# Patient Record
Sex: Male | Born: 1940 | Race: White | Hispanic: No | Marital: Single | Smoking: Former smoker
Health system: Southern US, Community
[De-identification: ages and names within clinical notes are randomized; demographics above are authoritative.]

## PROBLEM LIST (undated history)

## (undated) DIAGNOSIS — R0689 Other abnormalities of breathing: Secondary | ICD-10-CM

## (undated) DIAGNOSIS — I5032 Chronic diastolic (congestive) heart failure: Secondary | ICD-10-CM

## (undated) DIAGNOSIS — I482 Chronic atrial fibrillation, unspecified: Secondary | ICD-10-CM

## (undated) DIAGNOSIS — K219 Gastro-esophageal reflux disease without esophagitis: Secondary | ICD-10-CM

## (undated) DIAGNOSIS — E119 Type 2 diabetes mellitus without complications: Secondary | ICD-10-CM

## (undated) DIAGNOSIS — N183 Chronic kidney disease, stage 3 unspecified: Secondary | ICD-10-CM

## (undated) DIAGNOSIS — I2510 Atherosclerotic heart disease of native coronary artery without angina pectoris: Secondary | ICD-10-CM

## (undated) DIAGNOSIS — I119 Hypertensive heart disease without heart failure: Secondary | ICD-10-CM

## (undated) DIAGNOSIS — N200 Calculus of kidney: Secondary | ICD-10-CM

## (undated) DIAGNOSIS — M1990 Unspecified osteoarthritis, unspecified site: Secondary | ICD-10-CM

## (undated) DIAGNOSIS — K7682 Hepatic encephalopathy: Secondary | ICD-10-CM

## (undated) DIAGNOSIS — E785 Hyperlipidemia, unspecified: Secondary | ICD-10-CM

## (undated) DIAGNOSIS — E8729 Other acidosis: Secondary | ICD-10-CM

## (undated) DIAGNOSIS — K7290 Hepatic failure, unspecified without coma: Secondary | ICD-10-CM

## (undated) DIAGNOSIS — E872 Acidosis: Secondary | ICD-10-CM

## (undated) DIAGNOSIS — J9611 Chronic respiratory failure with hypoxia: Secondary | ICD-10-CM

## (undated) HISTORY — DX: Other abnormalities of breathing: R06.89

## (undated) HISTORY — DX: Chronic diastolic (congestive) heart failure: I50.32

## (undated) HISTORY — DX: Hepatic encephalopathy: K76.82

## (undated) HISTORY — DX: Gastro-esophageal reflux disease without esophagitis: K21.9

## (undated) HISTORY — DX: Hepatic failure, unspecified without coma: K72.90

## (undated) HISTORY — DX: Hyperlipidemia, unspecified: E78.5

## (undated) HISTORY — DX: Acidosis: E87.2

## (undated) HISTORY — DX: Atherosclerotic heart disease of native coronary artery without angina pectoris: I25.10

## (undated) HISTORY — DX: Unspecified osteoarthritis, unspecified site: M19.90

## (undated) HISTORY — PX: OTHER SURGICAL HISTORY: SHX169

## (undated) HISTORY — DX: Other acidosis: E87.29

---

## 2007-02-26 ENCOUNTER — Emergency Department (HOSPITAL_COMMUNITY): Admission: EM | Admit: 2007-02-26 | Discharge: 2007-02-26 | Payer: Self-pay | Admitting: Emergency Medicine

## 2007-07-12 HISTORY — PX: CORONARY ARTERY BYPASS GRAFT: SHX141

## 2007-07-12 HISTORY — PX: CARDIAC CATHETERIZATION: SHX172

## 2007-09-07 ENCOUNTER — Encounter: Payer: Self-pay | Admitting: Cardiovascular Disease

## 2007-09-11 ENCOUNTER — Encounter: Payer: Self-pay | Admitting: Cardiovascular Disease

## 2007-09-19 ENCOUNTER — Ambulatory Visit: Payer: Self-pay | Admitting: Cardiovascular Disease

## 2007-09-19 ENCOUNTER — Encounter: Payer: Self-pay | Admitting: Cardiovascular Disease

## 2009-02-04 ENCOUNTER — Encounter: Payer: Self-pay | Admitting: Cardiovascular Disease

## 2009-02-19 ENCOUNTER — Ambulatory Visit: Payer: Self-pay | Admitting: Urology

## 2009-07-11 HISTORY — PX: COLONOSCOPY: SHX174

## 2009-12-31 ENCOUNTER — Ambulatory Visit: Payer: Self-pay | Admitting: Gastroenterology

## 2010-02-03 ENCOUNTER — Ambulatory Visit: Payer: Self-pay | Admitting: Gastroenterology

## 2010-03-03 ENCOUNTER — Ambulatory Visit: Payer: Self-pay | Admitting: Cardiovascular Disease

## 2010-03-03 DIAGNOSIS — I10 Essential (primary) hypertension: Secondary | ICD-10-CM

## 2010-03-03 DIAGNOSIS — I4891 Unspecified atrial fibrillation: Secondary | ICD-10-CM | POA: Insufficient documentation

## 2010-03-03 DIAGNOSIS — I25810 Atherosclerosis of coronary artery bypass graft(s) without angina pectoris: Secondary | ICD-10-CM

## 2010-03-03 DIAGNOSIS — E785 Hyperlipidemia, unspecified: Secondary | ICD-10-CM

## 2010-03-09 ENCOUNTER — Telehealth: Payer: Self-pay | Admitting: Cardiovascular Disease

## 2010-03-10 ENCOUNTER — Encounter: Payer: Self-pay | Admitting: Cardiovascular Disease

## 2010-03-10 ENCOUNTER — Ambulatory Visit: Payer: Self-pay | Admitting: Cardiology

## 2010-03-10 LAB — CONVERTED CEMR LAB: POC INR: 3.2

## 2010-03-18 ENCOUNTER — Ambulatory Visit: Payer: Self-pay | Admitting: Cardiovascular Disease

## 2010-03-18 DIAGNOSIS — R0602 Shortness of breath: Secondary | ICD-10-CM

## 2010-03-18 LAB — CONVERTED CEMR LAB: POC INR: 6.6

## 2010-03-23 ENCOUNTER — Telehealth: Payer: Self-pay | Admitting: Cardiovascular Disease

## 2010-03-29 ENCOUNTER — Ambulatory Visit: Payer: Self-pay | Admitting: Cardiovascular Disease

## 2010-03-30 ENCOUNTER — Ambulatory Visit: Payer: Self-pay

## 2010-03-30 ENCOUNTER — Encounter: Payer: Self-pay | Admitting: Cardiovascular Disease

## 2010-04-07 ENCOUNTER — Ambulatory Visit: Payer: Self-pay | Admitting: Cardiology

## 2010-04-12 ENCOUNTER — Ambulatory Visit: Payer: Self-pay | Admitting: Cardiovascular Disease

## 2010-04-12 DIAGNOSIS — R609 Edema, unspecified: Secondary | ICD-10-CM | POA: Insufficient documentation

## 2010-04-12 LAB — CONVERTED CEMR LAB: POC INR: 3.8

## 2010-04-21 ENCOUNTER — Ambulatory Visit: Payer: Self-pay | Admitting: Cardiovascular Disease

## 2010-04-23 LAB — CONVERTED CEMR LAB
BUN: 12 mg/dL (ref 6–23)
CO2: 28 meq/L (ref 19–32)
Chloride: 100 meq/L (ref 96–112)
Creatinine, Ser: 1.24 mg/dL (ref 0.40–1.50)
Glucose, Bld: 102 mg/dL — ABNORMAL HIGH (ref 70–99)

## 2010-04-27 ENCOUNTER — Telehealth: Payer: Self-pay | Admitting: Cardiovascular Disease

## 2010-04-28 ENCOUNTER — Ambulatory Visit: Payer: Self-pay | Admitting: Internal Medicine

## 2010-04-30 ENCOUNTER — Encounter: Payer: Self-pay | Admitting: Cardiovascular Disease

## 2010-05-05 ENCOUNTER — Ambulatory Visit: Payer: Self-pay | Admitting: Cardiovascular Disease

## 2010-05-06 ENCOUNTER — Telehealth: Payer: Self-pay | Admitting: Cardiovascular Disease

## 2010-05-07 ENCOUNTER — Ambulatory Visit: Payer: Self-pay | Admitting: Cardiovascular Disease

## 2010-05-07 ENCOUNTER — Encounter: Payer: Self-pay | Admitting: Cardiovascular Disease

## 2010-05-07 ENCOUNTER — Telehealth: Payer: Self-pay | Admitting: Cardiovascular Disease

## 2010-05-11 ENCOUNTER — Encounter: Payer: Self-pay | Admitting: Cardiovascular Disease

## 2010-05-12 ENCOUNTER — Ambulatory Visit: Payer: Self-pay | Admitting: Cardiovascular Disease

## 2010-05-13 ENCOUNTER — Encounter: Payer: Self-pay | Admitting: Cardiovascular Disease

## 2010-05-19 ENCOUNTER — Ambulatory Visit: Payer: Self-pay | Admitting: Cardiology

## 2010-05-19 DIAGNOSIS — I501 Left ventricular failure: Secondary | ICD-10-CM

## 2010-06-01 ENCOUNTER — Encounter: Payer: Self-pay | Admitting: Cardiovascular Disease

## 2010-06-01 ENCOUNTER — Ambulatory Visit: Payer: Self-pay | Admitting: Cardiology

## 2010-06-02 ENCOUNTER — Ambulatory Visit: Payer: Self-pay | Admitting: Cardiovascular Disease

## 2010-06-02 LAB — CONVERTED CEMR LAB
BUN: 20 mg/dL (ref 6–23)
CO2: 35 meq/L — ABNORMAL HIGH (ref 19–32)
Glucose, Bld: 125 mg/dL — ABNORMAL HIGH (ref 70–99)
Potassium: 4.6 meq/L (ref 3.5–5.3)
Pro B Natriuretic peptide (BNP): 83.7 pg/mL (ref 0.0–100.0)
Sodium: 140 meq/L (ref 135–145)

## 2010-06-08 ENCOUNTER — Encounter: Payer: Self-pay | Admitting: Cardiology

## 2010-06-08 ENCOUNTER — Ambulatory Visit: Payer: Self-pay

## 2010-06-16 ENCOUNTER — Ambulatory Visit: Payer: Self-pay | Admitting: Cardiovascular Disease

## 2010-06-29 ENCOUNTER — Encounter: Payer: Self-pay | Admitting: Cardiovascular Disease

## 2010-06-30 ENCOUNTER — Ambulatory Visit: Payer: Self-pay | Admitting: Cardiovascular Disease

## 2010-06-30 LAB — CONVERTED CEMR LAB: POC INR: 1.7

## 2010-07-14 ENCOUNTER — Ambulatory Visit: Admission: RE | Admit: 2010-07-14 | Discharge: 2010-07-14 | Payer: Self-pay | Source: Home / Self Care

## 2010-07-14 ENCOUNTER — Ambulatory Visit
Admission: RE | Admit: 2010-07-14 | Discharge: 2010-07-14 | Payer: Self-pay | Source: Home / Self Care | Attending: Cardiovascular Disease | Admitting: Cardiovascular Disease

## 2010-07-14 LAB — CONVERTED CEMR LAB: POC INR: 1.3

## 2010-07-21 ENCOUNTER — Ambulatory Visit: Admission: RE | Admit: 2010-07-21 | Discharge: 2010-07-21 | Payer: Self-pay | Source: Home / Self Care

## 2010-07-30 ENCOUNTER — Telehealth (INDEPENDENT_AMBULATORY_CARE_PROVIDER_SITE_OTHER): Payer: Self-pay | Admitting: *Deleted

## 2010-08-04 ENCOUNTER — Telehealth: Payer: Self-pay | Admitting: Cardiovascular Disease

## 2010-08-04 ENCOUNTER — Encounter: Payer: Self-pay | Admitting: Cardiovascular Disease

## 2010-08-04 ENCOUNTER — Ambulatory Visit: Admission: RE | Admit: 2010-08-04 | Discharge: 2010-08-04 | Payer: Self-pay | Source: Home / Self Care

## 2010-08-04 LAB — CONVERTED CEMR LAB: POC INR: 3.4

## 2010-08-08 LAB — CONVERTED CEMR LAB
BUN: 17 mg/dL (ref 6–23)
Basophils Relative: 0 % (ref 0–1)
CO2: 32 meq/L (ref 19–32)
Chloride: 99 meq/L (ref 96–112)
Creatinine, Ser: 1.22 mg/dL (ref 0.40–1.50)
Eosinophils Absolute: 0.2 10*3/uL (ref 0.0–0.7)
HCT: 39.3 % (ref 39.0–52.0)
Hemoglobin: 11.5 g/dL — ABNORMAL LOW (ref 13.0–17.0)
Lymphs Abs: 0.9 10*3/uL (ref 0.7–4.0)
MCHC: 29.3 g/dL — ABNORMAL LOW (ref 30.0–36.0)
MCV: 81.5 fL (ref 78.0–100.0)
Monocytes Absolute: 0.8 10*3/uL (ref 0.1–1.0)
Monocytes Relative: 10 % (ref 3–12)
Potassium: 4.5 meq/L (ref 3.5–5.3)
RBC: 4.82 M/uL (ref 4.22–5.81)
WBC: 8 10*3/uL (ref 4.0–10.5)
aPTT: 42 s — ABNORMAL HIGH (ref 24–37)

## 2010-08-12 NOTE — Medication Information (Signed)
Summary: rov/ewj  Anticoagulant Therapy  Managed by: Cloyde Reams, RN, BSN Referring MD: Dr Mariah Milling PCP: Dr. Tommie Sams MD: Shirlee Latch MD, Freida Busman Indication 1: atrial Fibrillation 427.31 Lab Used: [facility] [place] Site: [place] INR POC 6.6 INR RANGE 2.0-3.0  Dietary changes: no     Bleeding/hemorrhagic complications: no     Any changes in medication regimen? no     Any missed doses?: no       Is patient compliant with meds? yes       Allergies: No Known Drug Allergies  Anticoagulation Management History:      The patient is taking warfarin and comes in today for a routine follow up visit.  Positive risk factors for bleeding include an age of 69 years or older.  The bleeding index is 'intermediate risk'.  Positive CHADS2 values include History of HTN.  Negative CHADS2 values include Age > 38 years old.  Anticoagulation responsible provider: Shirlee Latch MD, [place].  INR POC: 6.6.  Exp: 04/2011.    Anticoagulation Management Assessment/Plan:      The patient's current anticoagulation dose is Warfarin sodium 5 mg tabs: Use as directed by Anticoagulation Clinic.  The target INR is 2.0-3.0.  The next INR is due 03/29/2010.  Results were reviewed/authorized by Cloyde Reams, RN, BSN.  He was notified by Benedict Needy, RN.         Prior Anticoagulation Instructions: INR 3.2  Skip tomorrow's dosage of Coumadin, then start taking 1 tablet daily except 1/2 tablet on Mondays and Fridays.  Recheck in 1 week.    Current Anticoagulation Instructions: INR 6.6  DO NOT TAKE COUMADIN UNTIL MONDAY. Start taking 1/2 tab daily except for 1 tab on Tuesday and Saturday. Recheck in 10 days.

## 2010-08-12 NOTE — Medication Information (Signed)
Summary: ccr/ekg/alt  Anticoagulant Therapy  Managed by: Cloyde Reams, RN, BSN Referring MD: Dr Mariah Milling PCP: Dr. Tommie Sams MD: Shirlee Latch MD, Freida Busman Indication 1: atrial Fibrillation 427.31 Lab Used: [facility] [place] Site: [place] INR POC 3.2 INR RANGE 2.0-3.0          Comments: Started on Coumadin 5mg  daily on 03/03/10.    Allergies: No Known Drug Allergies  Anticoagulation Management History:      The patient is taking warfarin and comes in today for a routine follow up visit.  Positive risk factors for bleeding include an age of 76 years or older.  The bleeding index is 'intermediate risk'.  Positive CHADS2 values include History of HTN.  Negative CHADS2 values include Age > 72 years old.  Anticoagulation responsible provider: Shirlee Latch MD, [place].  INR POC: 3.2.  Cuvette Lot#: [id number].  Exp: 04/2011.    Anticoagulation Management Assessment/Plan:      The patient's current anticoagulation dose is Warfarin sodium 5 mg tabs: Use as directed by Anticoagulation Clinic.  The target INR is 2.0-3.0.  The next INR is due 03/18/2010.  Results were reviewed/authorized by Cloyde Reams, RN, BSN.  He was notified by Cloyde Reams RN.         Current Anticoagulation Instructions: INR 3.2  Skip tomorrow's dosage of Coumadin, then start taking 1 tablet daily except 1/2 tablet on Mondays and Fridays.  Recheck in 1 week.

## 2010-08-12 NOTE — Medication Information (Signed)
Summary: rov/ewj  Anticoagulant Therapy  Managed by: Inactive Referring MD: Dr Mariah Milling PCP: Dr. Glenis Smoker Supervising MD: Gala Romney MD, Reuel Boom Indication 1: atrial Fibrillation 427.31 Lab Used: [facility] [place] Site: [place] INR POC 3.4 INR RANGE 2.0-3.0  Dietary changes: no    Health status changes: no    Bleeding/hemorrhagic complications: no    Recent/future hospitalizations: no    Any changes in medication regimen? no    Recent/future dental: no  Any missed doses?: no       Is patient compliant with meds? yes      Comments: Pt had EKG done in office today, pt is in NSR.  OK per Dr Mariah Milling to discontinue Coumadin if pt is in NSR.  Pt advised to stop Coumadin.   Allergies: No Known Drug Allergies  Anticoagulation Management History:      The patient is taking warfarin and comes in today for a routine follow up visit.  Positive risk factors for bleeding include an age of 45 years or older.  The bleeding index is 'intermediate risk'.  Positive CHADS2 values include History of CHF and History of HTN.  Negative CHADS2 values include Age > 34 years old.  His last INR was 2.46.  Anticoagulation responsible provider: Libby Goehring MD, Reuel Boom.  INR POC: 3.4.  Cuvette Lot#: [id number].  Exp: 08/2011.    Anticoagulation Management Assessment/Plan:      The patient's current anticoagulation dose is Warfarin sodium 5 mg tabs: Use as directed by Anticoagulation Clinic, Warfarin sodium 2.5 mg tabs: Use as directed by Anticoagualtion Clinic.  The target INR is 2.0-3.0.  The next INR is due 08/04/2010.  Anticoagulation instructions were given to patient.  Results were reviewed/authorized by Inactive.  He was notified by Cloyde Reams RN.         Prior Anticoagulation Instructions: INR 1.8  Take 5mg  today, then start taking 2.5mg  daily except 5mg  on Wednesdays.  Resume eating your green leafy vegetables. Recheck in 2 weeks.    Current Anticoagulation Instructions: INR  3.4  Pt advised to discontinue Coumadin per Dr Mariah Milling.  EKG today in office, pt is in NSR. Cloyde Reams RN  August 04, 2010 3:55 PM

## 2010-08-12 NOTE — Medication Information (Signed)
Summary: rov/ewj  Anticoagulant Therapy  Managed by: Cloyde Reams, RN, BSN Referring MD: Dr Mariah Milling PCP: Dr. Tommie Sams MD: Gala Romney MD, Reuel Boom Indication 1: atrial Fibrillation 427.31 Lab Used: [facility] [place] Site: [place] INR POC 3.2 INR RANGE 2.0-3.0  Dietary changes: no    Health status changes: no    Bleeding/hemorrhagic complications: no    Recent/future hospitalizations: no    Any changes in medication regimen? no    Recent/future dental: no  Any missed doses?: no       Is patient compliant with meds? yes       Allergies: No Known Drug Allergies  Anticoagulation Management History:      The patient is taking warfarin and comes in today for a routine follow up visit.  Positive risk factors for bleeding include an age of 69 years or older.  The bleeding index is 'intermediate risk'.  Positive CHADS2 values include History of HTN.  Negative CHADS2 values include Age > 23 years old.  Anticoagulation responsible Orli Degrave: Bensimhon MD, Reuel Boom.  INR POC: 3.2.  Cuvette Lot#: [id number].  Exp: 05/2011.    Anticoagulation Management Assessment/Plan:      The patient's current anticoagulation dose is Warfarin sodium 5 mg tabs: Use as directed by Anticoagulation Clinic, Warfarin sodium 2.5 mg tabs: Use as directed by Anticoagualtion Clinic.  The target INR is 2.0-3.0.  The next INR is due 05/05/2010.  Results were reviewed/authorized by Cloyde Reams, RN, BSN.  He was notified by Cloyde Reams RN.         Prior Anticoagulation Instructions: INR 3.6  Skip today's dosage of Coumadin then start taking 1 tablet daily except 1/2 tablet on Wednesdays and Saturdays.  Recheck in 2 weeks.    Current Anticoagulation Instructions: INR 3.2  Start taking 1 tablet daily except 1/2 tablet on Mondays, Wednesdays, and Saturdays.  Recheck in 1 week.

## 2010-08-12 NOTE — Medication Information (Signed)
Summary: CCR/AMD  Anticoagulant Therapy  Managed by: Cloyde Reams, RN, BSN Referring MD: Dr Mariah Milling PCP: Dr. Tommie Sams MD: Shirlee Latch MD, Freida Busman Indication 1: atrial Fibrillation 427.31 Lab Used: [facility] [place] Site: [place] INR POC 4.6 INR RANGE 2.0-3.0  Dietary changes: no     Bleeding/hemorrhagic complications: no     Any changes in medication regimen? yes       Details: Started Simvastatin    Any missed doses?: no       Is patient compliant with meds? yes       Allergies: No Known Drug Allergies  Anticoagulation Management History:      The patient is taking warfarin and comes in today for a routine follow up visit.  Positive risk factors for bleeding include an age of 69 years or older.  The bleeding index is 'intermediate risk'.  Positive CHADS2 values include History of HTN.  Negative CHADS2 values include Age > 70 years old.  Anticoagulation responsible provider: Shirlee Latch MD, [place].  INR POC: 4.6.  Cuvette Lot#: [id number].  Exp: 04/2011.    Anticoagulation Management Assessment/Plan:      The patient's current anticoagulation dose is Warfarin sodium 5 mg tabs: Use as directed by Anticoagulation Clinic.  The target INR is 2.0-3.0.  The next INR is due 04/07/2010.  Results were reviewed/authorized by Cloyde Reams, RN, BSN.  He was notified by Benedict Needy, RN.         Prior Anticoagulation Instructions: INR 6.6  DO NOT TAKE COUMADIN UNTIL MONDAY. Start taking 1/2 tab daily except for 1 tab on Tuesday and Saturday. Recheck in 10 days.   Current Anticoagulation Instructions: INR 4.6   DO NOT TAKE COUMADIN MONDAY OR TUESDAY.  Start taking 1/2 tab daily. Recheck in 10 days.

## 2010-08-12 NOTE — Letter (Signed)
Summary: [facility] Office Note   [facility] Office Note   Imported By: Roderic Ovens 06/17/2010 16:13:05  _____________________________________________________________________  External Attachment:    Type:   Image     Comment:   External Document

## 2010-08-12 NOTE — Letter (Signed)
Summary: Cardioversion/[employer] Catering manager at [address]   [place], [place] [address]   Phone: [phone]  Fax: [phone]    Cardioversion 04/30/2010 MRN: [id number]  [facility] [address] [place], [place]  [address]  Dear Mr. Deis, You are scheduled for a Cardioversion on October 28th  with Dr. Mariah Milling.   Please arrive at the [facility] at 6:30 a.m. on the day of your procedure.    1)   DIET:  Nothing to eat or drink after midnight except your medications with a sip of        water  2)   Come to the [facility] office on October 26th for lab work and Coumadin check.  3)   MAKE SURE YOU TAKE YOUR COUMADIN.  4)   A)   DO NOT TAKE these medications before your procedure:    Metformin  B)   YOU MAY TAKE ALL of your remaining medications with a small amount of water.   5)  Must have a responsible person to drive you home.  6)   Bring a current list of your medications and current insurance cards.   * Special Note:  Every effort is made to have your procedure done on time. Occasionally there are emergencies that present themselves at the hospital that may cause delays. Please be patient if a delay does occur.  * If you have any questions after you get home, please call the office at [phone].

## 2010-08-12 NOTE — Assessment & Plan Note (Signed)
Summary: F3M/AMD   Visit Type:  Follow-up Primary Provider:  Dr. Glenis Smoker  CC:  "doing well" denies chest pain, SOB, and and palpitations..  History of Present Illness: [name] is a pleasant 70 year old gentleman with a history of coronary artery disease, CABG x3 at [facility] in 2009, diabetes, catheterization in March 2009 showing patent grafts with elevated pulmonary pressures who developed atrial fibrillation at the beginning of the summer, cardioversion 04/20/2010 who has maintained NSR, seen later in the year for worsening edema, improved on diuresis, presents for routine follow up. H/o medication noncompliance in the past.  he reports that overall he is doing well. His weight continues to trend downward. He used to weigh greater than 230 pounds. Current he weighs 222. He denies any significant shortness of breath. He does have significant left shoulder and hip pain. He does report a recent episode of dizziness lasting for a second or 2 at a time, typically these come on 2-3 times per week. He does not track his blood pressure or heart rate at home. He does continue to have bilateral lower extremity edema which is stable. No chest pain  Stress test in February 2009 showed ischemia in the apical and inferolateral walls with ejection fraction 66%.  Echocardiogram March 2009 showed ejection fraction 50-55%, mild aortic stenosis, mild pulmonary hypertension with right ventricular systolic pressure estimated at 32 mm of mercury, aortic valve area of 1.7 cm, mildly dilated left atrium  Cardiac catheterization March 2009 showed 60% left main disease, 25% LAD disease, 50% circumflex disease, 40% OM1 disease, 30% OM 2 disease, 100% RCA disease, patent bypass grafts with a saphenous vein graft to the OM 2, LIMA to the LAD, graft to the PDA  Old EKG : normal sinus rhythm with rate of 80 beats per minute, no significant ST or T wave changes  Current Medications (verified): 1)  Metformin Hcl 500 Mg  Tabs (Metformin Hcl) .... One Tablet Two Times A Day 2)  Aspir-Low 81 Mg Tbec (Aspirin) .... Take 2 Tablet By Mouth Once A Day 3)  Omeprazole 20 Mg Cpdr (Omeprazole) .... One Tablet Once Daily 4)  Furosemide 40 Mg Tabs (Furosemide) .... One Tablet Once Daily 5)  Klor-Con 20 Meq Pack (Potassium Chloride) .... Mix With Water 6)  Warfarin Sodium 5 Mg Tabs (Warfarin Sodium) .... Use As Directed By Anticoagulation Clinic 7)  Crestor 10 Mg Tabs (Rosuvastatin Calcium) .... 1/2 Tablet At Bedtime 8)  Digoxin 0.125 Mg Tabs (Digoxin) .... Take One Tablet Once Daily 9)  Warfarin Sodium 2.5 Mg Tabs (Warfarin Sodium) .... Use As Directed By Anticoagualtion Clinic 10)  Sotalol Hcl 120 Mg Tabs (Sotalol Hcl) .... One Tablet Two Times A Day 11)  Lisinopril 10 Mg Tabs (Lisinopril) .... Take One Tablet Once Daily  Allergies (verified): No Known Drug Allergies  Review of Systems       The patient complains of peripheral edema.  The patient denies fever, weight loss, weight gain, vision loss, decreased hearing, hoarseness, chest pain, syncope, dyspnea on exertion, prolonged cough, abdominal pain, incontinence, muscle weakness, depression, and enlarged lymph nodes.         Dizzy episodes  Vital Signs:  Patient profile:   70 year old male Height:      69 inches Weight:      222.50 pounds BMI:     32.98 Pulse rate:   64 / minute BP sitting:   138 / 70  (left arm) Cuff size:   regular  Vitals Entered By:  Lysbeth Galas CMA (July 14, 2010 10:05 AM)  Physical Exam  General:  Well developed, well nourished, in no acute distress. Mildly obese Head:  normocephalic and atraumatic Neck:  Neck supple, JVP 8-9 cm. No masses, thyromegaly or abnormal cervical nodes. Lungs:  Clear bilaterally to auscultation and percussion. Heart:  Non-displaced PMI, chest non-tender; regular rate and rhythm, S1, S2 with II/VI SEM RSB, no rubs or gallops. Carotid upstroke normal, no bruit.  Pedals normal pulses. 1+ pitting  edema  b/l LE, no varicosities. Abdomen:  Bowel sounds positive; abdomen soft and non-tender without masses Msk:  Back normal, normal gait. Muscle strength and tone normal. Pulses:  pulses normal in all 4 extremities Extremities:  No clubbing or cyanosis. Neurologic:  Alert and oriented x 3. Skin:  Intact without lesions or rashes. Psych:  depressed affect.     Impression & Recommendations:  Problem # 1:  EDEMA (ICD-782.3) chronic edema is likely secondary to venous insufficiency. BNP has been normal. Echocardiogram has showed normal right ventricular systolic pressures. We have suggested that he stay on his current dose of Lasix. He does miss a day here and there  Problem # 2:  SHORTNESS OF BREATH (ICD-786.05) shortness of breath has improved with weight loss and diuresis. If encouraged him to continue to exercise as he has had mild weight loss.  His updated medication list for this problem includes:    Aspir-low 81 Mg Tbec (Aspirin) .Marland Kitchen... Take 2 tablet by mouth once a day    Furosemide 40 Mg Tabs (Furosemide) ..... One tablet once daily    Digoxin 0.125 Mg Tabs (Digoxin) .Marland Kitchen... Take one tablet once daily    Sotalol Hcl 120 Mg Tabs (Sotalol hcl) ..... One tablet two times a day    Lisinopril 10 Mg Tabs (Lisinopril) .Marland Kitchen... Take one tablet once daily  Problem # 3:  HYPERTENSION, BENIGN (ICD-401.1) Blood pressure is well to be well controlled on his current medication regimen. He does report very short episodes of dizziness. I asked him to monitor his heart rate and blood pressure at home and provide [place] with these numbers.  His updated medication list for this problem includes:    Aspir-low 81 Mg Tbec (Aspirin) .Marland Kitchen... Take 2 tablet by mouth once a day    Furosemide 40 Mg Tabs (Furosemide) ..... One tablet once daily    Sotalol Hcl 120 Mg Tabs (Sotalol hcl) ..... One tablet two times a day    Lisinopril 10 Mg Tabs (Lisinopril) .Marland Kitchen... Take one tablet once daily  Problem # 4:  HYPERLIPIDEMIA-MIXED  (ICD-272.4) He is taking Crestor 10 mg daily. We have asked him to have his cholesterol checked in 2 months time with LFTs.  His updated medication list for this problem includes:    Crestor 10 Mg Tabs (Rosuvastatin calcium) .Marland Kitchen... 1 tablet at bedtime  Problem # 5:  CAD (ICD-414.00) No symptoms of chest discomfort or angina at this time. Continue aggressive medical management.  His updated medication list for this problem includes:    Aspir-low 81 Mg Tbec (Aspirin) .Marland Kitchen... Take 2 tablet by mouth once a day    Warfarin Sodium 5 Mg Tabs (Warfarin sodium) ..... Use as directed by anticoagulation clinic    Warfarin Sodium 2.5 Mg Tabs (Warfarin sodium) ..... Use as directed by anticoagualtion clinic    Sotalol Hcl 120 Mg Tabs (Sotalol hcl) ..... One tablet two times a day    Lisinopril 10 Mg Tabs (Lisinopril) .Marland Kitchen... Take one tablet once daily  Problem #  6:  ATRIAL FIBRILLATION (ICD-427.31) Maintaining sinus rhythm on sotalol by clinical exam. He does have rare episodes of dizziness. Last him to monitor his heart rate and blood pressure. If he is having bradycardia or hypotension, we can adjust his medications accordingly.  INR was low today and has been adjusted accordingly.  His updated medication list for this problem includes:    Aspir-low 81 Mg Tbec (Aspirin) .Marland Kitchen... Take 2 tablet by mouth once a day    Warfarin Sodium 5 Mg Tabs (Warfarin sodium) ..... Use as directed by anticoagulation clinic    Digoxin 0.125 Mg Tabs (Digoxin) .Marland Kitchen... Take one tablet once daily    Warfarin Sodium 2.5 Mg Tabs (Warfarin sodium) ..... Use as directed by anticoagualtion clinic    Sotalol Hcl 120 Mg Tabs (Sotalol hcl) ..... One tablet two times a day  Patient Instructions: 1)  Your physician recommends that you schedule a follow-up appointment in: 6 months. 2)  Your physician recommends that you return for lab work in: 2 months for :Liv/Lip

## 2010-08-12 NOTE — Progress Notes (Signed)
Summary: Question about greens  Phone Note Call from Patient Call back at [phone]   Caller: Daughter [name]) Call For: Christian [name] Summary of Call: Concerned about what the pt can and cannot eat as far as green vegetables because of Coumadin. Initial call taken by: Harlon Flor,  July 30, 2010 1:32 PM  Follow-up for Phone Call        Attempted TCB.  LMOM TCB. Cloyde Reams RN  August 02, 2010 10:54 AM   Spoke with pt's daughter [name] advised there are no green leafy vegetables that are high in vitamin K that are "off limits" to eat.  All foods high in vitamin K can be eaten they just need to be eaten with consistancy in the diet.  Pt is coming into the office on 08/04/10 and I will give him a list of foods that are high in vitamin K that he should keep amounts of consistant in his diet.  Follow-up by: Cloyde Reams RN,  August 03, 2010 8:07 AM

## 2010-08-12 NOTE — Medication Information (Signed)
Summary: rov/tm  Anticoagulant Therapy  Managed by: Cloyde Reams, RN, BSN Referring MD: Dr Mariah Milling PCP: Dr. Tommie Sams MD: Mariah Milling Indication 1: atrial Fibrillation 427.31 Lab Used: [facility] [place] Site: [place] INR POC 1.7 INR RANGE 2.0-3.0  Dietary changes: no    Health status changes: no    Bleeding/hemorrhagic complications: no    Recent/future hospitalizations: no    Any changes in medication regimen? no    Recent/future dental: no  Any missed doses?: no       Is patient compliant with meds? yes       Allergies: No Known Drug Allergies  Anticoagulation Management History:      The patient is taking warfarin and comes in today for a routine follow up visit.  Positive risk factors for bleeding include an age of 69 years or older.  The bleeding index is 'intermediate risk'.  Positive CHADS2 values include History of CHF and History of HTN.  Negative CHADS2 values include Age > 69 years old.  His last INR was 2.46.  Anticoagulation responsible provider: [place].  INR POC: 1.7.  Exp: 12/20212.    Anticoagulation Management Assessment/Plan:      The patient's current anticoagulation dose is Warfarin sodium 5 mg tabs: Use as directed by Anticoagulation Clinic, Warfarin sodium 2.5 mg tabs: Use as directed by Anticoagualtion Clinic.  The target INR is 2.0-3.0.  The next INR is due 07/14/2010.  Anticoagulation instructions were given to patient.  Results were reviewed/authorized by Cloyde Reams, RN, BSN.  He was notified by Cloyde Reams RN.         Prior Anticoagulation Instructions: INR 1.4 Today take 1.5 pills then change dose to 1 pill everyday except 1/2 pill only on Mondays and Wednesdays. Recheck in 2 weeks.   Current Anticoagulation Instructions: INR 1.7  Start taking 1 tablet daily except 1/2 tablet on Mondays.  Recheck in 2 weeks.

## 2010-08-12 NOTE — Progress Notes (Signed)
Summary: Discontinue Coumadin  ---- Converted from flag ---- ---- 07/28/2010 2:36 PM, Cloyde Reams RN wrote:   ---- 07/28/2010 10:06 AM, Dossie Arbour MD wrote: Can we have him do an EKG on next coumadin check. If NSR, would be ok to stop coumadin. Thx  ---- 07/21/2010 3:05 PM, Cloyde Reams RN wrote: Pt was successfully DCCV on 05/07/10, maintaining NSR.  Pt last seen 07/14/10, next f/u in 6 months.  Pt inquiring how long he will need to stay on Coumadin.  Please advise. Thanks. ------------------------------  Phone Note Other Incoming   Summary of Call: Pt came into office today for Coumadin check, EKG done at [facility], pt in NSR advised pt OK to d/c Coumadin per Dr Mariah Milling.  Initial call taken by: Cloyde Reams RN,  August 04, 2010 3:53 PM

## 2010-08-12 NOTE — Medication Information (Signed)
Summary: rov/ewj  Anticoagulant Therapy  Managed by: Cloyde Reams, RN, BSN Referring MD: Dr Mariah Milling PCP: Dr. Tommie Sams MD: Mariah Milling Indication 1: atrial Fibrillation 427.31 Lab Used: [facility] [place] Site: [place] INR POC 2.8 INR RANGE 2.0-3.0  Dietary changes: no    Health status changes: no    Bleeding/hemorrhagic complications: no    Recent/future hospitalizations: no    Any changes in medication regimen? no    Recent/future dental: no  Any missed doses?: no       Is patient compliant with meds? yes      Comments: Pending TEE DCCV on 05/07/10  Allergies: No Known Drug Allergies  Anticoagulation Management History:      The patient is taking warfarin and comes in today for a routine follow up visit.  Positive risk factors for bleeding include an age of 69 years or older.  The bleeding index is 'intermediate risk'.  Positive CHADS2 values include History of HTN.  Negative CHADS2 values include Age > 75 years old.  Anticoagulation responsible Daoud Lobue: Goll[facility].  INR POC: 2.8.  Cuvette Lot#: [id number].  Exp: 05/2011.    Anticoagulation Management Assessment/Plan:      The patient's current anticoagulation dose is Warfarin sodium 5 mg tabs: Use as directed by Anticoagulation Clinic, Warfarin sodium 2.5 mg tabs: Use as directed by Anticoagualtion Clinic.  The target INR is 2.0-3.0.  The next INR is due 05/19/2010.  Results were reviewed/authorized by Cloyde Reams, RN, BSN.  He was notified by Cloyde Reams RN.         Prior Anticoagulation Instructions: INR 3.2  Start taking 1 tablet daily except 1/2 tablet on Mondays, Wednesdays, and Saturdays.  Recheck in 1 week.    Current Anticoagulation Instructions: INR 2.8  Continue on same dosage 1 tablet daily except 1/2 tablet on Mondays, Wednesdays, and Saturdays.  Recheck in 2 weeks.    Appended Document: Orders Update    Clinical Lists Changes  Problems: Added new problem of PREOPERATIVE  EXAMINATION (ICD-V72.84) Orders: Added new Test order of T-2 View CXR ([id number]) - Signed Added new Test order of T-Basic Metabolic Panel [id number]) - Signed Added new Test order of T-CBC w/Diff [id number]) - Signed Added new Test order of T-PTT ([id number]) - Signed Added new Test order of T-Protime, Auto ([id number]) - Signed

## 2010-08-12 NOTE — Progress Notes (Signed)
Summary: MEDICATION CHANGES  Phone Note Call from Patient Call back at [phone]   Caller: DAUGHTER [facility]) Call For: [facility] Summary of Call: WAS TOLD TO HOLD OFF ON COREG-IS THERE ANOTHER NAME FOR THIS? DAUGTHER CANNOT FIND THIS MEDICATION IN HER DAD'S MEDICATIONS. Initial call taken by: Harlon Flor,  May 07, 2010 3:10 PM  Follow-up for Phone Call        Called spoke with pt's daughter [name] advised Carvedilol is the generic name for Coreg. Pt mistakenly bought COQ10 300mg  tablets is this OK to take instead of 100mg  tablets as on pt's med list. Please advise. Thanks Follow-up by: Cloyde Reams RN,  May 07, 2010 4:12 PM  Additional Follow-up for Phone Call Additional follow up Details #1::        Would go back to lower doe of coenz Q 10.  It is ok.     Appended Document: MEDICATION CHANGES LMOM TCB   Appended Document: MEDICATION CHANGES Pt's daughter aware

## 2010-08-12 NOTE — Progress Notes (Signed)
Summary: cholesterol change  Phone Note Call from Patient   Caller: Patient Details for Reason: Can't tolerate crestor Summary of Call: Patient can't tolerate the crestor.  He would like to know if should go back on simvastatin? Initial call taken by: Bishop Dublin, CMA,  March 23, 2010 4:36 PM  Follow-up for Phone Call        would try a lower dose crestor? 5 mg daily.  Or start simvastatin 20 mg to start. Would need to titrate upwards later.     Appended Document: cholesterol change Called pt back, to advise of Dr Windell Hummingbird recommendations about trying to lower the dose of Crestor, d/t intolerance, and pt states he did not call about his Crestor.  Verified pt's name and DOB, but he states he did not call. EWJ

## 2010-08-12 NOTE — Progress Notes (Signed)
Summary: Gym membership  Phone Note Call from Patient   Caller: Patient Call For: [facility] Summary of Call: Pt would like to know if you would write a letter to stop pt's gym membership at the [employer]. Please advise.  Initial call taken by: Benedict Needy, RN,  May 06, 2010 2:41 PM  Follow-up for Phone Call        talked to him. he will try gym again

## 2010-08-12 NOTE — Medication Information (Signed)
Summary: rov/ewj  Anticoagulant Therapy  Managed by: Bethena Midget, RN, BSN Referring MD: Dr Mariah Milling PCP: Dr. Tommie Sams MD: Mariah Milling Indication 1: atrial Fibrillation 427.31 Lab Used: [facility] [place] Site: [place] INR POC 1.4 INR RANGE 2.0-3.0  Dietary changes: no    Health status changes: no    Bleeding/hemorrhagic complications: no    Recent/future hospitalizations: no    Any changes in medication regimen? no    Recent/future dental: no  Any missed doses?: no       Is patient compliant with meds? yes       Allergies: No Known Drug Allergies  Anticoagulation Management History:      The patient is taking warfarin and comes in today for a routine follow up visit.  Positive risk factors for bleeding include an age of 69 years or older.  The bleeding index is 'intermediate risk'.  Positive CHADS2 values include History of CHF and History of HTN.  Negative CHADS2 values include Age > 69 years old.  His last INR was 2.46.  Anticoagulation responsible provider: Gollan.  INR POC: 1.4.  Cuvette Lot#: [id number].  Exp: 12/20212.    Anticoagulation Management Assessment/Plan:      The patient's current anticoagulation dose is Warfarin sodium 5 mg tabs: Use as directed by Anticoagulation Clinic, Warfarin sodium 2.5 mg tabs: Use as directed by Anticoagualtion Clinic.  The target INR is 2.0-3.0.  The next INR is due 06/30/2010.  Anticoagulation instructions were given to patient.  Results were reviewed/authorized by Bethena Midget, RN, BSN.  He was notified by Bethena Midget, RN, BSN.         Prior Anticoagulation Instructions: INR 1.4  Today take 1 1/2  pills then resume 1 pill everyday except 1/2 pill on Mondays, Wednesdays and Fridays. Recheck in 2 weeks.   Current Anticoagulation Instructions: INR 1.4 Today take 1.5 pills then change dose to 1 pill everyday except 1/2 pill only on Mondays and Wednesdays. Recheck in 2 weeks.

## 2010-08-12 NOTE — Medication Information (Signed)
Summary: CCR/AMD  Anticoagulant Therapy  Managed by: Cloyde Reams, RN, BSN Referring MD: Dr Mariah Milling PCP: Dr. Tommie Sams MD: Mariah Milling Indication 1: atrial Fibrillation 427.31 Lab Used: [facility] [place] Site: [place] INR POC 3.6 INR RANGE 2.0-3.0  Dietary changes: no    Health status changes: no    Bleeding/hemorrhagic complications: no    Recent/future hospitalizations: no    Any changes in medication regimen? no    Recent/future dental: no  Any missed doses?: no       Is patient compliant with meds? yes       Allergies: No Known Drug Allergies  Anticoagulation Management History:      The patient is taking warfarin and comes in today for a routine follow up visit.  Positive risk factors for bleeding include an age of 35 years or older.  The bleeding index is 'intermediate risk'.  Positive CHADS2 values include History of HTN.  Negative CHADS2 values include Age > 76 years old.  Anticoagulation responsible provider: Gollan.  INR POC: 3.6.  Cuvette Lot#: [id number].  Exp: 05/2011.    Anticoagulation Management Assessment/Plan:      The patient's current anticoagulation dose is Warfarin sodium 5 mg tabs: Use as directed by Anticoagulation Clinic, Warfarin sodium 2.5 mg tabs: Use as directed by Anticoagualtion Clinic.  The target INR is 2.0-3.0.  The next INR is due 05/05/2010.  Results were reviewed/authorized by Cloyde Reams, RN, BSN.  He was notified by Cloyde Reams RN.         Prior Anticoagulation Instructions: INR 3.8  Skip todays dose of coumadin, then Start taking 2.5mg  1 tabs daily except for 1/2 tab on Wedensday. Recheck in 1 week.   Current Anticoagulation Instructions: INR 3.6  Skip today's dosage of Coumadin then start taking 1 tablet daily except 1/2 tablet on Wednesdays and Saturdays.  Recheck in 2 weeks.

## 2010-08-12 NOTE — Cardiovascular Report (Signed)
[facility]   Imported By: Harlon Flor 03/12/2010 11:45:08  _____________________________________________________________________  External Attachment:    Type:   Image     Comment:   External Document

## 2010-08-12 NOTE — Letter (Signed)
Summary: PHI  PHI   Imported By: Harlon Flor 03/12/2010 11:49:13  _____________________________________________________________________  External Attachment:    Type:   Image     Comment:   External Document

## 2010-08-12 NOTE — Assessment & Plan Note (Signed)
Summary: rov/alt   Visit Type:  Follow-up Primary Provider:  Dr. Glenis Smoker  CC:  Still having shortness of breath..  History of Present Illness: Christian [name] is a pleasant 70 year old gentleman with a history of coronary artery disease, CABG x3 at [facility] in 2009, diabetes, catheterization in March 2009 showing patent grafts with elevated pulmonary pressures who Developed atrial fibrillation at the beginning of the summer who presented only recently several weeks ago for evaluation.  On his last visit, we doubled his Coreg to 6.25 mg b.i.d., started him on diltiazem 120 mg daily for rate control. We also started him on warfarin. We gave him a prescription for Lasix 20 mg daily. He reports that he has been taking his Lasix every other day. He has been noncompliant with his Crestor and has not taken this for at least 6 months. He is also not taking Plavix for uncertain reasons.  Stress test in February 2009 showed ischemia in the apical and inferolateral walls with ejection fraction 66%.  Echocardiogram March 2009 showed ejection fraction 50-55%, mild aortic stenosis, mild pulmonary hypertension with right ventricular systolic pressure estimated at 32 mm of mercury, aortic valve area of 1.7 cm, mildly dilated left atrium  Cardiac catheterization March 2009 showed 60% left main disease, 25% LAD disease, 50% circumflex disease, 40% OM1 disease, 30% OM 2 disease, 100% RCA disease, patent bypass grafts with a saphenous vein graft to the OM 2, LIMA to the LAD, graft to the PDA  EKG today shows atrial fibrillation with ventricular rate 112 beats per minute  Current Medications (verified): 1)  Metformin Hcl 500 Mg Tabs (Metformin Hcl) .... One Tablet Two Times A Day 2)  Carvedilol 6.25 Mg Tabs (Carvedilol) .... Take 1 Tablet By Mouth Two Times A Day 3)  Aspir-Low 81 Mg Tbec (Aspirin) .... Take 2 Tablet By Mouth Once A Day 4)  Omeprazole 20 Mg Cpdr (Omeprazole) .... One Tablet Once Daily 5)  Coq10 100 Mg  Caps (Coenzyme Q10) .... One Tablet Once Daily 6)  Furosemide 20 Mg Tabs (Furosemide) .... Take One Tablet By Mouth Daily. 7)  Potassium Chloride Crys Cr 20 Meq Cr-Tabs (Potassium Chloride Crys Cr) .... Take One Tablet By Mouth Daily 8)  Warfarin Sodium 5 Mg Tabs (Warfarin Sodium) .... Use As Directed By Anticoagulation Clinic 9)  Diltiazem Hcl Er Beads 120 Mg Xr24h-Cap (Diltiazem Hcl Er Beads) .... Take One Capsule By Mouth Daily 10)  Crestor 40 Mg Tabs (Rosuvastatin Calcium) .... Take1/2-1 Tablet By Mouth Daily.  Allergies (verified): No Known Drug Allergies  Past History:  Past Medical History: Last updated: 03/03/2010 CAD diabetes Hyperlipidemia Hypertension GERD arthritis  Past Surgical History: Last updated: 03/03/2010 CAD; s/p CABG x 3 @ [facility] in 2009.  Family History: Last updated: 03/03/2010 Family History of Coronary Artery Disease: age 20 (Brother) living.  Social History: Last updated: 03/03/2010 Retired  Single  Tobacco Use - Former.  Alcohol Use - no  Risk Factors: Smoking Status: quit (03/03/2010)  Review of Systems       The patient complains of dyspnea on exertion and peripheral edema.  The patient denies fever, weight loss, weight gain, vision loss, decreased hearing, hoarseness, chest pain, syncope, prolonged cough, abdominal pain, incontinence, muscle weakness, depression, and enlarged lymph nodes.    Vital Signs:  Patient profile:   70 year old male Height:      69 inches Weight:      236 pounds BMI:     34.98 Pulse rate:   60 /  minute BP sitting:   128 / 72  (left arm) Cuff size:   regular  Vitals Entered By: Christian [name], CMA (March 18, 2010 2:26 PM)  Physical Exam  General:  Well developed, well nourished, in no acute distress. Head:  normocephalic and atraumatic Neck:  Neck supple, no JVD. No masses, thyromegaly or abnormal cervical nodes. Lungs:  Crackles at the left base, scant crackles at the right base otherwise  clear Heart:  Non-displaced PMI, chest non-tender; irregular rate and rhythm, S1, S2 without murmurs, rubs or gallops. Carotid upstroke normal, no bruit. Pedals normal pulses. trace to 1+ LE  edema to below the knees, no varicosities. Abdomen:   abdomen soft and non-tender without masses, obese Msk:  Back normal, normal gait. Muscle strength and tone normal. Pulses:  pulses normal in all 4 extremities Extremities:  No clubbing or cyanosis. Neurologic:  Alert and oriented x 3. Skin:  Intact without lesions or rashes. Psych:  Normal affect.   Impression & Recommendations:  Problem # 1:  ATRIAL FIBRILLATION (ICD-427.31) his rate continues to be elevated on diltiazem and Coreg 6.25 mg b.i.d. He continues to be in atrial fibrillation. He is on warfarin with INR that has increased from 3 to 6 today. We will hold his warfarin for 3 days and then change his dosing regimen. He will take 2.5 mg 5 days a week, 5 mg 2 days per week.  For his rate, we'll add digoxin 0.25 mg daily. He continues to have edema and we have suggested he continue Lasix daily. He reports that he is afraid that he does not drink too much and he will get dehydrated. Again there some degree of medication noncompliance as he seems to stop some of his medications without telling us. He is off his statins. He is off Plavix.  His updated medication list for this problem includes:    Carvedilol 6.25 Mg Tabs (Carvedilol) .Marland Kitchen... Take 1 tablet by mouth two times a day    Aspir-low 81 Mg Tbec (Aspirin) .Marland Kitchen... Take 2 tablet by mouth once a day    Warfarin Sodium 5 Mg Tabs (Warfarin sodium) ..... Use as directed by anticoagulation clinic    Digoxin 0.25 Mg Tabs (Digoxin) .Marland Kitchen... Take one tablet by mouth daily  Orders: Echocardiogram (Echo)  Problem # 2:  HYPERTENSION, BENIGN (ICD-401.1) blood pressure is reasonable on his current medications we made no changes.  His updated medication list for this problem includes:    Carvedilol  6.25 Mg Tabs (Carvedilol) .Marland Kitchen... Take 1 tablet by mouth two times a day    Aspir-low 81 Mg Tbec (Aspirin) .Marland Kitchen... Take 2 tablet by mouth once a day    Furosemide 20 Mg Tabs (Furosemide) .Marland Kitchen... Take one tablet by mouth daily.    Diltiazem Hcl Er Beads 120 Mg Xr24h-cap (Diltiazem hcl er beads) .Marland Kitchen... Take one capsule by mouth daily  Problem # 3:  CAD (ICD-414.00) He has a history of severe coronary artery disease. It is uncertain why he is not taking a statin. He did mention something about muscle ache though the muscle ache that he has had recently is not due to the statin as he has not had Crestor in 6 months time. We will try to obtain his most recent lipid panel.  His updated medication list for this problem includes:    Carvedilol 6.25 Mg Tabs (Carvedilol) .Marland Kitchen... Take 1 tablet by mouth two times a day    Aspir-low 81 Mg Tbec (Aspirin) .Marland Kitchen... Take 2 tablet  by mouth once a day    Warfarin Sodium 5 Mg Tabs (Warfarin sodium) ..... Use as directed by anticoagulation clinic    Diltiazem Hcl Er Beads 120 Mg Xr24h-cap (Diltiazem hcl er beads) .Marland Kitchen... Take one capsule by mouth daily  Problem # 4:  SHORTNESS OF BREATH (ICD-786.05) His shortness of breath is likely due to his atrial fibrillation, mild fluid overload from diastolic dysfunction. We have stressed to him the importance of weight control, taking his Lasix on a consistent basis.  His updated medication list for this problem includes:    Carvedilol 6.25 Mg Tabs (Carvedilol) .Marland Kitchen... Take 1 tablet by mouth two times a day    Aspir-low 81 Mg Tbec (Aspirin) .Marland Kitchen... Take 2 tablet by mouth once a day    Furosemide 20 Mg Tabs (Furosemide) .Marland Kitchen... Take one tablet by mouth daily.    Diltiazem Hcl Er Beads 120 Mg Xr24h-cap (Diltiazem hcl er beads) .Marland Kitchen... Take one capsule by mouth daily    Digoxin 0.25 Mg Tabs (Digoxin) .Marland Kitchen... Take one tablet by mouth daily  Orders: Echocardiogram (Echo)  Patient Instructions: 1)  Your physician recommends that you schedule a  follow-up appointment in: 3 weeks.  2)  Your physician has recommended you make the following change in your medication: START digoxin daily  3)  Your physician has requested that you have an echocardiogram.  Echocardiography is a painless test that uses sound waves to create images of your heart. It provides your doctor with information about the size and shape of your heart and how well your heart's chambers and valves are working.  This procedure takes approximately one hour. There are no restrictions for this procedure. Prescriptions: DIGOXIN 0.25 MG TABS (DIGOXIN) Take one tablet by mouth daily  #30 x 4   Entered by:   Benedict Needy, RN   Authorized by:   Dossie Arbour MD   Signed by:   Benedict Needy, RN on 03/18/2010   Method used:   Electronically to        [facility]* (retail)       [address]       [place], [place]  [address]       Ph: [phone]       Fax: [phone]   RxID:   [id number]   Appended Document: rov/alt we have ordered an echocardiogram to determine his left atrial size, rule out worsening aortic valve disease. Echocardiogram over 2 years ago showed mild aortic valve stenosis, borderline elevated right ventricular systolic pressures

## 2010-08-12 NOTE — Assessment & Plan Note (Signed)
Summary: NP6/AMD   Visit Type:  Initial Consult Primary Provider:  Dr. Glenis Smoker  CC:  c/o shortness of breath..  History of Present Illness: [name] is a pleasant 70 year old gentleman with a history of coronary artery disease, CABG x3 at [facility] in 2009, diabetes, catheterization in March 2009 showing patent grafts with elevated pulmonary pressures who presents to reestablish care. He was last seen by myself at [facility] in July 2010.  He reports that over the past 3 months, he has had worsening shortness of breath, abdominal fullness. He has been less active. He believes he might have slightly worse lower extremity edema. He has not been taking his Crestor for past several weeks for uncertain reasons. He does not take furosemide. Is not taking Plavix.  Stress test in February 2009 showed ischemia in the apical and inferolateral walls with ejection fraction 66%.  Echocardiogram March 2009 showed ejection fraction 50-55%, mild aortic stenosis, mild pulmonary hypertension with right ventricular systolic pressure estimated at 32 mm of mercury, aortic valve area of 1.7 cm, mildly dilated left atrium  Cardiac catheterization March 2009 showed 60% left main disease, 25% LAD disease, 50% circumflex disease, 40% OM1 disease, 30% OM 2 disease, 100% RCA disease, patent bypass grafts with a saphenous vein graft to the OM 2, LIMA to the LAD, graft to the PDA  EKG today shows atrial fibrillation with rate of 134 beats per minute, no significant ST or T wave changes. Previous EKG showing normal sinus rhythm with PVCs.  Preventive Screening-Counseling & Management  Alcohol-Tobacco     Smoking Status: quit  Current Medications (verified): 1)  Effient 10 Mg Tabs (Prasugrel Hcl) .... One Tablet Once Daily 2)  Micardis Hct 80-12.5 Mg Tabs (Telmisartan-Hctz) .... One Tablet Once Daily 3)  Metformin Hcl 500 Mg Tabs (Metformin Hcl) .... One Tablet Two Times A Day 4)  Coreg  3.125 Mg Tabs (Carvedilol) .... One Tablet Once Daily 5)  Aspir-Low 81 Mg Tbec (Aspirin) .... One Tablet Once Daily 6)  Omeprazole 20 Mg Cpdr (Omeprazole) .... One Tablet Once Daily 7)  Coq10 100 Mg Caps (Coenzyme Q10) .... One Tablet Once Daily  Allergies (verified): No Known Drug Allergies  Past History:  Family History: Last updated: 03/03/2010 Family History of Coronary Artery Disease: age 23 (Brother) living.  Social History: Last updated: 03/03/2010 Retired  Single  Tobacco Use - Former.  Alcohol Use - no  Risk Factors: Smoking Status: quit (03/03/2010)  Past Medical History: CAD diabetes Hyperlipidemia Hypertension GERD arthritis  Past Surgical History: CAD; s/p CABG x 3 @ [facility] in 2009.  Family History: Family History of Coronary Artery Disease: age 69 (Brother) living.  Social History: Retired  Single  Tobacco Use - Former.  Alcohol Use - no Smoking Status:  quit  Review of Systems       The patient complains of dyspnea on exertion and peripheral edema.  The patient denies fever, weight loss, weight gain, vision loss, decreased hearing, hoarseness, chest pain, syncope, prolonged cough, abdominal pain, incontinence, muscle weakness, depression, and enlarged lymph nodes.         Stomach fullness  Vital Signs:  Patient profile:   70 year old male Height:      69 inches Weight:      236 pounds BMI:     34.98 Pulse rate:   134 / minute BP sitting:   153 / 94  (right arm) Cuff size:   regular  Vitals Entered By: Bishop Dublin,  CMA (March 03, 2010 3:55 PM)  Physical Exam  General:  Well developed, well nourished, in no acute distress. Head:  normocephalic and atraumatic Neck:  Neck supple, no JVD. No masses, thyromegaly or abnormal cervical nodes. Lungs:  Clear bilaterally to auscultation and percussion. Heart:  Non-displaced PMI, chest non-tender; irregular rate and rhythm, S1, S2 without murmurs, rubs or gallops. Carotid upstroke normal, no  bruit. Pedals normal pulses. 1+ LE  edema to below the knees, no varicosities. Abdomen:   abdomen soft and non-tender without masses, obese Msk:  Back normal, normal gait. Muscle strength and tone normal. Pulses:  pulses normal in all 4 extremities Extremities:  No clubbing or cyanosis. Neurologic:  Alert and oriented x 3.   Impression & Recommendations:  Problem # 1:  ATRIAL FIBRILLATION (ICD-427.31) he has new onset atrial with fibrillation, likely presenting approximately 3 months ago when he developed abdominal fullness. He does have some shortness of breath with exertion. He is uncertain if his lower extremity edema is worse.  we will start him on diltiazem CD 120 mg daily, increase his Coreg to 6.25 mg b.i.d. for rate control. We will start him on warfarin 5 mg daily with an INR check in one week with EKG at that time. We will also start him on Lasix 20 mg daily with potassium 20 mEq daily, hold his effient and as he has had no recent PCI, and change him to aspirin 81 mg x2. He is at risk of bleeding as he has severe hemorrhoids.  His updated medication list for this problem includes:    Carvedilol 6.25 Mg Tabs (Carvedilol) .Marland Kitchen... Take 1 tablet by mouth two times a day    Aspir-low 81 Mg Tbec (Aspirin) .Marland Kitchen... Take 2 tablet by mouth once a day    Warfarin Sodium 5 Mg Tabs (Warfarin sodium) ..... Use as directed by anticoagulation clinic  Problem # 2:  HYPERLIPIDEMIA-MIXED (ICD-272.4) He has been off his Crestor and we will restart this at 20 mg daily.  His updated medication list for this problem includes:    Crestor 40 Mg Tabs (Rosuvastatin calcium) .Marland Kitchen... Take1/2-1 tablet by mouth daily.  Problem # 3:  CAD (ICD-414.00) He does have a history of underlying coronary artery disease with cardiac catheterization 2 and half years ago showing patent grafts. He will likely need a repeat echocardiogram once his heart rate slows. If he continues to have problems of shortness of breath,  further workup may be needed such as a stress test.  His updated medication list for this problem includes:    Carvedilol 6.25 Mg Tabs (Carvedilol) .Marland Kitchen... Take 1 tablet by mouth two times a day    Aspir-low 81 Mg Tbec (Aspirin) .Marland Kitchen... Take 2 tablet by mouth once a day    Warfarin Sodium 5 Mg Tabs (Warfarin sodium) ..... Use as directed by anticoagulation clinic    Diltiazem Hcl Er Beads 120 Mg Xr24h-cap (Diltiazem hcl er beads) .Marland Kitchen... Take one capsule by mouth daily  Orders: EKG w/ Interpretation (93000)  Problem # 4:  HYPERTENSION, BENIGN (ICD-401.1) As we are starting diltiazem and increasing his Coreg for his rate control, we will hold his micardis/hct  His updated medication list for this problem includes:    Carvedilol 6.25 Mg Tabs (Carvedilol) .Marland Kitchen... Take 1 tablet by mouth two times a day    Aspir-low 81 Mg Tbec (Aspirin) .Marland Kitchen... Take 2 tablet by mouth once a day    Furosemide 20 Mg Tabs (Furosemide) .Marland Kitchen... Take one tablet by  mouth daily.    Diltiazem Hcl Er Beads 120 Mg Xr24h-cap (Diltiazem hcl er beads) .Marland Kitchen... Take one capsule by mouth daily  Patient Instructions: 1)  Your physician recommends that you schedule a follow-up appointment in: 1 week for coumadin check and EKG and appointment with Dr. Mariah Milling 2 weeks.  2)  Your physician has recommended you make the following change in your medication: STOP effient, micardis. START lasix, potassium, warfarin, diltiazem, crestor Prescriptions: CRESTOR 40 MG TABS (ROSUVASTATIN CALCIUM) Take1/2-1 tablet by mouth daily.  #30 x 4   Entered by:   Benedict Needy, RN   Authorized by:   Dossie Arbour MD   Signed by:   Benedict Needy, RN on 03/03/2010   Method used:   Electronically to        [facility]* (retail)       [address]       [place], [place]  [address]       Ph: [phone]       Fax: [phone]   RxID:   [id number] DILTIAZEM HCL ER BEADS 120 MG XR24H-CAP (DILTIAZEM HCL ER BEADS) Take one capsule by mouth daily  #30 x 4    Entered by:   Benedict Needy, RN   Authorized by:   Dossie Arbour MD   Signed by:   Benedict Needy, RN on 03/03/2010   Method used:   Electronically to        [facility]* (retail)       [address]       [place], [place]  [address]       Ph: [phone]       Fax: [phone]   RxID:   [id number] WARFARIN SODIUM 5 MG TABS (WARFARIN SODIUM) Use as directed by Anticoagulation Clinic  #30 x 1   Entered by:   Benedict Needy, RN   Authorized by:   Dossie Arbour MD   Signed by:   Benedict Needy, RN on 03/03/2010   Method used:   Electronically to        [facility]* (retail)       [address]       [place], [place]  [address]       Ph: [phone]       Fax: [phone]   RxID:   [id number] POTASSIUM CHLORIDE CRYS CR 20 MEQ CR-TABS (POTASSIUM CHLORIDE CRYS CR) Take one tablet by mouth daily  #30 x 4   Entered by:   Benedict Needy, RN   Authorized by:   Dossie Arbour MD   Signed by:   Benedict Needy, RN on 03/03/2010   Method used:   Electronically to        [facility]* (retail)       [address]       [place], [place]  [address]       Ph: [phone]       Fax: [phone]   RxID:   [id number] FUROSEMIDE 20 MG TABS (FUROSEMIDE) Take one tablet by mouth daily.  #30 x 4   Entered by:   Benedict Needy, RN   Authorized by:   Dossie Arbour MD   Signed by:   Benedict Needy, RN on 03/03/2010   Method used:   Electronically to        [facility]* (retail)       [address]       [place], [place]  [address]       Ph: [phone]       Fax:  [phone]   RxID:   [id number] CARVEDILOL 6.25 MG TABS (CARVEDILOL) Take 1 tablet by mouth two times a day  #60 x 4   Entered by:   Benedict Needy, RN   Authorized by:   Dossie Arbour MD   Signed by:   Benedict Needy, RN on 03/03/2010   Method used:   Electronically to        [facility]* (retail)       [address]       [place], [place]  [address]       Ph: [phone]       Fax:  [phone]   RxID:   [id number]

## 2010-08-12 NOTE — Progress Notes (Signed)
Summary: Medications  Phone Note Outgoing Call   Call placed by: Benedict Needy, RN,  March 09, 2010 2:59 PM Call placed to: Patient Summary of Call: Pt seen in office 8/24. Was found to be in a-fib started on coumadin and several other medications. Checking in with pt. LMOM TCB Initial call taken by: Benedict Needy, RN,  March 09, 2010 3:00 PM  Follow-up for Phone Call        [facility] TCB Benedict Needy, RN  March 10, 2010 9:50 AM   Pt called back does not have BP cuff but is not experiencing any dizziness will come into office today for coumadin check will take BP then Benedict Needy, RN  March 10, 2010 10:43 AM

## 2010-08-12 NOTE — Progress Notes (Signed)
Summary: Cardioversion  Phone Note Outgoing Call   Call placed by: Benedict Needy, RN,  April 27, 2010 9:06 AM Call placed to: Patient Summary of Call: Called to schedule cardioversion. LMOM TCB  Initial call taken by: Benedict Needy, RN,  April 27, 2010 9:07 AM  Follow-up for Phone Call        DCCV scheduled for 10/28 pt to arrive 6:30am. Labs and couamdin 10/26 as previosly scheduled. LMOM TCB Benedict Needy, RN  April 30, 2010 9:36 AM  Follow-up by: Bishop Dublin, CMA,  April 30, 2010 9:52 AM  Additional Follow-up for Phone Call Additional follow up Details #1::        Phone call completed:  Patient aware of appointments . Additional Follow-up by: Bishop Dublin, CMA,  April 30, 2010 9:53 AM

## 2010-08-12 NOTE — Assessment & Plan Note (Signed)
Summary: EKG/AMD  Nurse Visit   Vital Signs:  Patient profile:   71 year old male Height:      69 inches Weight:      233 pounds BMI:     34.53 Pulse rate:   74 / minute BP sitting:   150 / 90  (left arm) Cuff size:   large  Vitals Entered By: Bishop Dublin, CMA (May 12, 2010 8:35 AM)  Visit Type:  nurse visit  CC:  Still has a little shortness of breath..   Past History:  Past Medical History: Last updated: 03/03/2010 CAD diabetes Hyperlipidemia Hypertension GERD arthritis  Past Surgical History: Last updated: 03/03/2010 CAD; s/p CABG x 3 @ [facility] in 2009.  Family History: Last updated: 03/03/2010 Family History of Coronary Artery Disease: age 47 (Brother) living.  Social History: Last updated: 03/03/2010 Retired  Single  Tobacco Use - Former.  Alcohol Use - no  Risk Factors: Smoking Status: quit (03/03/2010)   Current Medications (verified): 1)  Metformin Hcl 500 Mg Tabs (Metformin Hcl) .... One Tablet Two Times A Day 2)  Aspir-Low 81 Mg Tbec (Aspirin) .... Take 2 Tablet By Mouth Once A Day 3)  Omeprazole 20 Mg Cpdr (Omeprazole) .... One Tablet Once Daily 4)  Coq10 100 Mg Caps (Coenzyme Q10) .... One Tablet Once Daily 5)  Furosemide 20 Mg Tabs (Furosemide) .... Take One Tablet By Mouth Daily. 6)  Potassium Chloride Crys Cr 20 Meq Cr-Tabs (Potassium Chloride Crys Cr) .... Take One Tablet By Mouth Daily 7)  Warfarin Sodium 5 Mg Tabs (Warfarin Sodium) .... Use As Directed By Anticoagulation Clinic 8)  Crestor 40 Mg Tabs (Rosuvastatin Calcium) .... Take1/2-1 Tablet By Mouth Daily. 9)  Digoxin 0.25 Mg Tabs (Digoxin) .... Take One Tablet By Mouth Daily in Pm 10)  Warfarin Sodium 2.5 Mg Tabs (Warfarin Sodium) .... Use As Directed By Anticoagualtion Clinic 11)  Sotalol Hcl 120 Mg Tabs (Sotalol Hcl) .... One Tablet Two Times A Day  Allergies (verified): No Known Drug Allergies

## 2010-08-12 NOTE — Medication Information (Signed)
Summary: rov/ewj  Anticoagulant Therapy  Managed by: Cloyde Reams, RN, BSN Referring MD: Dr Mariah Milling PCP: Dr. Tommie Sams MD: Shirlee Latch MD, Freida Busman Indication 1: atrial Fibrillation 427.31 Lab Used: [facility] [place] Site: [place] INR POC 1.3 INR RANGE 2.0-3.0  Dietary changes: yes       Details: Incr intake of Turnip greens.  Health status changes: no    Bleeding/hemorrhagic complications: no    Recent/future hospitalizations: no    Any changes in medication regimen? no    Recent/future dental: no  Any missed doses?: no       Is patient compliant with meds? yes       Allergies: No Known Drug Allergies  Anticoagulation Management History:      The patient is taking warfarin and comes in today for a routine follow up visit.  Positive risk factors for bleeding include an age of 69 years or older.  The bleeding index is 'intermediate risk'.  Positive CHADS2 values include History of CHF and History of HTN.  Negative CHADS2 values include Age > 74 years old.  His last INR was 2.46.  Anticoagulation responsible provider: Shirlee Latch MD, [place].  INR POC: 1.3.  Cuvette Lot#: [id number].  Exp: 08/2011.    Anticoagulation Management Assessment/Plan:      The patient's current anticoagulation dose is Warfarin sodium 5 mg tabs: Use as directed by Anticoagulation Clinic, Warfarin sodium 2.5 mg tabs: Use as directed by Anticoagualtion Clinic.  The target INR is 2.0-3.0.  The next INR is due 07/21/2010.  Anticoagulation instructions were given to patient.  Results were reviewed/authorized by Cloyde Reams, RN, BSN.  He was notified by Cloyde Reams RN.         Prior Anticoagulation Instructions: INR 1.7  Start taking 1 tablet daily except 1/2 tablet on Mondays.  Recheck in 2 weeks.   Current Anticoagulation Instructions: INR 1.3  Take 5mg  today, then start taking 2.5mg  daily.  Recheck in 1 week.

## 2010-08-12 NOTE — Medication Information (Signed)
Summary: rov/ewj  Anticoagulant Therapy  Managed by: Bethena Midget, RN, BSN Referring MD: Dr Mariah Milling PCP: Dr. Tommie Sams MD: Mariah Milling Indication 1: atrial Fibrillation 427.31 Lab Used: [facility] [place] Site: [place] INR POC 1.4 INR RANGE 2.0-3.0  Dietary changes: no    Health status changes: no    Bleeding/hemorrhagic complications: no    Recent/future hospitalizations: no    Any changes in medication regimen? no    Recent/future dental: no  Any missed doses?: no       Is patient compliant with meds? yes      Comments: Saw Dr Mariah Milling today  Allergies: No Known Drug Allergies  Anticoagulation Management History:      The patient is taking warfarin and comes in today for a routine follow up visit.  Positive risk factors for bleeding include an age of 69 years or older.  The bleeding index is 'intermediate risk'.  Positive CHADS2 values include History of CHF and History of HTN.  Negative CHADS2 values include Age > 46 years old.  His last INR was 2.46.  Anticoagulation responsible provider: [place].  INR POC: 1.4.  Cuvette Lot#: [id number].  Exp: 12/20212.    Anticoagulation Management Assessment/Plan:      The patient's current anticoagulation dose is Warfarin sodium 5 mg tabs: Use as directed by Anticoagulation Clinic, Warfarin sodium 2.5 mg tabs: Use as directed by Anticoagualtion Clinic.  The target INR is 2.0-3.0.  The next INR is due 06/16/2010.  Anticoagulation instructions were given to patient.  Results were reviewed/authorized by Bethena Midget, RN, BSN.  He was notified by Bethena Midget, RN, BSN.         Prior Anticoagulation Instructions: INR 2.3  Continue on same dosage 1 tablet daily except 1/2 tablet on Mondays, Wednesdays, and Saturdays.  Recheck in 3 weeks.    Current Anticoagulation Instructions: INR 1.4  Today take 1 1/2  pills then resume 1 pill everyday except 1/2 pill on Mondays, Wednesdays and Fridays. Recheck in 2 weeks.

## 2010-08-12 NOTE — Medication Information (Signed)
Summary: rov/ewj  Anticoagulant Therapy  Managed by: Cloyde Reams, RN, BSN Referring MD: Dr Mariah Milling PCP: Dr. Tommie Sams MD: Shirlee Latch MD, Freida Busman Indication 1: atrial Fibrillation 427.31 Lab Used: [facility] [place] Site:  INR POC 1.8 INR RANGE 2.0-3.0  Dietary changes: yes       Details: Quit eating greens.    Health status changes: no    Bleeding/hemorrhagic complications: no    Recent/future hospitalizations: no    Any changes in medication regimen? no    Recent/future dental: no  Any missed doses?: no       Is patient compliant with meds? yes       Allergies: No Known Drug Allergies  Anticoagulation Management History:      The patient is taking warfarin and comes in today for a routine follow up visit.  Positive risk factors for bleeding include an age of 69 years or older.  The bleeding index is 'intermediate risk'.  Positive CHADS2 values include History of CHF and History of HTN.  Negative CHADS2 values include Age > 55 years old.  His last INR was 2.46.  Anticoagulation responsible provider: Shirlee Latch MD, [place].  INR POC: 1.8.  Cuvette Lot#: [id number].  Exp: 08/2011.    Anticoagulation Management Assessment/Plan:      The patient's current anticoagulation dose is Warfarin sodium 5 mg tabs: Use as directed by Anticoagulation Clinic, Warfarin sodium 2.5 mg tabs: Use as directed by Anticoagualtion Clinic.  The target INR is 2.0-3.0.  The next INR is due 08/04/2010.  Anticoagulation instructions were given to patient.  Results were reviewed/authorized by Cloyde Reams, RN, BSN.  He was notified by Cloyde Reams RN.         Prior Anticoagulation Instructions: INR 1.3  Take 5mg  today, then start taking 2.5mg  daily.  Recheck in 1 week.   Current Anticoagulation Instructions: INR 1.8  Take 5mg  today, then start taking 2.5mg  daily except 5mg  on Wednesdays.  Resume eating your green leafy vegetables. Recheck in 2 weeks.

## 2010-08-12 NOTE — Medication Information (Signed)
Summary: rov/ewj  Anticoagulant Therapy  Managed by: Cloyde Reams, RN, BSN Referring MD: Dr Mariah Milling PCP: Dr. Tommie Sams MD: Mariah Milling Indication 1: atrial Fibrillation 427.31 Lab Used: [facility] [place] Site: [place] INR POC 2.3 INR RANGE 2.0-3.0  Dietary changes: no    Health status changes: no    Bleeding/hemorrhagic complications: no    Recent/future hospitalizations: no    Any changes in medication regimen? yes       Details: hold ditiazem and Coreg, started on Sotolol 120mg  bid  Recent/future dental: no  Any missed doses?: no       Is patient compliant with meds? yes      Comments: s/p DCCV last week.   Allergies: No Known Drug Allergies  Anticoagulation Management History:      The patient is taking warfarin and comes in today for a routine follow up visit.  Positive risk factors for bleeding include an age of 69 years or older.  The bleeding index is 'intermediate risk'.  Positive CHADS2 values include History of HTN.  Negative CHADS2 values include Age > 71 years old.  His last INR was 2.46.  Anticoagulation responsible Ariana Cavenaugh: [facility].  INR POC: 2.3.  Cuvette Lot#: [id number].  Exp: 05/2011.    Anticoagulation Management Assessment/Plan:      The patient's current anticoagulation dose is Warfarin sodium 5 mg tabs: Use as directed by Anticoagulation Clinic, Warfarin sodium 2.5 mg tabs: Use as directed by Anticoagualtion Clinic.  The target INR is 2.0-3.0.  The next INR is due 06/02/2010.  Results were reviewed/authorized by Cloyde Reams, RN, BSN.  He was notified by Cloyde Reams RN.         Prior Anticoagulation Instructions: INR 2.8  Continue on same dosage 1 tablet daily except 1/2 tablet on Mondays, Wednesdays, and Saturdays.  Recheck in 2 weeks.    Current Anticoagulation Instructions: INR 2.3  Continue on same dosage 1 tablet daily except 1/2 tablet on Mondays, Wednesdays, and Saturdays.  Recheck in 3 weeks.

## 2010-08-12 NOTE — Medication Information (Signed)
Summary: rov/ewj  Anticoagulant Therapy  Managed by: Cloyde Reams, RN, BSN Referring MD: Dr Mariah Milling PCP: Dr. Tommie Sams MD: Shirlee Latch MD, Freida Busman Indication 1: atrial Fibrillation 427.31 Lab Used: [facility] [place] Site: [place] INR POC 3.8 INR RANGE 2.0-3.0           Allergies: No Known Drug Allergies   Anticoagulation Management History:      The patient is taking warfarin and comes in today for a routine follow up visit.  Positive risk factors for bleeding include an age of 66 years or older.  The bleeding index is 'intermediate risk'.  Positive CHADS2 values include History of HTN.  Negative CHADS2 values include Age > 1 years old.  Anticoagulation responsible provider: Shirlee Latch MD, [place].  INR POC: 3.8.  Exp: 05/2011.    Anticoagulation Management Assessment/Plan:      The patient's current anticoagulation dose is Warfarin sodium 5 mg tabs: Use as directed by Anticoagulation Clinic, Warfarin sodium 2.5 mg tabs: Use as directed by Anticoagualtion Clinic.  The target INR is 2.0-3.0.  The next INR is due 04/21/2010.  Results were reviewed/authorized by Cloyde Reams, RN, BSN.  He was notified by Benedict Needy, RN.         Prior Anticoagulation Instructions: INR 3.2  Skip today's dosage of Coumadin, then resume 1/2 tablet daily. Recheck in 1 week.    Current Anticoagulation Instructions: INR 3.8  Skip todays dose of coumadin, then Start taking 2.5mg  1 tabs daily except for 1/2 tab on Wedensday. Recheck in 1 week.  Prescriptions: WARFARIN SODIUM 2.5 MG TABS (WARFARIN SODIUM) Use as directed by Anticoagualtion Clinic  #10 x 0   Entered by:   Benedict Needy, RN   Authorized by:   Dossie Arbour MD   Signed by:   Benedict Needy, RN on 04/12/2010   Method used:   Samples Given   RxID:   [id number]

## 2010-08-12 NOTE — Assessment & Plan Note (Signed)
Summary: F2W/AMD   Visit Type:  Follow-up Primary Provider:  Dr. Glenis Smoker  CC:  "Doing well". He has some sinus drainage.Marland Kitchen  History of Present Illness: [name] is a pleasant 70 year old gentleman with a history of coronary artery disease, CABG x3 at [facility] in 2009, diabetes, catheterization in March 2009 showing patent grafts with elevated pulmonary pressures who developed atrial fibrillation at the beginning of the summer, cardioversion 04/20/2010 who has maintained NSR, seen 2 weeks ago for worsening edema, presents for routine follow up. H/o medication noncompliance in the past.  He was not taking his lasix on a regular basis and has been taking lasix 20 mg daily x 2 weeks. He has had a 10 lb weight loss.  BNP and BMP yesterday shows normal renal function and normal BNP. He feels well.  Stress test in February 2009 showed ischemia in the apical and inferolateral walls with ejection fraction 66%.  Echocardiogram March 2009 showed ejection fraction 50-55%, mild aortic stenosis, mild pulmonary hypertension with right ventricular systolic pressure estimated at 32 mm of mercury, aortic valve area of 1.7 cm, mildly dilated left atrium  Cardiac catheterization March 2009 showed 60% left main disease, 25% LAD disease, 50% circumflex disease, 40% OM1 disease, 30% OM 2 disease, 100% RCA disease, patent bypass grafts with a saphenous vein graft to the OM 2, LIMA to the LAD, graft to the PDA  EKG : normal sinus rhythm with rate of 80 beats per minute, no significant ST or T wave changes  Current Medications (verified): 1)  Metformin Hcl 500 Mg Tabs (Metformin Hcl) .... One Tablet Two Times A Day 2)  Aspir-Low 81 Mg Tbec (Aspirin) .... Take 2 Tablet By Mouth Once A Day 3)  Omeprazole 20 Mg Cpdr (Omeprazole) .... One Tablet Once Daily 4)  Furosemide 40 Mg Tabs (Furosemide) .... One Tablet Once Daily 5)  Klor-Con 20 Meq Pack (Potassium Chloride) .... Mix With Water 6)  Warfarin Sodium 5 Mg Tabs  (Warfarin Sodium) .... Use As Directed By Anticoagulation Clinic 7)  Crestor 10 Mg Tabs (Rosuvastatin Calcium) .... 1/2 Tablet At Bedtime 8)  Digoxin 0.125 Mg Tabs (Digoxin) .... Take One Tablet Once Daily 9)  Warfarin Sodium 2.5 Mg Tabs (Warfarin Sodium) .... Use As Directed By Anticoagualtion Clinic 10)  Sotalol Hcl 120 Mg Tabs (Sotalol Hcl) .... One Tablet Two Times A Day 11)  Lisinopril 10 Mg Tabs (Lisinopril) .... Take One Tablet Once Daily  Allergies (verified): No Known Drug Allergies  Past History:  Past Medical History: Last updated: 05/19/2010 1. CAD: CABG x3 at [facility] in 2009. Repeat catheterization in March 2009 showing patent grafts with elevated pulmonary pressures  2. diabetes 3. Hyperlipidemia 4. Hypertension 5. GERD 6. arthritis 7. Atrial fibrillation: s/p DCCV 10/11, sotalol begun.  Patient is on warfarin.  8. Diastolic CHF  Past Surgical History: Last updated: 03/03/2010 CAD; s/p CABG x 3 @ [facility] in 2009.  Family History: Last updated: 03/03/2010 Family History of Coronary Artery Disease: age 1 (Brother) living.  Social History: Last updated: 03/03/2010 Retired  Single  Tobacco Use - Former.  Alcohol Use - no  Risk Factors: Smoking Status: quit (03/03/2010)  Review of Systems       The patient complains of peripheral edema.  The patient denies fever, weight loss, weight gain, vision loss, decreased hearing, hoarseness, chest pain, syncope, dyspnea on exertion, prolonged cough, abdominal pain, incontinence, muscle weakness, depression, and enlarged lymph nodes.    Vital Signs:  Patient profile:   69  year old male Height:      69 inches Weight:      225 pounds BMI:     33.35 Pulse rate:   80 / minute BP sitting:   124 / 68  (left arm) Cuff size:   regular  Vitals Entered By: Bishop Dublin, CMA (June 02, 2010 3:15 PM)  Physical Exam  General:  Well developed, well nourished, in no acute distress. Head:  normocephalic and atraumatic Neck:   Neck supple, JVP 8-9 cm. No masses, thyromegaly or abnormal cervical nodes. Lungs:  Clear bilaterally to auscultation and percussion. Heart:  Non-displaced PMI, chest non-tender; regular rate and rhythm, S1, S2 with II/VI SEM RSB, no rubs or gallops. Carotid upstroke normal, no bruit.  Pedals normal pulses. 1+ pitting  edema, no varicosities. Abdomen:  Bowel sounds positive; abdomen soft and non-tender without masses Msk:  Back normal, normal gait. Muscle strength and tone normal. Pulses:  pulses normal in all 4 extremities Extremities:  No clubbing or cyanosis. Neurologic:  Alert and oriented x 3.   Impression & Recommendations:  Problem # 1:  ATRIAL FIBRILLATION (ICD-427.31) he is holding sinus rhythm on his current medication regimen. Continue warfarin.  His updated medication list for this problem includes:    Aspir-low 81 Mg Tbec (Aspirin) .Marland Kitchen... Take 2 tablet by mouth once a day    Warfarin Sodium 5 Mg Tabs (Warfarin sodium) ..... Use as directed by anticoagulation clinic    Digoxin 0.125 Mg Tabs (Digoxin) .Marland Kitchen... Take one tablet once daily    Warfarin Sodium 2.5 Mg Tabs (Warfarin sodium) ..... Use as directed by anticoagualtion clinic    Sotalol Hcl 120 Mg Tabs (Sotalol hcl) ..... One tablet two times a day  Problem # 2:  EDEMA (ICD-782.3) Edema are improved though still pitting. Given his normal BNP, and concern his edema could be secondary to venous insufficiency. We will continue him on Lasix 20 mg daily. Have asked him to contact me if he drops anymore significant weight as he has dropped 10 pounds in the past 2 weeks. He feels well currently and does not feel dehydrated.  Problem # 3:  HYPERTENSION, BENIGN (ICD-401.1) blood pressure is well controlled on his current medication regimen. He has responded well to lisinopril.  His updated medication list for this problem includes:    Aspir-low 81 Mg Tbec (Aspirin) .Marland Kitchen... Take 2 tablet by mouth once a day    Furosemide 40 Mg Tabs  (Furosemide) ..... One tablet once daily    Sotalol Hcl 120 Mg Tabs (Sotalol hcl) ..... One tablet two times a day    Lisinopril 10 Mg Tabs (Lisinopril) .Marland Kitchen... Take one tablet once daily  Problem # 4:  CAD (ICD-414.00) Continue aggressive lipid management. No symptoms of angina at this time.  His updated medication list for this problem includes:    Aspir-low 81 Mg Tbec (Aspirin) .Marland Kitchen... Take 2 tablet by mouth once a day    Warfarin Sodium 5 Mg Tabs (Warfarin sodium) ..... Use as directed by anticoagulation clinic    Warfarin Sodium 2.5 Mg Tabs (Warfarin sodium) ..... Use as directed by anticoagualtion clinic    Sotalol Hcl 120 Mg Tabs (Sotalol hcl) ..... One tablet two times a day    Lisinopril 10 Mg Tabs (Lisinopril) .Marland Kitchen... Take one tablet once daily

## 2010-08-12 NOTE — Medication Information (Signed)
Summary: CCR/AMD  Anticoagulant Therapy  Managed by: Cloyde Reams, RN, BSN Referring MD: Dr Mariah Milling PCP: Dr. Tommie Sams MD: Shirlee Latch MD, Freida Busman Indication 1: atrial Fibrillation 427.31 Lab Used: [facility] [place] Site: [place] INR POC 3.2 INR RANGE 2.0-3.0  Dietary changes: no    Health status changes: no    Bleeding/hemorrhagic complications: no    Recent/future hospitalizations: no    Any changes in medication regimen? no    Recent/future dental: no  Any missed doses?: no       Is patient compliant with meds? yes       Allergies: No Known Drug Allergies  Anticoagulation Management History:      The patient is taking warfarin and comes in today for a routine follow up visit.  Positive risk factors for bleeding include an age of 69 years or older.  The bleeding index is 'intermediate risk'.  Positive CHADS2 values include History of HTN.  Negative CHADS2 values include Age > 69 years old.  Anticoagulation responsible provider: Shirlee Latch MD, [place].  INR POC: 3.2.  Cuvette Lot#: [id number].  Exp: 05/2011.    Anticoagulation Management Assessment/Plan:      The patient's current anticoagulation dose is Warfarin sodium 5 mg tabs: Use as directed by Anticoagulation Clinic.  The target INR is 2.0-3.0.  The next INR is due 04/12/2010.  Results were reviewed/authorized by Cloyde Reams, RN, BSN.  He was notified by Cloyde Reams RN.         Prior Anticoagulation Instructions: INR 4.6   DO NOT TAKE COUMADIN MONDAY OR TUESDAY.  Start taking 1/2 tab daily. Recheck in 10 days.  Current Anticoagulation Instructions: INR 3.2  Skip today's dosage of Coumadin, then resume 1/2 tablet daily. Recheck in 1 week.

## 2010-08-12 NOTE — Assessment & Plan Note (Signed)
Summary: F3W/AMD   Visit Type:  Follow-up Primary Provider:  Dr. Glenis Smoker  CC:  c/o shortness of breath with little chest pain. Does have some swelling in legs and feet and ankles with left leg pain.Marland Kitchen  History of Present Illness: Christian [name] is a pleasant 70 year old gentleman with a history of coronary artery disease, CABG x3 at [facility] in 2009, diabetes, catheterization in March 2009 showing patent grafts with elevated pulmonary pressures who developed atrial fibrillation at the beginning of the summer who presents for routine followup. he has been noncompliant with some of his medications including his Plavix, Lasix, Crestor.  we started him on digoxin on his last visit as his heart rate was elevated. He reports that he feels unbalanced and has malaise after he takes his morning medications. He does not like to take Lasix as he believes he does not drink enough as it is. previous EKG showed ventricular rate of 112 beats per minute.  Stress test in February 2009 showed ischemia in the apical and inferolateral walls with ejection fraction 66%.  Echocardiogram March 2009 showed ejection fraction 50-55%, mild aortic stenosis, mild pulmonary hypertension with right ventricular systolic pressure estimated at 32 mm of mercury, aortic valve area of 1.7 cm, mildly dilated left atrium  Cardiac catheterization March 2009 showed 60% left main disease, 25% LAD disease, 50% circumflex disease, 40% OM1 disease, 30% OM 2 disease, 100% RCA disease, patent bypass grafts with a saphenous vein graft to the OM 2, LIMA to the LAD, graft to the PDA  EKG today shows atrial fibrillation with ventricular rate of 76 beats per minute.  Current Medications (verified): 1)  Metformin Hcl 500 Mg Tabs (Metformin Hcl) .... One Tablet Two Times A Day 2)  Carvedilol 6.25 Mg Tabs (Carvedilol) .... Take 1 Tablet By Mouth Two Times A Day 3)  Aspir-Low 81 Mg Tbec (Aspirin) .... Take 2 Tablet By Mouth Once A Day 4)  Omeprazole 20  Mg Cpdr (Omeprazole) .... One Tablet Once Daily 5)  Coq10 100 Mg Caps (Coenzyme Q10) .... One Tablet Once Daily 6)  Furosemide 20 Mg Tabs (Furosemide) .... Take One Tablet By Mouth Daily. 7)  Potassium Chloride Crys Cr 20 Meq Cr-Tabs (Potassium Chloride Crys Cr) .... Take One Tablet By Mouth Daily 8)  Warfarin Sodium 5 Mg Tabs (Warfarin Sodium) .... Use As Directed By Anticoagulation Clinic 9)  Diltiazem Hcl Er Beads 120 Mg Xr24h-Cap (Diltiazem Hcl Er Beads) .... Take One Capsule By Mouth Daily 10)  Crestor 40 Mg Tabs (Rosuvastatin Calcium) .... Take1/2-1 Tablet By Mouth Daily. 11)  Digoxin 0.25 Mg Tabs (Digoxin) .... Take One Tablet By Mouth Daily  Allergies (verified): No Known Drug Allergies  Past History:  Past Medical History: Last updated: 03/03/2010 CAD diabetes Hyperlipidemia Hypertension GERD arthritis  Past Surgical History: Last updated: 03/03/2010 CAD; s/p CABG x 3 @ [facility] in 2009.  Family History: Last updated: 03/03/2010 Family History of Coronary Artery Disease: age 44 (Brother) living.  Social History: Last updated: 03/03/2010 Retired  Single  Tobacco Use - Former.  Alcohol Use - no  Risk Factors: Smoking Status: quit (03/03/2010)  Review of Systems       The patient complains of peripheral edema.  The patient denies fever, weight loss, weight gain, vision loss, decreased hearing, hoarseness, chest pain, syncope, dyspnea on exertion, prolonged cough, abdominal pain, incontinence, muscle weakness, depression, and enlarged lymph nodes.         malise and instability after AM meds  Vital Signs:  Patient profile:   70 year old male Height:      69 inches Weight:      232.50 pounds BMI:     34.46 Pulse rate:   76 / minute BP sitting:   140 / 78  (left arm) Cuff size:   large  Vitals Entered By: Christian [name], CMA (April 12, 2010 11:10 AM)  Physical Exam  General:  Well developed, well nourished, in no acute distress. Head:  normocephalic and  atraumatic Neck:  Neck supple, no JVD. No masses, thyromegaly or abnormal cervical nodes. Lungs:  Clear bilaterally to auscultation and percussion. Heart:  Non-displaced PMI, chest non-tender; regular rate and rhythm, S1, S2 without murmurs, rubs or gallops. Carotid upstroke normal, no bruit.  Pedals normal pulses. 1+ edema of b/l LE, no varicosities. Abdomen:  Bowel sounds positive; abdomen soft and non-tender without masses Msk:  Back normal, normal gait. Muscle strength and tone normal. Pulses:  pulses normal in all 4 extremities Extremities:  No clubbing or cyanosis. Neurologic:  Alert and oriented x 3. Skin:  Intact without lesions or rashes. Psych:  Normal affect.   Impression & Recommendations:  Problem # 1:  ATRIAL FIBRILLATION (ICD-427.31) rate is improved on his current medication regimen. We will move the digoxin and diltiazem to the p.m. to see if this helps his morning malaise and gait instability. We did talk to him about cardioversion and he would like to think about this  His updated medication list for this problem includes:    Carvedilol 6.25 Mg Tabs (Carvedilol) .Marland Kitchen... Take 1 tablet by mouth two times a day    Aspir-low 81 Mg Tbec (Aspirin) .Marland Kitchen... Take 2 tablet by mouth once a day    Warfarin Sodium 5 Mg Tabs (Warfarin sodium) ..... Use as directed by anticoagulation clinic    Digoxin 0.25 Mg Tabs (Digoxin) .Marland Kitchen... Take one tablet by mouth daily in pm    Warfarin Sodium 2.5 Mg Tabs (Warfarin sodium) ..... Use as directed by anticoagualtion clinic  Problem # 2:  HYPERTENSION, BENIGN (ICD-401.1) Blood pressure is well controlled on his current medication regimen.  His updated medication list for this problem includes:    Carvedilol 6.25 Mg Tabs (Carvedilol) .Marland Kitchen... Take 1 tablet by mouth two times a day    Aspir-low 81 Mg Tbec (Aspirin) .Marland Kitchen... Take 2 tablet by mouth once a day    Furosemide 20 Mg Tabs (Furosemide) .Marland Kitchen... Take one tablet by mouth daily.    Diltiazem Hcl Er  Beads 120 Mg Xr24h-cap (Diltiazem hcl er beads) .Marland Kitchen... Take one capsule by mouth daily in pm  Problem # 3:  CAD (ICD-414.00) Stable CAD, history of CABG. No symptoms of ischemia at this time No testing at this time  His updated medication list for this problem includes:    Carvedilol 6.25 Mg Tabs (Carvedilol) .Marland Kitchen... Take 1 tablet by mouth two times a day    Aspir-low 81 Mg Tbec (Aspirin) .Marland Kitchen... Take 2 tablet by mouth once a day    Warfarin Sodium 5 Mg Tabs (Warfarin sodium) ..... Use as directed by anticoagulation clinic    Diltiazem Hcl Er Beads 120 Mg Xr24h-cap (Diltiazem hcl er beads) .Marland Kitchen... Take one capsule by mouth daily in pm    Warfarin Sodium 2.5 Mg Tabs (Warfarin sodium) ..... Use as directed by anticoagualtion clinic  Problem # 4:  EDEMA (ICD-782.3) Bilateral edema. He is taking his Lasix on a p.r.n. basis only. I've encouraged him to take his Lasix more frequently though  he does not want to do this as he feels he does not drink enough.  Patient Instructions: 1)  Your physician recommends that you return for lab work in: BNP/BMP/DIG same day as coumadin appointment  2)  Your physician wants you to follow-up in:   3 months You will receive a reminder letter in the mail two months in advance. If you don't receive a letter, please call our office to schedule the follow-up appointment.

## 2010-08-12 NOTE — Assessment & Plan Note (Signed)
Summary: ROV/AMD   Visit Type:  Follow-up Primary Christian [name]:  Dr. Glenis Smoker  CC:  c/o shortness of breath and can't sleep at night and just getting worse.Christian [name]  History of Present Illness: 70 yo with history of atrial fibrillation s/p recent cardioversion, diastolic CHF, and CAD s/p CABG presents for evaluation of increasing dyspnea.  Patient was successfully cardioverted to NSR and started on sotalol in 10/11.  He remains in NSR today.  He feels like he is retaining fluid.  Weight is up 3 lbs since last appointment.  He is short of breath after walking about 200 yards or going up a flight of steps.  He is orthopneic.  No chest pain.  Lower extremity edema is worsening.  He is only taking lasix every 2-3 days because it gets his mouth dry and he is afraid that he is getting dehydrated.    ECG: NSR, diffuse nonspecific T wave inversions, QTc 451 msec  Labs (10/11): K 4.1, creatinine 1.2, digoxin 0.8, BNP 169  Current Medications (verified): 1)  Metformin Hcl 500 Mg Tabs (Metformin Hcl) .... One Tablet Two Times A Day 2)  Aspir-Low 81 Mg Tbec (Aspirin) .... Take 2 Tablet By Mouth Once A Day 3)  Omeprazole 20 Mg Cpdr (Omeprazole) .... One Tablet Once Daily 4)  Furosemide 20 Mg Tabs (Furosemide) .... Take One Tablet By Mouth Daily. 5)  Potassium Chloride Crys Cr 20 Meq Cr-Tabs (Potassium Chloride Crys Cr) .... Take One Tablet By Mouth Daily 6)  Warfarin Sodium 5 Mg Tabs (Warfarin Sodium) .... Use As Directed By Anticoagulation Clinic 7)  Crestor 40 Mg Tabs (Rosuvastatin Calcium) .... Take1/2-1 Tablet By Mouth Daily. 8)  Digoxin 0.25 Mg Tabs (Digoxin) .... Take One Tablet By Mouth Daily in Pm 9)  Warfarin Sodium 2.5 Mg Tabs (Warfarin Sodium) .... Use As Directed By Anticoagualtion Clinic 10)  Sotalol Hcl 120 Mg Tabs (Sotalol Hcl) .... One Tablet Two Times A Day  Allergies (verified): No Known Drug Allergies  Past History:  Past Surgical History: Last updated: 03/03/2010 CAD; s/p CABG x 3 @  [facility] in 2009.  Family History: Last updated: 03/03/2010 Family History of Coronary Artery Disease: age 57 (Brother) living.  Social History: Last updated: 03/03/2010 Retired  Single  Tobacco Use - Former.  Alcohol Use - no  Risk Factors: Smoking Status: quit (03/03/2010)  Past Medical History: 1. CAD: CABG x3 at [facility] in 2009. Repeat catheterization in March 2009 showing patent grafts with elevated pulmonary pressures  2. diabetes 3. Hyperlipidemia 4. Hypertension 5. GERD 6. arthritis 7. Atrial fibrillation: s/p DCCV 10/11, sotalol begun.  Patient is on warfarin.  8. Diastolic CHF  Family History: Reviewed history from 03/03/2010 and no changes required. Family History of Coronary Artery Disease: age 26 (Brother) living.  Social History: Reviewed history from 03/03/2010 and no changes required. Retired  Single  Tobacco Use - Former.  Alcohol Use - no  Review of Systems       All systems reviewed and negative except as per HPI.   Vital Signs:  Patient profile:   70 year old male Height:      69 inches Weight:      235 pounds Pulse rate:   53 / minute BP sitting:   189 / 105  (left arm) Cuff size:   large  Vitals Entered By: Bishop Dublin, CMA (May 19, 2010 1:58 PM)  Physical Exam  General:  Well developed, well nourished, in no acute distress. Neck:  Neck supple, JVP  8-9 cm. No masses, thyromegaly or abnormal cervical nodes. Lungs:  Crackles at bases bilaterally.  Heart:  Non-displaced PMI, chest non-tender; regular rate and rhythm, S1, S2 without murmurs, rubs or gallops. Carotid upstroke normal, no bruit.  Pedals normal pulses. 2+ edema to knees bilaterally. Abdomen:  Bowel sounds positive; abdomen soft and non-tender without masses, organomegaly, or hernias noted. No hepatosplenomegaly. Extremities:  No clubbing or cyanosis. Neurologic:  Alert and oriented x 3. Psych:  depressed affect.     Impression & Recommendations:  Problem # 1:  ATRIAL  FIBRILLATION (ICD-427.31) Patient is maintaining NSR post-DCCV.  He is on coumadin.  He is on sotalol and QTc is not significantly prolonged on ECG.  Can decrease digoxin to 0.125 mg daily as he is staying in NSR.   Problem # 2:  SHORTNESS OF BREATH (ICD-786.05) Suspect diastolic CHF worsened by poor BP control.  He is not taking Lasix as prescribed (only every 2-3 days).  NYHA class III symptoms.  He appears volume overloaded on exam - Increase Lasix to 40 mg daily and take every day.  Continue KCl 20 mEq can use.   - Echocardiogram to assess LV systolic and diastolic fxn and valves.  - BMET/BNP and followup in 2 wks.   Problem # 3:  HYPERTENSION, BENIGN (ICD-401.1) BP very high.  Patient is not on CCBs due to concern for fluid retention.  Start lisinopril 10 mg daily.  BMET in 2 wks, BP check at 2 week office visit.   Other Orders: Echocardiogram (Echo)  Patient Instructions: 1)  Your physician recommends that you schedule a follow-up appointment in: 2 weeks with Dr. Shirlee Latch or Dr. Mariah Milling 2)  Your physician recommends that you return for lab work in: BNP and BMET day before appointment with MD. 3)  Your physician has requested that you have an echocardiogram.  Echocardiography is a painless test that uses sound waves to create images of your heart. It provides your doctor with information about the size and shape of your heart and how well your heart's chambers and valves are working.  This procedure takes approximately one hour. There are no restrictions for this procedure. 4)  Your physician has recommended you make the following change in your medication: Start lisinopril 10mg  one tablet a day, Start lasix 40mg  one tablet a day, take digoxin .125mg  one tablet a day, and Potassium Powder 20 meq. 5)  Please wear compression stockings a directed by MD. Prescriptions: KLOR-CON 20 MEQ PACK (POTASSIUM CHLORIDE) Mix with water  #25 x 3   Entered by:   Bishop Dublin, CMA   Authorized by:   Marca Ancona, MD   Signed by:   Marca Ancona, MD on 05/19/2010   Method used:   Historical   RxID:   [id number] DIGOXIN 0.125 MG TABS (DIGOXIN) Take one tablet once daily  #30 x 3   Entered by:   West Carbo   Authorized by:   Marca Ancona, MD   Signed by:   West Carbo on 05/19/2010   Method used:   Electronically to        [facility]* (retail)       [address]       [place], [place]  [address]       Ph: [phone]       Fax: [phone]   RxID:   [id number] FUROSEMIDE 40 MG TABS (FUROSEMIDE) One Tablet once daily  #30 x 3   Entered by:   West Carbo  Authorized by:   Marca Ancona, MD   Signed by:   West Carbo on 05/19/2010   Method used:   Electronically to        [facility]* (retail)       [address]       [place], [place]  [address]       Ph: [phone]       Fax: [phone]   RxID:   [id number] LISINOPRIL 10 MG TABS (LISINOPRIL) take one tablet once daily  #30 x 3   Entered by:   West Carbo   Authorized by:   Marca Ancona, MD   Signed by:   West Carbo on 05/19/2010   Method used:   Electronically to        [facility]* (retail)       [address]       [place], [place]  [address]       Ph: [phone]       Fax: [phone]   RxID:   [id number]

## 2010-08-12 NOTE — Letter (Signed)
Summary: Medical Record Release  Medical Record Release   Imported By: Harlon Flor 03/03/2010 16:40:38  _____________________________________________________________________  External Attachment:    Type:   Image     Comment:   External Document

## 2010-09-13 ENCOUNTER — Other Ambulatory Visit (INDEPENDENT_AMBULATORY_CARE_PROVIDER_SITE_OTHER): Payer: PRIVATE HEALTH INSURANCE

## 2010-09-13 ENCOUNTER — Encounter: Payer: Self-pay | Admitting: Cardiovascular Disease

## 2010-09-13 DIAGNOSIS — E785 Hyperlipidemia, unspecified: Secondary | ICD-10-CM

## 2010-09-18 ENCOUNTER — Encounter: Payer: Self-pay | Admitting: Cardiovascular Disease

## 2010-09-23 LAB — CONVERTED CEMR LAB
AST: 14 units/L (ref 0–37)
Albumin: 4 g/dL (ref 3.5–5.2)
HDL: 34 mg/dL — ABNORMAL LOW (ref >39)
LDL Cholesterol: 97 mg/dL (ref 0–99)
Total Bilirubin: 0.5 mg/dL (ref 0.3–1.2)
Total CHOL/HDL Ratio: 4.6
VLDL: 25 mg/dL (ref 0–40)

## 2010-10-06 ENCOUNTER — Encounter: Payer: Self-pay | Admitting: Cardiovascular Disease

## 2010-10-06 DIAGNOSIS — I4891 Unspecified atrial fibrillation: Secondary | ICD-10-CM

## 2010-11-22 ENCOUNTER — Other Ambulatory Visit: Payer: Self-pay | Admitting: Emergency Medicine

## 2010-11-22 MED ORDER — SOTALOL HCL 120 MG PO TABS: 120.0000 mg | ORAL_TABLET | Freq: Two times a day (BID) | ORAL | Status: DC

## 2010-12-01 ENCOUNTER — Ambulatory Visit: Payer: Self-pay | Admitting: Cardiovascular Disease

## 2010-12-07 ENCOUNTER — Encounter: Payer: Self-pay | Admitting: Family Medicine

## 2010-12-07 ENCOUNTER — Ambulatory Visit (INDEPENDENT_AMBULATORY_CARE_PROVIDER_SITE_OTHER): Payer: PRIVATE HEALTH INSURANCE | Admitting: Family Medicine

## 2010-12-07 DIAGNOSIS — I4891 Unspecified atrial fibrillation: Secondary | ICD-10-CM

## 2010-12-07 DIAGNOSIS — E119 Type 2 diabetes mellitus without complications: Secondary | ICD-10-CM

## 2010-12-07 DIAGNOSIS — E785 Hyperlipidemia, unspecified: Secondary | ICD-10-CM

## 2010-12-07 DIAGNOSIS — I10 Essential (primary) hypertension: Secondary | ICD-10-CM

## 2010-12-07 DIAGNOSIS — M549 Dorsalgia, unspecified: Secondary | ICD-10-CM

## 2010-12-07 DIAGNOSIS — R739 Hyperglycemia, unspecified: Secondary | ICD-10-CM | POA: Insufficient documentation

## 2010-12-07 MED ORDER — GLUCOSE BLOOD VI STRP: ORAL_STRIP | Status: DC

## 2010-12-07 MED ORDER — CELECOXIB 100 MG PO CAPS: 100.0000 mg | ORAL_CAPSULE | Freq: Every day | ORAL | Status: DC
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
## 2010-12-07 NOTE — Patient Instructions (Signed)
Check your sugar in the morning 1-2 times a week.  Let me know if the numbers go progressively higher. Plan on coming back in 6 months with labs ahead of time.  OV. Let me know if you need help getting the celebrex or if it doesn't help with the pain.  Stay away from sweets.   You can get your results through our phone system.  Follow the instructions on the blue card.

## 2010-12-08 ENCOUNTER — Telehealth: Payer: Self-pay | Admitting: *Deleted

## 2010-12-08 NOTE — Telephone Encounter (Signed)
Prior auth is needed for celebrex, form is on your desk. 

## 2010-12-08 NOTE — Telephone Encounter (Signed)
Will address the hard copy.  

## 2010-12-09 ENCOUNTER — Encounter: Payer: Self-pay | Admitting: Family Medicine

## 2010-12-09 NOTE — Assessment & Plan Note (Signed)
Regular today on exam, no change in meds.

## 2010-12-09 NOTE — Assessment & Plan Note (Signed)
Controlled , no change in meds 

## 2010-12-09 NOTE — Assessment & Plan Note (Signed)
Per cards  

## 2010-12-09 NOTE — Assessment & Plan Note (Signed)
D/w pt about this.  A trial of low dose celebrex is reasonable given the pain he currently has, the prev relief with celebrex, and the lack of effect with other meds. He is aware of potential interaction with other meds and GI/bleed risk.

## 2010-12-09 NOTE — Assessment & Plan Note (Signed)
Check A1c today, d/w pt about diet and sugar.  See notes on labs.  Requesting records.

## 2010-12-15 ENCOUNTER — Telehealth: Payer: Self-pay | Admitting: *Deleted

## 2010-12-15 NOTE — Telephone Encounter (Signed)
Prior auth given for celebrex, advised pharmacy.  Approval letter placed on doctor's desk for signature and scanning.

## 2010-12-21 ENCOUNTER — Encounter: Payer: Self-pay | Admitting: Family Medicine

## 2010-12-24 ENCOUNTER — Other Ambulatory Visit (INDEPENDENT_AMBULATORY_CARE_PROVIDER_SITE_OTHER): Payer: PRIVATE HEALTH INSURANCE | Admitting: *Deleted

## 2010-12-24 ENCOUNTER — Other Ambulatory Visit: Payer: Self-pay | Admitting: Family Medicine

## 2010-12-24 DIAGNOSIS — E119 Type 2 diabetes mellitus without complications: Secondary | ICD-10-CM

## 2010-12-24 DIAGNOSIS — E785 Hyperlipidemia, unspecified: Secondary | ICD-10-CM

## 2010-12-24 LAB — LIPID PANEL
Cholesterol: 251 mg/dL — ABNORMAL HIGH (ref 0–200)
Total CHOL/HDL Ratio: 7.2 Ratio
VLDL: 30 mg/dL (ref 0–40)

## 2010-12-24 LAB — HEPATIC FUNCTION PANEL
ALT: 10 U/L (ref 0–53)
AST: 17 U/L (ref 0–37)
Alkaline Phosphatase: 98 U/L (ref 39–117)
Bilirubin, Direct: 0.1 mg/dL (ref 0.0–0.3)
Total Bilirubin: 0.4 mg/dL (ref 0.3–1.2)

## 2010-12-25 LAB — HEMOGLOBIN A1C
Hgb A1c MFr Bld: 6.1 % — ABNORMAL HIGH (ref <5.7)
Mean Plasma Glucose: 128 mg/dL — ABNORMAL HIGH (ref <117)

## 2010-12-27 ENCOUNTER — Encounter: Payer: Self-pay | Admitting: Cardiovascular Disease

## 2011-01-17 ENCOUNTER — Ambulatory Visit (INDEPENDENT_AMBULATORY_CARE_PROVIDER_SITE_OTHER): Payer: PRIVATE HEALTH INSURANCE | Admitting: Cardiovascular Disease

## 2011-01-17 ENCOUNTER — Encounter: Payer: Self-pay | Admitting: Cardiovascular Disease

## 2011-01-17 DIAGNOSIS — I4891 Unspecified atrial fibrillation: Secondary | ICD-10-CM

## 2011-01-17 DIAGNOSIS — R609 Edema, unspecified: Secondary | ICD-10-CM

## 2011-01-17 DIAGNOSIS — I2510 Atherosclerotic heart disease of native coronary artery without angina pectoris: Secondary | ICD-10-CM

## 2011-01-17 DIAGNOSIS — I501 Left ventricular failure: Secondary | ICD-10-CM

## 2011-01-17 DIAGNOSIS — I10 Essential (primary) hypertension: Secondary | ICD-10-CM

## 2011-01-17 DIAGNOSIS — E119 Type 2 diabetes mellitus without complications: Secondary | ICD-10-CM

## 2011-01-17 DIAGNOSIS — E785 Hyperlipidemia, unspecified: Secondary | ICD-10-CM

## 2011-01-17 MED ORDER — ROSUVASTATIN CALCIUM 20 MG PO TABS: 20.0000 mg | ORAL_TABLET | Freq: Every day | ORAL | Status: DC

## 2011-01-17 MED ORDER — SILDENAFIL CITRATE 100 MG PO TABS: 100.0000 mg | ORAL_TABLET | Freq: Every day | ORAL | Status: AC | PRN

## 2011-01-17 NOTE — Assessment & Plan Note (Signed)
Diabetes is well controlled on his current medication regimen. Have congratulated him on his recent weight loss.

## 2011-01-17 NOTE — Assessment & Plan Note (Signed)
Blood pressure is well controlled on today's visit. No changes made to the medications. 

## 2011-01-17 NOTE — Assessment & Plan Note (Signed)
Bilateral edema of the legs is likely from venous insufficiency. History of DVT of the left, vein harvesting on the right. Recommend leg elevation and TED hose, Lasix p.r.n. For worsening edema.

## 2011-01-17 NOTE — Assessment & Plan Note (Signed)
Cholesterol is well above goal. We will increase his Crestor to 20 mg daily and encourage him to take this. There's been a big change from his labs in March to June.

## 2011-01-17 NOTE — Assessment & Plan Note (Signed)
He is maintaining normal sinus rhythm on his current medication regimen.

## 2011-01-17 NOTE — Progress Notes (Signed)
Patient ID: Christian [name], male    DOB: [birth year], 70 y.o.   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 History of some medication noncompliance.  Since his cardioversion, he has maintained normal sinus rhythm. Warfarin has been discontinued. He has had active weight loss, with 27 pound decrease in weight over the past 7 months. He is closely watching what he eats. Hemoglobin A1c 6.1. He continues to take metformin b.i.d..   His cholesterol has significantly climbed since March. He reports that he has been eating poorly, more hamburgers and hopped arch. He has been cutting his Crestor in half. Uncertain if he has been compliant on Crestor 5 mg. Previous dose of cholesterol in March was 10 mg.   He continues to take Lasix to times a week, has chronic lower extremity edema. History of DVT on the left.   Stress test in February 2009 showed ischemia in the apical and inferolateral walls with ejection fraction 66%.   Echocardiogram March 2009 showed ejection fraction 50-55%, mild aortic stenosis, mild pulmonary hypertension with right ventricular systolic pressure estimated at 32 mm of mercury, aortic valve area of 1.7 cm, mildly dilated left atrium   Cardiac catheterization March 2009 showed 60% left main disease, 25% LAD disease, 50% circumflex disease, 40% OM1 disease, 30% OM 2 disease, 100% RCA disease, patent bypass grafts with a saphenous vein graft to the OM 2, LIMA to the LAD, graft to the PDA   EKG : normal sinus rhythm with rate of 61 beats per minute, Nonspecific ST and T-wave changes in  the anterior and lateral precordial leads    Outpatient Encounter Prescriptions as of 01/17/2011  Medication Sig Dispense Refill  . aspirin 81 MG tablet Take 162 mg by mouth daily.       . celecoxib (CELEBREX) 100 MG capsule Take 1 capsule (100 mg total) by mouth daily.  30 capsule  2  . digoxin (LANOXIN) 0.125 MG tablet Take 125 mcg by mouth daily.        . furosemide (LASIX) 40 MG tablet Take 40 mg by mouth daily.        Marland Kitchen glucose blood (ONE TOUCH ULTRA TEST) test strip Use as instructed  50 each  5  . lisinopril (PRINIVIL,ZESTRIL) 10 MG tablet Take 10 mg by mouth daily.        . metFORMIN (GLUCOPHAGE) 500 MG tablet Take 500 mg by mouth 2 (two) times daily.       Marland Kitchen omeprazole (PRILOSEC) 20 MG capsule Take 20 mg by mouth daily.       . potassium chloride (KLOR-CON) 20 MEQ packet Take 20 mEq by mouth daily.        . rosuvastatin (CRESTOR) 5 MG tablet Take 1 tablet (20 mg total) by mouth daily.  90 tablet  4  . sotalol (BETAPACE) 120 MG tablet Take 120 mg by mouth 2 (two) times daily.        . sildenafil (VIAGRA) 100 MG tablet Take 1 tablet (100 mg total) by mouth daily as needed for erectile dysfunction.  90 tablet  3     Review of Systems  Constitutional:  Negative.   HENT: Negative.   Eyes: Negative.   Respiratory: Negative.   Cardiovascular: Negative.   Gastrointestinal: Negative.   Musculoskeletal: Positive for back pain.  Skin: Negative.   Neurological: Negative.   Hematological: Negative.   Psychiatric/Behavioral: Negative.   All other systems reviewed and are negative.    BP 149/82  Pulse 61  Ht 5\' 9"  (1.753 m)  Wt 198 lb (89.812 kg)  BMI 29.24 kg/m2  Physical Exam  Nursing note and vitals reviewed. Constitutional: He is oriented to person, place, and time. He appears well-developed and well-nourished.  HENT:  Head: Normocephalic.  Nose: Nose normal.  Mouth/Throat: Oropharynx is clear and moist.  Eyes: Conjunctivae are normal. Pupils are equal, round, and  reactive to light.  Neck: Normal range of motion. Neck supple. No JVD present.  Cardiovascular: Normal rate, regular rhythm, S1 normal, S2 normal, normal heart sounds and intact distal pulses.  Exam reveals no gallop and no friction rub.   No murmur heard.      1+ edema bilaterally below the knees  Pulmonary/Chest: Effort normal and breath sounds normal. No respiratory distress. He has no wheezes. He has no rales. He exhibits no tenderness.  Abdominal: Soft. Bowel sounds are normal. He exhibits no distension. There is no tenderness.  Musculoskeletal: Normal range of motion. He exhibits no edema and no tenderness.  Lymphadenopathy:    He has no cervical adenopathy.  Neurological: He is alert and oriented to person, place, and time. Coordination normal.  Skin: Skin is warm and dry. No rash noted. No erythema.  Psychiatric: He has a normal mood and affect. His behavior is normal. Judgment and thought content normal.           Assessment and Plan

## 2011-01-17 NOTE — Patient Instructions (Signed)
You are doing well. Please increase the crestor to 20 mg daily Please call us if you have new issues that need to be addressed before your next appt.  We will call you for a follow up Appt. In 6 months

## 2011-01-17 NOTE — Assessment & Plan Note (Signed)
Currently with no symptoms of angina. No further workup at this time. Continue current medication regimen. 

## 2011-02-02 ENCOUNTER — Telehealth: Payer: Self-pay

## 2011-02-02 MED ORDER — LISINOPRIL 10 MG PO TABS: 10.0000 mg | ORAL_TABLET | Freq: Every day | ORAL | Status: DC

## 2011-02-02 MED ORDER — DIGOXIN 125 MCG PO TABS: 125.0000 ug | ORAL_TABLET | Freq: Every day | ORAL | Status: DC

## 2011-02-02 NOTE — Telephone Encounter (Signed)
Needs a refill on lisinopril 10 mg take one tablet daily.

## 2011-02-02 NOTE — Telephone Encounter (Signed)
Needs to have digoxin refilled.

## 2011-04-18 ENCOUNTER — Encounter: Payer: Self-pay | Admitting: Family Medicine

## 2011-04-18 ENCOUNTER — Ambulatory Visit (INDEPENDENT_AMBULATORY_CARE_PROVIDER_SITE_OTHER): Payer: PRIVATE HEALTH INSURANCE | Admitting: Family Medicine

## 2011-04-18 VITALS — BP 160/80 | HR 62 | Temp 97.3°F | Wt 192.1 lb

## 2011-04-18 DIAGNOSIS — R609 Edema, unspecified: Secondary | ICD-10-CM

## 2011-04-18 DIAGNOSIS — Z23 Encounter for immunization: Secondary | ICD-10-CM

## 2011-04-18 DIAGNOSIS — G4730 Sleep apnea, unspecified: Secondary | ICD-10-CM

## 2011-04-18 DIAGNOSIS — M549 Dorsalgia, unspecified: Secondary | ICD-10-CM

## 2011-04-18 MED ORDER — FUROSEMIDE 40 MG PO TABS: 20.0000 mg | ORAL_TABLET | Freq: Every day | ORAL | Status: DC

## 2011-04-18 MED ORDER — TRAMADOL HCL 50 MG PO TABS: 50.0000 mg | ORAL_TABLET | Freq: Four times a day (QID) | ORAL | Status: AC | PRN
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
## 2011-04-18 NOTE — Patient Instructions (Addendum)
Please talk to the DME company and tell them you are having trouble with the humidifier.  See if they can work on that and then let me know if your throat and breathing (especially in the morning) doesn't get better.  I would take the tramadol instead of the celebrex.  Take it up to 4 times a day for pain.  It can make you drowsy.  Take 1000mg  of tylenol 3 times a day for pain. Keep using the stocking and your can break the lasix pills in half for the swelling.   Take care.

## 2011-04-19 ENCOUNTER — Encounter: Payer: Self-pay | Admitting: Family Medicine

## 2011-04-19 DIAGNOSIS — R0683 Snoring: Secondary | ICD-10-CM | POA: Insufficient documentation

## 2011-04-19 NOTE — Assessment & Plan Note (Signed)
Continue lasix and stocking.  He hadn't been taking lasix often.  He can dec dose and use more often to see if that helps.

## 2011-04-19 NOTE — Assessment & Plan Note (Signed)
D/w pt about getting humidifier fixed as this is likely affecting sleep, oral sensation and mouth desication.  D/w pt about OSA and CV disease as this could affect BLE edema, he understood.  Call back as needed.

## 2011-04-19 NOTE — Assessment & Plan Note (Signed)
Less of an issue today.  I think this is likely contributing with the relative weakness of hip abductors, due to deconditioning.  Will try tramadol and see if pain is improved to allow for inc in mobility.

## 2011-06-01 ENCOUNTER — Other Ambulatory Visit: Payer: Self-pay | Admitting: Family Medicine

## 2011-06-01 DIAGNOSIS — E119 Type 2 diabetes mellitus without complications: Secondary | ICD-10-CM

## 2011-06-01 DIAGNOSIS — Z79899 Other long term (current) drug therapy: Secondary | ICD-10-CM

## 2011-06-06 ENCOUNTER — Other Ambulatory Visit (INDEPENDENT_AMBULATORY_CARE_PROVIDER_SITE_OTHER): Payer: PRIVATE HEALTH INSURANCE

## 2011-06-06 DIAGNOSIS — Z79899 Other long term (current) drug therapy: Secondary | ICD-10-CM

## 2011-06-06 DIAGNOSIS — E119 Type 2 diabetes mellitus without complications: Secondary | ICD-10-CM

## 2011-06-06 LAB — LIPID PANEL
HDL: 49.6 mg/dL (ref >39.00)
Total CHOL/HDL Ratio: 6
Triglycerides: 115 mg/dL (ref 0.0–149.0)
VLDL: 23 mg/dL (ref 0.0–40.0)

## 2011-06-06 LAB — COMPREHENSIVE METABOLIC PANEL
AST: 20 U/L (ref 0–37)
Albumin: 3.6 g/dL (ref 3.5–5.2)
Alkaline Phosphatase: 74 U/L (ref 39–117)
Potassium: 4.6 mEq/L (ref 3.5–5.1)
Sodium: 144 mEq/L (ref 135–145)
Total Protein: 6.8 g/dL (ref 6.0–8.3)

## 2011-06-09 ENCOUNTER — Ambulatory Visit (INDEPENDENT_AMBULATORY_CARE_PROVIDER_SITE_OTHER): Payer: PRIVATE HEALTH INSURANCE | Admitting: Family Medicine

## 2011-06-09 ENCOUNTER — Encounter: Payer: Self-pay | Admitting: Family Medicine

## 2011-06-09 DIAGNOSIS — R079 Chest pain, unspecified: Secondary | ICD-10-CM

## 2011-06-09 DIAGNOSIS — E785 Hyperlipidemia, unspecified: Secondary | ICD-10-CM

## 2011-06-09 DIAGNOSIS — E119 Type 2 diabetes mellitus without complications: Secondary | ICD-10-CM

## 2011-06-09 DIAGNOSIS — I10 Essential (primary) hypertension: Secondary | ICD-10-CM

## 2011-06-09 DIAGNOSIS — I4891 Unspecified atrial fibrillation: Secondary | ICD-10-CM

## 2011-06-09 NOTE — Patient Instructions (Signed)
See Shirlee Limerick about your referral before you leave today. I want you to take your meds as you are (and don't take the crestor).  If you get chest pain, more short of breath, or feel your heart racing, then go to the ER.  Take care.

## 2011-06-09 NOTE — Progress Notes (Signed)
Rhinorrhea is better on zyrtec.    Diabetes:  Using medications without difficulties:yes but intolerant of >500mg  of metformin Hypoglycemic episodes:no Hyperglycemic episodes:no Feet problems: no Blood Sugars averaging: ~100  osa- has f/u with sleep clinic re: osa soon.  Still with daytime sleepiness.    AF.  History of.  No chest pain.  Prev cardioversion.  He's been more SOB with exertion the last few days.  Asking about handicap sticker.  This is reasonable.  He was not known to be back in AF before today.   Hypertension:    Using medication without problems or lightheadedness: yes Chest pain with exertion:no Edema: some Short of breath: yes, with exertion  Elevated Cholesterol: Using medications without problems:yes Muscle aches: prev yes, Leg pain is better off statin (he didn't tolerate tramadol for pain) Diet compliance:yes Exercise:minimal, limited by sx above  PMH and SH reviewed.   Vital signs, Meds and allergies reviewed.    ROS: See HPI.  Otherwise nontributory.   Recheck pulse ox  94-96% sitting.  GEN: nad, alert and oriented HEENT: mucous membranes moist NECK: supple w/o LA CV: IRR, rate 90-low 100s PULM: ctab, no inc wob ABD: soft, +bs EXT: L leg with edema > R leg, pitting below the knee, similar to prev SKIN: no acute rash  Labs d/w pt.

## 2011-06-10 ENCOUNTER — Ambulatory Visit (INDEPENDENT_AMBULATORY_CARE_PROVIDER_SITE_OTHER): Payer: PRIVATE HEALTH INSURANCE | Admitting: Cardiovascular Disease

## 2011-06-10 ENCOUNTER — Encounter: Payer: Self-pay | Admitting: Cardiovascular Disease

## 2011-06-10 ENCOUNTER — Encounter: Payer: Self-pay | Admitting: Family Medicine

## 2011-06-10 DIAGNOSIS — I2510 Atherosclerotic heart disease of native coronary artery without angina pectoris: Secondary | ICD-10-CM

## 2011-06-10 DIAGNOSIS — E785 Hyperlipidemia, unspecified: Secondary | ICD-10-CM

## 2011-06-10 DIAGNOSIS — R0602 Shortness of breath: Secondary | ICD-10-CM

## 2011-06-10 DIAGNOSIS — R609 Edema, unspecified: Secondary | ICD-10-CM

## 2011-06-10 DIAGNOSIS — E119 Type 2 diabetes mellitus without complications: Secondary | ICD-10-CM

## 2011-06-10 DIAGNOSIS — I4891 Unspecified atrial fibrillation: Secondary | ICD-10-CM

## 2011-06-10 NOTE — Assessment & Plan Note (Addendum)
He appeared depressed today with poor concentration and inability to provide an accurate history. His girlfriend provided most details. Continued weight loss is concerning for depression.  We have asked him to take his sotalol and digoxin as prescribed for rate control. We will start pradaxa 150 mg b.i.d.. He has been noncompliant with Lasix and we have suggested he take this on a regular basis. We did recommend he have a visiting nurse help [place] with medication titration. He is reluctant to have a set this up. I suspect there is significant medication noncompliance issues. I would be hesitant to cardiovert him if noncompliance is an issue with anticoagulation or sotalol.

## 2011-06-10 NOTE — Assessment & Plan Note (Signed)
Now back in AF.  EKG reviewed.  D/w pt.  He is to see Dr. Mariah Milling in near future.  I will defer anticoag to cards.  I appreciate cards input.  Continue with current meds, to ER if CP, more SOB, sig inc in pulse.  Pt agrees.  Appears okay for outpatient f/u.  He should be able to tolerate the current rate.

## 2011-06-10 NOTE — Progress Notes (Signed)
Patient ID: TRASEAN DELIMA, male    DOB: 1941-01-23, 70 y.o.   MRN: [id number]  HPI Comments: Mr. 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 History of some medication noncompliance in the past.  Since his cardioversion, he has maintained normal sinus rhythm. Warfarin has been discontinued. He has had active weight loss. Since his last clinic visit, his weight is down an additional 10 pounds. He had a 27 pound weight loss prior to this. He reports that he is now eating more.  Today he is unable to provide much history and does not seem to report new onset shortness of breath. Attempts to try to determine what symptoms he has unsuccessful. His girlfriend seems to think he has several days of shortness of breath. He comments that he just does not feel well though does not know how long this has been going on. He complains about Lasix that it makes him urinate frequently and runs his day. He continues to have significant lower extremity edema. He denies any tachycardia or palpitations.   Stress test in February 2009 showed ischemia in the apical and inferolateral walls with ejection fraction 66%.   Echocardiogram March 2009 showed ejection fraction 50-55%, mild aortic stenosis, mild pulmonary hypertension with right ventricular systolic pressure estimated at 32 mm of mercury, aortic valve area of 1.7 cm, mildly dilated left atrium   Cardiac catheterization March 2009 showed 60% left main disease, 25% LAD disease, 50% circumflex disease, 40% OM1 disease, 30% OM 2 disease, 100% RCA disease, patent bypass grafts with a saphenous vein  graft to the OM 2, LIMA to the LAD, graft to the PDA   EKG : Atrial fibrillation with ventricular rate 98 beats per minute, nonspecific ST abnormality in V4 through V6, one and aVL   Outpatient Encounter Prescriptions as of 06/10/2011  Medication Sig Dispense Refill  . aspirin 81 MG tablet Take 162 mg by mouth daily.       . cetirizine (ZYRTEC) 10 MG tablet Take 10 mg by mouth daily.        . digoxin (LANOXIN) 0.125 MG tablet Take 1 tablet (125 mcg total) by mouth daily.  30 tablet  6  . furosemide (LASIX) 40 MG tablet Take 0.5-1 tablets (20-40 mg total) by mouth daily. For swelling      . glucose blood (ONE TOUCH ULTRA TEST) test strip Use as instructed  50 each  5  . lisinopril (PRINIVIL,ZESTRIL) 10 MG tablet Take 1 tablet (10 mg total) by mouth daily.  30 tablet  6  . metFORMIN (GLUCOPHAGE) 500 MG tablet Take 500 mg by mouth daily. Intolerant of >500mg  per day      . omeprazole (PRILOSEC) 20 MG capsule Take 20 mg by mouth daily.       . potassium chloride (KLOR-CON) 20 MEQ packet Take 20 mEq by mouth daily.        . sotalol (BETAPACE) 120 MG tablet Take 120 mg by mouth 2 (two) times daily.        . dabigatran (PRADAXA) 150 MG CAPS Take 1 capsule (150 mg total) by mouth every 12 (twelve) hours.  60 capsule    ,ed  Review of Systems  Constitutional: Positive for fatigue.  HENT: Negative.   Eyes: Negative.   Respiratory: Negative.   Cardiovascular: Positive for leg swelling.  Gastrointestinal: Negative.   Musculoskeletal: Positive for back pain.  Skin: Negative.   Neurological: Negative.   Hematological: Negative.   Psychiatric/Behavioral: Positive for dysphoric mood and decreased concentration.  All other systems reviewed and are negative.    BP 130/86  Pulse 98  Ht 5\' 10"  (1.778 m)  Wt 189 lb (85.73 kg)  BMI 27.12 kg/m2  Physical Exam  Nursing note and vitals reviewed. Constitutional: He is oriented to person, place, and time. He appears well-developed and  well-nourished.  HENT:  Head: Normocephalic.  Nose: Nose normal.  Mouth/Throat: Oropharynx is clear and moist.  Eyes: Conjunctivae are normal. Pupils are equal, round, and reactive to light.  Neck: Normal range of motion. Neck supple. No JVD present.  Cardiovascular: Normal rate, regular rhythm, S1 normal, S2 normal, normal heart sounds and intact distal pulses.  Exam reveals no gallop and no friction rub.   No murmur heard.      1+ edema bilaterally below the knees  Pulmonary/Chest: Effort normal and breath sounds normal. No respiratory distress. He has no wheezes. He has no rales. He exhibits no tenderness.  Abdominal: Soft. Bowel sounds are normal. He exhibits no distension. There is no tenderness.  Musculoskeletal: Normal range of motion. He exhibits no edema and no tenderness.  Lymphadenopathy:    He has no cervical adenopathy.  Neurological: He is alert and oriented to person, place, and time. Coordination normal.  Skin: Skin is warm and dry. No rash noted. No erythema.  Psychiatric: He has a normal mood and affect. His behavior is normal. Judgment and thought content normal.           Assessment and Plan

## 2011-06-10 NOTE — Assessment & Plan Note (Signed)
No change in meds today

## 2011-06-10 NOTE — Patient Instructions (Addendum)
Start pradaxa 150 mg twice a day for blood thinner Start lasix 40 mg one a day   Continue your other medications  Weight everyday Call the office for a weight drop of more than 5 pounds  Please call us if you have new issues that need to be addressed before your next appt.  The office will contact you for a follow up Appt. In 2 weeks

## 2011-06-10 NOTE — Assessment & Plan Note (Signed)
I suspect there is some medication noncompliance issues. Cholesterol continues to be poorly controlled. He should be on Crestor daily.

## 2011-06-10 NOTE — Assessment & Plan Note (Signed)
Intolerant of crestor, this was tabled in light of AF today.  Consider alt statin in future.

## 2011-06-10 NOTE — Assessment & Plan Note (Signed)
Shortness of breath likely secondary to atrial fibrillation with poor rate control, fluid overload and deconditioning.

## 2011-06-10 NOTE — Assessment & Plan Note (Signed)
A1c okay, no change in meds.

## 2011-06-10 NOTE — Assessment & Plan Note (Signed)
We have suggested he take his Lasix on a more regular basis. He is bothered polyuria and has not been taking the diuretic.

## 2011-06-10 NOTE — Assessment & Plan Note (Signed)
Hemoglobin A1c is well controlled. He has had significant weight loss. If weight continues to drop,  medications will probably need to be discontinued.

## 2011-06-10 NOTE — Assessment & Plan Note (Signed)
Currently with no symptoms of angina. No further workup at this time. Continue current medication regimen. 

## 2011-06-16 ENCOUNTER — Encounter: Payer: Self-pay | Admitting: Cardiovascular Disease

## 2011-06-23 ENCOUNTER — Telehealth: Payer: Self-pay

## 2011-06-23 MED ORDER — SOTALOL HCL 120 MG PO TABS: 120.0000 mg | ORAL_TABLET | Freq: Two times a day (BID) | ORAL | Status: DC

## 2011-06-23 NOTE — Telephone Encounter (Signed)
Refill sent for sotalol hcl 120 mg take one tablet bid.

## 2011-06-28 ENCOUNTER — Ambulatory Visit (INDEPENDENT_AMBULATORY_CARE_PROVIDER_SITE_OTHER): Payer: PRIVATE HEALTH INSURANCE | Admitting: Cardiovascular Disease

## 2011-06-28 ENCOUNTER — Encounter: Payer: Self-pay | Admitting: Cardiovascular Disease

## 2011-06-28 ENCOUNTER — Telehealth: Payer: Self-pay | Admitting: Cardiovascular Disease

## 2011-06-28 DIAGNOSIS — I4891 Unspecified atrial fibrillation: Secondary | ICD-10-CM

## 2011-06-28 DIAGNOSIS — E785 Hyperlipidemia, unspecified: Secondary | ICD-10-CM

## 2011-06-28 DIAGNOSIS — R410 Disorientation, unspecified: Secondary | ICD-10-CM | POA: Insufficient documentation

## 2011-06-28 DIAGNOSIS — I509 Heart failure, unspecified: Secondary | ICD-10-CM

## 2011-06-28 DIAGNOSIS — I10 Essential (primary) hypertension: Secondary | ICD-10-CM

## 2011-06-28 DIAGNOSIS — R0602 Shortness of breath: Secondary | ICD-10-CM

## 2011-06-28 DIAGNOSIS — R634 Abnormal weight loss: Secondary | ICD-10-CM

## 2011-06-28 DIAGNOSIS — I2510 Atherosclerotic heart disease of native coronary artery without angina pectoris: Secondary | ICD-10-CM

## 2011-06-28 DIAGNOSIS — I5031 Acute diastolic (congestive) heart failure: Secondary | ICD-10-CM | POA: Insufficient documentation

## 2011-06-28 DIAGNOSIS — R609 Edema, unspecified: Secondary | ICD-10-CM

## 2011-06-28 MED ORDER — TORSEMIDE 20 MG PO TABS: 20.0000 mg | ORAL_TABLET | Freq: Every day | ORAL | Status: DC

## 2011-06-28 MED ORDER — DABIGATRAN ETEXILATE MESYLATE 150 MG PO CAPS: 150.0000 mg | ORAL_CAPSULE | Freq: Two times a day (BID) | ORAL | Status: DC

## 2011-06-28 NOTE — Assessment & Plan Note (Signed)
Some of his edema is secondary to previous DVT, venous insufficiency. Some is secondary to mild volume overload.

## 2011-06-28 NOTE — Progress Notes (Signed)
Patient ID: Christian [name], male    DOB: 25-Sep-1940, 70 y.o.   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  Review of the notes suggest 50 pound weight loss over the past year. His girlfriend reports he does not eat. Christian [name] reports anorexia, lack of interest, food getting stuck in his throat, difficulty swallowing dry mouth, periods of hallucinations and memory problems. He has been noncompliant with his Lasix as he does not like to Urinate.   He reports taking Lasix one half dose to 2-3 times per week. He has significant shortness of breath with walking such as we're going to get the mail.  He does not like taking Lasix as he reports having uncontrollable urination the next day.   He has not been taking his cholesterol medication. Recent cholesterol is 285, LDL 222   Stress test in February 2009 showed ischemia in the apical and inferolateral walls with ejection fraction 66%.   Echocardiogram March 2009 showed ejection fraction 50-55%, mild aortic stenosis, mild pulmonary hypertension with right ventricular systolic pressure estimated at 32 mm of mercury, aortic valve area of 1.7 cm, mildly dilated left atrium   Cardiac catheterization March 2009 showed 60% left main disease, 25% LAD disease, 50% circumflex disease, 40% OM1 disease, 30% OM 2 disease, 100% RCA disease, patent bypass grafts with a saphenous vein graft to the OM 2, LIMA to the LAD, graft to the PDA   EKG : Atrial fibrillation with ventricular rate 87 beats per minute, nonspecific ST abnormality in V4 through V6, one and  aVL   Outpatient Encounter Prescriptions as of 06/28/2011  Medication Sig Dispense Refill  . aspirin 81 MG tablet Take 162 mg by mouth daily.       . cetirizine (ZYRTEC) 10 MG tablet Take 10 mg by mouth daily.        . dabigatran (PRADAXA) 150 MG CAPS Take 1 capsule (150 mg total) by mouth every 12 (twelve) hours.  60 capsule  6  . digoxin (LANOXIN) 0.125 MG tablet Take 1 tablet (125 mcg total) by mouth daily.  30 tablet  6  . furosemide (LASIX) 40 MG tablet Take 0.5-1 tablets (20-40 mg total) by mouth daily. For swelling      . glucose blood (ONE TOUCH ULTRA TEST) test strip Use as instructed  50 each  5  . lisinopril (PRINIVIL,ZESTRIL) 10 MG tablet Take 1 tablet (10 mg total) by mouth daily.  30 tablet  6  . metFORMIN (GLUCOPHAGE) 500 MG tablet Take 500 mg by mouth daily. Intolerant of >500mg  per day      . omeprazole (PRILOSEC) 20 MG capsule Take 20 mg by mouth daily.       . potassium chloride (KLOR-CON) 20 MEQ packet Take 20 mEq by mouth daily.        . sotalol (BETAPACE) 120 MG tablet Take 1 tablet (120 mg total) by mouth 2 (two) times daily.  60 tablet  6  . torsemide (DEMADEX) 20 MG tablet Take 1 tablet (20 mg total) by mouth daily.  90 tablet  3     Review of Systems  Constitutional: Positive  for fatigue.  HENT: Negative.   Eyes: Negative.   Respiratory: Positive for shortness of breath.   Cardiovascular: Positive for leg swelling.  Gastrointestinal: Negative.   Musculoskeletal: Positive for back pain.  Skin: Negative.   Neurological: Negative.   Hematological: Negative.   Psychiatric/Behavioral: Positive for hallucinations, confusion, sleep disturbance, dysphoric mood and decreased concentration.  All other systems reviewed and are negative.    BP 124/68  Pulse 87  Ht 5\' 9"  (1.753 m)  Wt 186 lb 12 oz (84.709 kg)  BMI 27.58 kg/m2  Physical Exam  Nursing note and vitals reviewed. Constitutional: He is oriented to person, place, and time. He appears well-developed  and well-nourished.  HENT:  Head: Normocephalic.  Nose: Nose normal.  Mouth/Throat: Oropharynx is clear and moist.  Eyes: Conjunctivae are normal. Pupils are equal, round, and reactive to light.  Neck: Normal range of motion. Neck supple. No JVD present.  Cardiovascular: Normal rate, S1 normal, S2 normal and intact distal pulses.  An irregularly irregular rhythm present. Exam reveals no gallop and no friction rub.   Murmur heard.  Crescendo systolic murmur is present with a grade of 2/6       2+ edema bilaterally below the knees, worse on the left  Pulmonary/Chest: Effort normal and breath sounds normal. No respiratory distress. He has no wheezes. He has no rales. He exhibits no tenderness.  Abdominal: Soft. Bowel sounds are normal. He exhibits no distension. There is no tenderness.  Musculoskeletal: Normal range of motion. He exhibits edema. He exhibits no tenderness.  Lymphadenopathy:    He has no cervical adenopathy.  Neurological: He is alert and oriented to person, place, and time. Coordination normal.  Skin: Skin is warm and dry. No rash noted. No erythema.  Psychiatric: He has a normal mood and affect. His behavior is normal. Judgment and thought content normal.           Assessment and Plan

## 2011-06-28 NOTE — Telephone Encounter (Signed)
Pt was seen this am and has some medication questions.

## 2011-06-28 NOTE — Telephone Encounter (Signed)
Pt had question regarding sotalol, notified per med list he is to take 1 tab BID. We have also ordered home health for pt due to polypharmacy and med non-compliance.

## 2011-06-28 NOTE — Assessment & Plan Note (Signed)
Blood pressure is well controlled on today's visit. No changes made to the medications. 

## 2011-06-28 NOTE — Patient Instructions (Addendum)
You are doing well. Please take torsemide daily instead of lasix   Please call us if you have new issues that need to be addressed before your next appt.  The office will contact you for a follow up Appt. In 3 months

## 2011-06-28 NOTE — Assessment & Plan Note (Signed)
There is some medication compliance issues. We have suggested he try torsemide. I suspect his polyuria is secondary to underlying prostate issues. Shortness of breath should improve with diuresis. We will be unable to cardiovert him successfully without better diuresis as atrial fibrillation would recur.

## 2011-06-28 NOTE — Assessment & Plan Note (Signed)
The past several visits to the office, he has appeared withdrawn, confused, needing higher level of help. In the setting of anorexia, weight loss, inability to communicate well and provide a history, this is concerning. Uncertain if he needs additional workup. We'll probably need to watch him closely.

## 2011-06-28 NOTE — Assessment & Plan Note (Signed)
He is not taking a statin at this time. Given his presentation, including memory problems, weight loss, wide range of complaints, I'm hesitant to restart the medication. He ideally needs strict cholesterol control given underlying coronary artery disease.

## 2011-06-28 NOTE — Assessment & Plan Note (Signed)
Currently with no symptoms of angina. No further workup at this time.   

## 2011-06-28 NOTE — Assessment & Plan Note (Signed)
His 50 pound weight loss in the past year is very concerning. He has anorexia, confusion, difficulty swallowing, memory problems, hallucinations. Uncertain if there is underlying neuro cognitive disorder. Do not think this is major depressive disorder though certainly should be considered.

## 2011-06-28 NOTE — Assessment & Plan Note (Signed)
Continued atrial fibrillation. Rate control is adequate.  Shortness of breath is likely from atrial fibrillation and fluid overload. He does not like taking Lasix. We will try torsemide and have suggested he take this more frequently then twice a week as he was doing with Lasix.

## 2011-06-28 NOTE — Telephone Encounter (Signed)
Attempted to contact pt, LMOM TCB.  

## 2011-06-29 ENCOUNTER — Telehealth: Payer: Self-pay | Admitting: *Deleted

## 2011-06-29 ENCOUNTER — Other Ambulatory Visit: Payer: Self-pay | Admitting: Cardiovascular Disease

## 2011-06-29 DIAGNOSIS — I2510 Atherosclerotic heart disease of native coronary artery without angina pectoris: Secondary | ICD-10-CM

## 2011-06-29 DIAGNOSIS — R0602 Shortness of breath: Secondary | ICD-10-CM

## 2011-06-29 NOTE — Telephone Encounter (Signed)
Pt seen yesterday in office, h/o CAD, CABG x3 at [facility] in 2009, catheterization in March 2009 showing patent grafts. He reported having "chest discomfort yesterday evening, felt like something was sitting on my chest." Lasted about 2 hrs, and did have SOB as well. Pt says he has not had CP prior to this, he did take 1 NTG when this occurred and CP resolved. Told pt to call if recurrent symptoms. If take 3 NTG and still have CP, call 911. Will forward to Dr. Mariah Milling to make aware and for further rec's.

## 2011-06-29 NOTE — Telephone Encounter (Signed)
He could have cardiac enz if he would like to see if heart was involved. Could be done in the hospital  (CK, CKMB and TNT)

## 2011-06-29 NOTE — Telephone Encounter (Signed)
Spoke to pt, he will either go today or tomorrow to [facility] for labs. Order faxed.

## 2011-06-30 ENCOUNTER — Telehealth: Payer: Self-pay | Admitting: *Deleted

## 2011-06-30 NOTE — Telephone Encounter (Signed)
Notified pt and left msg with daughter that his cardiac enzymes drawn at [facility] 06/29/11 were normal. Results not scanned at this time but will obtain and scan. CK 49, CK-MB 3.2, TNT 0.03. Per Dr. Mariah Milling, notified pt to call office if Chest pain returns, we can do stress test if needed. Pt states he will call if recurrent.

## 2011-07-03 ENCOUNTER — Telehealth: Payer: Self-pay | Admitting: Family Medicine

## 2011-07-03 NOTE — Telephone Encounter (Signed)
Call pt to set up OV in 1/13.  I want to recheck his weight and talk to him about memory, mood, etc per Dr. Windell Hummingbird note at cards.  Thanks.

## 2011-07-07 NOTE — Telephone Encounter (Signed)
Left message for pt to call.

## 2011-07-07 NOTE — Telephone Encounter (Signed)
Appt scheduled

## 2011-07-20 ENCOUNTER — Ambulatory Visit (INDEPENDENT_AMBULATORY_CARE_PROVIDER_SITE_OTHER): Payer: PRIVATE HEALTH INSURANCE | Admitting: Family Medicine

## 2011-07-20 ENCOUNTER — Encounter: Payer: Self-pay | Admitting: Family Medicine

## 2011-07-20 ENCOUNTER — Ambulatory Visit (INDEPENDENT_AMBULATORY_CARE_PROVIDER_SITE_OTHER)
Admission: RE | Admit: 2011-07-20 | Discharge: 2011-07-20 | Disposition: A | Discharge status: Home / Self Care | Payer: PRIVATE HEALTH INSURANCE | Source: Ambulatory Visit | Attending: Family Medicine | Admitting: Family Medicine

## 2011-07-20 DIAGNOSIS — R634 Abnormal weight loss: Secondary | ICD-10-CM

## 2011-07-20 DIAGNOSIS — R410 Disorientation, unspecified: Secondary | ICD-10-CM

## 2011-07-20 DIAGNOSIS — I4891 Unspecified atrial fibrillation: Secondary | ICD-10-CM

## 2011-07-20 DIAGNOSIS — R1310 Dysphagia, unspecified: Secondary | ICD-10-CM

## 2011-07-20 DIAGNOSIS — R0609 Other forms of dyspnea: Secondary | ICD-10-CM

## 2011-07-20 DIAGNOSIS — R0989 Other specified symptoms and signs involving the circulatory and respiratory systems: Secondary | ICD-10-CM

## 2011-07-20 DIAGNOSIS — F29 Unspecified psychosis not due to a substance or known physiological condition: Secondary | ICD-10-CM

## 2011-07-20 MED ORDER — GLUCOSE BLOOD VI STRP: ORAL_STRIP | Status: DC

## 2011-07-20 NOTE — Progress Notes (Signed)
He has mult concerns, symptoms.    40-50 lbs weight loss in last year.    difficulty swallowing- "for awhile", likely 2-3 months, with dysphagia to pills and liquids.    memory problems.  Unclear if this is statin related or due to betablocker, or other source.   Hallucinations- on prev meds, resolved after stopping offending agents, ie tramadol   Anorexia- this may be related to dysphagia as above.    Muscle aches are better of the cholesterol medicine.    Hearing difficulty has made interaction harder and that may have led to some of the confusion and withdrawal in social situations.    Still in AF.  Still with some DOE.  Taking torsemide.    Constipation.  Going on for months.  May not have a BM for several days.    PMH and SH reviewed  ROS: See HPI, otherwise noncontributory.  Meds, vitals, and allergies reviewed.   nad ncat Mmm IRR, not tachy ctab No inc in wob Abd soft Ext with 1-2+ BLE edema Gross motor wnl, A&O with 2/3 on recall

## 2011-07-20 NOTE — Patient Instructions (Signed)
Let me get your labs back along with the xray and we'll notify you.  Don't change your meds for now.

## 2011-07-21 ENCOUNTER — Encounter: Payer: Self-pay | Admitting: Family Medicine

## 2011-07-21 ENCOUNTER — Other Ambulatory Visit: Payer: Self-pay | Admitting: *Deleted

## 2011-07-21 DIAGNOSIS — R1310 Dysphagia, unspecified: Secondary | ICD-10-CM | POA: Insufficient documentation

## 2011-07-21 LAB — COMPREHENSIVE METABOLIC PANEL
ALT: 13 U/L (ref 0–53)
BUN: 21 mg/dL (ref 6–23)
CO2: 42 mEq/L — ABNORMAL HIGH (ref 19–32)
Creatinine, Ser: 1.3 mg/dL (ref 0.4–1.5)
GFR: 60.14 mL/min (ref >60.00)
Glucose, Bld: 81 mg/dL (ref 70–99)
Total Bilirubin: 0.4 mg/dL (ref 0.3–1.2)

## 2011-07-21 LAB — CBC WITH DIFFERENTIAL/PLATELET
Basophils Absolute: 0 10*3/uL (ref 0.0–0.1)
Basophils Relative: 0.2 % (ref 0.0–3.0)
HCT: 41.8 % (ref 39.0–52.0)
Hemoglobin: 13.1 g/dL (ref 13.0–17.0)
Lymphs Abs: 1.2 10*3/uL (ref 0.7–4.0)
Monocytes Relative: 6.9 % (ref 3.0–12.0)
Neutro Abs: 5.5 10*3/uL (ref 1.4–7.7)
RDW: 15.8 % — ABNORMAL HIGH (ref 11.5–14.6)

## 2011-07-21 LAB — VITAMIN B12: Vitamin B-12: 291 pg/mL (ref 211–911)

## 2011-07-21 LAB — FOLATE: Folate: 11.9 ng/mL (ref >5.9)

## 2011-07-21 MED ORDER — METFORMIN HCL 500 MG PO TABS: 500.0000 mg | ORAL_TABLET | Freq: Two times a day (BID) | ORAL | Status: DC

## 2011-07-21 NOTE — Assessment & Plan Note (Signed)
Check initial labs, but if those are unrevealing, then it would be reasonable to have pt see GI about this.  See below.

## 2011-07-21 NOTE — Assessment & Plan Note (Signed)
Some of this may be exacerbated by poor hearing. Some of the offending meds have been removed, ie tramadol.  Will check labs.  If unremarkable, we'll address this along with the dysphagia.  >25 min spent with face to face with patient, >50% counseling and/or coordinating care.

## 2011-07-21 NOTE — Assessment & Plan Note (Signed)
Per cards  

## 2011-07-22 LAB — BRAIN NATRIURETIC PEPTIDE: Pro B Natriuretic peptide (BNP): 237 pg/mL — ABNORMAL HIGH (ref 0.0–100.0)

## 2011-07-24 ENCOUNTER — Telehealth: Payer: Self-pay | Admitting: Family Medicine

## 2011-07-24 DIAGNOSIS — R410 Disorientation, unspecified: Secondary | ICD-10-CM

## 2011-07-24 NOTE — Telephone Encounter (Signed)
Please call pt. His labs are okay.  I want him to see GI about the weight loss and the dysphagia, as these are likely related.  With the prev confusion, I would check a head CT.  I put in the orders for the CT and GI. If he doesn't need the referral per EMR for GI (and he can go without the referral going to [place]), then let me know and I'll cancel the referral.  Thanks.

## 2011-07-25 ENCOUNTER — Encounter: Payer: Self-pay | Admitting: Cardiovascular Disease

## 2011-07-25 NOTE — Telephone Encounter (Signed)
Patient advised.  I asked if I needed to talk with a family member to help him remember but he said he would wait to hear from [facility] and ask her to send paperwork to help him.

## 2011-08-05 ENCOUNTER — Encounter: Payer: Self-pay | Admitting: Internal Medicine

## 2011-08-09 ENCOUNTER — Ambulatory Visit (INDEPENDENT_AMBULATORY_CARE_PROVIDER_SITE_OTHER): Payer: PRIVATE HEALTH INSURANCE | Admitting: Internal Medicine

## 2011-08-09 ENCOUNTER — Encounter: Payer: Self-pay | Admitting: Internal Medicine

## 2011-08-09 ENCOUNTER — Ambulatory Visit (INDEPENDENT_AMBULATORY_CARE_PROVIDER_SITE_OTHER)
Admission: RE | Admit: 2011-08-09 | Discharge: 2011-08-09 | Disposition: A | Discharge status: Home / Self Care | Payer: PRIVATE HEALTH INSURANCE | Source: Ambulatory Visit | Attending: Family Medicine | Admitting: Family Medicine

## 2011-08-09 ENCOUNTER — Telehealth: Payer: Self-pay | Admitting: Gastroenterology

## 2011-08-09 DIAGNOSIS — F29 Unspecified psychosis not due to a substance or known physiological condition: Secondary | ICD-10-CM

## 2011-08-09 DIAGNOSIS — R634 Abnormal weight loss: Secondary | ICD-10-CM

## 2011-08-09 DIAGNOSIS — K5900 Constipation, unspecified: Secondary | ICD-10-CM

## 2011-08-09 DIAGNOSIS — R1310 Dysphagia, unspecified: Secondary | ICD-10-CM

## 2011-08-09 DIAGNOSIS — R410 Disorientation, unspecified: Secondary | ICD-10-CM

## 2011-08-09 MED ORDER — POLYETHYLENE GLYCOL 3350 17 G PO PACK: 17.0000 g | PACK | Freq: Every day | ORAL | Status: AC

## 2011-08-09 NOTE — Patient Instructions (Signed)
You have been scheduled for an endoscopy. Please follow written instructions given to you at your visit today.  We have sent the following medications to your pharmacy for you to pick up at your convenience: Miralax, you can get this over the counter at your pharmacy.  Take 17g daily.

## 2011-08-09 NOTE — Progress Notes (Signed)
Subjective:    Patient ID: Christian [name], male    DOB: Mar 11, 1941, 71 y.o.   MRN: [id number]  HPI Christian [name] is a 71 yo male with multiple medical problems including CAD, atrial fibrillation, CHF, hypertension, hyperlipidemia, sleep apnea, chronic back pain, diabetes mellitus who seen in consultation at the request of Dr. Para March for evaluation of dysphagia.  The patient is accompanied today to clinic by his daughter.  The patient reports ongoing issues with swallowing trouble. He reports this is been present for many months and maybe up to a year. This does seem to be somewhat intermittent. He reports it can occur with both solids or liquids. And he feels this is worse with cold liquids. He does report frequently feeling the sensation of food getting stuck and the need to regurgitate this back up. He denies odynophagia outside of discomfort when food is sticking. He reports a decrease in his appetite, though at times he feels his appetite is normal. He does report some nausea without vomiting. He does note an approximate 60 pound weight loss in the past year. He feels that some of this is related to diuresis given some volume overload, but doesn't feel it's strictly related to this. He reports ongoing trouble with constipation. He reports moving his bowels approximately once per week. He is having a lot of flatulence. He is using fiber supplementation and MiraLAX rarely.  He reports some lower abdominal cramping which seems to improve with bowel movement. He reports having a colonoscopy done in [place] in 2011. Best he can remember this was normal.  He denies fevers or chills. Not a lot of trouble with heartburn at present.  Review of Systems Constitutional: Negative for fever, chills, night sweats, activity change, appetite change and unexpected weight change HEENT: Negative for sore throat, mouth sores and trouble swallowing. Eyes: Negative for visual disturbance Respiratory: Negative for cough,  chest tightness and positive for shortness of breath Cardiovascular: Negative for chest pain, palpitations and positive for lower extremity swelling Gastrointestinal: See history of present illness Genitourinary: Negative for dysuria and hematuria. Musculoskeletal: Positive for back pain, arthralgias and negative for myalgias Skin: Negative for rash or color change Neurological: Negative for headaches, weakness, numbness Hematological: Negative for adenopathy, negative for easy bruising/bleeding Psychiatric/behavioral: Negative for depressed mood, negative for anxiety   Patient Active Problem List  Diagnoses  . HYPERLIPIDEMIA-MIXED  . HYPERTENSION, BENIGN  . CAD  . ATRIAL FIBRILLATION  . LEFT VENTRICULAR FAILURE  . EDEMA  . SHORTNESS OF BREATH  . Back pain  . Type II or unspecified type diabetes mellitus without mention of complication, not stated as uncontrolled  . Sleep apnea  . Diastolic CHF, acute  . Weight loss, non-intentional  . Mental confusion  . Dysphagia   Past Surgical History  Procedure Date  . Coronary artery bypass graft 2009    x 3 @ [facility]  . Cardiac catheterization 2009    showing patent grafts with elevated pulmonary pressures  . Colonoscopy 2011    negative per report   Current Outpatient Prescriptions  Medication Sig Dispense Refill  . aspirin 81 MG tablet Take 162 mg by mouth daily.       . cetirizine (ZYRTEC) 10 MG tablet Take 10 mg by mouth daily.        . dabigatran (PRADAXA) 150 MG CAPS Take 1 capsule (150 mg total) by mouth every 12 (twelve) hours.  60 capsule  6  . digoxin (LANOXIN) 0.125 MG tablet Take 1 tablet (  125 mcg total) by mouth daily.  30 tablet  6  . glucose blood (ONE TOUCH ULTRA TEST) test strip Use as instructed to check sugar daily  50 each  5  . lisinopril (PRINIVIL,ZESTRIL) 10 MG tablet Take 1 tablet (10 mg total) by mouth daily.  30 tablet  6  . metFORMIN (GLUCOPHAGE) 500 MG tablet Take 1 tablet (500 mg total) by mouth 2 (two)  times daily with a meal. Intolerant of >500mg  per day  180 tablet  3  . nitroGLYCERIN (NITROSTAT) 0.4 MG SL tablet Place 0.4 mg under the tongue every 5 (five) minutes as needed.        Marland Kitchen omeprazole (PRILOSEC) 20 MG capsule Take 20 mg by mouth daily.       . potassium chloride (KLOR-CON) 20 MEQ packet Take 20 mEq by mouth daily.        . sotalol (BETAPACE) 120 MG tablet Take 1 tablet (120 mg total) by mouth 2 (two) times daily.  60 tablet  6  . torsemide (DEMADEX) 20 MG tablet Take 1 tablet (20 mg total) by mouth daily.  90 tablet  3  . polyethylene glycol (MIRALAX / GLYCOLAX) packet Take 17 g by mouth daily.  14 each  0   Allergies  Allergen Reactions  . Crestor (Rosuvastatin Calcium)     myalgias  . Tramadol     Intolerant but not an allergy; sedation   Family History  Problem Relation Age of Onset  . Heart disease Brother   . Arthritis Mother   . Hypertension Mother   . Heart disease Father   . Hyperlipidemia Father   . Stroke Father   . Diabetes Father     Social History  . Marital Status: Divorced    Number of Children: 2   Occupational History  . Retired    Social History Main Topics  . Smoking status: Former Games developer  . Smokeless tobacco: Never Used   Comment: started age 54 quit age 38  . Alcohol Use: No  . Drug Use: No   Social History Narrative   Divorced 20001 son, 1 daughter, both localRetired from [employer]      Objective:   Physical Exam BP 138/70  Pulse 100  Ht 5\' 10"  (1.778 m)  Wt 181 lb (82.101 kg)  BMI 25.97 kg/m2 Constitutional: Well-developed and somewhat thin male in no acute distress. HEENT: Normocephalic and atraumatic. Oropharynx is clear and moist. No oropharyngeal exudate. Conjunctivae are normal. Pupils are equal round and reactive to light. No scleral icterus. Neck: Neck supple. Trachea midline. Cardiovascular: Irregularly irregular with 3/6 systolic ejection murmur  Pulmonary/chest: Effort normal and breath sounds normal. No  wheezing, rales or rhonchi. Abdominal: Soft, nontender, nondistended. Bowel sounds active throughout. There are no masses palpable. No hepatosplenomegaly. Extremities: no clubbing, cyanosis, 2+ LE edema Lymphadenopathy: No cervical adenopathy noted. Neurological: Alert and oriented to person place and time. Skin: Skin is warm and dry. No rashes noted. Psychiatric: Somewhat depressed mood with flat affect. Behavior is normal.  CBC    Component Value Date/Time   WBC 7.4 07/20/2011 1658   RBC 4.96 07/20/2011 1658   HGB 13.1 07/20/2011 1658   HCT 41.8 07/20/2011 1658   PLT 262.0 07/20/2011 1658   MCV 84.2 07/20/2011 1658   MCHC 31.3 07/20/2011 1658   RDW 15.8* 07/20/2011 1658   LYMPHSABS 1.2 07/20/2011 1658   MONOABS 0.5 07/20/2011 1658   EOSABS 0.1 07/20/2011 1658   BASOSABS 0.0 07/20/2011 1658  CMP     Component Value Date/Time   NA 138 07/20/2011 1658   K 4.7 07/20/2011 1658   CL 89* 07/20/2011 1658   CO2 42* 07/20/2011 1658   GLUCOSE 81 07/20/2011 1658   BUN 21 07/20/2011 1658   CREATININE 1.3 07/20/2011 1658   CALCIUM 9.2 07/20/2011 1658   PROT 6.6 07/20/2011 1658   ALBUMIN 3.5 07/20/2011 1658   AST 20 07/20/2011 1658   ALT 13 07/20/2011 1658   ALKPHOS 71 07/20/2011 1658   BILITOT 0.4 07/20/2011 1658      Assessment & Plan:  71 yo male with multiple medical problems including CAD, atrial fibrillation, CHF, hypertension, hyperlipidemia, sleep apnea, chronic back pain, diabetes mellitus who seen in consultation at the request of Dr. Para March for evaluation of dysphagia, also with chronic constipation..  1. Dysphagia -- the patient's dysphagia symptoms combined with weight loss is concerning and warrants further investigation.  The differential includes stricture, mass lesion, infection such as candidiasis, or motility disorder such as presbyesophagus.  We have discussed proceeding with upper endoscopy, including the risks and benefits, and he is willing to proceed.  He will need to have his Pradaxa held for this  procedure, and potentially a few days after should intervention be required.  We will contact his cardiologist  for approval, and he can help with the decision about the necessity of a Lovenox bridge.  For now he will continue on omeprazole 20 mg daily.  2. Constipation -- the patient is rarely using MiraLAX, and I have recommended that he start this medication daily. I have recommended MiraLAX 17 g daily. He can use this up to twice a day and titrate the dose for results. If his stools are too loose, he can take this every other day. Further medical therapy is an option if MiraLAX is not beneficial for him.  I would like to obtain the report of his colonoscopy, reportedly done in 2011 to ensure that his screening is up-to-date (especially in light of his recent weight loss).    Further recommendations after procedure and prior procedures have been reviewed.

## 2011-08-10 ENCOUNTER — Encounter: Payer: Self-pay | Admitting: Family Medicine

## 2011-08-12 ENCOUNTER — Ambulatory Visit: Payer: Self-pay | Admitting: Internal Medicine

## 2011-08-22 ENCOUNTER — Other Ambulatory Visit: Payer: Self-pay | Admitting: *Deleted

## 2011-08-22 MED ORDER — POTASSIUM CHLORIDE 20 MEQ PO PACK: 20.0000 meq | PACK | Freq: Every day | ORAL | Status: DC

## 2011-08-22 MED ORDER — LISINOPRIL 10 MG PO TABS: 10.0000 mg | ORAL_TABLET | Freq: Every day | ORAL | Status: DC

## 2011-08-22 MED ORDER — DIGOXIN 125 MCG PO TABS: 125.0000 ug | ORAL_TABLET | Freq: Every day | ORAL | Status: DC

## 2011-08-29 ENCOUNTER — Telehealth: Payer: Self-pay | Admitting: Internal Medicine

## 2011-08-29 NOTE — Telephone Encounter (Signed)
lmom for daughter to call back

## 2011-08-30 ENCOUNTER — Telehealth: Payer: Self-pay

## 2011-08-30 NOTE — Telephone Encounter (Signed)
Pt's daughter, Jani Gravel called to report pt has a procedure on 09/01/11 and is supposed to hold Pradaxa, but they don't know for how long. Dr Mariah Milling never responded to our letter. Called his ofc and Jasmine December his CMA will ask him about the anticoagulation and call us back. French Ana [phone] or work [phone]. Dr Mariah Milling [phone].

## 2011-08-30 NOTE — Telephone Encounter (Signed)
Informed pt's daughter, Hipolito Bayley that per Dr Mariah Milling, pt may stop the Pradaxa tonight and resume after the procedure; [name] stated understanding.

## 2011-08-30 NOTE — Telephone Encounter (Signed)
Per Dr. Mariah Milling the patient needs to stop pradaxa starting tonight, Feb. 19, 2013 and restart pradaxa post op procedure.

## 2011-08-30 NOTE — Telephone Encounter (Signed)
See next encounter.

## 2011-09-01 ENCOUNTER — Ambulatory Visit (AMBULATORY_SURGERY_CENTER): Payer: PRIVATE HEALTH INSURANCE | Admitting: Internal Medicine

## 2011-09-01 ENCOUNTER — Encounter: Payer: Self-pay | Admitting: Internal Medicine

## 2011-09-01 DIAGNOSIS — R634 Abnormal weight loss: Secondary | ICD-10-CM

## 2011-09-01 DIAGNOSIS — K2970 Gastritis, unspecified, without bleeding: Secondary | ICD-10-CM

## 2011-09-01 DIAGNOSIS — K2990 Gastroduodenitis, unspecified, without bleeding: Secondary | ICD-10-CM

## 2011-09-01 DIAGNOSIS — D130 Benign neoplasm of esophagus: Secondary | ICD-10-CM

## 2011-09-01 DIAGNOSIS — R1310 Dysphagia, unspecified: Secondary | ICD-10-CM

## 2011-09-01 DIAGNOSIS — K5900 Constipation, unspecified: Secondary | ICD-10-CM

## 2011-09-01 MED ORDER — SODIUM CHLORIDE 0.9 % IV SOLN: 500.0000 mL | INTRAVENOUS | Status: DC

## 2011-09-01 MED ORDER — OMEPRAZOLE 20 MG PO CPDR: 40.0000 mg | DELAYED_RELEASE_CAPSULE | Freq: Two times a day (BID) | ORAL | Status: DC

## 2011-09-01 MED ORDER — DEXTROSE 50 % IV SOLN: 25.0000 mL | Freq: Once | INTRAVENOUS | Status: DC

## 2011-09-01 MED ORDER — DEXTROSE 5 % IV SOLN: INTRAVENOUS | Status: DC

## 2011-09-01 NOTE — Patient Instructions (Signed)
YOU HAD AN ENDOSCOPIC PROCEDURE TODAY AT THE [facility]: Refer to the procedure report that was given to you for any specific questions about what was found during the examination.  If the procedure report does not answer your questions, please call your gastroenterologist to clarify.  If you requested that your care partner not be given the details of your procedure findings, then the procedure report has been included in a sealed envelope for you to review at your convenience later.  YOU SHOULD EXPECT: Some feelings of bloating in the abdomen. Passage of more gas than usual.  Walking can help get rid of the air that was put into your GI tract during the procedure and reduce the bloating. If you had a lower endoscopy (such as a colonoscopy or flexible sigmoidoscopy) you may notice spotting of blood in your stool or on the toilet paper. If you underwent a bowel prep for your procedure, then you may not have a normal bowel movement for a few days.  DIET: Your first meal following the procedure should be a light meal and then it is ok to progress to your normal diet.  A half-sandwich or bowl of soup is an example of a good first meal.  Heavy or fried foods are harder to digest and may make you feel nauseous or bloated.  Likewise meals heavy in dairy and vegetables can cause extra gas to form and this can also increase the bloating.  Drink plenty of fluids but you should avoid alcoholic beverages for 24 hours.  ACTIVITY: Your care partner should take you home directly after the procedure.  You should plan to take it easy, moving slowly for the rest of the day.  You can resume normal activity the day after the procedure however you should NOT DRIVE or use heavy machinery for 24 hours (because of the sedation medicines used during the test).    SYMPTOMS TO REPORT IMMEDIATELY: A gastroenterologist can be reached at any hour.  During normal business hours, 8:30 AM to 5:00 PM Monday through Friday,  call [phone].  After hours and on weekends, please call the GI answering service at [phone] who will take a message and have the physician on call contact you.   Following lower endoscopy (colonoscopy or flexible sigmoidoscopy):  Excessive amounts of blood in the stool  Significant tenderness or worsening of abdominal pains  Swelling of the abdomen that is new, acute  Fever of 100F or higher  Following upper endoscopy (EGD)  Vomiting of blood or coffee ground material  New chest pain or pain under the shoulder blades  Painful or persistently difficult swallowing  New shortness of breath  Fever of 100F or higher  Black, tarry-looking stools  FOLLOW UP: If any biopsies were taken you will be contacted by phone or by letter within the next 1-3 weeks.  Call your gastroenterologist if you have not heard about the biopsies in 3 weeks.  Our staff will call the home number listed on your records the next business day following your procedure to check on you and address any questions or concerns that you may have at that time regarding the information given to you following your procedure. This is a courtesy call and so if there is no answer at the home number and we have not heard from you through the emergency physician on call, we will assume that you have returned to your regular daily activities without incident.  SIGNATURES/CONFIDENTIALITY: You and/or your care   partner have signed paperwork which will be entered into your electronic medical record.  These signatures attest to the fact that that the information above on your After Visit Summary has been reviewed and is understood.  Full responsibility of the confidentiality of this discharge information lies with you and/or your care-partner. Gastritis Gastritis is an inflammation (the body's way of reacting to injury and/or infection) of the stomach. It is often caused by viral or bacterial (germ) infections. It can also be caused  by chemicals (including alcohol) and medications. This illness may be associated with generalized malaise (feeling tired, not well), cramps, and fever. The illness may last 2 to 7 days. If symptoms of gastritis continue, gastroscopy (looking into the stomach with a telescope-like instrument), biopsy (taking tissue samples), and/or blood tests may be necessary to determine the cause. Antibiotics will not affect the illness unless there is a bacterial infection present. One common bacterial cause of gastritis is an organism known as H. Pylori. This can be treated with antibiotics. Other forms of gastritis are caused by too much acid in the stomach. They can be treated with medications such as H2 blockers and antacids. Home treatment is usually all that is needed. Young children will quickly become dehydrated (loss of body fluids) if vomiting and diarrhea are both present. Medications may be given to control nausea. Medications are usually not given for diarrhea unless especially bothersome. Some medications slow the removal of the virus from the gastrointestinal tract. This slows down the healing process. HOME CARE INSTRUCTIONS Home care instructions for nausea and vomiting:  For adults: drink small amounts of fluids often. Drink at least 2 quarts a day. Take sips frequently. Do not drink large amounts of fluid at one time. This may worsen the nausea.   Only take over-the-counter or prescription medicines for pain, discomfort, or fever as directed by your caregiver.   Drink clear liquids only. Those are anything you can see through such as water, broth, or soft drinks.   Once you are keeping clear liquids down, you may start full liquids, soups, juices, and ice cream or sherbet. Slowly add bland (plain, not spicy) foods to your diet.  Home care instructions for diarrhea:  Diarrhea can be caused by bacterial infections or a virus. Your condition should improve with time, rest, fluids, and/or anti-diarrheal  medication.   Until your diarrhea is under control, you should drink clear liquids often in small amounts. Clear liquids include: water, broth, jell-o water and weak tea.  Avoid:  Milk.   Fruits.   Tobacco.   Alcohol.   Extremely hot or cold fluids.   Too much intake of anything at one time.  When your diarrhea stops you may add the following foods, which help the stool to become more formed:  Rice.   Bananas.   Apples without skin.   Dry toast.  Once these foods are tolerated you may add low-fat yogurt and low-fat cottage cheese. They will help to restore the normal bacterial balance in your bowel. Wash your hands well to avoid spreading bacteria (germ) or virus. SEEK IMMEDIATE MEDICAL CARE IF:   You are unable to keep fluids down.   Vomiting or diarrhea become persistent (constant).   Abdominal pain develops, increases, or localizes. (Right sided pain can be appendicitis. Left sided pain in adults can be diverticulitis.)   You develop a fever (an oral temperature above 102 F (38.9 C)).   Diarrhea becomes excessive or contains blood or mucus.   You  have excessive weakness, dizziness, fainting or extreme thirst.   You are not improving or you are getting worse.   You have any other questions or concerns.  Document Released: 06/21/2001 Document Revised: 03/09/2011 Document Reviewed: 06/27/2005 [facility] Patient Information 2012 [place], [place].Hiatal Hernia A hiatal hernia occurs when a part of the stomach slides above the diaphragm. The diaphragm is the thin muscle separating the belly (abdomen) from the chest. A hiatal hernia can be something you are born with or develop over time. Hiatal hernias may allow stomach acid to flow back into your esophagus, the tube which carries food from your mouth to your stomach. If this acid causes problems it is called GERD (gastro-esophageal reflux disease).  SYMPTOMS  Common symptoms of GERD are heartburn (burning in your chest).  This is worse when lying down or bending over. It may also cause belching and indigestion. Some of the things which make GERD worse are:  Increased weight pushes on stomach making acid rise more easily.   Smoking markedly increases acid production.   Alcohol decreases lower esophageal sphincter pressure (valve between stomach and esophagus), allowing acid from stomach into esophagus.   Late evening meals and going to bed with a full stomach increases pressure.   Anything that causes an increase in acid production.   Lower esophageal sphincter incompetence.  DIAGNOSIS  Hiatal hernia is often diagnosed with x-rays of your stomach and small bowel. This is called an UGI (upper gastrointestinal x-ray). Sometimes a gastroscopic procedure is done. This is a procedure where your caregiver uses a flexible instrument to look into the stomach and small bowel. HOME CARE INSTRUCTIONS   Try to achieve and maintain an ideal body weight.   Avoid drinking alcoholic beverages.   Stop smoking.   Put the head of your bed on 4 to 6 inch blocks. This will keep your head and esophagus higher than your stomach. If you cannot use blocks, sleep with several pillows under your head and shoulders.   Over-the-counter medications will decrease acid production. Your caregiver can also prescribe medications for this. Take as directed.   1/2 to 1 teaspoon of an antacid taken every hour while awake, with meals and at bedtime, will neutralize acid.   Do not take aspirin, ibuprofen (Advil or Motrin), or other nonsteroidal anti-inflammatory drugs.   Do not wear tight clothing around your chest or stomach.   Eat smaller meals and eat more frequently. This keeps your stomach from getting too full. Eat slowly.   Do not lie down for 2 or 3 hours after eating. Do not eat or drink anything 1 to 2 hours before going to bed.   Avoid caffeine beverages (colas, coffee, cocoa, tea), fatty foods, citrus fruits and all other  foods and drinks that contain acid and that seem to increase the problems.   Avoid bending over, especially after eating. Also avoid straining during bowel movements or when urinating or lifting things. Anything that increases the pressure in your belly increases the amount of acid that may be pushed up into your esophagus.  SEEK IMMEDIATE MEDICAL CARE IF:  There is change in location (pain in arms, neck, jaw, teeth or back) of your pain, or the pain is getting worse.   You also experience nausea, vomiting, sweating (diaphoresis), or shortness of breath.   You develop continual vomiting, vomit blood or coffee ground material, have bright red blood in your stools, or have black tarry stools.  Some of these symptoms could signal other problems  such as heart disease. MAKE SURE YOU:   Understand these instructions.   Monitor your condition.   Contact your caregiver if you are not doing well or are getting worse.  Document Released: 09/17/2003 Document Revised: 03/09/2011 Document Reviewed: 06/27/2005 [facility] Patient Information 2012 [place], [place].Esophagitis Esophagitis (heartburn) is a painful, burning sensation in the chest. It may feel worse in certain positions, such as lying down or bending over. It is caused by stomach acid backing up into the tube that carries food from the mouth down to the stomach (lower esophagus). TREATMENT  There are a number of non-prescription medicines used to treat heartburn, including:  Antacids.   Acid reducers (also called H-2 blockers).   Proton-pump inhibitors.  HOME CARE INSTRUCTIONS   Raise the head of your bead by putting blocks under the legs.   Eat 2-3 hours before going to bed.   Stop smoking.   Try to reach and maintain a healthy weight.   Do not eat just a few very large meals. Instead, eat many smaller meals throughout the day.   Try to identify foods and beverages that make your symptoms worse, and avoid these.   Avoid tight  clothing.   Do not exercise right after eating.  SEEK IMMEDIATE MEDICAL CARE IF:  You have severe chest pain that goes down your arm, or into your jaw or neck.   You feel sweaty, dizzy, or lightheaded.   You are short of breath.   You throw up (vomit) blood.   You have difficulty or pain with swallowing.   You have bloody or black, tarry stools.   You have bouts of heartburn more than three times a week for more than two weeks.  Document Released: 08/04/2004 Document Revised: 01/10/2011 Document Reviewed: 11/05/2008 [facility] Patient Information 2012 [place], [place].

## 2011-09-01 NOTE — Progress Notes (Signed)
Patient did not experience any of the following events: a burn prior to discharge; a fall within the facility; wrong site/side/patient/procedure/implant event; or a hospital transfer or hospital admission upon discharge from the facility. (G8907) Patient did not have preoperative order for IV antibiotic SSI prophylaxis. (G8918)  

## 2011-09-01 NOTE — Op Note (Signed)
[facility] [address] [place], [place]  [address]  ENDOSCOPY PROCEDURE REPORT  PATIENT:  [name], Christian  MR#:  [id number] BIRTHDATE:  1941/02/18, 71 yrs. old  GENDER:  male ENDOSCOPIST:  Carie Caddy. Pyrtle, MD Referred by:  Crawford Givens, M.D. PROCEDURE DATE:  09/01/2011 PROCEDURE:  EGD with biopsy, 43239 ASA CLASS:  Class III INDICATIONS:  dysphagia, weight loss MEDICATIONS:    MAC sedation, administered by CRNA, propofol (Diprivan) 50 mg IV TOPICAL ANESTHETIC:  Cetacaine Spray  DESCRIPTION OF PROCEDURE:   After the risks benefits and alternatives of the procedure were thoroughly explained, informed consent was obtained.  The  endoscope was introduced through the mouth and advanced to the second portion of the duodenum, without limitations.  The instrument was slowly withdrawn as the mucosa was fully examined. <<PROCEDUREIMAGES>>  Moderate to severe, LA Grade D esophagitis was found in the mid and distal esophagus. Multiple biopsies were obtained and sent to pathology.  A small hiatal hernia was found.  Moderate gastritis characterized by erythema and areas of atrophy was found antrum. Multiple biopsies were obtained and sent to pathology.  The duodenal bulb was normal in appearance, as was the postbulbar duodenum.    Retroflexed views revealed findings as previously described.    The scope was then withdrawn from the patient and the procedure completed.  COMPLICATIONS:  None  ENDOSCOPIC IMPRESSION: 1) Severe esophagitis, which is felt to explain trouble swallowing.  Biopsies obtained. 2) Small hiatal hernia 3) Moderate gastritis in the antrum.  Biopsies obtained. 4) Normal duodenum  RECOMMENDATIONS: 1) Increase omeprazole to 40 mg twice daily for at least 12 weeks to promote healing of esophagitis. 2) Repeat EGD in 12 weeks to document healing.  Carie Caddy. Rhea Belton, MD  CC:  Crawford Givens, MD The Patient  n. eSIGNED:   Carie Caddy. Pyrtle at 09/01/2011 09:44  AM  [place], [place], [phone]

## 2011-09-02 ENCOUNTER — Telehealth: Payer: Self-pay | Admitting: *Deleted

## 2011-09-02 ENCOUNTER — Inpatient Hospital Stay: Payer: Self-pay | Admitting: Internal Medicine

## 2011-09-02 ENCOUNTER — Telehealth: Payer: Self-pay | Admitting: Internal Medicine

## 2011-09-02 ENCOUNTER — Telehealth: Payer: Self-pay | Admitting: Family Medicine

## 2011-09-02 LAB — CBC
HCT: 42.2 % (ref 40.0–52.0)
HGB: 12.8 g/dL — ABNORMAL LOW (ref 13.0–18.0)
MCH: 26.1 pg (ref 26.0–34.0)
MCHC: 30.3 g/dL — ABNORMAL LOW (ref 32.0–36.0)
MCV: 86 fL (ref 80–100)
RBC: 4.89 10*6/uL (ref 4.40–5.90)
RDW: 15.5 % — ABNORMAL HIGH (ref 11.5–14.5)

## 2011-09-02 LAB — URINALYSIS, COMPLETE
Bacteria: NONE SEEN
Blood: NEGATIVE
Glucose,UR: NEGATIVE mg/dL (ref 0–75)
Ketone: NEGATIVE
Protein: 30
RBC,UR: 3 /HPF (ref 0–5)
Specific Gravity: 1.014 (ref 1.003–1.030)
WBC UR: 1 /HPF (ref 0–5)

## 2011-09-02 LAB — DIGOXIN LEVEL: Digoxin: 1.44 ng/mL

## 2011-09-02 LAB — CK TOTAL AND CKMB (NOT AT ARMC)
CK, Total: 43 U/L (ref 35–232)
CK, Total: 41 U/L (ref 35–232)
CK-MB: 3.5 ng/mL (ref 0.5–3.6)

## 2011-09-02 LAB — PROTIME-INR
INR: 1
Prothrombin Time: 13.9 secs (ref 11.5–14.7)

## 2011-09-02 LAB — COMPREHENSIVE METABOLIC PANEL
Albumin: 3 g/dL — ABNORMAL LOW (ref 3.4–5.0)
BUN: 13 mg/dL (ref 7–18)
Bilirubin,Total: 0.8 mg/dL (ref 0.2–1.0)
Calcium, Total: 8.9 mg/dL (ref 8.5–10.1)
Chloride: 91 mmol/L — ABNORMAL LOW (ref 98–107)
EGFR (African American): >60
Glucose: 122 mg/dL — ABNORMAL HIGH (ref 65–99)
SGOT(AST): 22 U/L (ref 15–37)
SGPT (ALT): 18 U/L
Total Protein: 6.1 g/dL — ABNORMAL LOW (ref 6.4–8.2)

## 2011-09-02 LAB — TROPONIN I: Troponin-I: 0.08 ng/mL — ABNORMAL HIGH

## 2011-09-02 LAB — LIPASE, BLOOD: Lipase: 151 U/L (ref 73–393)

## 2011-09-02 NOTE — Telephone Encounter (Signed)
Agree with ER/911 eval.

## 2011-09-02 NOTE — Telephone Encounter (Signed)
Hard time getting pt up this am. Very shaky and he cant hold any thing in his hands. He is diabetic but daughter states she hasn't checked his blood sugar. Having trouble remembering. He is light headed. He did eat yesterday after the procedure with no problems. Ate crackers last night, cant hold food to eat this am per daughter.and is equally the same, not to just one side affected. He did take his diabetic medicines last night per daughter.  Accu check was 137 this am while I was on the phone with duaghter. Pt has no bleeding, no vomiting, no diaphoresis. Daughter states this is abnormal for her dad in the mornings. Daughter instructed to call pts PCP and get am appointment and if they are unable to see pt go to the ED.

## 2011-09-02 NOTE — Telephone Encounter (Signed)
Triage Record Num: [id number] Operator: Peri Jefferson Patient Name: Christian [name] Call Date & Time: 09/02/2011 9:33:17AM Patient Phone: [phone] PCP: Crawford Givens Patient Gender: Male PCP Fax : Patient DOB: May 13, 1941 Practice Name: [facility] Reason for Call: Caller: Jennifer/Daughter; PCP: Crawford Givens Clelia Croft); CB#: [phone]; Call regarding [name] states that [name] had an Gastroscopy on 09/01/11. States that this morning, [name] seemed weak. Would raise his arm and just drop it. C/o dizziness. Unable to walk. Sleeping at this time. Utilized Dizziness Guideline. Advised 911. Protocol(s) Used: Dizziness or Vertigo Recommended Outcome per Protocol: Activate EMS 911 Reason for Outcome: New or worsening signs and symptoms that may indicate shock Care Advice: ~ Do not give the patient anything to eat or drink. Lay the person down and elevate legs at least 12 inches (30 cm) above level of heart. Cover to help maintain body temperature. ~ 02/

## 2011-09-02 NOTE — Telephone Encounter (Signed)
Spoke with daughter [name] who stated EMS is at the house now and being assessed. [name] will call back if we can be of help.

## 2011-09-02 NOTE — Telephone Encounter (Signed)
  Follow up Call-  Call back number 09/01/2011  Post procedure Call Back phone  # [phone]  Permission to leave phone message Yes     Patient questions:  Do you have a fever, pain , or abdominal swelling? no Pain Score  0 *  Have you tolerated food without any problems? yes  Have you been able to return to your normal activities? yes  Do you have any questions about your discharge instructions: Diet   no Medications  no Follow up visit  no  Do you have questions or concerns about your Care? no  Actions: * If pain score is 4 or above: No action needed, pain <4.

## 2011-09-02 NOTE — Telephone Encounter (Signed)
Agree with evaluation ASAP by PCP, urgent care of ED

## 2011-09-03 DIAGNOSIS — I059 Rheumatic mitral valve disease, unspecified: Secondary | ICD-10-CM

## 2011-09-03 DIAGNOSIS — R748 Abnormal levels of other serum enzymes: Secondary | ICD-10-CM

## 2011-09-03 DIAGNOSIS — I4891 Unspecified atrial fibrillation: Secondary | ICD-10-CM

## 2011-09-03 LAB — BASIC METABOLIC PANEL
Anion Gap: 5 — ABNORMAL LOW (ref 7–16)
BUN: 11 mg/dL (ref 7–18)
Calcium, Total: 8.5 mg/dL (ref 8.5–10.1)
Chloride: 93 mmol/L — ABNORMAL LOW (ref 98–107)
Co2: 42 mmol/L (ref 21–32)
Creatinine: 1 mg/dL (ref 0.60–1.30)
EGFR (African American): >60
EGFR (Non-African Amer.): >60
Glucose: 84 mg/dL (ref 65–99)
Osmolality: 278 (ref 275–301)

## 2011-09-03 LAB — CBC WITH DIFFERENTIAL/PLATELET
Basophil #: 0 10*3/uL (ref 0.0–0.1)
Eosinophil #: 0.1 10*3/uL (ref 0.0–0.7)
HGB: 11.7 g/dL — ABNORMAL LOW (ref 13.0–18.0)
Lymphocyte #: 0.9 10*3/uL — ABNORMAL LOW (ref 1.0–3.6)
MCH: 26.1 pg (ref 26.0–34.0)
MCHC: 30.4 g/dL — ABNORMAL LOW (ref 32.0–36.0)
Monocyte #: 0.6 10*3/uL (ref 0.0–0.7)
Monocyte %: 11.1 %
Neutrophil #: 3.8 10*3/uL (ref 1.4–6.5)
RBC: 4.46 10*6/uL (ref 4.40–5.90)
RDW: 15.4 % — ABNORMAL HIGH (ref 11.5–14.5)
WBC: 5.5 10*3/uL (ref 3.8–10.6)

## 2011-09-03 LAB — HEMOGLOBIN A1C: Hemoglobin A1C: 6.1 % (ref 4.2–6.3)

## 2011-09-03 LAB — TROPONIN I
Troponin-I: 0.09 ng/mL — ABNORMAL HIGH
Troponin-I: 0.08 ng/mL — ABNORMAL HIGH

## 2011-09-03 LAB — CK TOTAL AND CKMB (NOT AT ARMC)
CK, Total: 28 U/L — ABNORMAL LOW (ref 35–232)
CK-MB: 2.8 ng/mL (ref 0.5–3.6)

## 2011-09-04 LAB — BASIC METABOLIC PANEL
Chloride: 89 mmol/L — ABNORMAL LOW (ref 98–107)
Co2: 42 mmol/L (ref 21–32)
Creatinine: 1.15 mg/dL (ref 0.60–1.30)
EGFR (African American): >60
EGFR (Non-African Amer.): >60
Glucose: 123 mg/dL — ABNORMAL HIGH (ref 65–99)
Potassium: 4.1 mmol/L (ref 3.5–5.1)
Sodium: 139 mmol/L (ref 136–145)

## 2011-09-05 ENCOUNTER — Telehealth: Payer: Self-pay | Admitting: *Deleted

## 2011-09-05 LAB — URINALYSIS, COMPLETE
Nitrite: NEGATIVE
Ph: 8 (ref 4.5–8.0)
Protein: NEGATIVE
Specific Gravity: 1.008 (ref 1.003–1.030)

## 2011-09-05 NOTE — Telephone Encounter (Signed)
lmom for daughter Christian [name] for an update on his condition.

## 2011-09-06 LAB — COMPREHENSIVE METABOLIC PANEL
Albumin: 2.6 g/dL — ABNORMAL LOW (ref 3.4–5.0)
Alkaline Phosphatase: 56 U/L (ref 50–136)
Bilirubin,Total: 0.6 mg/dL (ref 0.2–1.0)
Chloride: 86 mmol/L — ABNORMAL LOW (ref 98–107)
Creatinine: 0.87 mg/dL (ref 0.60–1.30)
Glucose: 88 mg/dL (ref 65–99)
Osmolality: 277 (ref 275–301)
Sodium: 138 mmol/L (ref 136–145)

## 2011-09-06 LAB — CBC WITH DIFFERENTIAL/PLATELET
Basophil #: 0 10*3/uL (ref 0.0–0.1)
Basophil %: 0.3 %
Eosinophil #: 0.1 10*3/uL (ref 0.0–0.7)
Eosinophil %: 2 %
HGB: 11.3 g/dL — ABNORMAL LOW (ref 13.0–18.0)
Lymphocyte #: 0.8 10*3/uL — ABNORMAL LOW (ref 1.0–3.6)
Lymphocyte %: 13.8 %
MCH: 26.2 pg (ref 26.0–34.0)
MCHC: 30.5 g/dL — ABNORMAL LOW (ref 32.0–36.0)
MCV: 86 fL (ref 80–100)
Monocyte #: 0.6 10*3/uL (ref 0.0–0.7)
Neutrophil %: 73.9 %
RDW: 15.3 % — ABNORMAL HIGH (ref 11.5–14.5)

## 2011-09-06 LAB — MAGNESIUM: Magnesium: 1.5 mg/dL — ABNORMAL LOW

## 2011-09-06 LAB — TSH: Thyroid Stimulating Horm: 0.77 u[IU]/mL

## 2011-09-07 DIAGNOSIS — I4891 Unspecified atrial fibrillation: Secondary | ICD-10-CM

## 2011-09-07 NOTE — Telephone Encounter (Signed)
Spoke with pt's daughter, [name], who was with pt at [facility]. Pt was admitted there 09/02/11 after speaking with [place]. She thinks the problem was pt's "PO2" was too high; pt uses a CPAP at night; he hasn't smoked in over 30 years. Dr Mariah Milling , cardiologist thinks pt wasn't able to process the Propofol. Pt will move to rehab tomorrow to fully recover. Reminded [name] pt will need a repeat EGD in at least 12 weeks to ensure healing of his esophagus. I will inform her when we need to schedule. Informed her I didn't know which sedation Dr Rhea Belton will choose then or if he will do it in the hospital; she stated understanding.

## 2011-09-08 ENCOUNTER — Encounter: Payer: Self-pay | Admitting: Internal Medicine

## 2011-09-08 NOTE — Telephone Encounter (Signed)
Reminder in to schedule an appt post rehab. Our schedule does not go beyond March.

## 2011-09-08 NOTE — Telephone Encounter (Signed)
We'll need to see him in clinic after rehab before any repeat procedure is performed.

## 2011-09-09 ENCOUNTER — Encounter: Payer: Self-pay | Admitting: Internal Medicine

## 2011-09-09 ENCOUNTER — Ambulatory Visit: Payer: Self-pay | Admitting: Internal Medicine

## 2011-09-11 ENCOUNTER — Encounter: Payer: Self-pay | Admitting: Family Medicine

## 2011-09-11 DIAGNOSIS — K209 Esophagitis, unspecified without bleeding: Secondary | ICD-10-CM | POA: Insufficient documentation

## 2011-09-15 LAB — DIGOXIN LEVEL: Digoxin: 1.12 ng/mL

## 2011-09-20 LAB — PROTIME-INR: Prothrombin Time: 17.8 secs — ABNORMAL HIGH (ref 11.5–14.7)

## 2011-09-26 ENCOUNTER — Encounter: Payer: Self-pay | Admitting: *Deleted

## 2011-09-27 ENCOUNTER — Encounter: Payer: Self-pay | Admitting: Cardiovascular Disease

## 2011-09-27 ENCOUNTER — Ambulatory Visit (INDEPENDENT_AMBULATORY_CARE_PROVIDER_SITE_OTHER): Payer: PRIVATE HEALTH INSURANCE | Admitting: Cardiovascular Disease

## 2011-09-27 VITALS — BP 104/67 | HR 76 | Ht 70.0 in | Wt 173.0 lb

## 2011-09-27 DIAGNOSIS — R609 Edema, unspecified: Secondary | ICD-10-CM

## 2011-09-27 DIAGNOSIS — I4891 Unspecified atrial fibrillation: Secondary | ICD-10-CM

## 2011-09-27 DIAGNOSIS — E785 Hyperlipidemia, unspecified: Secondary | ICD-10-CM

## 2011-09-27 DIAGNOSIS — I501 Left ventricular failure: Secondary | ICD-10-CM

## 2011-09-27 DIAGNOSIS — F29 Unspecified psychosis not due to a substance or known physiological condition: Secondary | ICD-10-CM

## 2011-09-27 DIAGNOSIS — I509 Heart failure, unspecified: Secondary | ICD-10-CM

## 2011-09-27 DIAGNOSIS — I2510 Atherosclerotic heart disease of native coronary artery without angina pectoris: Secondary | ICD-10-CM

## 2011-09-27 DIAGNOSIS — I5031 Acute diastolic (congestive) heart failure: Secondary | ICD-10-CM

## 2011-09-27 DIAGNOSIS — R410 Disorientation, unspecified: Secondary | ICD-10-CM

## 2011-09-27 DIAGNOSIS — I10 Essential (primary) hypertension: Secondary | ICD-10-CM

## 2011-09-27 NOTE — Progress Notes (Signed)
Patient ID: Christian [name], male    DOB: 18-May-1941, 71 y.o.   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 He missed wearing his CPAP and had significant  fluid retention.  He has had a difficult year with profound esophagitis, weight loss of more than 50 pounds, anorexia, lack of interest, food getting stuck in his throat, difficulty swallowing dry mouth, periods of hallucinations and memory problems. He was noncompliant with his Lasix as he did not like to Urinate.   He presented to the hospital at [facility] September 02 2011 following an EGD that showed severe esophagitis, moderate gastritis,. He had mental status changes, shortness of breath, elevated cardiac enzymes. Blood gases showed hypercapnia and respiratory depression and he required BiPAP. It was initially felt his respiratory issues were secondary to sedation at the time of his EGD though he required respiratory support for several days.   CT and MRI of the brain showed no acute abnormalities, no PE on CT of the chest EEG showed mild to moderate swelling/diffuse nonspecific encephalopathy. Palpation neurological recommendation was suggested as well as treatment for depression.  Echocardiogram showed normal LV systolic function greater than 55%, borderline elevated right ventricular systolic pressures pulmonic aortic valve stenosis.   Stress test in February 2009 showed ischemia in the apical and inferolateral walls with ejection fraction 66%.   Echocardiogram March 2009 showed ejection fraction 50-55%, mild aortic stenosis, mild pulmonary hypertension with right ventricular systolic  pressure estimated at 32 mm of mercury, aortic valve area of 1.7 cm, mildly dilated left atrium   Cardiac catheterization March 2009 showed 60% left main disease, 25% LAD disease, 50% circumflex disease, 40% OM1 disease, 30% OM 2 disease, 100% RCA disease, patent bypass grafts with a saphenous vein graft to the OM 2, LIMA to the LAD, graft to the PDA   EKG : Atrial fibrillation with ventricular rate 92 beats per minute, nonspecific ST abnormality in V4 through V6, one and aVL   Outpatient Encounter Prescriptions as of 09/27/2011  Medication Sig Dispense Refill  . amiodarone (PACERONE) 200 MG tablet Take 200 mg by mouth 2 (two) times daily.      Marland Kitchen aspirin 81 MG tablet Take 162 mg by mouth daily.       . cetirizine (ZYRTEC) 10 MG tablet Take 10 mg by mouth daily.        . digoxin (LANOXIN) 0.125 MG tablet Take 1 tablet (125 mcg total) by mouth daily.  30 tablet  3  . Fluticasone-Salmeterol (ADVAIR) 250-50 MCG/DOSE AEPB Inhale 1 puff into the lungs every 12 (twelve) hours.      Marland Kitchen glucose blood (ONE TOUCH ULTRA TEST) test strip Use as instructed to check sugar daily  50 each  5  . metoprolol tartrate (LOPRESSOR) 25 MG tablet Take 12.5 mg by mouth 2 (two) times daily.      . nitroGLYCERIN (NITROSTAT) 0.4 MG SL tablet Place 0.4 mg under the tongue every 5 (five) minutes as needed.        Marland Kitchen omeprazole (PRILOSEC) 20 MG capsule Take 2 capsules (40 mg total) by mouth 2 (two) times daily before a meal.  60 capsule  5  .  potassium chloride (KLOR-CON) 20 MEQ packet Take 20 mEq by mouth daily.  30 packet  3  . Rivaroxaban (XARELTO) 20 MG TABS Take by mouth daily.      Marland Kitchen senna (SENOKOT) 8.6 MG TABS Take 1 tablet by mouth.      . Tamsulosin HCl (FLOMAX) 0.4 MG CAPS Take 0.4 mg by mouth daily after supper.      . tiotropium (SPIRIVA) 18 MCG inhalation capsule Place 18 mcg into inhaler and inhale daily.      Marland Kitchen torsemide (DEMADEX) 20 MG tablet Take 1 tablet (20 mg total) by mouth daily.  90 tablet  3   Review  of Systems  Constitutional: Positive for fatigue.  HENT: Negative.   Eyes: Negative.   Respiratory: Positive for shortness of breath.   Gastrointestinal: Negative.   Musculoskeletal: Positive for back pain.  Skin: Negative.   Neurological: Negative.   Hematological: Negative.   Psychiatric/Behavioral: Positive for dysphoric mood.  All other systems reviewed and are negative.    BP 104/67  Pulse 76  Ht 5\' 10"  (1.778 m)  Wt 173 lb (78.472 kg)  BMI 24.82 kg/m2  Physical Exam  Nursing note and vitals reviewed. Constitutional: He is oriented to person, place, and time. He appears well-developed and well-nourished.  HENT:  Head: Normocephalic.  Nose: Nose normal.  Mouth/Throat: Oropharynx is clear and moist.  Eyes: Conjunctivae are normal. Pupils are equal, round, and reactive to light.  Neck: Normal range of motion. Neck supple. No JVD present.  Cardiovascular: Normal rate, S1 normal, S2 normal and intact distal pulses.  An irregularly irregular rhythm present. Exam reveals no gallop and no friction rub.   Murmur heard.  Crescendo systolic murmur is present with a grade of 2/6  Pulmonary/Chest: Effort normal and breath sounds normal. No respiratory distress. He has no wheezes. He has no rales. He exhibits no tenderness.  Abdominal: Soft. Bowel sounds are normal. He exhibits no distension. There is no tenderness.  Musculoskeletal: Normal range of motion. He exhibits no edema and no tenderness.  Lymphadenopathy:    He has no cervical adenopathy.  Neurological: He is alert and oriented to person, place, and time. Coordination normal.  Skin: Skin is warm and dry. No rash noted. No erythema.  Psychiatric: He has a normal mood and affect. His behavior is normal. Judgment and thought content normal.           Assessment and Plan

## 2011-09-27 NOTE — Assessment & Plan Note (Signed)
He is on high-dose proton pump inhibitors

## 2011-09-27 NOTE — Assessment & Plan Note (Signed)
CT, MRI negative. EEG done in the hospital showing mild to moderate swelling. Uncertain if he has followup with Dr. Chestine Spore. Suspect underlying depression.  He appears better today but does have continued weight loss and muscle atrophy concerning for other neurologic issues.

## 2011-09-27 NOTE — Assessment & Plan Note (Signed)
No clinical signs of diastolic heart failure on today's visit. We have suggested he continue on his current medications including his diuretics.

## 2011-09-27 NOTE — Assessment & Plan Note (Signed)
He is interested in cardioversion given his continued mild fatigue and shortness of breath. At his request, we will set this up. My hope is that he will go back to [facility] after the procedure is complete, possibly on March 22.

## 2011-09-27 NOTE — Assessment & Plan Note (Signed)
Edema has improved with medication compliance at [facility] as well as improved nutrition.

## 2011-09-27 NOTE — Assessment & Plan Note (Signed)
Blood pressure is well controlled on today's visit. No changes made to the medications. 

## 2011-09-27 NOTE — Assessment & Plan Note (Signed)
Currently with no symptoms of angina. No further workup at this time. Continue current medication regimen. 

## 2011-09-27 NOTE — Assessment & Plan Note (Signed)
He has been relatively noncompliant with his cholesterol medications. We'll discuss this with him after his cardioversion.

## 2011-09-27 NOTE — Patient Instructions (Addendum)
You are doing well. We will set up a cardioversion at [facility] Continue your current medications  Your physician has recommended that you have a Cardioversion (DCCV). Electrical Cardioversion uses a jolt of electricity to your heart either through paddles or wired patches attached to your chest. This is a controlled, usually prescheduled, procedure. Defibrillation is done under light anesthesia in the hospital, and you usually go home the day of the procedure. This is done to get your heart back into a normal rhythm. You are not awake for the procedure. Please see the instruction sheet given to you today.  You are to arrive at [facility] medical mall entrance on September 30, 2011 @ 6:30 for a Cardioversion. Do not take the Torsemide the am of procedure.   Make sure you have someone come with you & drive you home the day or procedure.  You may take all other medications with water the am of procedure. Do not eat or drink anything after midnight the night before the procedure.   Please call us if you have new issues that need to be addressed before your next appt.  Your physician wants you to follow-up in: 1 months.

## 2011-09-28 ENCOUNTER — Other Ambulatory Visit: Payer: Self-pay

## 2011-09-28 ENCOUNTER — Telehealth: Payer: Self-pay | Admitting: Family Medicine

## 2011-09-28 DIAGNOSIS — I4891 Unspecified atrial fibrillation: Secondary | ICD-10-CM

## 2011-09-28 NOTE — Telephone Encounter (Signed)
Dr. Windell Hummingbird note mentioned EEG and neuro eval prev done. See if patient has f/u scheduled with Neuro.  Thanks.

## 2011-09-28 NOTE — Telephone Encounter (Signed)
LMOVM to return call.

## 2011-09-30 ENCOUNTER — Ambulatory Visit: Payer: Self-pay | Admitting: Cardiovascular Disease

## 2011-09-30 ENCOUNTER — Encounter: Payer: Self-pay | Admitting: *Deleted

## 2011-09-30 DIAGNOSIS — I4891 Unspecified atrial fibrillation: Secondary | ICD-10-CM

## 2011-09-30 NOTE — Telephone Encounter (Signed)
LMOVM to return call and mailed letter. 

## 2011-10-06 ENCOUNTER — Ambulatory Visit: Payer: Self-pay | Admitting: Neurology

## 2011-10-10 ENCOUNTER — Encounter: Payer: Self-pay | Admitting: Internal Medicine

## 2011-10-12 ENCOUNTER — Telehealth: Payer: Self-pay | Admitting: *Deleted

## 2011-10-12 NOTE — Telephone Encounter (Signed)
Message copied by Florene Glen on Wed Oct 12, 2011  3:12 PM ------      Message from: Florene Glen      Created: Mon Sep 26, 2011 12:48 PM       Look for post rehab appt to repeat EGD

## 2011-10-12 NOTE — Telephone Encounter (Signed)
lmom for pts daughter to call back

## 2011-10-13 ENCOUNTER — Telehealth: Payer: Self-pay | Admitting: *Deleted

## 2011-10-13 ENCOUNTER — Ambulatory Visit (INDEPENDENT_AMBULATORY_CARE_PROVIDER_SITE_OTHER): Payer: PRIVATE HEALTH INSURANCE | Admitting: Family Medicine

## 2011-10-13 ENCOUNTER — Encounter: Payer: Self-pay | Admitting: Family Medicine

## 2011-10-13 VITALS — BP 122/70 | HR 96 | Temp 97.5°F | Wt 191.8 lb

## 2011-10-13 DIAGNOSIS — E872 Acidosis: Secondary | ICD-10-CM

## 2011-10-13 DIAGNOSIS — L600 Ingrowing nail: Secondary | ICD-10-CM

## 2011-10-13 DIAGNOSIS — F29 Unspecified psychosis not due to a substance or known physiological condition: Secondary | ICD-10-CM

## 2011-10-13 DIAGNOSIS — G4730 Sleep apnea, unspecified: Secondary | ICD-10-CM

## 2011-10-13 DIAGNOSIS — R410 Disorientation, unspecified: Secondary | ICD-10-CM

## 2011-10-13 DIAGNOSIS — M25559 Pain in unspecified hip: Secondary | ICD-10-CM

## 2011-10-13 MED ORDER — BUDESONIDE-FORMOTEROL FUMARATE 160-4.5 MCG/ACT IN AERO: 2.0000 | INHALATION_SPRAY | Freq: Two times a day (BID) | RESPIRATORY_TRACT | Status: DC

## 2011-10-13 MED ORDER — FLUTICASONE-SALMETEROL 250-50 MCG/DOSE IN AEPB: 1.0000 | INHALATION_SPRAY | Freq: Two times a day (BID) | RESPIRATORY_TRACT | Status: DC

## 2011-10-13 NOTE — Telephone Encounter (Signed)
Addended by: Lars Mage on: 10/13/2011 05:18 PM   Modules accepted: Orders

## 2011-10-13 NOTE — Patient Instructions (Signed)
I'll check with the sleep clinic.  Call the foot clinic about the toenail.  Take up to 2 tylenol 3 times a day for hip pain.

## 2011-10-13 NOTE — Telephone Encounter (Signed)
Pharmacy called saying that the patient's insurance will not cover Advair but will cover Symbicort.  Pharmacy does not need new order sent.  He was able to take my verbal order but we do need to put it into EPIC meds list and I couldn't decide which one to choose.  I removed the Advair from the list.

## 2011-10-13 NOTE — Telephone Encounter (Signed)
Done

## 2011-10-14 ENCOUNTER — Encounter: Payer: Self-pay | Admitting: Family Medicine

## 2011-10-14 DIAGNOSIS — R0689 Other abnormalities of breathing: Secondary | ICD-10-CM | POA: Insufficient documentation

## 2011-10-14 NOTE — Progress Notes (Signed)
89 h/o male with h/o OSA, AF, CAD and CHF with admission for CO2 retention, now much improved with CO2 in 40s per patient report on last testing.  Now more compliant with CPAP and feeling much better.  Energy level is improved, prev jerking movements are much decreased.  We talked about path/phys of CO2 retention and a sig number of his chronic prev sx (ie fatigue) along with the EEG changes could have been due to this.  CPAP use was stressed at the OV.   L hip pain.  Worse if prolonged sitting, better with walking.  Not taking any meds for this.  No trauma.    L1st nail prev ingrown and partially resected, but at risk for becoming ingrown again.  Asking about options.   PMH and SH reviewed  ROS: See HPI, otherwise noncontributory.  Meds, vitals, and allergies reviewed.   Nad ncat Mmm rrr ctab abd soft, not ttp L1st nail thickened but not yet ingrown.  L hip with pain on internal rotation

## 2011-10-17 ENCOUNTER — Telehealth: Payer: Self-pay

## 2011-10-17 ENCOUNTER — Encounter: Payer: Self-pay | Admitting: Family Medicine

## 2011-10-17 ENCOUNTER — Ambulatory Visit (INDEPENDENT_AMBULATORY_CARE_PROVIDER_SITE_OTHER): Payer: PRIVATE HEALTH INSURANCE

## 2011-10-17 VITALS — BP 122/70 | HR 80 | Ht 70.0 in | Wt 190.8 lb

## 2011-10-17 DIAGNOSIS — M25559 Pain in unspecified hip: Secondary | ICD-10-CM | POA: Insufficient documentation

## 2011-10-17 DIAGNOSIS — I2510 Atherosclerotic heart disease of native coronary artery without angina pectoris: Secondary | ICD-10-CM

## 2011-10-17 DIAGNOSIS — I4891 Unspecified atrial fibrillation: Secondary | ICD-10-CM

## 2011-10-17 DIAGNOSIS — L600 Ingrowing nail: Secondary | ICD-10-CM | POA: Insufficient documentation

## 2011-10-17 NOTE — Assessment & Plan Note (Signed)
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  Would avoid sedation meds that could cause AMS.

## 2011-10-17 NOTE — Assessment & Plan Note (Signed)
See below

## 2011-10-17 NOTE — Telephone Encounter (Signed)
LMOM to have Christian [name] call regarding Mr. Trull's EKG that he had today. Per Dr. Mariah Milling the patient needs to decrease the amiodarone back to 200 mg take one tablet daily. The patient is still in a-fib per Dr. Mariah Milling from Mr. Mungo's EKG today.

## 2011-10-17 NOTE — Telephone Encounter (Signed)
Spoke with Christian [name] (daughter) regarding [name] need to decrease the amiodarone to 200 mg take one tablet daily. Told daughter per Dr. Mariah Milling need to check weight to make sure he is not building up fluid. The daughter will call back with the correct way Christian [name] is taking his torsemide.

## 2011-10-17 NOTE — Assessment & Plan Note (Signed)
rec f/u with foot clinic.

## 2011-10-17 NOTE — Assessment & Plan Note (Signed)
Continue CPAP.  D/w pt.   

## 2011-10-17 NOTE — Assessment & Plan Note (Signed)
We talked about path/phys of CO2 retention and a sig number of his chronic prev sx (ie fatigue) along with the EEG changes could have been due to this.  CPAP use was stressed at the OV. He is improved.

## 2011-10-19 ENCOUNTER — Telehealth: Payer: Self-pay | Admitting: Family Medicine

## 2011-10-19 NOTE — Telephone Encounter (Signed)
Notify pt.  I would not refer to neuro at this point as patient is feeling much better.  We may need to refer in the future but I would hold off for now.  Thanks.

## 2011-10-19 NOTE — Telephone Encounter (Signed)
lmom for Jennifer to call back. 

## 2011-10-19 NOTE — Telephone Encounter (Signed)
Patient notified as instructed by telephone. 

## 2011-10-20 ENCOUNTER — Telehealth: Payer: Self-pay

## 2011-10-20 NOTE — Telephone Encounter (Signed)
pts daughter Chase Caller said pt did not understand phone call on 10/19/11. [name] can be reached at [phone].

## 2011-10-20 NOTE — Telephone Encounter (Signed)
Daughter advised.

## 2011-10-27 ENCOUNTER — Other Ambulatory Visit: Payer: Self-pay | Admitting: *Deleted

## 2011-10-27 NOTE — Telephone Encounter (Signed)
Faxed request from [place] [facility] for refill on torsemide.  Request is for 1/2 tablet daily at 8 am, which is different from instructions in chart.  Also, looks like this medicine was discontinued at his last office visit.

## 2011-10-28 MED ORDER — TORSEMIDE 10 MG PO TABS: 10.0000 mg | ORAL_TABLET | Freq: Every day | ORAL | Status: DC

## 2011-10-28 NOTE — Telephone Encounter (Signed)
He should still be on it.  I would continue.  Have him monitor weight, notify us or cards if weight is increased.

## 2011-10-28 NOTE — Telephone Encounter (Signed)
Daughter advised.

## 2011-10-31 ENCOUNTER — Other Ambulatory Visit: Payer: Self-pay | Admitting: *Deleted

## 2011-10-31 MED ORDER — METOPROLOL TARTRATE 25 MG PO TABS: 12.5000 mg | ORAL_TABLET | Freq: Two times a day (BID) | ORAL | Status: DC

## 2011-10-31 MED ORDER — TAMSULOSIN HCL 0.4 MG PO CAPS: 0.4000 mg | ORAL_CAPSULE | Freq: Every day | ORAL | Status: DC

## 2011-10-31 NOTE — Telephone Encounter (Signed)
Is it okay to refill medications?

## 2011-10-31 NOTE — Telephone Encounter (Signed)
Received faxed refill request from pharmacy.  Directions on the request from the pharmacy shows , take 1 by mouth once daily at 8:AM. Please verify directions.

## 2011-10-31 NOTE — Telephone Encounter (Signed)
2 meds sent, but the xarelto needs to come through cards.  Thanks.

## 2011-11-01 ENCOUNTER — Other Ambulatory Visit: Payer: Self-pay | Admitting: *Deleted

## 2011-11-01 ENCOUNTER — Telehealth: Payer: Self-pay

## 2011-11-01 MED ORDER — POTASSIUM CHLORIDE 20 MEQ PO PACK: 20.0000 meq | PACK | Freq: Every day | ORAL | Status: DC

## 2011-11-01 NOTE — Telephone Encounter (Signed)
pts daughter Christian [name] called back wanting to know status of refills.  Nikki at [facility] received the refills for requested meds except Xarelto. Lowella Bandy will send refill request to Dr Tildon Husky cardiology in [place]. Patient's daughter Christian [name] notified as instructed by telephone.

## 2011-11-01 NOTE — Telephone Encounter (Signed)
Spoke with Christian [name] at [facility] no prior authorization is needed for xarelto 20 mg take one tablet daily.

## 2011-11-03 ENCOUNTER — Telehealth: Payer: Self-pay

## 2011-11-03 NOTE — Telephone Encounter (Signed)
Then I would continue as is.  Please adjust the med list to reflect how he is currently taking the med. He has f/u with Dr. Mariah Milling 11/07/11.  Dr. Mariah Milling may change it then, but I wouldn't change in meantime.  I'l notify Dr. Mariah Milling. Thanks.

## 2011-11-03 NOTE — Telephone Encounter (Signed)
[name] (Daughter) advised.

## 2011-11-03 NOTE — Telephone Encounter (Signed)
pts daughter, [name] said pts meds were changed by Dr Dareen Piano when pt was at [facility] in March 2013. Pt is presently taking Torsemide 10 mg taking 1/2 tab daily and Potassium 10 meq taking 1 tab daily. This does not match pts med list and daughter wants to know how Dr Para March wants pt to take med. Please advise. [name] can be reached at [phone]. Pt uses [facility] [employer].

## 2011-11-07 ENCOUNTER — Encounter: Payer: Self-pay | Admitting: Cardiovascular Disease

## 2011-11-07 ENCOUNTER — Ambulatory Visit (INDEPENDENT_AMBULATORY_CARE_PROVIDER_SITE_OTHER): Payer: PRIVATE HEALTH INSURANCE | Admitting: Cardiovascular Disease

## 2011-11-07 VITALS — BP 138/70 | HR 91 | Ht 70.0 in | Wt 194.0 lb

## 2011-11-07 DIAGNOSIS — I5031 Acute diastolic (congestive) heart failure: Secondary | ICD-10-CM

## 2011-11-07 DIAGNOSIS — I509 Heart failure, unspecified: Secondary | ICD-10-CM

## 2011-11-07 DIAGNOSIS — E785 Hyperlipidemia, unspecified: Secondary | ICD-10-CM

## 2011-11-07 DIAGNOSIS — I4891 Unspecified atrial fibrillation: Secondary | ICD-10-CM

## 2011-11-07 DIAGNOSIS — I501 Left ventricular failure: Secondary | ICD-10-CM

## 2011-11-07 DIAGNOSIS — I10 Essential (primary) hypertension: Secondary | ICD-10-CM

## 2011-11-07 DIAGNOSIS — I2510 Atherosclerotic heart disease of native coronary artery without angina pectoris: Secondary | ICD-10-CM

## 2011-11-07 DIAGNOSIS — R609 Edema, unspecified: Secondary | ICD-10-CM

## 2011-11-07 MED ORDER — POTASSIUM CHLORIDE ER 10 MEQ PO TBCR: 10.0000 meq | EXTENDED_RELEASE_TABLET | Freq: Two times a day (BID) | ORAL | Status: DC

## 2011-11-07 MED ORDER — TORSEMIDE 20 MG PO TABS: 20.0000 mg | ORAL_TABLET | Freq: Two times a day (BID) | ORAL | Status: DC

## 2011-11-07 NOTE — Assessment & Plan Note (Signed)
Relatively controlled heart rate on his current medication regimen. No changes have been made. Underlying arrhythmia is likely contributing to his diastolic CHF.

## 2011-11-07 NOTE — Assessment & Plan Note (Signed)
Currently with no symptoms of angina. No further workup at this time. Continue current medication regimen. 

## 2011-11-07 NOTE — Assessment & Plan Note (Signed)
20 pound weight gain over the past month likely multifactorial including arrhythmia/atrial fibrillation, his discharge from rehabilitation now with increased fluid intake, salt intake. His diuretic was decreased and he is only taking torsemide 5 mg daily. Previously he was taking higher doses at rehabilitation.  We will increase his torsemide to 10 mg twice a day. If he does not have any improvement in his weight, we have suggested he increase the dose to 20 mg twice a day. He'll take potassium 10 milliequivalents twice a day with banana. We will check blood work once his weight has decreased to 185 pounds.

## 2011-11-07 NOTE — Assessment & Plan Note (Signed)
He is currently not on a statin. We will discuss this with him on his next visit. I believe that he stopped this by himself.

## 2011-11-07 NOTE — Assessment & Plan Note (Signed)
Blood pressure is well controlled on today's visit. No changes made to the medications. 

## 2011-11-07 NOTE — Patient Instructions (Signed)
Restrict your fluid and salt intake  Take torsemide 10 mg twice a day Take with potassium 10 meq twice a day  Monitor your weight daily Goal weight less than 190, ideal weight 180 If you get to 185 lbs, call the office for blood work  Please call us if you have new issues that need to be addressed before your next appt.  Your physician wants you to follow-up in: 1 months.

## 2011-11-07 NOTE — Assessment & Plan Note (Signed)
Edema is severe, extending to his scrotum with swelling.  He does have mild edema at baseline though symptoms are much worse on today's visit. Diuretic regimen as above.

## 2011-11-07 NOTE — Progress Notes (Signed)
Patient ID: Christian [name], male    DOB: 04/24/1941, 71 y.o.   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   h/o profound esophagitis, weight loss of more than 50 pounds, anorexia, lack of interest, food getting stuck in his throat, difficulty swallowing dry mouth, periods of hallucinations and memory problems.  He presented to the hospital at [facility] September 02 2011 following an EGD that showed severe esophagitis, moderate gastritis,. He had mental status changes, shortness of breath, elevated cardiac enzymes. Blood gases showed hypercapnia and respiratory depression and he required BiPAP. It was initially felt his respiratory issues were secondary to sedation at the time of his EGD though he required respiratory support for several days.   Echocardiogram showed normal LV systolic function greater than 55%, borderline elevated right ventricular systolic pressures pulmonic aortic valve stenosis.  He had cardioversion last month for atrial fibrillation which was successful though in followup he converted back to atrial fibrillation. He denied any significant symptoms in atrial fibrillation. Since his last clinic visit, he has had 20 pound weight gain with worsening lower extremity edema and scrotal swelling. Mild shortness of breath with exertion. He was discharged from [facility] shortly after the cardioversion one month ago at the end of March. He believes the torsemide dose was decreased at the time of discharge.   EKG : Atrial fibrillation with ventricular rate 91 beats per minute, nonspecific ST abnormality in V4 through V6, one and aVL Heart  rate improved into the 70s with rest.   Outpatient Encounter Prescriptions as of 11/07/2011  Medication Sig Dispense Refill  . amiodarone (PACERONE) 200 MG tablet Take 1 tablet (200 mg total) by mouth daily.  30 tablet  3  . aspirin 81 MG tablet Take 81 mg by mouth daily.       . budesonide-formoterol (SYMBICORT) 160-4.5 MCG/ACT inhaler Inhale 2 puffs into the lungs 2 (two) times daily.      . cetirizine (ZYRTEC) 10 MG tablet Take 10 mg by mouth daily.        . digoxin (LANOXIN) 0.125 MG tablet Take 1 tablet (125 mcg total) by mouth daily.  30 tablet  3  . glucose blood (ONE TOUCH ULTRA TEST) test strip Use as instructed to check sugar daily  50 each  5  . metoprolol tartrate (LOPRESSOR) 25 MG tablet Take 0.5 tablets (12.5 mg total) by mouth 2 (two) times daily.  90 tablet  3  . nitroGLYCERIN (NITROSTAT) 0.4 MG SL tablet Place 0.4 mg under the tongue every 5 (five) minutes as needed.        Marland Kitchen omeprazole (PRILOSEC) 20 MG capsule Take 40 mg by mouth daily.      . potassium chloride (KLOR-CON) 20 MEQ packet Take 10 mEq by mouth daily.      . Rivaroxaban (XARELTO) 20 MG TABS Take by mouth daily.      Marland Kitchen senna (SENOKOT) 8.6 MG TABS Take 1 tablet by mouth 2 (two) times daily.       . Tamsulosin HCl (FLOMAX) 0.4 MG CAPS Take 1 capsule (0.4 mg total) by mouth daily after supper.  90 capsule  3  . tiotropium (SPIRIVA) 18 MCG  inhalation capsule Place 18 mcg into inhaler and inhale daily.      Marland Kitchen torsemide (DEMADEX) 20 MG tablet Take 1 tablet (20 mg total) by mouth 2 (two) times daily.  60 tablet  6  . vitamin B-12 (CYANOCOBALAMIN) 1000 MCG tablet Take 1,000 mcg by mouth daily.      Marland Kitchen DISCONTD: potassium chloride (KLOR-CON) 20 MEQ packet Take 20 mEq by mouth daily.  30 packet  3  . DISCONTD: torsemide (DEMADEX) 10 MG tablet Take 5 mg by mouth daily.       Review of Systems  Constitutional: Positive for fatigue.  HENT: Negative.   Eyes: Negative.   Respiratory: Positive for shortness of breath.     Cardiovascular: Positive for leg swelling.  Gastrointestinal: Negative.   Skin: Negative.   Neurological: Negative.   Hematological: Negative.   All other systems reviewed and are negative.    BP 138/70  Pulse 91  Ht 5\' 10"  (1.778 m)  Wt 194 lb (87.998 kg)  BMI 27.84 kg/m2  Physical Exam  Nursing note and vitals reviewed. Constitutional: He is oriented to person, place, and time. He appears well-developed and well-nourished.  HENT:  Head: Normocephalic.  Nose: Nose normal.  Mouth/Throat: Oropharynx is clear and moist.  Eyes: Conjunctivae are normal. Pupils are equal, round, and reactive to light.  Neck: Normal range of motion. Neck supple. No JVD present.  Cardiovascular: Normal rate, S1 normal, S2 normal and intact distal pulses.  An irregularly irregular rhythm present. Exam reveals no gallop and no friction rub.   Murmur heard.  Crescendo systolic murmur is present with a grade of 2/6  Pulmonary/Chest: Effort normal and breath sounds normal. No respiratory distress. He has no wheezes. He has no rales. He exhibits no tenderness.  Abdominal: Soft. Bowel sounds are normal. He exhibits no distension. There is no tenderness.  Musculoskeletal: Normal range of motion. He exhibits no edema and no tenderness.  Lymphadenopathy:    He has no cervical adenopathy.  Neurological: He is alert and oriented to person, place, and time. Coordination normal.  Skin: Skin is warm and dry. No rash noted. No erythema.  Psychiatric: He has a normal mood and affect. His behavior is normal. Judgment and thought content normal.           Assessment and Plan

## 2011-11-08 ENCOUNTER — Other Ambulatory Visit: Payer: Self-pay | Admitting: *Deleted

## 2011-11-08 MED ORDER — TIOTROPIUM BROMIDE MONOHYDRATE 18 MCG IN CAPS: 18.0000 ug | ORAL_CAPSULE | Freq: Every day | RESPIRATORY_TRACT | Status: DC

## 2011-11-08 NOTE — Telephone Encounter (Signed)
I never heard from the pt or his daughter. Wrote pt a letter asking him to call me. Pt is out of the Rehab facility and is being seen by Cardiology and his PCP for sleep apnea.

## 2011-11-09 ENCOUNTER — Encounter: Payer: Self-pay | Admitting: Internal Medicine

## 2011-11-09 NOTE — Patient Instructions (Signed)
EKG done. Still atrial fibrillation

## 2011-11-15 ENCOUNTER — Other Ambulatory Visit: Payer: Self-pay | Admitting: *Deleted

## 2011-11-15 MED ORDER — AMIODARONE HCL 200 MG PO TABS: 200.0000 mg | ORAL_TABLET | Freq: Every day | ORAL | Status: DC

## 2011-11-22 ENCOUNTER — Other Ambulatory Visit: Payer: Self-pay | Admitting: *Deleted

## 2011-11-22 MED ORDER — OMEPRAZOLE 20 MG PO CPDR: 40.0000 mg | DELAYED_RELEASE_CAPSULE | Freq: Every day | ORAL | Status: DC

## 2011-11-23 ENCOUNTER — Encounter: Payer: Self-pay | Admitting: Cardiovascular Disease

## 2011-12-02 ENCOUNTER — Telehealth: Payer: Self-pay | Admitting: *Deleted

## 2011-12-02 NOTE — Telephone Encounter (Signed)
Message copied by Florene Glen on Fri Dec 02, 2011  3:32 PM ------      Message from: Florene Glen      Created: Tue Nov 08, 2011  4:59 PM       See if he called back after letter on 11/07/11

## 2011-12-02 NOTE — Telephone Encounter (Signed)
Never heard from pt or his daughter. Pt was to have a repeat EGD. Please advise. Thanks.

## 2011-12-06 NOTE — Telephone Encounter (Signed)
Would say, office visit if patient able.  I would like to see him 1st before repeating the EGD (procedure may not be necessary).

## 2011-12-07 NOTE — Telephone Encounter (Signed)
lmom for daughter, [name] to call back.

## 2011-12-12 ENCOUNTER — Ambulatory Visit (INDEPENDENT_AMBULATORY_CARE_PROVIDER_SITE_OTHER): Payer: PRIVATE HEALTH INSURANCE | Admitting: Cardiovascular Disease

## 2011-12-12 ENCOUNTER — Encounter: Payer: Self-pay | Admitting: Cardiovascular Disease

## 2011-12-12 VITALS — BP 96/64 | HR 83 | Ht 70.0 in | Wt 175.0 lb

## 2011-12-12 DIAGNOSIS — R609 Edema, unspecified: Secondary | ICD-10-CM

## 2011-12-12 DIAGNOSIS — I5031 Acute diastolic (congestive) heart failure: Secondary | ICD-10-CM

## 2011-12-12 DIAGNOSIS — R0602 Shortness of breath: Secondary | ICD-10-CM

## 2011-12-12 DIAGNOSIS — I4891 Unspecified atrial fibrillation: Secondary | ICD-10-CM

## 2011-12-12 DIAGNOSIS — E785 Hyperlipidemia, unspecified: Secondary | ICD-10-CM

## 2011-12-12 DIAGNOSIS — I10 Essential (primary) hypertension: Secondary | ICD-10-CM

## 2011-12-12 DIAGNOSIS — I509 Heart failure, unspecified: Secondary | ICD-10-CM

## 2011-12-12 DIAGNOSIS — I2510 Atherosclerotic heart disease of native coronary artery without angina pectoris: Secondary | ICD-10-CM

## 2011-12-12 NOTE — Assessment & Plan Note (Signed)
Rate is relatively well controlled. Continue anticoagulation.

## 2011-12-12 NOTE — Assessment & Plan Note (Signed)
Edema on the left likely secondary to old DVT and venous insufficiency.

## 2011-12-12 NOTE — Assessment & Plan Note (Signed)
Currently with no symptoms of angina. No further workup at this time. Continue current medication regimen. 

## 2011-12-12 NOTE — Progress Notes (Signed)
Patient ID: Christian [name], male    DOB: 24-Jun-1941, 71 y.o.   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   h/o profound esophagitis, weight loss of more than 50 pounds, anorexia, lack of interest, food getting stuck in his throat, difficulty swallowing dry mouth, periods of hallucinations and memory problems.  He presented to the hospital at [facility] September 02 2011 following an EGD that showed severe esophagitis, moderate gastritis,. He had mental status changes, shortness of breath, elevated cardiac enzymes. Blood gases showed hypercapnia and respiratory depression and he required BiPAP. It was initially felt his respiratory issues were secondary to sedation at the time of his EGD though he required respiratory support for several days.   Echocardiogram showed normal LV systolic function greater than 55%, borderline elevated right ventricular systolic pressures pulmonic aortic valve stenosis.  He had cardioversion  for atrial fibrillation which was successful though in followup he converted back to atrial fibrillation. On his last clinic visit, his weight was up 20 pounds on torsemide 5 mg daily. He was increased slowly to 20 mg twice a day and now has had a 20 pound weight loss. He has mild edema on the left which is chronic, no significant shortness of breath. Weight is at his baseline 175 pounds. He was actually supposed to call the clinic at 185 pounds for lab work which was never done. He has no complaints.   EKG : Atrial fibrillation with ventricular rate 83 beats per minute, nonspecific ST abnormality in V4 through V6, one and  aVL   Outpatient Encounter Prescriptions as of 11/07/2011  Medication Sig Dispense Refill  . amiodarone (PACERONE) 200 MG tablet Take 1 tablet (200 mg total) by mouth daily.  30 tablet  3  . aspirin 81 MG tablet Take 81 mg by mouth daily.       . budesonide-formoterol (SYMBICORT) 160-4.5 MCG/ACT inhaler Inhale 2 puffs into the lungs 2 (two) times daily.      . cetirizine (ZYRTEC) 10 MG tablet Take 10 mg by mouth daily.        . digoxin (LANOXIN) 0.125 MG tablet Take 1 tablet (125 mcg total) by mouth daily.  30 tablet  3  . glucose blood (ONE TOUCH ULTRA TEST) test strip Use as instructed to check sugar daily  50 each  5  . metoprolol tartrate (LOPRESSOR) 25 MG tablet Take 0.5 tablets (12.5 mg total) by mouth 2 (two) times daily.  90 tablet  3  . nitroGLYCERIN (NITROSTAT) 0.4 MG SL tablet Place 0.4 mg under the tongue every 5 (five) minutes as needed.        Marland Kitchen omeprazole (PRILOSEC) 20 MG capsule Take 40 mg by mouth daily.      . potassium chloride (KLOR-CON) 20 MEQ packet Take 10 mEq by mouth daily.      . Rivaroxaban (XARELTO) 20 MG TABS Take by mouth daily.      Marland Kitchen senna (SENOKOT) 8.6 MG TABS Take 1 tablet by mouth 2 (two) times daily.       . Tamsulosin HCl (FLOMAX) 0.4 MG CAPS Take 1 capsule (0.4 mg total) by mouth daily after supper.  90 capsule  3  . tiotropium (SPIRIVA) 18 MCG inhalation capsule Place 18 mcg into inhaler and inhale daily.      Marland Kitchen torsemide (DEMADEX) 20 MG tablet Take 1 tablet (20 mg total) by mouth 2 (two) times daily.  60 tablet  6  . vitamin B-12 (CYANOCOBALAMIN) 1000 MCG tablet Take 1,000 mcg by mouth daily.       Review of Systems  HENT: Negative.   Eyes: Negative.   Cardiovascular: Positive for leg swelling.       Leg swelling on left  Gastrointestinal: Negative.   Skin: Negative.   Neurological: Negative.   Hematological: Negative.   All other systems reviewed and are negative.    BP 96/64  Pulse 83  Ht 5\' 10"  (1.778 m)  Wt 175 lb (79.379 kg)  BMI  25.11 kg/m2  Physical Exam  Nursing note and vitals reviewed. Constitutional: He is oriented to person, place, and time. He appears well-developed and well-nourished.  HENT:  Head: Normocephalic.  Nose: Nose normal.  Mouth/Throat: Oropharynx is clear and moist.  Eyes: Conjunctivae are normal. Pupils are equal, round, and reactive to light.  Neck: Normal range of motion. Neck supple. No JVD present.  Cardiovascular: Normal rate, S1 normal, S2 normal and intact distal pulses.  An irregularly irregular rhythm present. Exam reveals no gallop and no friction rub.   Murmur heard.  Crescendo systolic murmur is present with a grade of 2/6  Pulmonary/Chest: Effort normal and breath sounds normal. No respiratory distress. He has no wheezes. He has no rales. He exhibits no tenderness.  Abdominal: Soft. Bowel sounds are normal. He exhibits no distension. There is no tenderness.  Musculoskeletal: Normal range of motion. He exhibits no edema and no tenderness.  Lymphadenopathy:    He has no cervical adenopathy.  Neurological: He is alert and oriented to person, place, and time. Coordination normal.  Skin: Skin is warm and dry. No rash noted. No erythema.  Psychiatric: He has a normal mood and affect. His behavior is normal. Judgment and thought content normal.           Assessment and Plan

## 2011-12-12 NOTE — Assessment & Plan Note (Signed)
Blood pressure is low today. Likely secondary to overdiuresis. We will decrease the dose of his torsemide, check lab work and decreased torsemide more if needed.

## 2011-12-12 NOTE — Assessment & Plan Note (Signed)
Continue aggressive lipid management. 

## 2011-12-12 NOTE — Assessment & Plan Note (Signed)
Significant improvement. Basic metabolic panel today. If this shows signs of volume contraction, we will significantly cut back on his torsemide to once a day at lower dose. We'll maintain weight at 175 pounds depending on basic metabolic panel results.

## 2011-12-12 NOTE — Patient Instructions (Addendum)
You are doing well. Please hold the torsemide in the afternoon every other day (also hold potassium when not taking torsemide) Hold PM torsemide for weight less than 175 We will check kidney function today  Please call us if you have new issues that need to be addressed before your next appt.  Your physician wants you to follow-up in: 6 months.  You will receive a reminder letter in the mail two months in advance. If you don't receive a letter, please call our office to schedule the follow-up appointment.

## 2011-12-13 LAB — BASIC METABOLIC PANEL
BUN/Creatinine Ratio: 13 (ref 10–22)
BUN: 17 mg/dL (ref 8–27)
Chloride: 88 mmol/L — ABNORMAL LOW (ref 97–108)
GFR calc Af Amer: 63 mL/min/{1.73_m2} (ref >59)
GFR calc non Af Amer: 54 mL/min/{1.73_m2} — ABNORMAL LOW (ref >59)

## 2011-12-26 ENCOUNTER — Ambulatory Visit (INDEPENDENT_AMBULATORY_CARE_PROVIDER_SITE_OTHER): Payer: PRIVATE HEALTH INSURANCE | Admitting: Family Medicine

## 2011-12-26 ENCOUNTER — Encounter: Payer: Self-pay | Admitting: Family Medicine

## 2011-12-26 VITALS — BP 108/70 | HR 80 | Temp 97.6°F | Wt 179.8 lb

## 2011-12-26 DIAGNOSIS — E119 Type 2 diabetes mellitus without complications: Secondary | ICD-10-CM

## 2011-12-26 DIAGNOSIS — I509 Heart failure, unspecified: Secondary | ICD-10-CM

## 2011-12-26 DIAGNOSIS — I5031 Acute diastolic (congestive) heart failure: Secondary | ICD-10-CM

## 2011-12-26 MED ORDER — TORSEMIDE 20 MG PO TABS: ORAL_TABLET | ORAL | Status: DC

## 2011-12-26 NOTE — Patient Instructions (Addendum)
Torsemide dose change.  Alternate 10mg  one day then 20mg  the next. If weight>180 lbs, then take 20mg  a day until weight is down and then resume alternate dosing.  Call with an update if you have concerns.  Make sure to eat a snack before exercise.

## 2011-12-26 NOTE — Progress Notes (Signed)
Exercising 3x/week at [facility].  He had some episodes of low sugar after exercise.  Has been using cane at all times due to concern for balance.  BP has been low at mult MD visits.  He can get lightheaded while walking.  Compliant with meds.    Meds, vitals, and allergies reviewed.   ROS: See HPI.  Otherwise, noncontributory.  nad Flat affect but speech at baseline IRR with murmur noted No wheeze, no focal dec in BS abd soft 1+ BLE edema Recheck pulse ox 94%

## 2011-12-27 NOTE — Assessment & Plan Note (Signed)
On no meds, with occ post exercise low sugar.  Advised to take a snack before exercise.  He understood.

## 2011-12-27 NOTE — Assessment & Plan Note (Signed)
With low BP and lightheadedness.  Weight has been ~174 at home on 20mg  torsemide a day.  Will inc target weight to 180.  Alternate 10mg /20mg  per day with torsemide dose.  If weight >180, then inc to 20mg  a day until weight reduced.  He agrees.  Will notify me if sx continue.

## 2011-12-29 ENCOUNTER — Ambulatory Visit: Payer: PRIVATE HEALTH INSURANCE | Admitting: Family Medicine

## 2012-01-04 ENCOUNTER — Encounter: Payer: Self-pay | Admitting: Internal Medicine

## 2012-01-17 ENCOUNTER — Ambulatory Visit (INDEPENDENT_AMBULATORY_CARE_PROVIDER_SITE_OTHER): Payer: PRIVATE HEALTH INSURANCE | Admitting: Pulmonary Disease

## 2012-01-17 ENCOUNTER — Encounter: Payer: Self-pay | Admitting: Pulmonary Disease

## 2012-01-17 VITALS — BP 124/78 | HR 99 | Temp 97.7°F | Ht 71.0 in | Wt 182.1 lb

## 2012-01-17 DIAGNOSIS — R0609 Other forms of dyspnea: Secondary | ICD-10-CM

## 2012-01-17 DIAGNOSIS — G4730 Sleep apnea, unspecified: Secondary | ICD-10-CM

## 2012-01-17 DIAGNOSIS — J9610 Chronic respiratory failure, unspecified whether with hypoxia or hypercapnia: Secondary | ICD-10-CM

## 2012-01-17 DIAGNOSIS — J984 Other disorders of lung: Secondary | ICD-10-CM

## 2012-01-17 DIAGNOSIS — I5031 Acute diastolic (congestive) heart failure: Secondary | ICD-10-CM

## 2012-01-17 DIAGNOSIS — I509 Heart failure, unspecified: Secondary | ICD-10-CM

## 2012-01-17 DIAGNOSIS — R29898 Other symptoms and signs involving the musculoskeletal system: Secondary | ICD-10-CM

## 2012-01-17 NOTE — Progress Notes (Signed)
Subjective:    Patient ID: Christian [name], male    DOB: 1941-03-29, 71 y.o.   MRN: [id number]  HPI  01/17/12 initial office visit--Mr. Dorn is a very pleasant male with atrial fibrillation obstructive sleep apnea and diastolic heart failure who comes to our clinic today for evaluation of chronic respiratory failure. He states that he smokes cigarettes one to 2 packs a day for 24 years and quit at age 76. He also worked in a [employer] they got serious about the lung problems". He says that he has had some shortness of breath since he was in his 80s but this did not significantly limit his ability to get around and take care of himself until 4 years ago. He states that at that point he was climbing 2 flights of stairs to go visit his sister he felt significantly winded to new there is a problem. He states that since then he has shortness of breath which is worse at night he often has to sit up in the middle the night because of shortness of breath dry mouth and neck pain. He is not coughing he does not wheeze. He was hospitalized at [facility] in February of 2013 for hypercapnic respiratory failure which was believed to be due to oversedation from endoscopy and chronic respiratory failure related to possibly COPD and obstructive sleep apnea. Since discharge he has participated in both inpatient and outpatient rehabilitation and has used his CPAP machine religiously. His weight has been up and down but now that he is on a better diuretic regimen his weight has been stable between 175 and 182 pounds.  He states that he has noticed increasing leg weakness over the last several months and has difficulty standing. He has taken Spriva since hospital discharge however he says this does not help.  Past Medical History  Diagnosis Date  . Coronary artery disease   . Diabetes mellitus   . Hyperlipidemia   . Hypertension   . GERD (gastroesophageal reflux disease)   . Arthritis   . Diastolic CHF, chronic     . Kidney stones   . OSA (obstructive sleep apnea)   . Atrial fibrillation     DCCV 04/2010  . CO2 retention     during admission to [facility] 2013     Family History  Problem Relation Age of Onset  . Heart disease Brother   . Arthritis Mother   . Hypertension Mother   . Heart disease Father   . Hyperlipidemia Father   . Stroke Father   . Diabetes Father   . Heart disease Sister   . Aneurysm Mother   . Heart failure Father      History   Social History  . Marital Status: Divorced    Spouse Name: N/A    Number of Children: 2  . Years of Education: N/A   Occupational History  . Retired     [employer]   Social History Main Topics  . Smoking status: Former Games developer  . Smokeless tobacco: Never Used   Comment: started age 27 quit age 45  . Alcohol Use: No  . Drug Use: No  . Sexually Active: Not on file   Other Topics Concern  . Not on file   Social History Narrative   Divorced [address] son, 1 daughter, both localRetired from [employer]     Allergies  Allergen Reactions  . Crestor (Rosuvastatin Calcium)     myalgias  . Pradaxa (Dabigatran Etexilate Mesylate)  Inflammation in throat.  . Tramadol     Intolerant but not an allergy; sedation     Outpatient Prescriptions Prior to Visit  Medication Sig Dispense Refill  . amiodarone (PACERONE) 200 MG tablet Take 1 tablet (200 mg total) by mouth daily.  30 tablet  3  . aspirin 81 MG tablet Take 81 mg by mouth daily.       . cetirizine (ZYRTEC) 10 MG tablet Take 10 mg by mouth daily.        . digoxin (LANOXIN) 0.125 MG tablet Take 1 tablet (125 mcg total) by mouth daily.  30 tablet  3  . glucose blood (ONE TOUCH ULTRA TEST) test strip Use as instructed to check sugar daily  50 each  5  . metoprolol tartrate (LOPRESSOR) 25 MG tablet Take 0.5 tablets (12.5 mg total) by mouth 2 (two) times daily.  90 tablet  3  . nitroGLYCERIN (NITROSTAT) 0.4 MG SL tablet Place 0.4 mg under the tongue every 5 (five) minutes as needed.         Marland Kitchen omeprazole (PRILOSEC) 20 MG capsule Take 20 mg by mouth 2 (two) times daily.      . potassium chloride (K-DUR) 10 MEQ tablet Take 1 tablet (10 mEq total) by mouth 2 (two) times daily.  60 tablet  6  . Rivaroxaban (XARELTO) 20 MG TABS Take by mouth daily.      Marland Kitchen senna (SENOKOT) 8.6 MG TABS Take 1 tablet by mouth 2 (two) times daily.       . Tamsulosin HCl (FLOMAX) 0.4 MG CAPS Take 1 capsule (0.4 mg total) by mouth daily after supper.  90 capsule  3  . tiotropium (SPIRIVA) 18 MCG inhalation capsule Place 1 capsule (18 mcg total) into inhaler and inhale daily.  30 capsule  3  . torsemide (DEMADEX) 20 MG tablet Alternate 10mg  one day then 20mg  the next. If weight>180 lbs, then take 20mg  a day until weight is down and then resume alternate dosing.      . vitamin B-12 (CYANOCOBALAMIN) 1000 MCG tablet Take 1,000 mcg by mouth daily.          Review of Systems  Constitutional: Positive for activity change and appetite change. Negative for fever and chills.  HENT: Positive for ear pain, rhinorrhea, neck pain, neck stiffness and postnasal drip. Negative for hearing loss, congestion, sneezing and sinus pressure.   Eyes: Negative for redness, itching and visual disturbance.  Respiratory: Positive for shortness of breath and wheezing. Negative for cough and chest tightness.   Cardiovascular: Negative for chest pain, palpitations and leg swelling.  Gastrointestinal: Positive for constipation. Negative for nausea, vomiting, abdominal pain, diarrhea, blood in stool and abdominal distention.  Musculoskeletal: Positive for arthralgias. Negative for myalgias, joint swelling and gait problem.  Skin: Negative for rash.  Neurological: Positive for light-headedness and headaches. Negative for dizziness and numbness.  Hematological: Bruises/bleeds easily.  Psychiatric/Behavioral: Positive for dysphoric mood. Negative for confusion.       Objective:   Physical Exam Filed Vitals:   01/17/12 1540  BP: 124/78   Pulse: 99  Temp: 97.7 F (36.5 C)  TempSrc: Oral  Height: 5\' 11"  (1.803 m)  Weight: 182 lb 1.9 oz (82.609 kg)  SpO2: 91%  He walked 300 feet in the office and his O2 saturation dropped to 90% and had to stop due to leg weakness  Gen: chronically ill appearing HEENT: NCAT, PERRL, EOMi, OP clear, neck supple without masses PULM: CTA B, poor air  movement CV: Irreg irreg, systolic murmurs noted, no JVD AB: BS+, soft, nontender, no hsm Ext: warm, significant pitting edema, no clubbing, no cyanosis Derm: no rash or skin breakdown Neuro: A&Ox4, CN II-XII intact, grip strength is 4/5 bilaterally, bicep strength 4+/5, shoulder abduction is 4+/5, hip flexion is 3/5 bilaterally, knee extension 4+/5  01/16/12 Simple spirometry: FEV1 to FVC ratio 93% FVC 1.43 L (31% predicted)     Assessment & Plan:   Chronic respiratory failure Mr. Klinker has chronic respiratory failure I believe mostly due to restrictive process on simple spirometry performed today in our clinic he had absolutely no obstruction. Further on my review of his images from his [facility] hospitalization in February of 2013 there is no evidence of emphysema seen on his CT scan. 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 There is also some volume overload noted at that time. The differential diagnosis of his restrictive lung disease includes kyphosis, pleural disease, neuromuscular weakness versus less likely interstitial lung disease. Chronic congestive heart failure can also lead to restrictive lung disease but I think that this is less likely as these patients are typically quite hypoxemic. I believe that the changes that were seen on the CT scan in February 2013 were due to acute volume overload and not due to interstitial scarring process. Today on exam he has no crackles and his weight is down significantly from what was reported during the hospitalization. He does not have COPD as there is no emphysema on his  CT scan and no obstruction by spirometry. Further he has no history of cough or flares of COPD or wheezing. The Spiriva does not help which is not surprising. Mr. Mishkin is chronic hypercapnia is likely due to this restrictive process I am concerned that he has neuromuscular weakness causing progressive respiratory failure. He is quite weak on exam today. I believe that he needs BiPAP rather than CPAP for his chronic hypercapnic respiratory failure.  Plan: -Refer to neurology for evaluation of neuromuscular weakness -Since you [facility] for full PFTs -Obtain a blood gas at [facility] -[facility] -Refer to pulmonary rehabilitation for chronic respiratory failure -Stop Spiriva  Leg weakness I am concerned about neuromuscular weakness versus spinal stenosis. He has been seen by Dr. Chestine Spore with neurology and I will refer him back to see him again.  Diastolic CHF, acute He has some leg swelling but overall he appears euvolemic on my exam today. Continue diuretics with a goal weight of 175 per Dr. Mariah Milling.  Sleep apnea Continue CPAP use for now. However I think he needs BiPAP based on his baseline chronic hypercapnia. I will [facility] for a [facility] titration study.    Updated Medication List Outpatient Encounter Prescriptions as of 01/17/2012  Medication Sig Dispense Refill  . acetaminophen (TYLENOL) 500 MG tablet Take 500 mg by mouth every 6 (six) hours as needed.      Marland Kitchen amiodarone (PACERONE) 200 MG tablet Take 1 tablet (200 mg total) by mouth daily.  30 tablet  3  . aspirin 81 MG tablet Take 81 mg by mouth daily.       . cetirizine (ZYRTEC) 10 MG tablet Take 10 mg by mouth daily.        . digoxin (LANOXIN) 0.125 MG tablet Take 1 tablet (125 mcg total) by mouth daily.  30 tablet  3  . glucose blood (ONE  TOUCH ULTRA TEST) test strip Use as instructed to check sugar daily  50 each  5  . metoprolol tartrate (LOPRESSOR) 25 MG  tablet Take 0.5 tablets (12.5 mg total) by mouth 2 (two) times daily.  90 tablet  3  . nitroGLYCERIN (NITROSTAT) 0.4 MG SL tablet Place 0.4 mg under the tongue every 5 (five) minutes as needed.        Marland Kitchen omeprazole (PRILOSEC) 20 MG capsule Take 20 mg by mouth 2 (two) times daily.      . potassium chloride (K-DUR) 10 MEQ tablet Take 1 tablet (10 mEq total) by mouth 2 (two) times daily.  60 tablet  6  . Rivaroxaban (XARELTO) 20 MG TABS Take by mouth daily.      Marland Kitchen senna (SENOKOT) 8.6 MG TABS Take 1 tablet by mouth 2 (two) times daily.       . Tamsulosin HCl (FLOMAX) 0.4 MG CAPS Take 1 capsule (0.4 mg total) by mouth daily after supper.  90 capsule  3  . tiotropium (SPIRIVA) 18 MCG inhalation capsule Place 1 capsule (18 mcg total) into inhaler and inhale daily.  30 capsule  3  . torsemide (DEMADEX) 20 MG tablet Alternate 10mg  one day then 20mg  the next. If weight>180 lbs, then take 20mg  a day until weight is down and then resume alternate dosing.      . vitamin B-12 (CYANOCOBALAMIN) 1000 MCG tablet Take 1,000 mcg by mouth daily.

## 2012-01-17 NOTE — Assessment & Plan Note (Signed)
Christian [name] has chronic respiratory failure I believe mostly due to restrictive process on simple spirometry performed today in our clinic he had absolutely no obstruction. Further on my review of his images from his [facility] hospitalization in February of 2013 there is no evidence of emphysema seen on his CT scan. 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 There is also some volume overload noted at that time. The differential diagnosis of his restrictive lung disease includes kyphosis, pleural disease, neuromuscular weakness versus less likely interstitial lung disease. Chronic congestive heart failure can also lead to restrictive lung disease but I think that this is less likely as these patients are typically quite hypoxemic. I believe that the changes that were seen on the CT scan in February 2013 were due to acute volume overload and not due to interstitial scarring process. Today on exam he has no crackles and his weight is down significantly from what was reported during the hospitalization. He does not have COPD as there is no emphysema on his CT scan and no obstruction by spirometry. Further he has no history of cough or flares of COPD or wheezing. The Spiriva does not help which is not surprising. Christian [name] is chronic hypercapnia is likely due to this restrictive process I am concerned that he has neuromuscular weakness causing progressive respiratory failure. He is quite weak on exam today. I believe that he needs BiPAP rather than CPAP for his chronic hypercapnic respiratory failure.  Plan: -Refer to neurology for evaluation of neuromuscular weakness -Since you [facility] for full PFTs -Obtain a blood gas at [facility] -[facility] -Refer to pulmonary rehabilitation for chronic respiratory failure -Stop Spiriva

## 2012-01-17 NOTE — Patient Instructions (Addendum)
Stop Spiriva  We will obtain records from Dr. Clovis Fredrickson office from your hospitalization at [facility]. We will refer you to a neurologist for evaluation of your leg weakness. Keep taking your medications from Dr. Windell Hummingbird office as prescribed  We will send you for full PFT's at [facility]. We will send you for an ABG at [facility]. We will refer you to pulmonary rehab at [facility].  We will see you back in 4-6 weeks, overbook OK

## 2012-01-17 NOTE — Assessment & Plan Note (Signed)
I am concerned about neuromuscular weakness versus spinal stenosis. He has been seen by Dr. Chestine Spore with neurology and I will refer him back to see him again.

## 2012-01-17 NOTE — Assessment & Plan Note (Signed)
Continue CPAP use for now. However I think he needs BiPAP based on his baseline chronic hypercapnia. I will [facility] a [facility] titration study.

## 2012-01-17 NOTE — Assessment & Plan Note (Signed)
He has some leg swelling but overall he appears euvolemic on my exam today. Continue diuretics with a goal weight of 175 per Dr. Mariah Milling.

## 2012-01-24 ENCOUNTER — Other Ambulatory Visit (HOSPITAL_COMMUNITY): Payer: Self-pay | Admitting: Radiology

## 2012-01-24 ENCOUNTER — Ambulatory Visit (HOSPITAL_COMMUNITY)
Admission: RE | Admit: 2012-01-24 | Discharge: 2012-01-24 | Disposition: A | Discharge status: Home / Self Care | Payer: PRIVATE HEALTH INSURANCE | Source: Ambulatory Visit | Attending: Pulmonary Disease | Admitting: Pulmonary Disease

## 2012-01-24 DIAGNOSIS — R0602 Shortness of breath: Secondary | ICD-10-CM | POA: Insufficient documentation

## 2012-01-24 DIAGNOSIS — J9610 Chronic respiratory failure, unspecified whether with hypoxia or hypercapnia: Secondary | ICD-10-CM

## 2012-01-24 DIAGNOSIS — J984 Other disorders of lung: Secondary | ICD-10-CM | POA: Insufficient documentation

## 2012-01-24 LAB — BLOOD GAS, ARTERIAL
Acid-Base Excess: 11.7 mmol/L — ABNORMAL HIGH (ref 0.0–2.0)
Drawn by: 242311
O2 Saturation: 97.1 %
Patient temperature: 98.6
TCO2: 38 mmol/L (ref 0–100)
pCO2 arterial: 53.7 mmHg — ABNORMAL HIGH (ref 35.0–45.0)

## 2012-01-24 LAB — PULMONARY FUNCTION TEST

## 2012-01-24 MED ORDER — ALBUTEROL SULFATE (5 MG/ML) 0.5% IN NEBU
2.5000 mg | INHALATION_SOLUTION | Freq: Once | RESPIRATORY_TRACT | Status: AC
  Administered 2012-01-24: 2.5 mg via RESPIRATORY_TRACT

## 2012-01-24 NOTE — Telephone Encounter (Addendum)
Pt nor daughter ever called back after frequent telephone calls and a letter to pt. Daughter, Jennifer's VM identifies her, so I have the correct number.

## 2012-01-24 NOTE — Telephone Encounter (Signed)
We've attempted to contact pt for follow-up multiple times, will await to hear from patient or daughter.

## 2012-01-27 ENCOUNTER — Encounter: Payer: Self-pay | Admitting: Family Medicine

## 2012-01-27 ENCOUNTER — Telehealth: Payer: Self-pay | Admitting: Cardiovascular Disease

## 2012-01-27 ENCOUNTER — Ambulatory Visit (INDEPENDENT_AMBULATORY_CARE_PROVIDER_SITE_OTHER): Payer: PRIVATE HEALTH INSURANCE | Admitting: Family Medicine

## 2012-01-27 VITALS — BP 118/72 | HR 73 | Temp 97.4°F | Wt 179.0 lb

## 2012-01-27 DIAGNOSIS — M542 Cervicalgia: Secondary | ICD-10-CM

## 2012-01-27 NOTE — Telephone Encounter (Signed)
Pt daughter calling concerning pt medications

## 2012-01-27 NOTE — Telephone Encounter (Signed)
Would call us back with blood pressure numbers If blood pressure is running low, this could explain periodic dizziness He has had extreme weight loss recently which could explain low blood pressure If it runs low, may need to add medication such as midodrine 3 times a day

## 2012-01-27 NOTE — Patient Instructions (Addendum)
Take tylenol for the pain for now and I'll await the MR.  Take care.

## 2012-01-27 NOTE — Progress Notes (Signed)
Neck and shoulder pain.  Going on intermittently for years, worse recently.  Some better with tylenol.  Worse with certain movements.  L sided pain.  Occ radiation, radicular pain per patient.  No focal weakness.  "If I make the wrong move, I know it."  Asking about options.  Has back MRI pending.    Meds, vitals, and allergies reviewed.   ROS: See HPI.  Otherwise, noncontributory.  nad ncat No meningismus but he is careful with ROM at the neck to prevent discomfort.  No midline neck pain.  L>R paraspinal muscles are tender.   Pain is reproduceable with palpation and ROM Normal ROM at the L shoulder w/o weakness distally.

## 2012-01-27 NOTE — Telephone Encounter (Signed)
FYI  Pt's dtr says pt has been c/o dizziness.  She says it is not associated with position change but just randomly occurs.  She wonders if any of his meds could be causing this.    Dr. Para March changed weight goal from 175 pounds to 185 pounds since he felt that was "too dry". Pt is staying in the middle (180 pounds) and feels better at this weight than 175 pounds.  He takes torsemide 10 mg alt with 20 mg QOD.   He does not have a home BP monitor so dtr is unsure what BP has been.  Each time he goes to MD, it runs between 80/40-100/50.  He continues to take metoprolol tartrate 12.5 mg BID with digoxin and amiodarone.  Since we are not sure what BP is running, it may be too low.  Dtr says she will buy a BP cuff today.  He has appt with Dr. Para March this pm as well. I advised dtr to have pt hold metoprolol to see if this helps his symptoms.  It seems the torsemide dose is doing well with regards to his weight and other CHF symptoms.  I advised her to let Dr. Para March know of this change as well.  She and pt will monitor BP and symptoms over w/e and will call us Monday to let us know how he feels off metoprolol.  Understanding verb.

## 2012-01-30 ENCOUNTER — Encounter: Payer: Self-pay | Admitting: Family Medicine

## 2012-01-30 DIAGNOSIS — M542 Cervicalgia: Secondary | ICD-10-CM | POA: Insufficient documentation

## 2012-01-30 NOTE — Assessment & Plan Note (Signed)
Appears radicular but he would like to hold off on treatment (ie neurontin) and continue with tylenol as it is manageable for now.  MRI is pending and he'll let me know about that when done.  Okay for outpatient fu.

## 2012-02-01 ENCOUNTER — Telehealth: Payer: Self-pay | Admitting: Pulmonary Disease

## 2012-02-01 DIAGNOSIS — J9610 Chronic respiratory failure, unspecified whether with hypoxia or hypercapnia: Secondary | ICD-10-CM

## 2012-02-01 NOTE — Telephone Encounter (Signed)
We just need to switch him to BIPAP without dealing with [facility] (I just learned how to do this today after talking to Dr. Delford Field).  Let his DME company know that a February 2013 ABG was 7.47/74/70 at rest during the day.  I would like the company to start him on BIPAP pressures 12/6 with a device that can monitor him at home (compliance, smart card) and generate a report for [place] to see how he is doing.

## 2012-02-01 NOTE — Telephone Encounter (Signed)
Spoke with [employer]. She is asking for results of ABG and also PFT's. I advised that BQ not in the office again until 02-13-12 and can see the ABG in the system, but the PFT will need to be reviewed once back in the office. She is ok with waiting for the PFT results, but would like the ABG results before then. I see where you signed them, but not sure what to advise her. Please advise,thanks!  FYI- the pt is not going back to [facility] until Dec 2013 per [name]

## 2012-02-02 NOTE — Telephone Encounter (Signed)
LMTCBx1 to advise [facility]. Order has been placed for below. Carron Curie, CMA

## 2012-02-02 NOTE — Telephone Encounter (Signed)
Pt's daughter aware of Dr. Ulyses Jarred plan and has already spoke with our [facility], Libby. She will call if she has any further questions or concerns.

## 2012-02-02 NOTE — Telephone Encounter (Signed)
Pt's daughter, Lazarus Salines to speak w/ BQ's nurse & get some guidance on where to go from here.  Stated she is just confused & unsure where everything stands at this time.  Christian [name]

## 2012-02-03 ENCOUNTER — Telehealth: Payer: Self-pay | Admitting: Pulmonary Disease

## 2012-02-03 NOTE — Telephone Encounter (Signed)
Spoke with Steward Drone @ [facility], who says she only got 1 page of 2, no cover sheet. I informed  Her when we have referral order we usually do not send cover sheets.  Willleave msg for Libby to call her Mon am for clarification .Kandice Hams

## 2012-02-03 NOTE — Telephone Encounter (Signed)
Order that was faxed yesterday did not come through clearly and they are asking that order be re-faxed to DME- [facility]. I will forward message to [facility] to re-fax. Thanks.  Carron Curie, CMA

## 2012-02-06 ENCOUNTER — Telehealth: Payer: Self-pay | Admitting: Pulmonary Disease

## 2012-02-06 NOTE — Telephone Encounter (Signed)
lmomtcb  

## 2012-02-06 NOTE — Telephone Encounter (Signed)
Steward Drone called back.  Order was placed on 02/02/12:  Let his DME company know that a February 2013 ABG was 7.47/74/70 at rest during the day. I would like the company to start him on BIPAP pressures 12/6 with a device that can monitor him at home (compliance, smart card) and generate a report for [place] to see how he is doing. [facility]ME[facility]  ---  Steward Drone states that pt is currently on bipap ASV Auto.  Reports he had a sleep study on May 13, 2010 at [facility] in [place] at which time he failed bipap Steward Drone reports unable to fax [place] sleep study results d/t HIPPA even though we sent order on bipap).  States on December 23, 2011, pt was seen by Neuro dr at [facility].  Per Steward Drone, neuro OV notes read "pt is doing well on bipap ASV on Auto with excellent compliance.  Wearing approx 9 hours night."  Steward Drone is concerned about the order she received from our office on July 25.  States it is unlikey to go from ASV to bipap when previous sleep study has shown pt failed bipap.  She is requesting clarification on this.  She is aware Dr. Kendrick Fries if off until Aug 5 and ok with this.  Dr. Kendrick Fries, pls advise.  Thank you.

## 2012-02-13 NOTE — Telephone Encounter (Signed)
Steward Drone returned triage's call.  Advised per BQ ok to keep pt on ASV as written.  Steward Drone verbalized understanding & stated nothing further needed at this time.  Antionette Fairy

## 2012-02-13 NOTE — Telephone Encounter (Signed)
Called, spoke with Steward Drone to verify she received all information needed.  She is aware of below statement per Dr. Kendrick Fries and voiced no further questions/concerns at this time.

## 2012-02-13 NOTE — Telephone Encounter (Signed)
lmomtcb  

## 2012-02-13 NOTE — Telephone Encounter (Signed)
OK to keep him on ASV as written.  I was under the impression he was on CPAP.

## 2012-02-14 ENCOUNTER — Encounter: Payer: Self-pay | Admitting: Pulmonary Disease

## 2012-02-15 ENCOUNTER — Telehealth: Payer: Self-pay | Admitting: *Deleted

## 2012-02-15 ENCOUNTER — Encounter: Payer: Self-pay | Admitting: Family Medicine

## 2012-02-15 NOTE — Telephone Encounter (Signed)
Message copied by Christen Butter on Wed Feb 15, 2012  1:49 PM ------      Message from: Veto Kemps B      Created: Tue Feb 14, 2012  5:33 PM       L,            Please let him know that I saw his PFT's and were consistent with his restrictive lung disease.  I explained to this to him in clinic and did not find the results surprising.            Kipp Brood

## 2012-02-15 NOTE — Telephone Encounter (Signed)
ATC pt with results. LMTCB.

## 2012-02-16 ENCOUNTER — Other Ambulatory Visit: Payer: Self-pay | Admitting: *Deleted

## 2012-02-16 MED ORDER — DIGOXIN 125 MCG PO TABS: 125.0000 ug | ORAL_TABLET | Freq: Every day | ORAL | Status: DC

## 2012-02-16 NOTE — Telephone Encounter (Signed)
Refilled Digoxin. 

## 2012-02-17 NOTE — Telephone Encounter (Signed)
LMTCB

## 2012-02-21 ENCOUNTER — Telehealth: Payer: Self-pay

## 2012-02-21 NOTE — Telephone Encounter (Signed)
Christian [name],pts daughter called; pt wants to start a multivitamin; wants to know if Centrum Silver is OK or what would Dr Para March recommend. Pt also is in constant pain; recently had MRI by Dr Chestine Spore neurologist for shoulder,neck and back pain; has not gotten results yet but [name] is going to request Dr Ophelia Charter notes and MRI report be sent to Dr Para March for his review and input.[place] [facility].

## 2012-02-21 NOTE — Telephone Encounter (Signed)
LMOVM

## 2012-02-21 NOTE — Telephone Encounter (Signed)
Jennifer advised  

## 2012-02-21 NOTE — Telephone Encounter (Signed)
pts daughter [name] called back; when left facility after getting MRI pt was given a CD of scan. Does Dr Para March want CD brought to office for his review.Please advise.

## 2012-02-21 NOTE — Telephone Encounter (Signed)
If they'll drop it off I'll look at it.  Thanks.

## 2012-02-21 NOTE — Telephone Encounter (Signed)
Generic multivitamin would be reasonable, centrum silver or equivalent.  I'll await the MR results.  Thanks.

## 2012-02-28 NOTE — Telephone Encounter (Signed)
LMTCB

## 2012-03-02 ENCOUNTER — Telehealth: Payer: Self-pay | Admitting: *Deleted

## 2012-03-02 MED ORDER — CELECOXIB 200 MG PO CAPS: 200.0000 mg | ORAL_CAPSULE | Freq: Two times a day (BID) | ORAL | Status: AC

## 2012-03-02 NOTE — Telephone Encounter (Signed)
I would try celebrex 200mg  a day.  Rx sent.

## 2012-03-02 NOTE — Telephone Encounter (Signed)
Daughter is asking if the patient can get something for pain other than Tylenol.  Dr. Ronelle Nigh office did the latest MRI.  They didn't find anything critical or any kind of spinal problem.  They only found arthritis.  They were supposed to fax the report.

## 2012-03-02 NOTE — Telephone Encounter (Signed)
Patient's daughter advised.  

## 2012-03-11 ENCOUNTER — Encounter: Payer: Self-pay | Admitting: Family Medicine

## 2012-03-14 ENCOUNTER — Other Ambulatory Visit: Payer: Self-pay | Admitting: *Deleted

## 2012-03-14 ENCOUNTER — Other Ambulatory Visit: Payer: Self-pay

## 2012-03-14 MED ORDER — AMIODARONE HCL 200 MG PO TABS: 200.0000 mg | ORAL_TABLET | Freq: Every day | ORAL | Status: DC

## 2012-03-14 NOTE — Telephone Encounter (Signed)
Refill amiodarone 200 mg take one tablet daily.  

## 2012-03-14 NOTE — Telephone Encounter (Signed)
Refilled Amiodarone. 

## 2012-04-04 ENCOUNTER — Ambulatory Visit (INDEPENDENT_AMBULATORY_CARE_PROVIDER_SITE_OTHER): Payer: PRIVATE HEALTH INSURANCE

## 2012-04-04 DIAGNOSIS — Z23 Encounter for immunization: Secondary | ICD-10-CM

## 2012-04-06 ENCOUNTER — Ambulatory Visit: Payer: Self-pay | Admitting: Neurology

## 2012-04-10 ENCOUNTER — Encounter: Payer: Self-pay | Admitting: Family Medicine

## 2012-04-20 ENCOUNTER — Encounter: Payer: Self-pay | Admitting: Family Medicine

## 2012-04-20 ENCOUNTER — Ambulatory Visit (INDEPENDENT_AMBULATORY_CARE_PROVIDER_SITE_OTHER): Payer: PRIVATE HEALTH INSURANCE | Admitting: Family Medicine

## 2012-04-20 VITALS — BP 122/54 | HR 72 | Temp 97.6°F | Wt 179.0 lb

## 2012-04-20 DIAGNOSIS — R29898 Other symptoms and signs involving the musculoskeletal system: Secondary | ICD-10-CM

## 2012-04-20 DIAGNOSIS — I4891 Unspecified atrial fibrillation: Secondary | ICD-10-CM

## 2012-04-20 DIAGNOSIS — I10 Essential (primary) hypertension: Secondary | ICD-10-CM

## 2012-04-20 LAB — BASIC METABOLIC PANEL
BUN: 20 mg/dL (ref 6–23)
Chloride: 89 mEq/L — ABNORMAL LOW (ref 96–112)
Glucose, Bld: 91 mg/dL (ref 70–99)
Potassium: 4.7 mEq/L (ref 3.5–5.1)

## 2012-04-20 NOTE — Patient Instructions (Addendum)
Don't change your meds for now.  I'll get your labs and neuro report.  I'll talk with cardiology and pulmonary.  We'll be in touch at that point.

## 2012-04-22 NOTE — Progress Notes (Signed)
"  this feels like it did before" before he had the episode of CO2 retention.  He has been using BiPap, sleeping up to 12 hours but has been more short of breath recently.  He'll intermittently get lightheaded and has been persistently fatigued.  Weight has been stable recently, in the upper 170s.  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    He has noted a resting hand tremor in the hands and weakness in his legs.  He has been evaluated by neurology and I am awaiting records.    He had an episode of possible syncope prev in the week. He can't provide many details of the event.  He was in a chair at home and awoke; he didn't know if he has suddenly fallen asleep or if he had true syncope.  He thinks it lasted about 45 minutes.  He doesn't recall having CP before that episode or otherwise.    He continues on torsemide and xarelto, among other meds.  He is compliant with meds.   Meds, vitals, and allergies reviewed.   ROS: See HPI.  Otherwise, noncontributory.  nad but tired appearing and with flat affect Mmm RRR not tachy with murmur noted ctab w/o wheeze He has a B resting hand tremor but strength is wnl in the BUE.  He does have weakness of hip flexors B DTR are symmetric in BUE and BLE BLE edema at baseline 1-2+  EKG w/o sig change from prev.  Old and current reviewed.

## 2012-04-22 NOTE — Assessment & Plan Note (Signed)
Now with tremor, fatigue, flat affect.  I will get neuro notes and notify cards and pulm clinic about this situation.  The cause is unclear to me.  He CO2 is up on BMET, but not drastically elevated.  I would recheck given the change in Cr.  He doesn't appear toxic and he'll have family supervision over the next few days, instead of living at home alone.  I don't have documented h/o recent syncope and I wonder if this was due to  atypical onset of sleep.  >25 min spent with face to face with patient, >50% counseling and/or coordinating care.

## 2012-04-23 ENCOUNTER — Telehealth: Payer: Self-pay | Admitting: Pulmonary Disease

## 2012-04-23 ENCOUNTER — Inpatient Hospital Stay: Payer: Self-pay | Admitting: Internal Medicine

## 2012-04-23 ENCOUNTER — Telehealth: Payer: Self-pay | Admitting: *Deleted

## 2012-04-23 DIAGNOSIS — R0602 Shortness of breath: Secondary | ICD-10-CM

## 2012-04-23 LAB — CBC WITH DIFFERENTIAL/PLATELET
Basophil #: 0.1 10*3/uL (ref 0.0–0.1)
Basophil %: 1.3 %
Eosinophil %: 3.1 %
HGB: 10.2 g/dL — ABNORMAL LOW (ref 13.0–18.0)
Lymphocyte %: 10.6 %
MCH: 24.1 pg — ABNORMAL LOW (ref 26.0–34.0)
MCHC: 31.4 g/dL — ABNORMAL LOW (ref 32.0–36.0)
MCV: 77 fL — ABNORMAL LOW (ref 80–100)
Monocyte %: 10.7 %
Neutrophil %: 74.3 %
Platelet: 238 10*3/uL (ref 150–440)
RBC: 4.21 10*6/uL — ABNORMAL LOW (ref 4.40–5.90)
WBC: 4.7 10*3/uL (ref 3.8–10.6)

## 2012-04-23 LAB — COMPREHENSIVE METABOLIC PANEL
Albumin: 3.4 g/dL (ref 3.4–5.0)
Alkaline Phosphatase: 124 U/L (ref 50–136)
Co2: 40 mmol/L (ref 21–32)
Creatinine: 1.73 mg/dL — ABNORMAL HIGH (ref 0.60–1.30)
EGFR (African American): 45 — ABNORMAL LOW
EGFR (Non-African Amer.): 39 — ABNORMAL LOW
Glucose: 88 mg/dL (ref 65–99)
Osmolality: 277 (ref 275–301)
Potassium: 4.4 mmol/L (ref 3.5–5.1)
SGPT (ALT): 33 U/L (ref 12–78)
Sodium: 137 mmol/L (ref 136–145)

## 2012-04-23 LAB — DIGOXIN LEVEL: Digoxin: 2.06 ng/mL

## 2012-04-23 LAB — PRO B NATRIURETIC PEPTIDE: B-Type Natriuretic Peptide: 1959 pg/mL — ABNORMAL HIGH (ref 0–125)

## 2012-04-23 NOTE — Telephone Encounter (Signed)
lmomtcb x1 for jennifer 

## 2012-04-23 NOTE — Telephone Encounter (Signed)
Spoke with daughter and notified of recs per BQ She verbalized understanding OV with BQ for 10/29  Order was sent to [facility]

## 2012-04-23 NOTE — Telephone Encounter (Signed)
Spoke to Press photographer at [employer].

## 2012-04-23 NOTE — Telephone Encounter (Signed)
They are going to [facility] and are asking if we could call ahead to let them know they are coming?

## 2012-04-23 NOTE — Telephone Encounter (Signed)
Patient's daughter says that Dr. Ulyses Jarred office has already contacted her about scheduling the appt and the tests.  She asked what the tests were for and the pulmonary nurse didn't know.  Also, this could not be scheduled for 2 weeks.  Christian [name] is very concerned that her Dad's condition deteriorated so quickly and they are having to wait so long to be seen by specialists and get these tests.

## 2012-04-23 NOTE — Telephone Encounter (Signed)
Yes, ER now.

## 2012-04-23 NOTE — Telephone Encounter (Signed)
Daughter advised of information from previous note earlier this morning.  She says she wants to update [place] with what has been happening since earlier today.  Patient cannot stand, is very dizzy, he can't open his eyes because it hurts so bad.  He says he is seeing blue splotches.  Daughter says he is with his sister right now and he is becoming progressively worse by the hour.  Should they take him to the ER?

## 2012-04-23 NOTE — Telephone Encounter (Signed)
Please call the charge RN at the ER re: his condition.  Thanks.

## 2012-04-23 NOTE — Telephone Encounter (Signed)
Message copied by Christen Butter on Mon Apr 23, 2012  9:15 AM ------      Message from: Lupita Leash      Created: Mon Apr 23, 2012  8:04 AM       Verlon Au,            Can we see get him in this month?  We also need to schedule a MIP/MEP to be done prior to that visit.            Thanks,            Kipp Brood            ----- Message -----         From: Joaquim Nam, MD         Sent: 04/22/2012   9:00 PM           To: Antonieta Iba, MD, Lupita Leash, MD            Y'all,            His leg weakness is worse and the family/pt describe his activity/condition and "like it was before the CO2 retention."  Still on bipap and compliant with meds.             He has a tremor now.  I doubt syncope is true here- see note.  I'll get neuro notes.              I don't know if this could this be parkinsonism or similar.             I don't know which way to go on this.  I'm rechecking the BMET due to mild inc in Cr.              I am open to any suggestions.             Many thanks.

## 2012-04-23 NOTE — Telephone Encounter (Signed)
Please call her back.  Let me get the recheck of labs done, the neuro notes and I'll await input from Dr. Mariah Milling.  Let me get more information and the we'll go from there.  I understand her concern and this is the best way I know to handle it.  We may need to see about possibly changing the scheduled date, but I need more information first.

## 2012-04-24 ENCOUNTER — Other Ambulatory Visit: Payer: PRIVATE HEALTH INSURANCE

## 2012-04-24 DIAGNOSIS — I5033 Acute on chronic diastolic (congestive) heart failure: Secondary | ICD-10-CM

## 2012-04-24 DIAGNOSIS — I4891 Unspecified atrial fibrillation: Secondary | ICD-10-CM

## 2012-04-24 LAB — CBC WITH DIFFERENTIAL/PLATELET
Basophil #: 0 10*3/uL (ref 0.0–0.1)
Eosinophil #: 0.2 10*3/uL (ref 0.0–0.7)
HCT: 29.6 % — ABNORMAL LOW (ref 40.0–52.0)
HGB: 9.4 g/dL — ABNORMAL LOW (ref 13.0–18.0)
Lymphocyte %: 13.8 %
MCHC: 31.8 g/dL — ABNORMAL LOW (ref 32.0–36.0)
Neutrophil #: 2.8 10*3/uL (ref 1.4–6.5)
Neutrophil %: 67.9 %
Platelet: 213 10*3/uL (ref 150–440)
RDW: 15.9 % — ABNORMAL HIGH (ref 11.5–14.5)
WBC: 4.1 10*3/uL (ref 3.8–10.6)

## 2012-04-24 LAB — BASIC METABOLIC PANEL
Anion Gap: 4 — ABNORMAL LOW (ref 7–16)
Calcium, Total: 8.1 mg/dL — ABNORMAL LOW (ref 8.5–10.1)
Chloride: 94 mmol/L — ABNORMAL LOW (ref 98–107)
Co2: 42 mmol/L (ref 21–32)
EGFR (African American): 54 — ABNORMAL LOW
EGFR (Non-African Amer.): 47 — ABNORMAL LOW
Glucose: 81 mg/dL (ref 65–99)
Osmolality: 281 (ref 275–301)

## 2012-04-24 NOTE — Telephone Encounter (Signed)
lmomtcb x 2  

## 2012-04-25 LAB — BASIC METABOLIC PANEL
Calcium, Total: 8.4 mg/dL — ABNORMAL LOW (ref 8.5–10.1)
Chloride: 92 mmol/L — ABNORMAL LOW (ref 98–107)
Co2: 41 mmol/L (ref 21–32)
Creatinine: 1.2 mg/dL (ref 0.60–1.30)
EGFR (Non-African Amer.): >60
Glucose: 86 mg/dL (ref 65–99)
Osmolality: 275 (ref 275–301)
Potassium: 4.1 mmol/L (ref 3.5–5.1)
Sodium: 138 mmol/L (ref 136–145)

## 2012-04-25 LAB — CBC WITH DIFFERENTIAL/PLATELET
Basophil #: 0 10*3/uL (ref 0.0–0.1)
Eosinophil #: 0.1 10*3/uL (ref 0.0–0.7)
Eosinophil %: 3.1 %
HCT: 29 % — ABNORMAL LOW (ref 40.0–52.0)
Lymphocyte %: 8.7 %
MCHC: 31 g/dL — ABNORMAL LOW (ref 32.0–36.0)
Monocyte #: 0.6 x10 3/mm (ref 0.2–1.0)
Neutrophil %: 75.1 %
Platelet: 195 10*3/uL (ref 150–440)
RDW: 15.7 % — ABNORMAL HIGH (ref 11.5–14.5)

## 2012-04-25 LAB — PRO B NATRIURETIC PEPTIDE: B-Type Natriuretic Peptide: 1688 pg/mL — ABNORMAL HIGH (ref 0–125)

## 2012-04-25 LAB — HEPATIC FUNCTION PANEL A (ARMC)
Alkaline Phosphatase: 121 U/L (ref 50–136)
Bilirubin,Total: 0.8 mg/dL (ref 0.2–1.0)
SGOT(AST): 20 U/L (ref 15–37)
SGPT (ALT): 29 U/L (ref 12–78)
Total Protein: 7.4 g/dL (ref 6.4–8.2)

## 2012-04-25 LAB — TSH: Thyroid Stimulating Horm: 2.92 u[IU]/mL

## 2012-04-25 LAB — MAGNESIUM: Magnesium: 1.8 mg/dL

## 2012-04-25 LAB — IRON AND TIBC
Iron Bind.Cap.(Total): 398 ug/dL (ref 250–450)
Unbound Iron-Bind.Cap.: 363 ug/dL

## 2012-04-25 LAB — FERRITIN: Ferritin (ARMC): 89 ng/mL (ref 8–388)

## 2012-04-25 LAB — AMMONIA: Ammonia, Plasma: 52 mcmol/L — ABNORMAL HIGH (ref 11–32)

## 2012-04-25 LAB — CK: CK, Total: 52 U/L (ref 35–232)

## 2012-04-25 LAB — LACTATE DEHYDROGENASE: LDH: 201 U/L (ref 85–241)

## 2012-04-25 NOTE — Telephone Encounter (Signed)
LMTCBx3. [name], CMA  

## 2012-04-26 ENCOUNTER — Telehealth: Payer: Self-pay | Admitting: Cardiovascular Disease

## 2012-04-26 DIAGNOSIS — I5031 Acute diastolic (congestive) heart failure: Secondary | ICD-10-CM

## 2012-04-26 LAB — CBC WITH DIFFERENTIAL/PLATELET
Basophil %: 0.5 %
Eosinophil #: 0.1 10*3/uL (ref 0.0–0.7)
Eosinophil %: 1.4 %
HCT: 27.2 % — ABNORMAL LOW (ref 40.0–52.0)
HGB: 8.4 g/dL — ABNORMAL LOW (ref 13.0–18.0)
Lymphocyte #: 0.3 10*3/uL — ABNORMAL LOW (ref 1.0–3.6)
Lymphocyte %: 6.2 %
Monocyte #: 0.6 x10 3/mm (ref 0.2–1.0)
Neutrophil #: 3.8 10*3/uL (ref 1.4–6.5)
RBC: 3.57 10*6/uL — ABNORMAL LOW (ref 4.40–5.90)
WBC: 4.8 10*3/uL (ref 3.8–10.6)

## 2012-04-26 LAB — APTT: Activated PTT: 41 secs — ABNORMAL HIGH (ref 23.6–35.9)

## 2012-04-26 LAB — BASIC METABOLIC PANEL
Calcium, Total: 8.9 mg/dL (ref 8.5–10.1)
Chloride: 90 mmol/L — ABNORMAL LOW (ref 98–107)
Co2: 43 mmol/L (ref 21–32)
Glucose: 77 mg/dL (ref 65–99)
Sodium: 137 mmol/L (ref 136–145)

## 2012-04-26 LAB — PROTIME-INR: INR: 1.4

## 2012-04-26 LAB — SEDIMENTATION RATE: Erythrocyte Sed Rate: 20 mm/hr (ref 0–20)

## 2012-04-26 NOTE — Telephone Encounter (Signed)
fyi

## 2012-04-26 NOTE — Telephone Encounter (Signed)
Pt's daughter states the pt is in [facility] for fluid buildup and a lot of other symptoms.  She thought Dr. Mariah Milling would like to stop by and check on her dad.  She is checking to see if this will happen.  Please call back to advise.

## 2012-04-26 NOTE — Telephone Encounter (Signed)
lmtcb advising if anything further was needed to call back. Will sign off message per triage protocol

## 2012-04-27 ENCOUNTER — Encounter: Payer: Self-pay | Admitting: Internal Medicine

## 2012-04-27 LAB — CBC WITH DIFFERENTIAL/PLATELET
Basophil #: 0 10*3/uL (ref 0.0–0.1)
Eosinophil %: 1.8 %
HGB: 8.2 g/dL — ABNORMAL LOW (ref 13.0–18.0)
Lymphocyte %: 9.2 %
MCH: 23 pg — ABNORMAL LOW (ref 26.0–34.0)
MCV: 77 fL — ABNORMAL LOW (ref 80–100)
Monocyte %: 13.5 %
Neutrophil %: 74.9 %
Platelet: 188 10*3/uL (ref 150–440)
RBC: 3.54 10*6/uL — ABNORMAL LOW (ref 4.40–5.90)
RDW: 15.5 % — ABNORMAL HIGH (ref 11.5–14.5)

## 2012-04-27 LAB — COMPREHENSIVE METABOLIC PANEL
Albumin: 2.8 g/dL — ABNORMAL LOW (ref 3.4–5.0)
Alkaline Phosphatase: 93 U/L (ref 50–136)
BUN: 17 mg/dL (ref 7–18)
Bilirubin,Total: 0.5 mg/dL (ref 0.2–1.0)
Chloride: 91 mmol/L — ABNORMAL LOW (ref 98–107)
Co2: >45 mmol/L (ref 21–32)
Creatinine: 1.28 mg/dL (ref 0.60–1.30)
EGFR (African American): >60
EGFR (Non-African Amer.): 56 — ABNORMAL LOW
Glucose: 110 mg/dL — ABNORMAL HIGH (ref 65–99)
Osmolality: 278 (ref 275–301)
SGOT(AST): 20 U/L (ref 15–37)
SGPT (ALT): 21 U/L (ref 12–78)

## 2012-04-27 LAB — AMMONIA: Ammonia, Plasma: 57 mcmol/L — ABNORMAL HIGH (ref 11–32)

## 2012-04-27 LAB — HEMATOCRIT
HCT: 24.7 % — ABNORMAL LOW (ref 40.0–52.0)
HCT: 23.9 % — ABNORMAL LOW (ref 40.0–52.0)

## 2012-04-27 LAB — HEMOGLOBIN: HGB: 7.6 g/dL — ABNORMAL LOW (ref 13.0–18.0)

## 2012-04-27 LAB — URINE IEP, RANDOM

## 2012-04-28 LAB — CBC WITH DIFFERENTIAL/PLATELET
Basophil #: 0 10*3/uL (ref 0.0–0.1)
Eosinophil #: 0.1 10*3/uL (ref 0.0–0.7)
Lymphocyte %: 9.7 %
MCHC: 29.8 g/dL — ABNORMAL LOW (ref 32.0–36.0)
Monocyte #: 0.6 x10 3/mm (ref 0.2–1.0)
Monocyte %: 13.8 %
Neutrophil #: 3 10*3/uL (ref 1.4–6.5)
Neutrophil %: 74 %
Platelet: 169 10*3/uL (ref 150–440)
RDW: 15.9 % — ABNORMAL HIGH (ref 11.5–14.5)
WBC: 4 10*3/uL (ref 3.8–10.6)

## 2012-04-28 LAB — BASIC METABOLIC PANEL
Anion Gap: 3 — ABNORMAL LOW (ref 7–16)
BUN: 14 mg/dL (ref 7–18)
Calcium, Total: 8.7 mg/dL (ref 8.5–10.1)
Calcium, Total: 8.8 mg/dL (ref 8.5–10.1)
Chloride: 86 mmol/L — ABNORMAL LOW (ref 98–107)
Chloride: 87 mmol/L — ABNORMAL LOW (ref 98–107)
Co2: >45 mmol/L (ref 21–32)
Co2: 44 mmol/L (ref 21–32)
Creatinine: 0.89 mg/dL (ref 0.60–1.30)
EGFR (Non-African Amer.): >60
EGFR (Non-African Amer.): >60
Osmolality: 272 (ref 275–301)
Potassium: 4.1 mmol/L (ref 3.5–5.1)
Potassium: 4.1 mmol/L (ref 3.5–5.1)
Sodium: 134 mmol/L — ABNORMAL LOW (ref 136–145)

## 2012-04-28 LAB — AMMONIA: Ammonia, Plasma: 47 mcmol/L — ABNORMAL HIGH (ref 11–32)

## 2012-04-29 LAB — CBC WITH DIFFERENTIAL/PLATELET
Basophil #: 0 10*3/uL (ref 0.0–0.1)
Basophil %: 0.1 %
Eosinophil #: 0 10*3/uL (ref 0.0–0.7)
HCT: 28.2 % — ABNORMAL LOW (ref 40.0–52.0)
Lymphocyte #: 0.2 10*3/uL — ABNORMAL LOW (ref 1.0–3.6)
Lymphocyte %: 6.2 %
MCHC: 31.6 g/dL — ABNORMAL LOW (ref 32.0–36.0)
MCV: 76 fL — ABNORMAL LOW (ref 80–100)
Monocyte %: 0.8 %
Neutrophil #: 2.7 10*3/uL (ref 1.4–6.5)
Neutrophil %: 92.9 %
Platelet: 211 10*3/uL (ref 150–440)
RBC: 3.7 10*6/uL — ABNORMAL LOW (ref 4.40–5.90)
RDW: 15.9 % — ABNORMAL HIGH (ref 11.5–14.5)
WBC: 2.9 10*3/uL — ABNORMAL LOW (ref 3.8–10.6)

## 2012-04-29 LAB — BASIC METABOLIC PANEL
Creatinine: 0.94 mg/dL (ref 0.60–1.30)
EGFR (African American): >60
EGFR (Non-African Amer.): >60
Glucose: 135 mg/dL — ABNORMAL HIGH (ref 65–99)
Potassium: 4 mmol/L (ref 3.5–5.1)
Sodium: 134 mmol/L — ABNORMAL LOW (ref 136–145)

## 2012-04-30 LAB — CBC WITH DIFFERENTIAL/PLATELET
Eosinophil #: 0 10*3/uL (ref 0.0–0.7)
Eosinophil %: 0 %
HCT: 30.3 % — ABNORMAL LOW (ref 40.0–52.0)
Lymphocyte #: 0.1 10*3/uL — ABNORMAL LOW (ref 1.0–3.6)
MCHC: 31.4 g/dL — ABNORMAL LOW (ref 32.0–36.0)
Monocyte #: 0.1 x10 3/mm — ABNORMAL LOW (ref 0.2–1.0)
Monocyte %: 1.4 %
Neutrophil #: 6 10*3/uL (ref 1.4–6.5)
Neutrophil %: 96.1 %
Platelet: 267 10*3/uL (ref 150–440)
RBC: 3.94 10*6/uL — ABNORMAL LOW (ref 4.40–5.90)
RDW: 16 % — ABNORMAL HIGH (ref 11.5–14.5)
WBC: 6.2 10*3/uL (ref 3.8–10.6)

## 2012-04-30 LAB — BASIC METABOLIC PANEL
Anion Gap: 3 — ABNORMAL LOW (ref 7–16)
BUN: 22 mg/dL — ABNORMAL HIGH (ref 7–18)
Chloride: 85 mmol/L — ABNORMAL LOW (ref 98–107)
Creatinine: 1.06 mg/dL (ref 0.60–1.30)
EGFR (Non-African Amer.): >60
Glucose: 143 mg/dL — ABNORMAL HIGH (ref 65–99)
Potassium: 4.4 mmol/L (ref 3.5–5.1)

## 2012-04-30 NOTE — Progress Notes (Signed)
We are having trouble putting our finger on his neuro issue while he has been at [facility] Lots of testing done by neurology Clearly seems to be neurologic, neuromuscular Tremendously weak,  eeg now showing moderate slowing, worse than before Suspect he will need placement in assisted living facility/rehab Will keep you posted thx tim

## 2012-05-01 LAB — BASIC METABOLIC PANEL
BUN: 31 mg/dL — ABNORMAL HIGH (ref 7–18)
Calcium, Total: 9.3 mg/dL (ref 8.5–10.1)
Calcium, Total: 9.4 mg/dL (ref 8.5–10.1)
Chloride: 84 mmol/L — ABNORMAL LOW (ref 98–107)
Chloride: 82 mmol/L — ABNORMAL LOW (ref 98–107)
Co2: 45 mmol/L (ref 21–32)
Creatinine: 1.05 mg/dL (ref 0.60–1.30)
EGFR (African American): >60
EGFR (African American): >60
EGFR (Non-African Amer.): >60
Glucose: 135 mg/dL — ABNORMAL HIGH (ref 65–99)
Glucose: 203 mg/dL — ABNORMAL HIGH (ref 65–99)
Osmolality: 273 (ref 275–301)
Potassium: 5.6 mmol/L — ABNORMAL HIGH (ref 3.5–5.1)
Potassium: 4.9 mmol/L (ref 3.5–5.1)
Sodium: 131 mmol/L — ABNORMAL LOW (ref 136–145)
Sodium: 130 mmol/L — ABNORMAL LOW (ref 136–145)

## 2012-05-02 ENCOUNTER — Encounter: Payer: Self-pay | Admitting: Internal Medicine

## 2012-05-02 ENCOUNTER — Telehealth: Payer: Self-pay

## 2012-05-02 LAB — BASIC METABOLIC PANEL
Anion Gap: 5 — ABNORMAL LOW (ref 7–16)
Chloride: 82 mmol/L — ABNORMAL LOW (ref 98–107)
Co2: 44 mmol/L (ref 21–32)
Creatinine: 0.95 mg/dL (ref 0.60–1.30)
Osmolality: 272 (ref 275–301)
Sodium: 131 mmol/L — ABNORMAL LOW (ref 136–145)

## 2012-05-02 LAB — URINALYSIS, COMPLETE
Bacteria: NONE SEEN
Bilirubin,UR: NEGATIVE
Glucose,UR: NEGATIVE mg/dL (ref 0–75)
Ketone: NEGATIVE
Specific Gravity: 1.01 (ref 1.003–1.030)
WBC UR: 1 /HPF (ref 0–5)

## 2012-05-02 LAB — CBC WITH DIFFERENTIAL/PLATELET
HCT: 29.3 % — ABNORMAL LOW (ref 40.0–52.0)
HGB: 9.3 g/dL — ABNORMAL LOW (ref 13.0–18.0)
MCH: 24.7 pg — ABNORMAL LOW (ref 26.0–34.0)
MCHC: 31.8 g/dL — ABNORMAL LOW (ref 32.0–36.0)
MCV: 78 fL — ABNORMAL LOW (ref 80–100)
Monocytes: 4 %
Platelet: 224 10*3/uL (ref 150–440)
RBC: 3.76 10*6/uL — ABNORMAL LOW (ref 4.40–5.90)
Segmented Neutrophils: 90 %
WBC: 6.2 10*3/uL (ref 3.8–10.6)

## 2012-05-02 NOTE — Telephone Encounter (Signed)
TCM  

## 2012-05-02 NOTE — Telephone Encounter (Signed)
Message copied by Marcelle Overlie on Wed May 02, 2012  2:41 PM ------      Message from: [place] E      Created: Wed May 02, 2012  2:30 PM      Regarding: tcm patient       tcm patient

## 2012-05-03 ENCOUNTER — Telehealth: Payer: Self-pay | Admitting: Pulmonary Disease

## 2012-05-03 ENCOUNTER — Telehealth: Payer: Self-pay | Admitting: Family Medicine

## 2012-05-03 ENCOUNTER — Encounter: Payer: Self-pay | Admitting: *Deleted

## 2012-05-03 NOTE — Telephone Encounter (Signed)
Pt's daughter called about her father's hospitalization.  I called and LMVOM 3 times on the number listed.  I will await her return call.

## 2012-05-03 NOTE — Telephone Encounter (Signed)
Pt daughter left a message on [employer], stating the pt is in the hospital now and wants test and upcoming appts cancelled. I LMTCBx1 to speak with her. Carron Curie, CMA

## 2012-05-03 NOTE — Telephone Encounter (Signed)
Pt d/c today 10/24 Will call for TCM 05/04/12

## 2012-05-04 ENCOUNTER — Encounter (HOSPITAL_COMMUNITY): Payer: PRIVATE HEALTH INSURANCE

## 2012-05-04 ENCOUNTER — Telehealth: Payer: Self-pay | Admitting: Family Medicine

## 2012-05-04 LAB — URINE CULTURE

## 2012-05-04 NOTE — Telephone Encounter (Signed)
LMTCB x2  
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
## 2012-05-07 NOTE — Telephone Encounter (Signed)
Called, spoke with pt's daughter, [name], who states pt was d/c'd from [facility] on Friday, Oct 25.  He is currently at [facility] for Rehab.  She would like to cancel appt tomorrow with Dr. Kendrick Fries and would also like to make sure the MIP/MEP tests are cancelled as well.  I have cancelled OV.  PCCs, will you pls help with cancelling the MIP/MEP at [facility].  Pt's daughter states she will call back to reschedule appts once she find out when he will be getting out of Rehab.  Will also route to Dr. Kendrick Fries as Lorain Childes.

## 2012-05-07 NOTE — Telephone Encounter (Signed)
I spoke with Marcelino Duster at [facility] and cancelled MIP/MEP on 05/03/12. Rhonda J Cobb

## 2012-05-07 NOTE — Telephone Encounter (Signed)
LMTCB x 3 

## 2012-05-07 NOTE — Telephone Encounter (Signed)
Facility confirmed appt

## 2012-05-07 NOTE — Telephone Encounter (Signed)
Returning call can be reached at [phone].Christian [name]

## 2012-05-07 NOTE — Telephone Encounter (Signed)
noted 

## 2012-05-08 ENCOUNTER — Telehealth: Payer: Self-pay

## 2012-05-08 ENCOUNTER — Encounter: Payer: Self-pay | Admitting: Cardiovascular Disease

## 2012-05-08 ENCOUNTER — Telehealth: Payer: Self-pay | Admitting: Family Medicine

## 2012-05-08 ENCOUNTER — Ambulatory Visit (INDEPENDENT_AMBULATORY_CARE_PROVIDER_SITE_OTHER): Payer: PRIVATE HEALTH INSURANCE | Admitting: Cardiovascular Disease

## 2012-05-08 ENCOUNTER — Ambulatory Visit: Payer: PRIVATE HEALTH INSURANCE | Admitting: Pulmonary Disease

## 2012-05-08 VITALS — BP 106/46 | HR 64 | Ht 71.0 in | Wt 178.0 lb

## 2012-05-08 DIAGNOSIS — I4891 Unspecified atrial fibrillation: Secondary | ICD-10-CM

## 2012-05-08 DIAGNOSIS — I2510 Atherosclerotic heart disease of native coronary artery without angina pectoris: Secondary | ICD-10-CM

## 2012-05-08 DIAGNOSIS — I10 Essential (primary) hypertension: Secondary | ICD-10-CM

## 2012-05-08 DIAGNOSIS — I5032 Chronic diastolic (congestive) heart failure: Secondary | ICD-10-CM

## 2012-05-08 NOTE — Telephone Encounter (Signed)
pts daughter left v/m requesting test results that Dr Para March is expecting from [facility].Please advise.

## 2012-05-08 NOTE — Telephone Encounter (Signed)
Christian [name] with [facility] left v/m requesting toxicology results faxed to [phone].Please advise.

## 2012-05-08 NOTE — Telephone Encounter (Signed)
I don't have them yet, will forward when arrived/reviewed.  Please see if they are available from [facility].

## 2012-05-08 NOTE — Telephone Encounter (Signed)
Christian [name] advised.  She asked if she could call back at the end of the week if they have not received anything, agreed.

## 2012-05-08 NOTE — Patient Instructions (Addendum)
Increase Lasix to 40 mg once daily.  I ordered labs (BMP) to be done in 1 week at [facility].  Follow up with Dr. Mariah Milling in 3 weeks.

## 2012-05-08 NOTE — Telephone Encounter (Signed)
Daughter advised.

## 2012-05-08 NOTE — Telephone Encounter (Signed)
I don't have them yet, will forward when arrived/reviewed.

## 2012-05-11 ENCOUNTER — Encounter: Payer: Self-pay | Admitting: Internal Medicine

## 2012-05-11 ENCOUNTER — Telehealth: Payer: Self-pay | Admitting: *Deleted

## 2012-05-11 ENCOUNTER — Encounter: Payer: Self-pay | Admitting: Cardiovascular Disease

## 2012-05-11 DIAGNOSIS — I5032 Chronic diastolic (congestive) heart failure: Secondary | ICD-10-CM | POA: Insufficient documentation

## 2012-05-11 NOTE — Telephone Encounter (Signed)
Arsenic level was not elevated.  That is all of the toxicology that I have.  I see nothing else listed as pending.  If anything else comes through, we'll notify them.  Please fax the arsenic report over.

## 2012-05-11 NOTE — Assessment & Plan Note (Signed)
He appears to be fluid overloaded today with progressive lower extremity edema since hospital discharge. Thus, I will increase the dose of Lasix to 40 mg once daily. I will request basic metabolic profile in one week. I also placed an order to place knee-high support stockings during the day with leg elevation.

## 2012-05-11 NOTE — Telephone Encounter (Signed)
Faxed for report from [facility].

## 2012-05-11 NOTE — Telephone Encounter (Signed)
[name] (daughter) notified and asked to notify the son.  She states she will tell him as they are going to visit their Dad this evening.  Report was faxed to [name] at [facility] and [name] was phoned with the results.

## 2012-05-11 NOTE — Assessment & Plan Note (Signed)
I recommend continuing rate control with metoprolol. He does not seem to be symptomatic when he is in atrial fibrillation. Thus, I will not start an antiarrhythmic medication and will not attempt to convert to sinus rhythm. I recommend resuming Xarelto once his hematuria resolves for at least one week. He might need the 15 mg dose depending on his renal function at that time. I will have him come back for a close followup regarding this.

## 2012-05-11 NOTE — Assessment & Plan Note (Signed)
No chest pain at this time. Continue medical therapy.  

## 2012-05-11 NOTE — Progress Notes (Signed)
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  h/o profound esophagitis, weight loss of more than 50 pounds, anorexia, lack of interest, food getting stuck in his throat, difficulty swallowing dry mouth, periods of hallucinations and memory problems. He presented recently to [facility] on October 14 with generalized weakness, shortness of breath, failure to thrive and mental status changes. He was noted to be fluid overloaded likely due to diastolic heart failure. This improved with diuresis. He had worsening of renal failure subsequently improved. He is known to have CO2 retention. He had a prolonged hospital stay and was discharged on the 23rd. He had severe hematuria due to trauma from the Foley catheter with blood loss requiring blood transfusion. Anticoagulation was held. He was evaluated by neurology. His mental status changes were attributed to hepatic encephalopathy with elevated ammonia level. This improved with the addition of lactulose. Amiodarone was discontinued due to concerns of amiodarone-induced lung toxicity. Digoxin was stopped due to worsening renal function. His heart rate was controlled with a beta blocker. The patient was discharged to rehabilitation. Overall, he has been feeling better. He did notice gradual increase in lower extremity edema since hospital discharge. He is only on a small dose Lasix. He is still having gross hematuria with clots.    Allergies  Allergen Reactions  . Crestor (Rosuvastatin Calcium)     myalgias  . Pradaxa (Dabigatran Etexilate Mesylate)     Inflammation in throat.  . Tramadol     Intolerant but not an allergy;  sedation     Current Outpatient Prescriptions on File Prior to Visit  Medication Sig Dispense Refill  . acetaminophen (TYLENOL) 500 MG tablet Take 500 mg by mouth every 6 (six) hours as needed.      . budesonide (PULMICORT) 0.5 MG/2ML nebulizer solution Take 0.5 mg by nebulization 2 (two) times daily.      . cetirizine (ZYRTEC) 10 MG tablet Take 10 mg by mouth daily.        . ferrous sulfate 325 (65 FE) MG tablet Take 325 mg by mouth daily.      . furosemide (LASIX) 20 MG tablet Take 20 mg by mouth daily.      Marland Kitchen glucose blood (ONE TOUCH ULTRA TEST) test strip Use as instructed to check sugar daily  50 each  5  . ipratropium-albuterol (DUONEB) 0.5-2.5 (3) MG/3ML SOLN Take 3 mLs by nebulization 4 (four) times daily - after meals and at bedtime.      . Multiple Vitamin (MULTIVITAMIN) tablet Take 1 tablet by mouth daily.      . nitroGLYCERIN (NITROSTAT) 0.4 MG SL tablet Place 0.4 mg under the tongue every 5 (five) minutes as needed.        Marland Kitchen omeprazole (PRILOSEC) 20 MG capsule Take 20 mg by mouth daily.       . Tamsulosin HCl (FLOMAX) 0.4 MG CAPS Take 1 capsule (0.4 mg total) by mouth daily after supper.  90 capsule  3  . DISCONTD: dabigatran (PRADAXA) 150 MG CAPS Take 1 capsule (150 mg total) by mouth every 12 (twelve) hours.  60 capsule  6  . DISCONTD: lisinopril (PRINIVIL,ZESTRIL) 10 MG tablet Take 1 tablet (10 mg total)  by mouth daily.  30 tablet  3  . DISCONTD: metFORMIN (GLUCOPHAGE) 500 MG tablet Take 1 tablet (500 mg total) by mouth 2 (two) times daily with a meal. Intolerant of >500mg  per day  180 tablet  3  . DISCONTD: sotalol (BETAPACE) 120 MG tablet Take 1 tablet (120 mg total) by mouth 2 (two) times daily.  60 tablet  6     Past Medical History  Diagnosis Date  . Coronary artery disease   . Diabetes mellitus   . Hyperlipidemia   . Hypertension   . GERD (gastroesophageal reflux disease)   . Arthritis   . Diastolic CHF, chronic   . Kidney stones   . OSA (obstructive sleep  apnea)   . Atrial fibrillation     DCCV 04/2010  . CO2 retention     during admission to [facility] 2013  . Acute renal failure      Past Surgical History  Procedure Date  . Coronary artery bypass graft 2009    x 3 @ [facility]  . Cardiac catheterization 2009    showing patent grafts with elevated pulmonary pressures  . Colonoscopy 2011    negative per report     Family History  Problem Relation Age of Onset  . Heart disease Brother   . Arthritis Mother   . Hypertension Mother   . Heart disease Father   . Hyperlipidemia Father   . Stroke Father   . Diabetes Father   . Heart disease Sister   . Aneurysm Mother   . Heart failure Father      History   Social History  . Marital Status: Divorced    Spouse Name: N/A    Number of Children: 2  . Years of Education: N/A   Occupational History  . Retired     [employer]   Social History Main Topics  . Smoking status: Former Smoker -- 1.5 packs/day for 30 years    Types: Cigarettes    Quit date: 07/11/1981  . Smokeless tobacco: Never Used  . Alcohol Use: No  . Drug Use: No  . Sexually Active: Not on file   Other Topics Concern  . Not on file   Social History Narrative   Divorced [address] son, 1 daughter, both localRetired from [employer]     ROS Constitutional: Negative for fever, chills, diaphoresis, activity change, appetite change and fatigue.  HENT: Negative for hearing loss, nosebleeds, congestion, sore throat, facial swelling, drooling, trouble swallowing, neck pain, voice change, sinus pressure and tinnitus.  Eyes: Negative for photophobia, pain, discharge and visual disturbance.  Respiratory: Negative for apnea, cough, chest tightness and wheezing.  Cardiovascular: Negative for chest pain, palpitations . Gastrointestinal: Negative for nausea, vomiting, abdominal pain, diarrhea, constipation, blood in stool and abdominal distention.  Genitourinary: Positive for hematuria. Musculoskeletal: Negative for myalgias,  back pain, joint swelling, arthralgias and gait problem.  Skin: Negative for color change, pallor, rash and wound.  Neurological: Negative for dizziness, tremors, seizures, syncope, speech difficulty, weakness, light-headedness, numbness and headaches.  Psychiatric/Behavioral: Negative for suicidal ideas, hallucinations, behavioral problems and agitation. The patient is not nervous/anxious.     PHYSICAL EXAM   BP 106/46  Pulse 64  Ht 5\' 11"  (1.803 m)  Wt 178 lb (80.74 kg)  BMI 24.83 kg/m2  Constitutional: He is oriented to person, place, and time. He appears well-developed and well-nourished. No distress.  HENT: No nasal discharge.  Head: Normocephalic and atraumatic.  Eyes: Pupils are equal and round. Right  eye exhibits no discharge. Left eye exhibits no discharge.  Neck: Normal range of motion. Neck supple. No JVD present. No thyromegaly present.  Cardiovascular: Normal rate, regular rhythm, normal heart sounds and. Exam reveals no gallop and no friction rub. No murmur heard.  Pulmonary/Chest: Effort normal and diminished breath sounds at the base. No stridor. No respiratory distress. He has no wheezes. He has no rales. He exhibits no tenderness.  Abdominal: Soft. Bowel sounds are normal. He exhibits no distension. There is no tenderness. There is no rebound and no guarding.  Musculoskeletal: Normal range of motion. He exhibits +2 edema and no tenderness.  Neurological: He is alert and oriented to person, place, and time. Coordination normal.  Skin: Skin is warm and dry. No rash noted. He is not diaphoretic. No erythema. No pallor.  Psychiatric: He has a normal mood and affect. His behavior is normal. Judgment and thought content normal.      EKG: Atrial fibrillation  -Nonspecific ST changes.  ABNORMAL    ASSESSMENT AND PLAN

## 2012-05-11 NOTE — Telephone Encounter (Signed)
I don't have anything yet, please see what you can get from the hospital.

## 2012-05-11 NOTE — Telephone Encounter (Signed)
[name] with [facility] request toxicology results faxed to [phone]. Angela contact # [phone].

## 2012-05-11 NOTE — Telephone Encounter (Signed)
Mr. Skillman daughter left a message late yesterday afternoon and called again this morning before I could put in the message.  She is wanting to know if the toxicology report was sent to you and if not, what can we do to try to find out the status.  She says they told her that it would take about a week to get results.  It was drawn on Tuesday, October 22 so it has already exceeded a week.  Please advise.

## 2012-05-15 LAB — COMPREHENSIVE METABOLIC PANEL
Albumin: 2.6 g/dL — ABNORMAL LOW (ref 3.4–5.0)
Anion Gap: 4 — ABNORMAL LOW (ref 7–16)
BUN: 16 mg/dL (ref 7–18)
Bilirubin,Total: 0.4 mg/dL (ref 0.2–1.0)
Calcium, Total: 8.7 mg/dL (ref 8.5–10.1)
Chloride: 93 mmol/L — ABNORMAL LOW (ref 98–107)
Creatinine: 0.89 mg/dL (ref 0.60–1.30)
EGFR (African American): >60
Glucose: 74 mg/dL (ref 65–99)
Potassium: 4.4 mmol/L (ref 3.5–5.1)
SGOT(AST): 18 U/L (ref 15–37)
SGPT (ALT): 26 U/L (ref 12–78)
Total Protein: 5.8 g/dL — ABNORMAL LOW (ref 6.4–8.2)

## 2012-05-15 LAB — AMMONIA: Ammonia, Plasma: 56 mcmol/L — ABNORMAL HIGH (ref 11–32)

## 2012-05-29 ENCOUNTER — Encounter: Payer: Self-pay | Admitting: Cardiovascular Disease

## 2012-05-29 ENCOUNTER — Ambulatory Visit (INDEPENDENT_AMBULATORY_CARE_PROVIDER_SITE_OTHER): Payer: PRIVATE HEALTH INSURANCE | Admitting: Cardiovascular Disease

## 2012-05-29 VITALS — BP 98/60 | HR 104 | Ht 71.0 in | Wt 191.5 lb

## 2012-05-29 DIAGNOSIS — I5032 Chronic diastolic (congestive) heart failure: Secondary | ICD-10-CM

## 2012-05-29 DIAGNOSIS — R0602 Shortness of breath: Secondary | ICD-10-CM

## 2012-05-29 DIAGNOSIS — J9610 Chronic respiratory failure, unspecified whether with hypoxia or hypercapnia: Secondary | ICD-10-CM

## 2012-05-29 DIAGNOSIS — I2510 Atherosclerotic heart disease of native coronary artery without angina pectoris: Secondary | ICD-10-CM

## 2012-05-29 DIAGNOSIS — R609 Edema, unspecified: Secondary | ICD-10-CM

## 2012-05-29 DIAGNOSIS — I4891 Unspecified atrial fibrillation: Secondary | ICD-10-CM

## 2012-05-29 NOTE — Progress Notes (Signed)
Patient ID: Christian [name], male    DOB: Mar 28, 1941, 71 y.o.   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   h/o profound esophagitis, weight loss of more than 50 pounds, anorexia, lack of interest, food getting stuck in his throat, difficulty swallowing dry mouth, periods of hallucinations and memory problems.  He presented to the hospital at [facility] September 02 2011 following an EGD that showed severe esophagitis, moderate gastritis,. He had mental status changes, shortness of breath, elevated cardiac enzymes. Blood gases showed hypercapnia and respiratory depression and he required BiPAP. It was initially felt his respiratory issues were secondary to sedation at the time of his EGD though he required respiratory support for several days. He had a long hospital course, long period in rehabilitation with good recovery.    cardioversion  for atrial fibrillation which was successful though in followup he converted back to atrial fibrillation. He is asymptomatic in June 2013 when seen in clinic while in atrial fibrillation . Baseline weight seems to fluctuate between 175 and 190 pounds.  Recent hospital admission mid-October for similar symptoms of failure to thrive, leg weakness, mental status changes, hypoxia. Significant neurologic workup was essentially unrevealing. In the end, he was started on lactulose for question of encephalopathy and high ammonia level. He is currently at [facility] in rehabilitation and is slowly regaining his strength. Edema of the lower extremities has been getting worse. He has had previous edema felt to be secondary to  lymphedema or venous insufficiency. He is wearing compression hose. He is currently not on anticoagulation despite recommendation to start xarelto by Dr. Kirke Corin on his last visit. He does have history of DVT. Now in chronic atrial fibrillation. His family is concerned that when he goes back home after rehabilitation, he will again decompensate. Today in clinic, he is not on amiodarone or digoxin as he was taking previously in June 2013 at that office visit.   Echocardiogram showed normal LV systolic function greater than 55%, borderline elevated right ventricular systolic pressures pulmonic aortic valve stenosis.   EKG : Atrial fibrillation with ventricular rate 104 beats per minute, nonspecific ST abnormality    Outpatient Encounter Prescriptions as of 05/29/2012  Medication Sig Dispense Refill  . acetaminophen (TYLENOL) 500 MG tablet Take 500 mg by mouth every 6 (six) hours as needed.      . budesonide (PULMICORT) 0.5 MG/2ML nebulizer solution Take 0.5 mg by nebulization 2 (two) times daily.      . cetirizine (ZYRTEC) 10 MG tablet Take 10 mg by mouth daily.        . diazepam (VALIUM) 2 MG tablet Take 2 mg by mouth every 4 (four) hours as needed.      . furosemide (LASIX) 40 MG tablet Take 40 mg by mouth daily.      Marland Kitchen glucose blood (ONE TOUCH ULTRA TEST) test strip Use as instructed to check sugar daily  50 each  5  . ipratropium-albuterol (DUONEB) 0.5-2.5 (3) MG/3ML SOLN Take 3 mLs by nebulization 4 (four) times daily - after meals and at bedtime.      Marland Kitchen lactulose, encephalopathy, (CHRONULAC) 10 GM/15ML SOLN Take 30 g by mouth 4 (four)  times daily.       . metoprolol tartrate (LOPRESSOR) 25 MG tablet Take 25 mg by mouth 2 (two) times daily.      . Multiple Vitamin (MULTIVITAMIN) tablet Take 1 tablet by mouth daily.      . nitroGLYCERIN (NITROSTAT) 0.4 MG SL tablet Place 0.4 mg under the tongue every 5 (five) minutes as needed.        . NON FORMULARY Oxygen @@ 2 liters      . nystatin cream  (MYCOSTATIN) Apply topically as needed.      Marland Kitchen omeprazole (PRILOSEC) 20 MG capsule Take 20 mg by mouth daily.       . Tamsulosin HCl (FLOMAX) 0.4 MG CAPS Take 1 capsule (0.4 mg total) by mouth daily after supper.  90 capsule  3  . [DISCONTINUED] ferrous sulfate 325 (65 FE) MG tablet Take 325 mg by mouth daily.      . [DISCONTINUED] furosemide (LASIX) 20 MG tablet Take 20 mg by mouth daily.        Review of Systems  HENT: Negative.   Eyes: Negative.   Cardiovascular: Positive for leg swelling.  Gastrointestinal: Negative.   Skin: Negative.   Neurological: Negative.   Hematological: Negative.   Psychiatric/Behavioral: Positive for dysphoric mood and decreased concentration.  All other systems reviewed and are negative.    BP 98/60  Pulse 104  Ht 5\' 11"  (1.803 m)  Wt 191 lb 8 oz (86.864 kg)  BMI 26.71 kg/m2  Physical Exam  Nursing note and vitals reviewed. Constitutional: He is oriented to person, place, and time. He appears well-developed and well-nourished.  HENT:  Head: Normocephalic.  Nose: Nose normal.  Mouth/Throat: Oropharynx is clear and moist.  Eyes: Conjunctivae normal are normal. Pupils are equal, round, and reactive to light.  Neck: Normal range of motion. Neck supple. No JVD present.  Cardiovascular: Normal rate, S1 normal, S2 normal and intact distal pulses.  An irregularly irregular rhythm present. Exam reveals no gallop and no friction rub.   Murmur heard.  Crescendo systolic murmur is present with a grade of 2/6       2+ nonpitting edema bilaterally, compression hose in place  Pulmonary/Chest: Effort normal and breath sounds normal. No respiratory distress. He has no wheezes. He has no rales. He exhibits no tenderness.  Abdominal: Soft. Bowel sounds are normal. He exhibits no distension. There is no tenderness.  Musculoskeletal: Normal range of motion. He exhibits no edema and no tenderness.  Lymphadenopathy:    He has no cervical adenopathy.  Neurological:  He is alert and oriented to person, place, and time. Coordination normal.  Skin: Skin is warm and dry. No rash noted. No erythema.  Psychiatric: He has a normal mood and affect. His behavior is normal. Judgment and thought content normal.           Assessment and Plan

## 2012-05-29 NOTE — Assessment & Plan Note (Signed)
He will continue on BiPAP at night. Currently on oxygen in the day.

## 2012-05-29 NOTE — Assessment & Plan Note (Addendum)
Family reports that his sotalol was discontinued when he arrived at [facility]. Details are unavailable. He is not on digoxin or amiodarone. I'm hesitant to restart this given previous neurologic issues and shortness of breath. He is currently on metoprolol the rate is poorly controlled. We will try multaq 400 mg twice a day with food. I suspect his poorly controlled heart rate may be contributing to shortness of breath. He is currently on oxygen in the daytime.

## 2012-05-29 NOTE — Assessment & Plan Note (Addendum)
I suspect this is multifactorial. No significant improvement in edema with increased Lasix from 20 mg to 40 mg. Family is hesitant to retry torsemide given recent hospital admission and uncertain etiology. We have suggested he watch his fluid intake and monitor his weight. Weight is up from his last clinic visit but again no significant abdominal swelling, worsening shortness of breath. Uncertain if overdiuresis could have contributed to symptoms on previous hospital admission. Creatinine was elevated on initial admission.

## 2012-05-29 NOTE — Assessment & Plan Note (Signed)
Currently with no symptoms of angina. No further workup at this time. Continue current medication regimen. 

## 2012-05-29 NOTE — Assessment & Plan Note (Signed)
Determining his fluid status is very challenging. He has chronic lower extremity edema, worse over the past several weeks while at [facility]. Currently with compression hose in place. No worsening shortness of breath as his leg edema has progressed in the past week or so. His family feel this is venous insufficiency. Unable to exclude lymphedema. If the edema gets worse, could consider extra when necessary Lasix, also should consider compression boots for lymphedema.

## 2012-05-29 NOTE — Patient Instructions (Addendum)
Please start multaq 400 mg twice a day with food Continue other medications  Please call us if you have new issues that need to be addressed before your next appt.  Your physician wants you to follow-up in: 1 months.

## 2012-05-30 ENCOUNTER — Encounter: Payer: Self-pay | Admitting: Cardiovascular Disease

## 2012-06-10 ENCOUNTER — Encounter: Payer: Self-pay | Admitting: Internal Medicine

## 2012-06-12 ENCOUNTER — Encounter: Payer: Self-pay | Admitting: Cardiovascular Disease

## 2012-06-12 ENCOUNTER — Ambulatory Visit (INDEPENDENT_AMBULATORY_CARE_PROVIDER_SITE_OTHER): Payer: PRIVATE HEALTH INSURANCE | Admitting: Cardiovascular Disease

## 2012-06-12 VITALS — BP 110/60 | HR 108 | Ht 71.0 in | Wt 202.8 lb

## 2012-06-12 DIAGNOSIS — R609 Edema, unspecified: Secondary | ICD-10-CM

## 2012-06-12 DIAGNOSIS — R0602 Shortness of breath: Secondary | ICD-10-CM

## 2012-06-12 DIAGNOSIS — I2510 Atherosclerotic heart disease of native coronary artery without angina pectoris: Secondary | ICD-10-CM

## 2012-06-12 DIAGNOSIS — E785 Hyperlipidemia, unspecified: Secondary | ICD-10-CM

## 2012-06-12 DIAGNOSIS — I4891 Unspecified atrial fibrillation: Secondary | ICD-10-CM

## 2012-06-12 DIAGNOSIS — I509 Heart failure, unspecified: Secondary | ICD-10-CM

## 2012-06-12 DIAGNOSIS — I5031 Acute diastolic (congestive) heart failure: Secondary | ICD-10-CM

## 2012-06-12 DIAGNOSIS — I10 Essential (primary) hypertension: Secondary | ICD-10-CM

## 2012-06-12 LAB — BASIC METABOLIC PANEL
Anion Gap: 2 — ABNORMAL LOW (ref 7–16)
BUN: 17 mg/dL (ref 7–18)
Calcium, Total: 8.6 mg/dL (ref 8.5–10.1)
Chloride: 91 mmol/L — ABNORMAL LOW (ref 98–107)
Co2: 39 mmol/L — ABNORMAL HIGH (ref 21–32)
Creatinine: 1.36 mg/dL — ABNORMAL HIGH (ref 0.60–1.30)
EGFR (African American): >60
EGFR (Non-African Amer.): 52 — ABNORMAL LOW
Glucose: 74 mg/dL (ref 65–99)
Osmolality: 265 (ref 275–301)
Potassium: 3.8 mmol/L (ref 3.5–5.1)
Sodium: 132 mmol/L — ABNORMAL LOW (ref 136–145)

## 2012-06-12 MED ORDER — TORSEMIDE 20 MG PO TABS: 40.0000 mg | ORAL_TABLET | Freq: Two times a day (BID) | ORAL | Status: DC

## 2012-06-12 NOTE — Progress Notes (Signed)
Patient ID: Christian [name], male    DOB: 1941/06/12, 71 y.o.   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   h/o profound esophagitis, weight loss of more than 50 pounds, anorexia, lack of interest, food getting stuck in his throat, difficulty swallowing dry mouth, periods of hallucinations and memory problems.  He presented to the hospital at [facility] September 02 2011 following an EGD that showed severe esophagitis, moderate gastritis,. He had mental status changes, shortness of breath, elevated cardiac enzymes. Blood gases showed hypercapnia and respiratory depression and he required BiPAP. It was initially felt his respiratory issues were secondary to sedation at the time of his EGD though he required respiratory support for several days. He had a long hospital course, long period in rehabilitation with good recovery.    cardioversion  for atrial fibrillation which was successful though in followup he converted back to atrial fibrillation. He is asymptomatic in June 2013 when seen in clinic while in atrial fibrillation . Baseline weight seems to fluctuate between 175 and 190 pounds.  Recent hospital admission mid-October for similar symptoms of failure to thrive, leg weakness, mental status changes, hypoxia. Significant neurologic workup was essentially unrevealing. In the end, he was started on lactulose for question of encephalopathy and high ammonia level. He is currently at [facility] in rehabilitation.   Since his last clinic visit, his weight is up significantly, 10 pounds. His Edema of the lower extremities has been getting worse. He is  wearing compression hose. He denies drinking significant fluids at [facility].  His family is disappointed that he has gained so much weight without any call to our office from [place]. We are not receiving any weight checks or vitals. He was started on multaq 400 mg twice a day at his last clinic visit in addition to his other medications for atrial fibrillation rate control. We'll try to avoid amiodarone given problems in the past with possible encephalopathy  Previous  Echocardiogram showed normal LV systolic function greater than 55%, borderline elevated right ventricular systolic pressures pulmonic aortic valve stenosis.   EKG : Atrial fibrillation with ventricular rate 108 beats per minute, nonspecific ST abnormality    Outpatient Encounter Prescriptions as of 06/12/2012  Medication Sig Dispense Refill  . acetaminophen (TYLENOL) 500 MG tablet Take 500 mg by mouth every 6 (six) hours as needed.      . budesonide (PULMICORT) 0.5 MG/2ML nebulizer solution Take 0.5 mg by nebulization 2 (two) times daily.      . cetirizine (ZYRTEC) 10 MG tablet Take 10 mg by mouth daily.        . diazepam (VALIUM) 2 MG tablet Take 2 mg by mouth every 4 (four) hours as needed.      . dronedarone (MULTAQ) 400 MG tablet Take 400 mg by mouth 2 (two) times daily.      Marland Kitchen ipratropium-albuterol (DUONEB) 0.5-2.5 (3) MG/3ML SOLN Take 3 mLs by nebulization 4 (four) times daily - after meals and at bedtime.      Marland Kitchen lactulose, encephalopathy, (CHRONULAC) 10 GM/15ML SOLN Take 30 g by mouth 3 (three) times daily.       . metoprolol tartrate (LOPRESSOR) 25 MG tablet  Take 25 mg by mouth 2 (two) times daily.      . Multiple Vitamin (MULTIVITAMIN) tablet Take 1 tablet by mouth daily.      . nitroGLYCERIN (NITROSTAT) 0.4 MG SL tablet Place 0.4 mg under the tongue every 5 (five) minutes as needed.        . NON FORMULARY Oxygen @@ 2 liters       . omeprazole (PRILOSEC) 20 MG capsule Take 20 mg by mouth daily.       Marland Kitchen spironolactone  (ALDACTONE) 25 MG tablet Take 25 mg by mouth daily.      . Tamsulosin HCl (FLOMAX) 0.4 MG CAPS Take 1 capsule (0.4 mg total) by mouth daily after supper.  90 capsule  3  .  furosemide (LASIX) 40 MG tablet Take 40 mg by mouth daily.        Review of Systems  HENT: Negative.   Eyes: Negative.   Cardiovascular: Positive for leg swelling.  Gastrointestinal: Negative.   Skin: Negative.   Neurological: Negative.   Hematological: Negative.   Psychiatric/Behavioral: Positive for dysphoric mood and decreased concentration.  All other systems reviewed and are negative.    BP 110/60  Pulse 108  Ht 5\' 11"  (1.803 m)  Wt 202 lb 12 oz (91.967 kg)  BMI 28.28 kg/m2  Physical Exam  Nursing note and vitals reviewed. Constitutional: He is oriented to person, place, and time. He appears well-developed and well-nourished.  HENT:  Head: Normocephalic.  Nose: Nose normal.  Mouth/Throat: Oropharynx is clear and moist.  Eyes: Conjunctivae normal are normal. Pupils are equal, round, and reactive to light.  Neck: Normal range of motion. Neck supple. No JVD present.  Cardiovascular: Normal rate, S1 normal, S2 normal and intact distal pulses.  An irregularly irregular rhythm present. Exam reveals no gallop and no friction rub.   Murmur heard.  Crescendo systolic murmur is present with a grade of 2/6       2+ nonpitting edema bilaterally, compression hose in place  Pulmonary/Chest: Effort normal and breath sounds normal. No respiratory distress. He has no wheezes. He has no rales. He exhibits no tenderness.  Abdominal: Soft. Bowel sounds are normal. He exhibits no distension. There is no tenderness.  Musculoskeletal: Normal range of motion. He exhibits no edema and no tenderness.  Lymphadenopathy:    He has no cervical adenopathy.  Neurological: He is alert and oriented to person, place, and time. Coordination normal.  Skin: Skin is warm and dry. No rash noted. No erythema.  Psychiatric: He has a normal  mood and affect. His behavior is normal. Judgment and thought content normal.           Assessment and Plan

## 2012-06-12 NOTE — Assessment & Plan Note (Signed)
Continue aggressive lipid management. 

## 2012-06-12 NOTE — Assessment & Plan Note (Addendum)
Worsening heart failure over the past several weeks. Weight gain, worsening edema. He denies excess fluid intake. We'll change his Lasix to 40 mg daily to torsemide 40 mg twice a day for 3 days then down to 40 mg daily. He has appointment with Dr. Para March in 6 days. If weight has not started to decline, would continue torsemide 40 mg twice a day for several more days. We will see him in 2 weeks' time. If weight continues to increase, we would add metolazone 5 mg , 30 minutes before a.m. torsemide

## 2012-06-12 NOTE — Assessment & Plan Note (Signed)
Edema is worse, weight gain of 10 pounds in 2 weeks. Will increase his diuretic regimen.

## 2012-06-12 NOTE — Assessment & Plan Note (Signed)
Pressure stable. No medication changes made apart from diuretics.

## 2012-06-12 NOTE — Assessment & Plan Note (Signed)
Currently with no symptoms of angina. No further workup at this time. Continue current medication regimen. 

## 2012-06-12 NOTE — Assessment & Plan Note (Signed)
Recheck of his heart rate on exam did show some improvement from initial EKG. Rate likely in the 80s. No medication changes made apart from starting warfarin 5 mg daily with 2.5 mg Monday and Friday. Possible INR check next week with Dr. Para March or in our office. We will arrange home nursing if needed for INR checks or have him come to the office so he can be evaluated with weight check on a weekly basis.

## 2012-06-12 NOTE — Patient Instructions (Addendum)
Please hold the lasix Please start torsemide 40 mg twice a day for three days, then decrease dose to torsemide 40 mg daily Please start kcl 20 meq x 3 days, then decrease dose to 10 meq daily Daily weights. Please fax on Dec 6th  to [phone], Selena Batten For any additional weight gain of more than 2 pounds, call the office Start warfarin 5 mg daily, 2.5 mg on Monday and Friday Dr. York Ram can check INR on monday  APPT with Dr. York Ram at 3:30 on Monday Dec 9TH  We will order a heart failure nurse   Please call us if you have new issues that need to be addressed before your next appt.  Your physician wants you to follow-up in:  2 weeks

## 2012-06-13 ENCOUNTER — Telehealth: Payer: Self-pay

## 2012-06-13 NOTE — Telephone Encounter (Signed)
The patient is still at [facility] and will remain there until Dec. 13, 2013. The daughter states patient is down 5 lbs since yesterday. Please advise if need to do anything different with medications. Contact the daughter to explain.

## 2012-06-13 NOTE — Telephone Encounter (Signed)
I received t/c from [facility] CHF representative He says he does not think he is able to go into [facility], where pt resides, but he is going to confirm and call me back

## 2012-06-13 NOTE — Addendum Note (Signed)
Addended by: Sabino Snipes E on: 06/13/2012 08:52 AM   Modules accepted: Orders

## 2012-06-13 NOTE — Telephone Encounter (Signed)
[place] called back, says they cannot go into SNF at [facility] for CHF nursing care Says we could contact their SW at [facility] to get more info and explain what we expect Her name is Cala Bradford. She can be reached at [phone]

## 2012-06-14 ENCOUNTER — Telehealth: Payer: Self-pay | Admitting: Cardiovascular Disease

## 2012-06-14 LAB — PROTIME-INR
INR: 1.2
Prothrombin Time: 15.1 secs — ABNORMAL HIGH (ref 11.5–14.7)

## 2012-06-14 NOTE — Telephone Encounter (Signed)
dtr called back i explained to her we would be assessing weights that will be faxed tomm and we will be back in touch with her as well as [facility] nurse [id number] loss Understanding verb

## 2012-06-14 NOTE — Telephone Encounter (Signed)
LMTCB

## 2012-06-14 NOTE — Telephone Encounter (Signed)
Patient's daugther called and wanted to speak with Jasmine December, she stated that since the visit patient has lost a total of 7 pounds

## 2012-06-14 NOTE — Telephone Encounter (Signed)
I received t/c from nurse at [facility] re: pt. Says pt was started on warfarin by Dr. Mariah Milling 12/3. Says pt started warfarin and had INR checked.  MD at [facility] wants Dr. Mariah Milling to "sign off" on warfarin usage before continuing to manage at [facility].  Nurse is asking for our fax # to fax this to.  This was given to her.  We will await fax  She also wants to make [place] aware of the fact that pt has lost 7 pounds in 3 days. They will be faxing a complete log of weights tomm. To our fax #. This number was confirmed again with her.   I will also make Dr. Mariah Milling aware of 7 pound weight loss.

## 2012-06-14 NOTE — Telephone Encounter (Signed)
FYI [facility] says CHF nurse cannot go in to SNF They have to wait for them to come home/go into assisted living facility Only way they can go into SNF for CHF is if they are administering IV lasix/protocol

## 2012-06-14 NOTE — Telephone Encounter (Signed)
FYI 7 pound weight loss in 3 days Faxing rest of weights tomm

## 2012-06-14 NOTE — Telephone Encounter (Signed)
Try to contact daughter regarding message. Can you please review and call the daughter regarding the weight loss. The patient was just here to see Dr. Mariah Milling and his fluid medications were changed.

## 2012-06-15 ENCOUNTER — Telehealth: Payer: Self-pay

## 2012-06-15 NOTE — Telephone Encounter (Signed)
I spoke with nurse taking care of pt, Reita Cliche He was informed of plan Understanding verb Says BPs are a little low (96/52) He will continue to monitor these and feels these should improve with new torsemide dose He will call us over w/e if needed

## 2012-06-15 NOTE — Telephone Encounter (Signed)
Weights received from [facility] 12/1=198.9 12/2=199 12/3=196.8 12/4=193.4 12/5=189.8 12/6=185.4 "Have pt go ahead and decrease torsemide to 40 mg daily. Continue to monitor weights daily and call us next week with results" VO Dr. Alvis Lemmings, RN

## 2012-06-15 NOTE — Telephone Encounter (Signed)
Pt's dtr, Mrs. Creta Levin, informed of plan She gave me # to nurse's station at [facility] [phone]

## 2012-06-18 ENCOUNTER — Ambulatory Visit (INDEPENDENT_AMBULATORY_CARE_PROVIDER_SITE_OTHER): Payer: PRIVATE HEALTH INSURANCE | Admitting: Family Medicine

## 2012-06-18 ENCOUNTER — Encounter: Payer: Self-pay | Admitting: Family Medicine

## 2012-06-18 VITALS — BP 108/70 | HR 114 | Temp 97.4°F | Wt 188.0 lb

## 2012-06-18 DIAGNOSIS — K7290 Hepatic failure, unspecified without coma: Secondary | ICD-10-CM

## 2012-06-18 DIAGNOSIS — I4891 Unspecified atrial fibrillation: Secondary | ICD-10-CM

## 2012-06-18 DIAGNOSIS — R0602 Shortness of breath: Secondary | ICD-10-CM

## 2012-06-18 DIAGNOSIS — I509 Heart failure, unspecified: Secondary | ICD-10-CM

## 2012-06-18 NOTE — Patient Instructions (Signed)
I'll be in touch with Dr. Mariah Milling.   Don't change your meds for now.  I'll work on your forms.  Take care.

## 2012-06-19 ENCOUNTER — Encounter: Payer: Self-pay | Admitting: Family Medicine

## 2012-06-19 ENCOUNTER — Telehealth: Payer: Self-pay | Admitting: Internal Medicine

## 2012-06-19 ENCOUNTER — Telehealth: Payer: Self-pay | Admitting: Family Medicine

## 2012-06-19 DIAGNOSIS — D649 Anemia, unspecified: Secondary | ICD-10-CM | POA: Insufficient documentation

## 2012-06-19 LAB — COMPREHENSIVE METABOLIC PANEL
Albumin: 3.5 g/dL (ref 3.5–5.2)
BUN: 24 mg/dL — ABNORMAL HIGH (ref 6–23)
Calcium: 8.8 mg/dL (ref 8.4–10.5)
Chloride: 84 mEq/L — ABNORMAL LOW (ref 96–112)
Glucose, Bld: 93 mg/dL (ref 70–99)
Potassium: 4.5 mEq/L (ref 3.5–5.1)
Total Protein: 6.3 g/dL (ref 6.0–8.3)

## 2012-06-19 LAB — CBC WITH DIFFERENTIAL/PLATELET
Eosinophils Relative: 2.6 % (ref 0.0–5.0)
HCT: 27.7 % — ABNORMAL LOW (ref 39.0–52.0)
Hemoglobin: 8.2 g/dL — ABNORMAL LOW (ref 13.0–17.0)
Lymphs Abs: 0.5 10*3/uL — ABNORMAL LOW (ref 0.7–4.0)
Monocytes Relative: 14.4 % — ABNORMAL HIGH (ref 3.0–12.0)
Platelets: 234 10*3/uL (ref 150.0–400.0)
WBC: 4.3 10*3/uL — ABNORMAL LOW (ref 4.5–10.5)

## 2012-06-19 MED ORDER — TORSEMIDE 20 MG PO TABS: 20.0000 mg | ORAL_TABLET | Freq: Every day | ORAL | Status: DC

## 2012-06-19 MED ORDER — POTASSIUM CHLORIDE CRYS ER 10 MEQ PO TBCR: 10.0000 meq | EXTENDED_RELEASE_TABLET | ORAL | Status: DC

## 2012-06-19 NOTE — Telephone Encounter (Signed)
Amil Amen with [facility];Pt being discharged from [facility] on 06/21/12 and Christian [name] has recommended home health with nursing, PT,OT, social worker and home health aide for bathing and dressing. Need verbal order from Dr Para March OK with services.

## 2012-06-19 NOTE — Telephone Encounter (Signed)
Call pt's family.   LFTs are fine.  Hgb is lower than prev.   Kidney function is okay for now.   I talked with Dr. Mariah Milling- he agrees with the plan.   Cut back on torsemide, take 20mg  once a day.   Will need to take of KDur every other day.  Recheck CBC and iron panel on Thursday, see if it can be drawn at facility and if so give order for CBC and iron panel, dx anemia 285.9.  If not, we need to draw here.  If order needed in epic, notify me.  Stop warfarin for now.  Would not transfuse now, but this may be needed in the future.  Notify us if any bleeding, black or bloody stools.   Would refer to heme due to recurrent anemia.  Referral is in.  Would continue with GI referral, when possible.   Dr. Mariah Milling can recheck labs again at his visit next week.

## 2012-06-19 NOTE — Progress Notes (Signed)
Recent summary per Dr. 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   H/o profound esophagitis, weight loss of more than 50 pounds, anorexia, lack of interest, food getting stuck in his throat, difficulty swallowing dry mouth, periods of hallucinations and memory problems.   He presented to the hospital at [facility] September 02 2011 following an EGD that showed severe esophagitis, moderate gastritis,.   He had mental status changes, shortness of breath, elevated cardiac enzymes.   Blood gases showed hypercapnia and respiratory depression and he required BiPAP. It was initially felt his respiratory issues were secondary to sedation at the time of his EGD though he required respiratory support for several days. He had a long hospital course, long period in rehabilitation with good recovery.  cardioversion for atrial fibrillation which was successful though in followup he converted back to atrial fibrillation. He is asymptomatic in June 2013 when seen in clinic while in atrial fibrillation . Baseline weight seems to fluctuate between 175 and 190 pounds.   Recent hospital admission mid-October for similar symptoms of failure to thrive, leg weakness, mental status changes, hypoxia. Significant neurologic workup was essentially unrevealing. In the end, he was started on lactulose for question of encephalopathy and high ammonia level. He is currently at [facility] in rehabilitation.   Since his last clinic visit, his weight is up significantly, 10 pounds. His Edema of the lower extremities has been getting worse. He is wearing compression hose. He denies drinking significant  fluids at [facility]. His family is disappointed that he has gained so much weight without any call to our office from [place]. We are not receiving any weight checks or vitals. He was started on multaq 400 mg twice a day at his last clinic visit in addition to his other medications for atrial fibrillation rate control. We'll try to avoid amiodarone given problems in the past with possible encephalopathy   Previous Echocardiogram showed normal LV systolic function greater than 55%, borderline elevated right ventricular systolic pressures pulmonic aortic valve stenosis.  In interval since Dr. Windell Hummingbird visit, he was increased on torsemide for 3 days, now back to once daily dosing.  Weight is down 14 lbs.  He is making progress with rehab in terms of muscle strength in BLE, now more able to get out of chair w/o assistance.  Still on O2.  Continue on lactulose for prev encephalopathy symptoms with clear mentation.  We discussed GI eval.  LFTs are pending.  He does have frequent flatus.  He is restarted on coumadin for AF.  No bleeding known.    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  This was discussed today.  His breathing is improved with diuresis.    He still at [facility] with planned discharge to his sister's home in the near future.    PMH and SH reviewed  ROS: See HPI, otherwise noncontributory.  Meds, vitals, and allergies reviewed.   Nad= recheck pulse 96 and pulse ox 96% ncat On O2 Mmm IRR Ctab, no wheeze abd soft Ext in compression stockings.  Faint B hand tremor, improved per family report Mentation at baseline, alert and normal conversation.

## 2012-06-19 NOTE — Telephone Encounter (Signed)
Dr Rhea Belton, the notes are in your in box for review. The note states Dr Dareen Piano wants to consult with you about pt starting Coumadin as prescribed by the Cardiologist, Dr Mariah Milling. Thanks.

## 2012-06-19 NOTE — Telephone Encounter (Signed)
Agreed, please give verbal order and see if they can do the lab draw listed below.

## 2012-06-19 NOTE — Assessment & Plan Note (Signed)
Likely multifactorial.  Much improved with diuresis and will continue inhalers for now.  We can consider tapering these in the future.  I am unclear how much liver disease affects his fluid status and would like GI input.  See notes on labs from today.    I will discuss the case with Dr. Mariah Milling.  In meantime, will continue as is with coumadin with planned recheck at [facility] next week.  He'll see me the week after xmas 2013.  Pt and family agreed with plan.  >45 min spent with face to face with patient and family discussing recent history and plan, >50% counseling and/or coordinating care.

## 2012-06-19 NOTE — Telephone Encounter (Signed)
Patient seen in 2012 and diagnosed with esophagitis, efforts to reach the patient thereafter failed. Please ensure that family is okay with followup with our practice, and if so he can be scheduled

## 2012-06-19 NOTE — Telephone Encounter (Signed)
LMOVM of Christian [name] at [facility].  Not sure if they can do lab draw or not but patient's daughter says that if all of this cannot be set up by Thursday, she will call and bring him here for the labs.  Patient's daughter was advised of the overall plan and expressed understanding.  She understands that Shirlee Limerick will be calling about the Hematology referral and asks if the GI referral is in also?

## 2012-06-20 ENCOUNTER — Ambulatory Visit: Payer: Self-pay | Admitting: Internal Medicine

## 2012-06-20 NOTE — Telephone Encounter (Signed)
Yes, already in. Routed note to LF and MK.

## 2012-06-20 NOTE — Telephone Encounter (Signed)
Message received from [place], she will not be able to draw patient's labs tomorrow.  Called and spoke with daughter, she said she is going to [facility] this afternoon for a meeting and will call back tomorrow morning to schedule lab appt.-  CBC, iron panel- 285.9.

## 2012-06-20 NOTE — Telephone Encounter (Signed)
Spoke with Orvan July, RN at [facility] to inform him Dr Rhea Belton would be glad to see the pt, but he never followed up with [place] and we aren't sure if he wants to continue seeing [place]. Reita Cliche stated he has spoken with the family and the daughter requested Dr Rhea Belton. Reita Cliche states there will be a Discharge Planning meeting today and pt will probably go home tomorrow. I will call him back.  Called Bobby back to inform him Dr Lianne Bushy ofc already called and pt scheduled for 06/27/12 at 3pm.

## 2012-06-21 ENCOUNTER — Encounter: Payer: Self-pay | Admitting: *Deleted

## 2012-06-21 ENCOUNTER — Other Ambulatory Visit (INDEPENDENT_AMBULATORY_CARE_PROVIDER_SITE_OTHER): Payer: PRIVATE HEALTH INSURANCE

## 2012-06-21 ENCOUNTER — Telehealth: Payer: Self-pay

## 2012-06-21 DIAGNOSIS — D649 Anemia, unspecified: Secondary | ICD-10-CM

## 2012-06-21 DIAGNOSIS — I10 Essential (primary) hypertension: Secondary | ICD-10-CM

## 2012-06-21 LAB — IBC PANEL: Iron: 20 ug/dL — ABNORMAL LOW (ref 42–165)

## 2012-06-21 LAB — CBC WITH DIFFERENTIAL/PLATELET
Basophils Relative: 0 % (ref 0.0–3.0)
Eosinophils Relative: 1.9 % (ref 0.0–5.0)
HCT: 27.3 % — ABNORMAL LOW (ref 39.0–52.0)
Hemoglobin: 8.3 g/dL — ABNORMAL LOW (ref 13.0–17.0)
Lymphs Abs: 0.6 10*3/uL — ABNORMAL LOW (ref 0.7–4.0)
MCV: 71.5 fl — ABNORMAL LOW (ref 78.0–100.0)
Monocytes Absolute: 0.6 10*3/uL (ref 0.1–1.0)
Neutro Abs: 2.7 10*3/uL (ref 1.4–7.7)
Platelets: 226 10*3/uL (ref 150.0–400.0)
WBC: 4 10*3/uL — ABNORMAL LOW (ref 4.5–10.5)

## 2012-06-21 NOTE — Addendum Note (Signed)
Addended by: Alvina Chou on: 06/21/2012 11:42 AM   Modules accepted: Orders

## 2012-06-21 NOTE — Telephone Encounter (Signed)
Pharmacy called to say pt is having Multaq refilled and pt is in donut hole therefore med is going to cost pt >$300/month until January He asks if we can switch him back to amiodarone I explained, per Dr. Windell Hummingbird last note, he should remain off amiodarone d/t hx of encephalopathy I told pharmacist we would leave samples of Multaq at [facility] for family to pick up to last pt until January when he comes out of donut hole Understanding verb.

## 2012-06-22 ENCOUNTER — Ambulatory Visit: Payer: Self-pay | Admitting: Internal Medicine

## 2012-06-22 ENCOUNTER — Telehealth: Payer: Self-pay

## 2012-06-22 LAB — CBC CANCER CENTER
Basophil: 2 %
Eosinophil: 3 %
HCT: 29 % — ABNORMAL LOW (ref 40.0–52.0)
HGB: 8.9 g/dL — ABNORMAL LOW (ref 13.0–18.0)
Lymphocytes: 16 %
MCH: 21.7 pg — ABNORMAL LOW (ref 26.0–34.0)
MCHC: 30.5 g/dL — ABNORMAL LOW (ref 32.0–36.0)
MCV: 71 fL — ABNORMAL LOW (ref 80–100)
Monocytes: 12 %
Platelet: 221 x10 3/mm (ref 150–440)
RBC: 4.09 10*6/uL — ABNORMAL LOW (ref 4.40–5.90)
WBC: 4.1 x10 3/mm (ref 3.8–10.6)

## 2012-06-22 LAB — RETICULOCYTES: Absolute Retic Count: 0.0437 10*6/uL (ref 0.031–0.129)

## 2012-06-22 LAB — IRON AND TIBC
Iron Saturation: 5 %
Iron: 23 ug/dL — ABNORMAL LOW (ref 65–175)
Unbound Iron-Bind.Cap.: 445 ug/dL

## 2012-06-22 NOTE — Telephone Encounter (Signed)
Discussed spironolactone with Dr. Mariah Milling. He suggests pt continue taking as previously prescribed, at least until he sees [place] next week.   \Dtr was informed Understanding verb Will pick up samples today

## 2012-06-22 NOTE — Telephone Encounter (Signed)
Located samples Will call dtr to inform

## 2012-06-22 NOTE — Telephone Encounter (Signed)
Pt's dtr called to say she was told by pharmacist to pick up samples of multaq from [place] I explained this is true but we are completely out of multaq samples and have had to order these online yesterday Should be here by next week She explains pt has taken last tablet today and is completely out Does not have the $ to be able to get refill from pharmacy (in donut hole, >$300) I had her hold while I checked with [place] office. They do not have any samples either. I also checked with pt's PCP. They do not have any samples I told dtr I would have to discuss with Dr. Mariah Milling and call her back at [phone] She also questions spironolactone. Says pt was given this daily at rehab but was unsure if he is to continue this at home  I told her I would check with Dr. Mariah Milling and let her know

## 2012-06-22 NOTE — Telephone Encounter (Signed)
Amil Amen with [facility] left v/m that pt discharged from [facility] 06/21/12. [facility] services will begin on 06/24/12. No call back necessary.

## 2012-06-24 NOTE — Telephone Encounter (Signed)
Thanks

## 2012-06-26 ENCOUNTER — Encounter: Payer: Self-pay | Admitting: Internal Medicine

## 2012-06-27 ENCOUNTER — Ambulatory Visit (INDEPENDENT_AMBULATORY_CARE_PROVIDER_SITE_OTHER): Payer: PRIVATE HEALTH INSURANCE | Admitting: Internal Medicine

## 2012-06-27 ENCOUNTER — Encounter: Payer: Self-pay | Admitting: Internal Medicine

## 2012-06-27 VITALS — BP 84/52 | HR 72 | Ht 71.0 in | Wt 182.6 lb

## 2012-06-27 DIAGNOSIS — K769 Liver disease, unspecified: Secondary | ICD-10-CM

## 2012-06-27 DIAGNOSIS — D509 Iron deficiency anemia, unspecified: Secondary | ICD-10-CM

## 2012-06-27 DIAGNOSIS — R143 Flatulence: Secondary | ICD-10-CM

## 2012-06-27 DIAGNOSIS — R140 Abdominal distension (gaseous): Secondary | ICD-10-CM

## 2012-06-27 DIAGNOSIS — K219 Gastro-esophageal reflux disease without esophagitis: Secondary | ICD-10-CM

## 2012-06-27 DIAGNOSIS — J449 Chronic obstructive pulmonary disease, unspecified: Secondary | ICD-10-CM

## 2012-06-27 DIAGNOSIS — R141 Gas pain: Secondary | ICD-10-CM

## 2012-06-27 DIAGNOSIS — K5900 Constipation, unspecified: Secondary | ICD-10-CM

## 2012-06-27 DIAGNOSIS — G9340 Encephalopathy, unspecified: Secondary | ICD-10-CM

## 2012-06-27 MED ORDER — TAMSULOSIN HCL 0.4 MG PO CAPS: 0.4000 mg | ORAL_CAPSULE | Freq: Every day | ORAL | Status: DC

## 2012-06-27 MED ORDER — POLYETHYLENE GLYCOL 3350 17 GM/SCOOP PO POWD: 17.0000 g | Freq: Every day | ORAL | Status: DC

## 2012-06-27 NOTE — Patient Instructions (Addendum)
Start taking Miralax twice a day.  Continue taking Omeprazole 20 mg daily.     You have been scheduled for an abdominal ultrasound at [facility] (1st floor of hospital) on 07/06/2012 at 9:30am. Please arrive 15 minutes prior to your appointment for registration. Make certain not to have anything to eat or drink 6 hours prior to your appointment. Should you need to reschedule your appointment, please contact radiology at [phone]. This test typically takes about 30 minutes to perform.  It is ok to start taking Aspirin     Follow up with Dr. Rhea Belton in office in 6 weeks

## 2012-06-27 NOTE — Progress Notes (Signed)
Patient ID: Christian [name], male   DOB: Jul 07, 1941, 71 y.o.   MRN: [id number]  SUBJECTIVE: HPI Mr. Matranga is a 71 year old male with a complicated past medical history including but not limited to COPD on home oxygen with several admissions over the last year for acute on chronic respiratory failure, CHF, obstructive sleep apnea, atrial fibrillation, GERD with esophagitis, BPH, anemia, and hyperglycemia who is seen in followup. He is accompanied today by his son and daughter. He was last seen by me at the time of his upper endoscopy in February 2013. This study revealed severe reflux esophagitis, small hiatal hernia and moderate gastritis. Approximately 24 hours after this exam he was admitted with volume overload and acute on chronic respiratory failure. This led to a prolonged hospitalization and rehabilitation stay after discharge. He was readmitted to the hospital with acute on chronic respiratory failure with CHF exacerbation in October from 14-23, 2013.  He again recovered in a rehabilitation facility after discharge. This is a complicated hospitalization including hematuria requiring blood transfusion, altered mental status of unclear etiology. During this hospitalization appears an ammonia level was checked and the diagnosis of hepatic encephalopathy was raised. He was having constipation during his hospitalization and eventually was started on lactulose which was titrated. Lactulose helped with his constipation, and over time his mental status improved and thus he was discharged on lactulose. Also he became progressively anemic and the decision was made to stop his anticoagulation over concern of blood loss. He has remained off aspirin and anticoagulation to this point. Other than hematuria secondary to Foley trauma, he does not recall rectal bleeding, melena, hematochezia, hematemesis.   Today he and his family report that he is doing better. His energy levels are improving. He does complain of  significant lower GI gas and bloating. This is been a new symptom with lactulose initiation. He is having more frequent bowel movements as many as 3-5 times daily. They're usually formed but can be loose and watery. He denies rectal bleeding or melena. He also apparently has not had recent altered mental status. He remains on home oxygen.  He denies heartburn, dysphagia or odynophagia today. No nausea or vomiting. Reportedly appetite fluctuates but he is able to 8 without pain or early satiety.    He has been recently seen by hematology with plans to initiate IV iron therapy  Review of Systems  As per history of present illness, otherwise negative   Past Medical History  Diagnosis Date  . Coronary artery disease   . Diabetes mellitus   . Hyperlipidemia   . Hypertension   . GERD (gastroesophageal reflux disease)   . Arthritis   . Diastolic CHF, chronic   . Kidney stones   . OSA (obstructive sleep apnea)   . Atrial fibrillation     DCCV 04/2010  . CO2 retention     during admission to [facility] 2013  . Acute renal failure   . CPAP (continuous positive airway pressure) dependence   . Hepatic encephalopathy     2013    Current Outpatient Prescriptions  Medication Sig Dispense Refill  . acetaminophen (TYLENOL) 500 MG tablet Take 500 mg by mouth every 6 (six) hours as needed.      . budesonide (PULMICORT) 0.5 MG/2ML nebulizer solution Take 0.5 mg by nebulization 2 (two) times daily.      . cetirizine (ZYRTEC) 10 MG tablet Take 10 mg by mouth daily.        Marland Kitchen dronedarone (MULTAQ) 400 MG tablet  Take 400 mg by mouth 2 (two) times daily.      . metoprolol tartrate (LOPRESSOR) 25 MG tablet Take 25 mg by mouth 2 (two) times daily.      . Multiple Vitamin (MULTIVITAMIN) tablet Take 1 tablet by mouth daily.      . nitroGLYCERIN (NITROSTAT) 0.4 MG SL tablet Place 0.4 mg under the tongue every 5 (five) minutes as needed.        . NON FORMULARY Oxygen @@ 2 liters       . Nutritional Supplements  (FEEDING SUPPLEMENT, GLUCERNA 1.2 CAL,) LIQD Place 1,000 mLs into feeding tube continuous.      Marland Kitchen omeprazole (PRILOSEC) 20 MG capsule Take 20 mg by mouth daily.       . potassium chloride (KLOR-CON M20) 10 MEQ tablet Take 1 tablet (10 mEq total) by mouth every other day.      . spironolactone (ALDACTONE) 25 MG tablet Take 25 mg by mouth daily.      . Tamsulosin HCl (FLOMAX) 0.4 MG CAPS Take 1 capsule (0.4 mg total) by mouth daily after supper.  90 capsule  3  . torsemide (DEMADEX) 20 MG tablet Take 1 tablet (20 mg total) by mouth daily.      . polyethylene glycol powder (GLYCOLAX/MIRALAX) powder Take 17 g by mouth daily.  255 g  3  . [DISCONTINUED] dabigatran (PRADAXA) 150 MG CAPS Take 1 capsule (150 mg total) by mouth every 12 (twelve) hours.  60 capsule  6  . [DISCONTINUED] lisinopril (PRINIVIL,ZESTRIL) 10 MG tablet Take 1 tablet (10 mg total) by mouth daily.  30 tablet  3  . [DISCONTINUED] metFORMIN (GLUCOPHAGE) 500 MG tablet Take 1 tablet (500 mg total) by mouth 2 (two) times daily with a meal. Intolerant of >500mg  per day  180 tablet  3  . [DISCONTINUED] sotalol (BETAPACE) 120 MG tablet Take 1 tablet (120 mg total) by mouth 2 (two) times daily.  60 tablet  6    Allergies  Allergen Reactions  . Crestor (Rosuvastatin Calcium)     myalgias  . Pradaxa (Dabigatran Etexilate Mesylate)     Inflammation in throat.  . Tramadol     Intolerant but not an allergy; sedation    Family History  Problem Relation Age of Onset  . Heart disease Brother   . Arthritis Mother   . Hypertension Mother   . Heart disease Father   . Hyperlipidemia Father   . Stroke Father   . Diabetes Father   . Heart disease Sister   . Aneurysm Mother   . Heart failure Father     History  Substance Use Topics  . Smoking status: Former Smoker -- 1.5 packs/day for 30 years    Types: Cigarettes    Quit date: 07/11/1981  . Smokeless tobacco: Never Used  . Alcohol Use: No    OBJECTIVE: BP 84/52  Pulse 72  Ht  5\' 11"  (1.803 m)  Wt 182 lb 9.6 oz (82.827 kg)  BMI 25.47 kg/m2 Constitutional: Debilitated-appearing male in no acute distress HEENT: Normocephalic and atraumatic.  Conjunctivae are pale. No scleral icterus. Cardiovascular: Irregularly irregular, distant Pulmonary/chest: Distant, somewhat coarse breath sounds without wheezing rales or rhonchi . Abdominal: Soft, nontender, nondistended. Bowel sounds active throughout. There are no masses palpable. No hepatosplenomegaly. Extremities: no clubbing, cyanosis, or trace pretibial edema Neurological: Alert and oriented to person place and time. Skin: Skin is warm and dry. No rashes noted. Psychiatric: Normal mood and affect. Behavior is normal.  Labs  and Imaging -- CBC    Component Value Date/Time   WBC 4.0* 06/21/2012 1142   RBC 3.81* 06/21/2012 1142   HGB 8.3 Repeated and verified X2.* 06/21/2012 1142   HCT 27.3* 06/21/2012 1142   PLT 226.0 06/21/2012 1142   MCV 71.5* 06/21/2012 1142   MCHC 30.4 06/21/2012 1142   RDW 19.4* 06/21/2012 1142   LYMPHSABS 0.6* 06/21/2012 1142   MONOABS 0.6 06/21/2012 1142   EOSABS 0.1 06/21/2012 1142   BASOSABS 0.0 06/21/2012 1142    CMP     Component Value Date/Time   NA 131* 06/18/2012 1657   NA 137 12/12/2011 1539   K 4.5 06/18/2012 1657   CL 84* 06/18/2012 1657   CO2 39* 06/18/2012 1657   GLUCOSE 93 06/18/2012 1657   GLUCOSE 104* 12/12/2011 1539   BUN 24* 06/18/2012 1657   BUN 17 12/12/2011 1539   CREATININE 1.6* 06/18/2012 1657   CALCIUM 8.8 06/18/2012 1657   PROT 6.3 06/18/2012 1657   ALBUMIN 3.5 06/18/2012 1657   AST 21 06/18/2012 1657   ALT 14 06/18/2012 1657   ALKPHOS 72 06/18/2012 1657   BILITOT 0.5 06/18/2012 1657   GFRNONAA 54* 12/12/2011 1539   GFRAA 63 12/12/2011 1539    Iron/TIBC/Ferritin    Component Value Date/Time   IRON 20* 06/21/2012 1142    ASSESSMENT AND PLAN: 71 year old male with a complicated past medical history including but not limited to COPD on home oxygen with several  admissions over the last year for acute on chronic respiratory failure, CHF, obstructive sleep apnea, atrial fibrillation, GERD with esophagitis, BPH, anemia, and hyperglycemia who is seen in followup.  Mr. Stickels has had a very difficult year with multiple hospitalizations and today's visit focused on the question of hepatic encephalopathy, his lower abdominal bloating, and anemia.  1.  ? Hepatic encephalopathy -- an ammonia level was apparently checked during hospitalization and combined with altered mental status he was diagnosed with hepatic encephalopathy. At this point I have very low suspicion for hepatic encephalopathy in this patient given that he has no known history of cirrhosis and no evidence of portal hypertensin (no ascites, thrombocytopenia, hyperbilirubinemia, normal albumin).  We have discussed today how venous ammonia is a very unreliable test it correlates poorly in hepatic encephalopathy.  I think more likely, hypercarbia or hypoxemia as it relates to his COPD, better explains his issues with altered mental status during his hospitalization. At this point it seems that lactulose is causing him more trouble, then help.  I have recommended a liver ultrasound with Doppler to better evaluate the hepatic architecture and blood flow.  I have made recommendations to discontinue lactulose, but I do think he will need another laxative given his history of constipation. I have made the family aware of signs and symptoms of hepatic encephalopathy, and should these occur when lactulose is discontinued, I've asked that they contact us immediately. We also have permission to resume lactulose if they notice an acute change in his mental status.  2.  Constipation/abd bloating/gas -- we have discussed that abdominal bloating and gas is a frequent side effect of lactulose therapy. His constipation is well treated with the lactulose, but he is having unpleasant side effects.  As discussed in #1 above I have  discontinued lactulose. I would like him to start MiraLAX 17 g twice a day. Hopefully this will be enough to treat his constipation, but if not he can add Senokot once to twice daily. If his stools are too  loose with twice a day MiraLAX he can decrease to once daily.  3.  Iron deficiency anemia -- there has been no evidence of overt blood loss recently. He has been seen by hematology, and I feel that he will likely respond very favorably to IV iron. Hopefully this will allow his hemoglobin to improve.  Given his poor toleration of sedation and endoscopy in February, I am not comfortable performing any GI procedures at this time. We have discussed this extensively today and both he and his family agree.  I am okay with resuming aspirin, but recommend waiting on warfarin if at all possible until we can prove that his hemoglobin is improving with iron supplementation. He will have this discussion tomorrow with his cardiologist Dr. Mariah Milling.  He reports having a colonoscopy in 2011 by Dr. Mechele Collin in [place], which was reportedly normal. His family also recalls this being the case, which is very reassuring.  4.  Hx of esophagitis -- severe esophagitis at the time of EGD February 2013 which has symptomatically improved with PPI therapy. At that time he was having dysphagia, which he reports has improved. He'll continue with daily PPI therapy for now.  I will see him back in about 6 weeks to see how he's doing.

## 2012-06-28 ENCOUNTER — Ambulatory Visit (HOSPITAL_COMMUNITY): Payer: PRIVATE HEALTH INSURANCE

## 2012-06-28 ENCOUNTER — Encounter: Payer: Self-pay | Admitting: Cardiovascular Disease

## 2012-06-28 ENCOUNTER — Ambulatory Visit (INDEPENDENT_AMBULATORY_CARE_PROVIDER_SITE_OTHER): Payer: PRIVATE HEALTH INSURANCE | Admitting: Cardiovascular Disease

## 2012-06-28 VITALS — BP 106/62 | HR 92 | Ht 71.0 in | Wt 187.2 lb

## 2012-06-28 DIAGNOSIS — I5031 Acute diastolic (congestive) heart failure: Secondary | ICD-10-CM

## 2012-06-28 DIAGNOSIS — I2510 Atherosclerotic heart disease of native coronary artery without angina pectoris: Secondary | ICD-10-CM

## 2012-06-28 DIAGNOSIS — I509 Heart failure, unspecified: Secondary | ICD-10-CM

## 2012-06-28 DIAGNOSIS — I4891 Unspecified atrial fibrillation: Secondary | ICD-10-CM

## 2012-06-28 DIAGNOSIS — D649 Anemia, unspecified: Secondary | ICD-10-CM

## 2012-06-28 DIAGNOSIS — R609 Edema, unspecified: Secondary | ICD-10-CM

## 2012-06-28 DIAGNOSIS — E872 Acidosis: Secondary | ICD-10-CM

## 2012-06-28 NOTE — Progress Notes (Signed)
Patient ID: Christian [name], male    DOB: 1940-11-01, 71 y.o.   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   h/o profound esophagitis, weight loss of more than 50 pounds, anorexia, lack of interest, food getting stuck in his throat, difficulty swallowing dry mouth, periods of hallucinations and memory problems.  He presented to the hospital at [facility] September 02 2011 following an EGD that showed severe esophagitis, moderate gastritis,. He had mental status changes, shortness of breath, elevated cardiac enzymes. Blood gases showed hypercapnia and respiratory depression and he required BiPAP. It was initially felt his respiratory issues were secondary to sedation at the time of his EGD though he required respiratory support for several days. He had a long hospital course, long period in rehabilitation with good recovery.    cardioversion  for atrial fibrillation which was successful though in followup he converted back to atrial fibrillation. He is asymptomatic in June 2013 when seen in clinic while in atrial fibrillation . Baseline weight seems to fluctuate between 175 and 190 pounds.   hospital admission mid-October for similar symptoms of failure to thrive, leg weakness, mental status changes, hypoxia. Significant neurologic workup was essentially unrevealing. In the end, he was started on lactulose for question of encephalopathy and high ammonia level. Completed rehabilitation at [facility]   On his last clinic visit, weight was up 10 pounds. Worsening lower extremity edema. He was started on multaq 400 mg twice a day at his last clinic visit in  addition to his other medications for atrial fibrillation rate control. Try to avoid amiodarone and digoxin as we're uncertain if these medications contributed to previous hospital admissions.   In return today, he has lost 15 pounds with improved edema, overall feels better with less shortness of breath. He was initially started on torsemide 20 mg twice a day. After 7 pound weight loss over several days, he dose was decreased to 20 mg daily . He has received iron infusion x2 recently. He does feel tired. He has seen GI with recommendation to stop lactulose and start MiraLAX. His weight at home is approximately 178-180 pounds. Warfarin was held after the finding of his low blood count with hematocrit 27, uncertain if there was GI bleed. To be on the safe side, anticoagulation was held in discussion with Dr. Para March. He is taking aspirin 81 mg daily.  Previous  Echocardiogram showed normal LV systolic function greater than 55%, borderline elevated right ventricular systolic pressures pulmonic aortic valve stenosis.   EKG : Atrial fibrillation with ventricular rate 92 beats per minute, nonspecific ST abnormality    Outpatient Encounter Prescriptions as of 06/28/2012  Medication Sig Dispense Refill  . acetaminophen (TYLENOL) 500 MG tablet Take 500 mg by mouth every 6 (six) hours as needed.      . budesonide (PULMICORT) 0.5 MG/2ML nebulizer solution Take 0.5 mg by nebulization 2 (two) times daily.      . cetirizine (ZYRTEC) 10 MG tablet Take 10 mg by mouth daily.        Marland Kitchen dronedarone (MULTAQ) 400 MG tablet Take 400 mg by mouth 2 (two) times daily.      . ferrous gluconate (FERGON) 325  MG tablet Take 325 mg by mouth daily with breakfast.      . metoprolol tartrate (LOPRESSOR) 25 MG tablet Take 25 mg by mouth 2 (two) times daily.      . Multiple Vitamin (MULTIVITAMIN) tablet Take 1 tablet by mouth daily.      . nitroGLYCERIN (NITROSTAT) 0.4 MG SL tablet Place 0.4 mg under the tongue every 5 (five) minutes  as needed.        . NON FORMULARY Oxygen @@ 2 liters       . Nutritional Supplements (FEEDING SUPPLEMENT, GLUCERNA 1.2 CAL,) LIQD Place 1,000 mLs into feeding tube continuous.      Marland Kitchen omeprazole (PRILOSEC) 20 MG capsule Take 20 mg by mouth daily.       . polyethylene glycol powder (GLYCOLAX/MIRALAX) powder Take 17 g by mouth daily.  255 g  3  . potassium chloride (KLOR-CON M20) 10 MEQ tablet Take 1 tablet (10 mEq total) by mouth every other day.      . spironolactone (ALDACTONE) 25 MG tablet Take 25 mg by mouth daily.      . Tamsulosin HCl (FLOMAX) 0.4 MG CAPS Take 1 capsule (0.4 mg total) by mouth daily after supper.  90 capsule  3  . torsemide (DEMADEX) 20 MG tablet Take 1 tablet (20 mg total) by mouth daily.        Review of Systems  Constitutional: Negative.   HENT: Negative.   Eyes: Negative.   Respiratory: Negative.   Cardiovascular: Positive for leg swelling.  Gastrointestinal: Negative.   Musculoskeletal: Negative.   Skin: Negative.   Neurological: Negative.   Hematological: Negative.   Psychiatric/Behavioral: Positive for dysphoric mood and decreased concentration.  All other systems reviewed and are negative.    BP 106/62  Pulse 92  Ht 5\' 11"  (1.803 m)  Wt 187 lb 4 oz (84.936 kg)  BMI 26.12 kg/m2  Physical Exam  Nursing note and vitals reviewed. Constitutional: He is oriented to person, place, and time. He appears well-developed and well-nourished.  HENT:  Head: Normocephalic.  Nose: Nose normal.  Mouth/Throat: Oropharynx is clear and moist.  Eyes: Conjunctivae normal are normal. Pupils are equal, round, and reactive to light.  Neck: Normal range of motion. Neck supple. No JVD present.  Cardiovascular: Normal rate, S1 normal, S2 normal and intact distal pulses.  An irregularly irregular rhythm present. Exam reveals no gallop and no friction rub.   Murmur heard.  Crescendo systolic murmur is present with a grade of 2/6       Trace to 1+ nonpitting edema  bilaterally, compression hose in place  Pulmonary/Chest: Effort normal and breath sounds normal. No respiratory distress. He has no wheezes. He has no rales. He exhibits no tenderness.  Abdominal: Soft. Bowel sounds are normal. He exhibits no distension. There is no tenderness.  Musculoskeletal: Normal range of motion. He exhibits no edema and no tenderness.  Lymphadenopathy:    He has no cervical adenopathy.  Neurological: He is alert and oriented to person, place, and time. Coordination normal.  Skin: Skin is warm and dry. No rash noted. No erythema.  Psychiatric: He has a normal mood and affect. His behavior is normal. Judgment and thought content normal.           Assessment and Plan

## 2012-06-28 NOTE — Assessment & Plan Note (Signed)
Edema has significantly improved on torsemide 20 mg daily. Uncertain what his baseline weight should be. We have suggested he hold torsemide for weight less than 175 pounds at home, take torsemide 20 mg twice a day for weight greater than 185 pounds. He will continue to weight himself daily. We will also see if [facility]" will provide a heart failure nurse

## 2012-06-28 NOTE — Assessment & Plan Note (Signed)
Currently with no symptoms of angina. No further workup at this time. Continue current medication regimen. 

## 2012-06-28 NOTE — Assessment & Plan Note (Signed)
I suspect his CO2 retention is likely the cause of poor inspiratory effort. I suggested to the family that we not been a significant rush to wean the oxygen as they do comment on oxygen saturations frequently in the 80s.

## 2012-06-28 NOTE — Assessment & Plan Note (Addendum)
Appears to have iron deficiency anemia, recent iron infusions x2. One option would be to wait for hematocrit to climb back to a reasonable level and retry anticoagulation for atrial fibrillation. If hematocrit hold steady, less likely GI bleed. He does have moderately dilated left atrium on echocardiogram and certainly at risk of stroke in the future, not to mention history of DVT and sedentary.

## 2012-06-28 NOTE — Patient Instructions (Addendum)
  Please hold the torsemide for weight less than 175 Take torsemide twice a ay for weight greater than 186 (home scale)  Ok to continue aspirin 81 mg x 2  Hold the multaq, watch the heart rate for one week  Please call us if you have new issues that need to be addressed before your next appt.  Your physician wants you to follow-up in: Beginning of Feb. 2014

## 2012-06-28 NOTE — Assessment & Plan Note (Addendum)
Rate is improved on multaq and metoprolol. Unable to advance the metoprolol given low blood pressure. Less excited about restarting digoxin and amiodarone given previous admissions to the hospital for undisclosed etiology (likely hypercapnia). We'll try to use other medications but the multaq is very expensive. We will do a trial week or 2 without medication and monitor the heart rate to see if it is making much difference. Heart rate was previously 109 beats per minute in the setting of heart failure. Fluid status has now improved.

## 2012-06-28 NOTE — Assessment & Plan Note (Signed)
Acute diastolic CHF episode resolved, we'll continue to battle with chronic diastolic CHF. We'll need heart failure nurse which we will try to arrange.

## 2012-07-05 ENCOUNTER — Telehealth: Payer: Self-pay | Admitting: Cardiovascular Disease

## 2012-07-05 NOTE — Telephone Encounter (Signed)
Would continue to monitor blood pressure. Would prefer higher dose of metoprolol if possible as multaq is on hold. He could be getting too dehydrated and this could be contributing to low blood pressure. May need to raise his weight by holding diuretics for several days to see if this helps improve blood pressure.

## 2012-07-05 NOTE — Telephone Encounter (Signed)
FYI See below 

## 2012-07-05 NOTE — Telephone Encounter (Signed)
Pt's daughter called to state her father is having difficulties with the Metroprolol.  He is taking two 25mg  pills a day.  She states he is experiencing dizziness and low blood pressure.  She states it was 75something/50something but not sure.  Please call pt to discuss options.

## 2012-07-05 NOTE — Telephone Encounter (Signed)
Christian [name] says pt has had some dizziness over the past few days. Says BP has also been low (70/50). Says she held last nights' dose of metoprolol and BP is "back to normal". HR is 91-111 (unchanged since stopping multaq). Other than dizzy spells, says pt has increased energy and appetite and is "doing much better". I advised ok to try decreasing metoprolol to 12.5 mg BID and to let us know if this helps. I warned dtr that this may cause HR to become elevated. She should call us if this happens. Understanding  Verb.

## 2012-07-05 NOTE — Telephone Encounter (Signed)
Pt's dtr informed Understanding verb She will have pt remain on metoprolol 1 tablet BID and will hold diuretics x 2 days to see how he does

## 2012-07-06 ENCOUNTER — Ambulatory Visit (HOSPITAL_COMMUNITY)
Admission: RE | Admit: 2012-07-06 | Discharge: 2012-07-06 | Disposition: A | Discharge status: Home / Self Care | Payer: PRIVATE HEALTH INSURANCE | Source: Ambulatory Visit | Attending: Internal Medicine | Admitting: Internal Medicine

## 2012-07-06 DIAGNOSIS — I10 Essential (primary) hypertension: Secondary | ICD-10-CM | POA: Insufficient documentation

## 2012-07-06 DIAGNOSIS — E119 Type 2 diabetes mellitus without complications: Secondary | ICD-10-CM | POA: Insufficient documentation

## 2012-07-06 DIAGNOSIS — K769 Liver disease, unspecified: Secondary | ICD-10-CM | POA: Insufficient documentation

## 2012-07-06 DIAGNOSIS — Q619 Cystic kidney disease, unspecified: Secondary | ICD-10-CM | POA: Insufficient documentation

## 2012-07-06 DIAGNOSIS — K8020 Calculus of gallbladder without cholecystitis without obstruction: Secondary | ICD-10-CM | POA: Insufficient documentation

## 2012-07-09 ENCOUNTER — Ambulatory Visit (INDEPENDENT_AMBULATORY_CARE_PROVIDER_SITE_OTHER): Payer: PRIVATE HEALTH INSURANCE | Admitting: Family Medicine

## 2012-07-09 ENCOUNTER — Encounter: Payer: Self-pay | Admitting: Family Medicine

## 2012-07-09 VITALS — BP 96/62 | HR 115 | Temp 98.0°F | Wt 182.0 lb

## 2012-07-09 DIAGNOSIS — I5032 Chronic diastolic (congestive) heart failure: Secondary | ICD-10-CM

## 2012-07-09 DIAGNOSIS — J9610 Chronic respiratory failure, unspecified whether with hypoxia or hypercapnia: Secondary | ICD-10-CM

## 2012-07-09 DIAGNOSIS — R410 Disorientation, unspecified: Secondary | ICD-10-CM

## 2012-07-09 DIAGNOSIS — D649 Anemia, unspecified: Secondary | ICD-10-CM

## 2012-07-09 DIAGNOSIS — F29 Unspecified psychosis not due to a substance or known physiological condition: Secondary | ICD-10-CM

## 2012-07-09 MED ORDER — GLUCERNA 1.2 CAL PO LIQD: 237.0000 mL | Freq: Every day | ORAL | Status: DC | PRN

## 2012-07-09 NOTE — Patient Instructions (Addendum)
I'll notify Dr. Mariah Milling about your heart rate.  Don't change your meds for now.  Recheck in mid February 2014.  If concerns in the meantime, then notify the office.  Take care.

## 2012-07-10 NOTE — Assessment & Plan Note (Signed)
Per heme with f/u in 1/14 pending.  Feeling much better since starting iron.

## 2012-07-10 NOTE — Assessment & Plan Note (Signed)
Now off O2 and pulmicort and feeling well.

## 2012-07-10 NOTE — Progress Notes (Signed)
Since last OV here- GI eval for possible hepatic disease.  U/s liver unremarkable.  Renal cyst noted, f/u is pending for 6 months.  Off lactulose and on miralax; no constipation and no excess gas now.   Heme eval- now on iron, s/p iron infusion.  No known source for blood loss.  Feeling much better since starting iron.  Due for heme f/u 08/03/12.   CHF.  multaq held (HR ~100-120 at home), has been off spironolactone, K, demadex as his weight had been down on home scales.  Breathing improved, off O2 and pulmicort.  Increase in exercise capacity noted.  He feels much better, and it is hard to overstate the improvement in his mood/mentation/overall conditioning as reported by patient and family.    He's living with his sister and doing well there.    Meds, vitals, and allergies reviewed.   ROS: See HPI.  Otherwise, noncontributory.  Nad, smiles and laughs during the exam ncat IRR, mildly tachy No focal dec in BS, no inc in wob abd soft Ext with 1+ edema

## 2012-07-10 NOTE — Assessment & Plan Note (Signed)
Clear mentation today, A&Ox3, 3/3 recall.

## 2012-07-10 NOTE — Assessment & Plan Note (Addendum)
>  25 min spent with face to face with patient, >50% counseling and/or coordinating care.  Will notify cards about the pulse rate but I didn't change his meds at this point.  He appears much improved and I would continue as is.  He agrees.  We'll plan on seeing him back after he sees Dr. Mariah Milling in 2/14, sooner if needed.  They'll continue to adjust his BP/fluid meds as his weight fluctuates.

## 2012-07-11 ENCOUNTER — Ambulatory Visit: Payer: Self-pay | Admitting: Internal Medicine

## 2012-07-26 DIAGNOSIS — J441 Chronic obstructive pulmonary disease with (acute) exacerbation: Secondary | ICD-10-CM

## 2012-07-26 DIAGNOSIS — L89109 Pressure ulcer of unspecified part of back, unspecified stage: Secondary | ICD-10-CM

## 2012-07-26 DIAGNOSIS — J9620 Acute and chronic respiratory failure, unspecified whether with hypoxia or hypercapnia: Secondary | ICD-10-CM

## 2012-07-30 ENCOUNTER — Other Ambulatory Visit: Payer: Self-pay | Admitting: *Deleted

## 2012-07-30 MED ORDER — OMEPRAZOLE 20 MG PO CPDR: 20.0000 mg | DELAYED_RELEASE_CAPSULE | Freq: Every day | ORAL | Status: DC

## 2012-07-30 NOTE — Telephone Encounter (Signed)
Refilled Omeprazole 

## 2012-08-03 LAB — CANCER CENTER HEMOGLOBIN: HGB: 10.8 g/dL — ABNORMAL LOW (ref 13.0–18.0)

## 2012-08-06 ENCOUNTER — Other Ambulatory Visit: Payer: Self-pay | Admitting: *Deleted

## 2012-08-06 ENCOUNTER — Encounter: Payer: Self-pay | Admitting: Internal Medicine

## 2012-08-06 MED ORDER — SPIRONOLACTONE 25 MG PO TABS: 25.0000 mg | ORAL_TABLET | Freq: Every day | ORAL | Status: DC

## 2012-08-06 NOTE — Telephone Encounter (Signed)
Refilled Spironolactone. 

## 2012-08-11 ENCOUNTER — Ambulatory Visit: Payer: Self-pay | Admitting: Internal Medicine

## 2012-08-13 ENCOUNTER — Telehealth: Payer: Self-pay | Admitting: *Deleted

## 2012-08-13 NOTE — Telephone Encounter (Signed)
Pt daughter calling wants to talk to nurse about pt multaq.

## 2012-08-14 ENCOUNTER — Other Ambulatory Visit: Payer: Self-pay

## 2012-08-14 MED ORDER — DRONEDARONE HCL 400 MG PO TABS: 400.0000 mg | ORAL_TABLET | Freq: Two times a day (BID) | ORAL | Status: DC

## 2012-08-14 NOTE — Telephone Encounter (Signed)
Refill sent for multaq 400 mg

## 2012-08-14 NOTE — Telephone Encounter (Signed)
Patient's daughter states that Mr. Heilman did hold the multaq for one week but did go back on the multaq and has been doing well with his blood pressure staying around 110/60, HR range from 100 to 110. I will send in a new refill for multaq.

## 2012-08-15 NOTE — Telephone Encounter (Signed)
That he is doing well Wonder if we can obtain some samples?

## 2012-08-16 ENCOUNTER — Other Ambulatory Visit: Payer: Self-pay | Admitting: *Deleted

## 2012-08-16 MED ORDER — METOPROLOL TARTRATE 25 MG PO TABS: 25.0000 mg | ORAL_TABLET | Freq: Two times a day (BID) | ORAL | Status: DC

## 2012-08-16 NOTE — Telephone Encounter (Signed)
Refilled Metoprolol Tartrate #60 R#3 sent to [facility].

## 2012-08-17 ENCOUNTER — Encounter: Payer: Self-pay | Admitting: Cardiovascular Disease

## 2012-08-17 ENCOUNTER — Ambulatory Visit (INDEPENDENT_AMBULATORY_CARE_PROVIDER_SITE_OTHER): Payer: PRIVATE HEALTH INSURANCE | Admitting: Cardiovascular Disease

## 2012-08-17 VITALS — BP 112/69 | HR 95 | Ht 71.0 in | Wt 188.5 lb

## 2012-08-17 DIAGNOSIS — I2510 Atherosclerotic heart disease of native coronary artery without angina pectoris: Secondary | ICD-10-CM

## 2012-08-17 DIAGNOSIS — R609 Edema, unspecified: Secondary | ICD-10-CM

## 2012-08-17 DIAGNOSIS — R0602 Shortness of breath: Secondary | ICD-10-CM

## 2012-08-17 DIAGNOSIS — J9610 Chronic respiratory failure, unspecified whether with hypoxia or hypercapnia: Secondary | ICD-10-CM

## 2012-08-17 DIAGNOSIS — I5032 Chronic diastolic (congestive) heart failure: Secondary | ICD-10-CM

## 2012-08-17 DIAGNOSIS — I10 Essential (primary) hypertension: Secondary | ICD-10-CM

## 2012-08-17 DIAGNOSIS — I4891 Unspecified atrial fibrillation: Secondary | ICD-10-CM

## 2012-08-17 NOTE — Progress Notes (Signed)
Patient ID: Christian [name], male    DOB: 05-07-1941, 72 y.o.   MRN: [id number]  HPI Comments: And 33 -year-old gentleman with a history of coronary artery disease, CABG x3 at 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   h/o profound esophagitis, weight loss of more than 50 pounds, anorexia, lack of interest, food getting stuck in his throat, difficulty swallowing dry mouth, periods of hallucinations and memory problems.  hospital at [facility] September 02 2011 following an EGD that showed severe esophagitis, moderate gastritis,.  mental status changes, shortness of breath, elevated cardiac enzymes. Blood gases showed hypercapnia and respiratory depression and he required BiPAP. It was initially felt his respiratory issues were secondary to sedation at the time of his EGD though he required respiratory support for several days.  long hospital course, long  Rehabilitation, good recovery.    cardioversion  for atrial fibrillation which was successful though in followup he converted back to atrial fibrillation. asymptomatic in June 2013 when seen in clinic while in atrial fibrillation .    hospital admission mid-October 2013 for similar symptoms of failure to thrive, leg weakness, mental status changes, hypoxia. Significant neurologic workup was essentially unrevealing.started on lactulose for question of encephalopathy and high ammonia level.  rehabilitation   Previously started on multaq 400 mg twice a day  for atrial fibrillation rate control. Trying to avoid amiodarone and digoxin as we're uncertain if these medications contributed to previous hospital admissions. Rate has been well-controlled but medication is very expensive  Weight has been stable, he  continues to have 2+ pitting edema and takes his torsemide when necessary with stable weight 182 pounds . overall feels better with less shortness of breath.  He has received iron infusion and feels this is helping with his energy, less anemic. Warfarin was held after the finding of his low blood count with hematocrit 27, uncertain if there was GI bleed.  He is taking aspirin 81 mg daily.  Previous  Echocardiogram showed normal LV systolic function greater than 55%, borderline elevated right ventricular systolic pressures pulmonic aortic valve stenosis. and   EKG : Atrial fibrillation with ventricular rate 95 beats per minute, nonspecific ST abnormality    Outpatient Encounter Prescriptions as of 08/17/2012  Medication Sig Dispense Refill  . acetaminophen (TYLENOL) 500 MG tablet Take 500 mg by mouth every 6 (six) hours as needed.      . dronedarone (MULTAQ) 400 MG tablet Take 1 tablet (400 mg total) by mouth 2 (two) times daily with a meal.  60 tablet  6  . ferrous gluconate (FERGON) 325 MG tablet Take 325 mg by mouth daily with breakfast.      . metoprolol tartrate (LOPRESSOR) 25 MG tablet Take 1 tablet (25 mg total) by mouth 2 (two) times daily.  60 tablet  3  . nitroGLYCERIN (NITROSTAT) 0.4 MG SL tablet Place 0.4 mg under the tongue every 5 (five) minutes as needed.        . NON FORMULARY Oxygen @ 2 liters at bedtime.      . Nutritional Supplements (FEEDING SUPPLEMENT, GLUCERNA 1.2 CAL,) LIQD Take 237 mLs by mouth daily as needed.      Marland Kitchen omeprazole (PRILOSEC) 20 MG capsule Take 1 capsule (20 mg total) by mouth daily.  30  capsule  3  . polyethylene glycol powder (GLYCOLAX/MIRALAX) powder Take 17 g by mouth daily.  255 g  3  . potassium chloride (K-DUR,KLOR-CON) 10 MEQ tablet Take 10 mEq by mouth daily as needed.      Marland Kitchen spironolactone (ALDACTONE) 25 MG tablet Take 25 mg by mouth daily as needed.      . Tamsulosin HCl (FLOMAX) 0.4 MG CAPS Take 1 capsule (0.4 mg total) by mouth daily after supper.   90 capsule  3  . torsemide (DEMADEX) 20 MG tablet Take 20 mg by mouth daily as needed.      . [DISCONTINUED] potassium chloride (KLOR-CON M20) 10 MEQ tablet Take 1 tablet (10 mEq total) by mouth every other day.      . [DISCONTINUED] spironolactone (ALDACTONE) 25 MG tablet Take 1 tablet (25 mg total) by mouth daily.  30 tablet  3  . [DISCONTINUED] torsemide (DEMADEX) 20 MG tablet Take 1 tablet (20 mg total) by mouth daily.      . [DISCONTINUED] cetirizine (ZYRTEC) 10 MG tablet Take 10 mg by mouth daily.        . [DISCONTINUED] Multiple Vitamin (MULTIVITAMIN) tablet Take 1 tablet by mouth daily.        Review of Systems  Constitutional: Negative.   HENT: Negative.   Eyes: Negative.   Respiratory: Negative.   Cardiovascular: Positive for leg swelling.  Gastrointestinal: Negative.   Musculoskeletal: Negative.   Skin: Negative.   Neurological: Negative.   Hematological: Negative.   All other systems reviewed and are negative.    BP 112/69  Pulse 95  Ht 5\' 11"  (1.803 m)  Wt 188 lb 8 oz (85.503 kg)  BMI 26.29 kg/m2  Physical Exam  Nursing note and vitals reviewed. Constitutional: He is oriented to person, place, and time. He appears well-developed and well-nourished.  HENT:  Head: Normocephalic.  Nose: Nose normal.  Mouth/Throat: Oropharynx is clear and moist.  Eyes: Conjunctivae normal are normal. Pupils are equal, round, and reactive to light.  Neck: Normal range of motion. Neck supple. No JVD present.  Cardiovascular: Normal rate, S1 normal, S2 normal and intact distal pulses.  An irregularly irregular rhythm present. Exam reveals no gallop and no friction rub.   Murmur heard.  Crescendo systolic murmur is present with a grade of 2/6        1+ to 2+ edema bilaterally, compression hose in place  Pulmonary/Chest: Effort normal and breath sounds normal. No respiratory distress. He has no wheezes. He has no rales. He exhibits no tenderness.  Abdominal: Soft. Bowel sounds are  normal. He exhibits no distension. There is no tenderness.  Musculoskeletal: Normal range of motion. He exhibits no edema and no tenderness.  Lymphadenopathy:    He has no cervical adenopathy.  Neurological: He is alert and oriented to person, place, and time. Coordination normal.  Skin: Skin is warm and dry. No rash noted. No erythema.  Psychiatric: He has a normal mood and affect. His behavior is normal. Judgment and thought content normal.           Assessment and Plan

## 2012-08-17 NOTE — Patient Instructions (Addendum)
You are doing well. No medication changes were made.  Please call us if you have new issues that need to be addressed before your next appt.  Your physician wants you to follow-up in: 6 months.  You will receive a reminder letter in the mail two months in advance. If you don't receive a letter, please call our office to schedule the follow-up appointment.   

## 2012-08-17 NOTE — Assessment & Plan Note (Signed)
Good weight appears to be low 180 pounds. Below that, he has malaise, dizziness, climb in his renal function. I suggested he have his renal function checked again when he sees Dr. Para March. If renal function continues to look dry, he may be better running his weight higher than his current home weight of 182 pounds.

## 2012-08-17 NOTE — Assessment & Plan Note (Signed)
Blood pressure borderline low at times, possibly secondary to overdiuresis. We'll need renal function to check.

## 2012-08-17 NOTE — Assessment & Plan Note (Signed)
Continues to use BiPAP with oxygen at nighttime, no oxygen in the day.

## 2012-08-17 NOTE — Assessment & Plan Note (Signed)
Currently with no symptoms of angina. No further workup at this time. Continue current medication regimen. 

## 2012-08-17 NOTE — Assessment & Plan Note (Signed)
We discussed his heart rate with him. He is unable to afford multaq. We will continue him on beta blocker for now and discuss other options with EP. We are trying to avoid digoxin, amiodarone. Flecainide is not an option given underlying CAD.

## 2012-08-17 NOTE — Assessment & Plan Note (Signed)
Given his dry state, stable weight in the low 180 range and significant edema, edema likely secondary to other etiology such as lymphedema or venous insufficiency. He is wearing compression hose and does not seem to be bothered.

## 2012-08-20 ENCOUNTER — Telehealth: Payer: Self-pay

## 2012-08-20 NOTE — Telephone Encounter (Signed)
See below

## 2012-08-20 NOTE — Telephone Encounter (Signed)
Message copied by Marcelle Overlie on Mon Aug 20, 2012  8:38 AM ------      Message from: Antonieta Iba      Created: Fri Aug 17, 2012  6:49 PM       Can we call the company for [facility] and see if patient assistance is available. He is pain $90 per month, 250 when in the donut hole. Dowe have samples we can give? ------

## 2012-08-21 NOTE — Telephone Encounter (Signed)
Spoke with [name] (daughter) patient assistance papers are ready to be picked up. [name] (daughter) would like the forms mailed to [name]'s home address. Mailed 08/21/2012.

## 2012-08-24 ENCOUNTER — Ambulatory Visit: Payer: PRIVATE HEALTH INSURANCE | Admitting: Family Medicine

## 2012-09-05 ENCOUNTER — Telehealth: Payer: Self-pay

## 2012-09-05 NOTE — Telephone Encounter (Signed)
fyi

## 2012-09-05 NOTE — Telephone Encounter (Signed)
I would agree, cut the torsemide in half Could even hold spironolactone and take torsemide one half pill as needed

## 2012-09-05 NOTE — Telephone Encounter (Signed)
Pt's dtr is concerned Pt takes torsemide 20 mg QD PRN and spironolactone 25 mg qd PRN edema /weight gain dtr says pt takes these meds when he gains 3 pounds overnight Says when he takes these meds he loses more than he gains (i.e 4 pounds overnight) Says he then experiences dizziness and weakness  Pt asks if there is a diuretic he can take daily dtr explained to pt he is sensitive to these meds (per conversations Dr. Mariah Milling has had with them in past) I advised dtr to have pt try 1/2 tablet Demadex and 1/2 tablet spironolactone PRN weight gain instead of full pills They will try this to see if this helps and will let us know should they not notice any improvement

## 2012-09-05 NOTE — Telephone Encounter (Signed)
Pt daughter called re pt fluid medications. Daughter states this medication is very aggressive. Torsimide is causing weight gain/loss, and causing lightheadedness. Also cant stand for long periods of time

## 2012-09-07 ENCOUNTER — Encounter: Payer: Self-pay | Admitting: Family Medicine

## 2012-09-07 ENCOUNTER — Ambulatory Visit (INDEPENDENT_AMBULATORY_CARE_PROVIDER_SITE_OTHER): Payer: PRIVATE HEALTH INSURANCE | Admitting: Family Medicine

## 2012-09-07 VITALS — BP 122/78 | HR 68 | Temp 97.4°F | Wt 188.8 lb

## 2012-09-07 DIAGNOSIS — I5032 Chronic diastolic (congestive) heart failure: Secondary | ICD-10-CM

## 2012-09-07 DIAGNOSIS — I509 Heart failure, unspecified: Secondary | ICD-10-CM

## 2012-09-07 LAB — BASIC METABOLIC PANEL
BUN: 22 mg/dL (ref 6–23)
Chloride: 92 mEq/L — ABNORMAL LOW (ref 96–112)
Creat: 1.55 mg/dL — ABNORMAL HIGH (ref 0.50–1.35)

## 2012-09-07 NOTE — Patient Instructions (Addendum)
Recheck in 3 months.  Call back as needed.  Go to the lab on the way out.  We'll contact you with your lab report.  I'll work on the [employer] forms.

## 2012-09-10 ENCOUNTER — Encounter: Payer: Self-pay | Admitting: Family Medicine

## 2012-09-10 NOTE — Assessment & Plan Note (Signed)
>  40 min spent with with patient, >50% counseling and/or coordinating care, also with FMLA forms for son.  We talked about diet, low Na use, and fluid status along with all of his CHF meds, all in detail.  Needs to continue daily weight, call back if >3lbs in 1 day.  Dry weight likely ~180 lbs.  He feels well.  They'll bring in cuff to calibrate next OV.  Check BMET today.  Fu 3 months, sooner prn. He and family agree.

## 2012-09-10 NOTE — Progress Notes (Signed)
CHF f/u.  Daily weight usually ~180. BP has been 90-100/60s on home checks. I asked them to bring cuff to next visit to calibrate. No CP.  Edema improved usually. He has more UOP after demadex tx and can get light headed after use.  He talked with cards about 1/2 dose of diuretic.  This was okayed per patient report. He feels well and is in independent living.  We talked about his fluid status, diet, salt, and meds in detail. On a good day he can walk 300 ft, flat ground. On a bad day, he can walk 150 ft. Overall, much improved from prev.  He had prev iron infusion and has responded well.  Has heme f/u pending for 09/14/12.    PMH and SH reviewed  ROS: See HPI, otherwise noncontributory.  Meds, vitals, and allergies reviewed.   nad ncat Mmm IRR, not tachy ctab abd soft, not ttp Ext with 2+ edema in stockings

## 2012-09-11 ENCOUNTER — Encounter: Payer: Self-pay | Admitting: *Deleted

## 2012-09-13 ENCOUNTER — Ambulatory Visit: Payer: Self-pay | Admitting: Internal Medicine

## 2012-09-18 LAB — CBC CANCER CENTER
Basophil #: 0 x10 3/mm (ref 0.0–0.1)
Basophil %: 0.7 %
Eosinophil #: 0.1 x10 3/mm (ref 0.0–0.7)
Eosinophil %: 2.2 %
HCT: 39.9 % — ABNORMAL LOW (ref 40.0–52.0)
HGB: 12.3 g/dL — ABNORMAL LOW (ref 13.0–18.0)
Lymphocyte #: 0.5 x10 3/mm — ABNORMAL LOW (ref 1.0–3.6)
Lymphocyte %: 8.3 %
MCHC: 30.9 g/dL — ABNORMAL LOW (ref 32.0–36.0)
MCV: 81 fL (ref 80–100)
Monocyte #: 0.6 x10 3/mm (ref 0.2–1.0)
Neutrophil #: 4.3 x10 3/mm (ref 1.4–6.5)
Platelet: 187 x10 3/mm (ref 150–440)
RBC: 4.92 10*6/uL (ref 4.40–5.90)
RDW: 17.2 % — ABNORMAL HIGH (ref 11.5–14.5)
WBC: 5.5 x10 3/mm (ref 3.8–10.6)

## 2012-09-18 LAB — IRON AND TIBC
Iron Saturation: 13 %
Iron: 46 ug/dL — ABNORMAL LOW (ref 65–175)
Unbound Iron-Bind.Cap.: 321 ug/dL

## 2012-09-21 ENCOUNTER — Telehealth: Payer: Self-pay | Admitting: *Deleted

## 2012-09-21 NOTE — Telephone Encounter (Signed)
Gave samples of Multaq 400 mg samples since patient assistance has not contacted our office yet.

## 2012-09-21 NOTE — Telephone Encounter (Signed)
Pt wanting to know if we have Multaq samples

## 2012-09-21 NOTE — Telephone Encounter (Signed)
See below

## 2012-09-25 ENCOUNTER — Encounter: Payer: Self-pay | Admitting: Family Medicine

## 2012-09-25 ENCOUNTER — Telehealth: Payer: Self-pay

## 2012-09-25 ENCOUNTER — Inpatient Hospital Stay: Payer: Self-pay | Admitting: Student

## 2012-09-25 ENCOUNTER — Ambulatory Visit (INDEPENDENT_AMBULATORY_CARE_PROVIDER_SITE_OTHER): Payer: PRIVATE HEALTH INSURANCE | Admitting: Family Medicine

## 2012-09-25 VITALS — BP 108/70 | HR 89 | Temp 97.4°F

## 2012-09-25 DIAGNOSIS — J9610 Chronic respiratory failure, unspecified whether with hypoxia or hypercapnia: Secondary | ICD-10-CM

## 2012-09-25 LAB — COMPREHENSIVE METABOLIC PANEL
Alkaline Phosphatase: 131 U/L (ref 50–136)
Anion Gap: 4 — ABNORMAL LOW (ref 7–16)
BUN: 22 mg/dL — ABNORMAL HIGH (ref 7–18)
Co2: 38 mmol/L — ABNORMAL HIGH (ref 21–32)
Creatinine: 1.28 mg/dL (ref 0.60–1.30)
EGFR (African American): >60
SGPT (ALT): 26 U/L (ref 12–78)
Sodium: 134 mmol/L — ABNORMAL LOW (ref 136–145)

## 2012-09-25 LAB — CBC
HCT: 39.2 % — ABNORMAL LOW (ref 40.0–52.0)
HGB: 12.2 g/dL — ABNORMAL LOW (ref 13.0–18.0)
MCH: 25 pg — ABNORMAL LOW (ref 26.0–34.0)
MCHC: 31 g/dL — ABNORMAL LOW (ref 32.0–36.0)
MCV: 81 fL (ref 80–100)
Platelet: 198 10*3/uL (ref 150–440)
RBC: 4.86 10*6/uL (ref 4.40–5.90)
RDW: 16.3 % — ABNORMAL HIGH (ref 11.5–14.5)
WBC: 4.9 10*3/uL (ref 3.8–10.6)

## 2012-09-25 LAB — URINALYSIS, COMPLETE
Glucose,UR: NEGATIVE mg/dL (ref 0–75)
Leukocyte Esterase: NEGATIVE
Ph: 6 (ref 4.5–8.0)
Specific Gravity: 1.01 (ref 1.003–1.030)
WBC UR: NONE SEEN /HPF (ref 0–5)

## 2012-09-25 NOTE — Telephone Encounter (Signed)
Daughter called and states pt is going to PCP(Dr. Para March) this a.m. Cannot get his oxygen up above 80% this a.m. He is using oxygen during the day. States pt cannot finish sentences, goes into a "stupor" once he puts his oxygen on.  Complains of weakness, dizziness on and off, states it started when he started back taking Multaq. States BP tend to be running normal.

## 2012-09-25 NOTE — Patient Instructions (Addendum)
Go to the ER at [facility] and we'll call ahead.  Take care.  

## 2012-09-25 NOTE — Assessment & Plan Note (Addendum)
With 2 days off Bipap.  Clinically declining.  I am concerned about this and I d/w Dr. Mariah Milling with cards.  We agreed that ER eval with likely admission for relative fluid overload (at least clinically overloaded) would be reasonable.  D/w pt and family in detail.  Will go to [facility] ER by car and we'll call/fax today's note.  We didn't draw labs today in the clinic. Addendum- I called and talked with charge RN at [facility] ER.
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
## 2012-09-25 NOTE — Telephone Encounter (Signed)
dtr says she is with pt at Dr. Lianne Bushy office right now waiting to be seen Says pt [facility] and was "discharged" since levels were "doubled"  But later last week pt suddenly became very weak and sleepy Power had been off x 2 nights so he had to go without BIPAP, thought sleepiness was related to this Since then he has been using BIPAP as usual and still extremely sleepy and weak , having to wear O2 all the time now whereas before he only needed this very seldom O2 sats on RA are now 80%, but normal with O2  dtr will tell Dr. Lianne Bushy nurse to call us if needed after appt [name] will keep [place] updated as well

## 2012-09-26 ENCOUNTER — Telehealth: Payer: Self-pay | Admitting: *Deleted

## 2012-09-26 DIAGNOSIS — R4182 Altered mental status, unspecified: Secondary | ICD-10-CM

## 2012-09-26 DIAGNOSIS — I509 Heart failure, unspecified: Secondary | ICD-10-CM

## 2012-09-26 DIAGNOSIS — I4891 Unspecified atrial fibrillation: Secondary | ICD-10-CM

## 2012-09-26 LAB — BASIC METABOLIC PANEL
Calcium, Total: 8.7 mg/dL (ref 8.5–10.1)
Chloride: 91 mmol/L — ABNORMAL LOW (ref 98–107)
Creatinine: 1.32 mg/dL — ABNORMAL HIGH (ref 0.60–1.30)
EGFR (African American): >60
EGFR (Non-African Amer.): 54 — ABNORMAL LOW
Glucose: 80 mg/dL (ref 65–99)
Osmolality: 272 (ref 275–301)
Potassium: 3.9 mmol/L (ref 3.5–5.1)
Sodium: 135 mmol/L — ABNORMAL LOW (ref 136–145)

## 2012-09-26 LAB — AMMONIA: Ammonia, Plasma: 51 mcmol/L — ABNORMAL HIGH (ref 11–32)

## 2012-09-26 LAB — MAGNESIUM: Magnesium: 1.5 mg/dL — ABNORMAL LOW

## 2012-09-26 LAB — LIPID PANEL
Ldl Cholesterol, Calc: 81 mg/dL (ref 0–100)
Triglycerides: 58 mg/dL (ref 0–200)
VLDL Cholesterol, Calc: 12 mg/dL (ref 5–40)

## 2012-09-26 NOTE — Telephone Encounter (Signed)
lmtcb concerning [employer] patient assistance program. Pt is needing to fill out a updated version of application.

## 2012-09-26 NOTE — Telephone Encounter (Signed)
Spoke with pt daughter and she mentioned that her father is currently admitted at [facility] and she will stop by office to pick up pt assistance program application to complete.

## 2012-09-27 LAB — BASIC METABOLIC PANEL
BUN: 25 mg/dL — ABNORMAL HIGH (ref 7–18)
Calcium, Total: 8.6 mg/dL (ref 8.5–10.1)
Chloride: 87 mmol/L — ABNORMAL LOW (ref 98–107)
EGFR (African American): 55 — ABNORMAL LOW
EGFR (Non-African Amer.): 47 — ABNORMAL LOW
Glucose: 109 mg/dL — ABNORMAL HIGH (ref 65–99)
Osmolality: 275 (ref 275–301)
Potassium: 3.9 mmol/L (ref 3.5–5.1)
Sodium: 135 mmol/L — ABNORMAL LOW (ref 136–145)

## 2012-09-27 LAB — AMMONIA: Ammonia, Plasma: 37 mcmol/L — ABNORMAL HIGH (ref 11–32)

## 2012-09-28 LAB — BASIC METABOLIC PANEL
BUN: 22 mg/dL — ABNORMAL HIGH (ref 7–18)
Calcium, Total: 8.9 mg/dL (ref 8.5–10.1)
EGFR (Non-African Amer.): 51 — ABNORMAL LOW
Osmolality: 270 (ref 275–301)

## 2012-09-28 LAB — AMMONIA: Ammonia, Plasma: 70 mcmol/L — ABNORMAL HIGH (ref 11–32)

## 2012-09-29 DIAGNOSIS — R4182 Altered mental status, unspecified: Secondary | ICD-10-CM

## 2012-09-29 DIAGNOSIS — I509 Heart failure, unspecified: Secondary | ICD-10-CM

## 2012-09-29 LAB — BASIC METABOLIC PANEL
BUN: 22 mg/dL — ABNORMAL HIGH (ref 7–18)
Calcium, Total: 8.7 mg/dL (ref 8.5–10.1)
Chloride: 88 mmol/L — ABNORMAL LOW (ref 98–107)
Creatinine: 1.11 mg/dL (ref 0.60–1.30)
EGFR (African American): >60
EGFR (Non-African Amer.): >60
Potassium: 4.5 mmol/L (ref 3.5–5.1)

## 2012-09-29 LAB — AMMONIA: Ammonia, Plasma: 51 mcmol/L — ABNORMAL HIGH (ref 11–32)

## 2012-09-30 LAB — COMPREHENSIVE METABOLIC PANEL
Albumin: 3.2 g/dL — ABNORMAL LOW (ref 3.4–5.0)
BUN: 20 mg/dL — ABNORMAL HIGH (ref 7–18)
Bilirubin,Total: 0.7 mg/dL (ref 0.2–1.0)
Chloride: 88 mmol/L — ABNORMAL LOW (ref 98–107)
Co2: 43 mmol/L (ref 21–32)
Creatinine: 1.17 mg/dL (ref 0.60–1.30)
Glucose: 81 mg/dL (ref 65–99)
Osmolality: 262 (ref 275–301)
Potassium: 4.2 mmol/L (ref 3.5–5.1)
SGOT(AST): 20 U/L (ref 15–37)
SGPT (ALT): 21 U/L (ref 12–78)
Sodium: 130 mmol/L — ABNORMAL LOW (ref 136–145)
Total Protein: 6.4 g/dL (ref 6.4–8.2)

## 2012-10-01 ENCOUNTER — Telehealth: Payer: Self-pay | Admitting: *Deleted

## 2012-10-01 DIAGNOSIS — I4891 Unspecified atrial fibrillation: Secondary | ICD-10-CM

## 2012-10-01 LAB — BASIC METABOLIC PANEL
BUN: 19 mg/dL — ABNORMAL HIGH (ref 7–18)
Calcium, Total: 8.7 mg/dL (ref 8.5–10.1)
Chloride: 90 mmol/L — ABNORMAL LOW (ref 98–107)
Co2: 42 mmol/L (ref 21–32)
EGFR (African American): >60
Glucose: 104 mg/dL — ABNORMAL HIGH (ref 65–99)
Potassium: 4.1 mmol/L (ref 3.5–5.1)
Sodium: 129 mmol/L — ABNORMAL LOW (ref 136–145)

## 2012-10-01 LAB — CBC WITH DIFFERENTIAL/PLATELET
Basophil #: 0 10*3/uL (ref 0.0–0.1)
Eosinophil %: 4.5 %
HCT: 35.6 % — ABNORMAL LOW (ref 40.0–52.0)
Lymphocyte %: 9.5 %
MCH: 25.3 pg — ABNORMAL LOW (ref 26.0–34.0)
MCHC: 30.9 g/dL — ABNORMAL LOW (ref 32.0–36.0)
Monocyte #: 0.5 x10 3/mm (ref 0.2–1.0)
Monocyte %: 10.7 %
Neutrophil %: 74.5 %
Platelet: 168 10*3/uL (ref 150–440)
RBC: 4.35 10*6/uL — ABNORMAL LOW (ref 4.40–5.90)
RDW: 15.5 % — ABNORMAL HIGH (ref 11.5–14.5)

## 2012-10-01 LAB — AMMONIA: Ammonia, Plasma: 67 mcmol/L — ABNORMAL HIGH (ref 11–32)

## 2012-10-01 NOTE — Telephone Encounter (Signed)
My mistake, it is HH.

## 2012-10-01 NOTE — Telephone Encounter (Signed)
[name] says Christian [name] was referred to them by the hospital and may be discharged as early as today.  They are requesting nursing services and PT .  Is this okay with you?

## 2012-10-01 NOTE — Telephone Encounter (Signed)
Please give the verbal order for nursing services and PT but clarify if this is for [facility] or hospice.  Thanks.

## 2012-10-02 DIAGNOSIS — I4891 Unspecified atrial fibrillation: Secondary | ICD-10-CM

## 2012-10-03 DIAGNOSIS — I4891 Unspecified atrial fibrillation: Secondary | ICD-10-CM

## 2012-10-03 LAB — BASIC METABOLIC PANEL
BUN: 23 mg/dL — ABNORMAL HIGH (ref 7–18)
Chloride: 93 mmol/L — ABNORMAL LOW (ref 98–107)
Co2: 40 mmol/L (ref 21–32)
Creatinine: 1.22 mg/dL (ref 0.60–1.30)
Osmolality: 269 (ref 275–301)
Sodium: 132 mmol/L — ABNORMAL LOW (ref 136–145)

## 2012-10-03 LAB — TSH: Thyroid Stimulating Horm: 1.92 u[IU]/mL

## 2012-10-03 LAB — AMMONIA: Ammonia, Plasma: 88 mcmol/L — ABNORMAL HIGH (ref 11–32)

## 2012-10-04 LAB — BASIC METABOLIC PANEL
BUN: 26 mg/dL — ABNORMAL HIGH (ref 7–18)
Calcium, Total: 8.6 mg/dL (ref 8.5–10.1)
Chloride: 93 mmol/L — ABNORMAL LOW (ref 98–107)
Co2: 41 mmol/L (ref 21–32)
Creatinine: 1.52 mg/dL — ABNORMAL HIGH (ref 0.60–1.30)
Glucose: 72 mg/dL (ref 65–99)
Potassium: 4.6 mmol/L (ref 3.5–5.1)
Sodium: 135 mmol/L — ABNORMAL LOW (ref 136–145)

## 2012-10-05 ENCOUNTER — Telehealth: Payer: Self-pay | Admitting: Family Medicine

## 2012-10-05 DIAGNOSIS — I4891 Unspecified atrial fibrillation: Secondary | ICD-10-CM

## 2012-10-05 LAB — CREATININE, SERUM
EGFR (African American): 57 — ABNORMAL LOW
EGFR (Non-African Amer.): 49 — ABNORMAL LOW

## 2012-10-05 LAB — CBC WITH DIFFERENTIAL/PLATELET
Basophil %: 0.7 %
Eosinophil #: 0 10*3/uL (ref 0.0–0.7)
Eosinophil %: 0.4 %
HCT: 36.9 % — ABNORMAL LOW (ref 40.0–52.0)
Lymphocyte #: 0.4 10*3/uL — ABNORMAL LOW (ref 1.0–3.6)
MCH: 25.8 pg — ABNORMAL LOW (ref 26.0–34.0)
MCHC: 31.2 g/dL — ABNORMAL LOW (ref 32.0–36.0)
MCV: 83 fL (ref 80–100)
Monocyte #: 0.6 x10 3/mm (ref 0.2–1.0)
Neutrophil #: 6 10*3/uL (ref 1.4–6.5)
Neutrophil %: 83.8 %
Platelet: 162 10*3/uL (ref 150–440)
RBC: 4.46 10*6/uL (ref 4.40–5.90)
RDW: 15.3 % — ABNORMAL HIGH (ref 11.5–14.5)
WBC: 7.1 10*3/uL (ref 3.8–10.6)

## 2012-10-05 NOTE — Telephone Encounter (Signed)
Form in your inbox 

## 2012-10-05 NOTE — Telephone Encounter (Signed)
Christian [name] son dropped off a [employer]  form to be filled out .

## 2012-10-06 LAB — BASIC METABOLIC PANEL
BUN: 27 mg/dL — ABNORMAL HIGH (ref 7–18)
Chloride: 92 mmol/L — ABNORMAL LOW (ref 98–107)
EGFR (African American): >60
EGFR (Non-African Amer.): >60
Glucose: 86 mg/dL (ref 65–99)
Sodium: 133 mmol/L — ABNORMAL LOW (ref 136–145)

## 2012-10-06 LAB — PHOSPHORUS: Phosphorus: 1 mg/dL — CL (ref 2.5–4.9)

## 2012-10-06 LAB — MAGNESIUM: Magnesium: 1.7 mg/dL — ABNORMAL LOW

## 2012-10-06 LAB — POTASSIUM: Potassium: 4.1 mmol/L (ref 3.5–5.1)

## 2012-10-07 LAB — MAGNESIUM: Magnesium: 1.8 mg/dL

## 2012-10-07 LAB — PHOSPHORUS: Phosphorus: 2.3 mg/dL — ABNORMAL LOW (ref 2.5–4.9)

## 2012-10-07 LAB — AMMONIA: Ammonia, Plasma: 61 mcmol/L — ABNORMAL HIGH (ref 11–32)

## 2012-10-07 NOTE — Telephone Encounter (Signed)
I'll work on the hard copy.  

## 2012-10-08 ENCOUNTER — Encounter: Payer: Self-pay | Admitting: Family Medicine

## 2012-10-08 LAB — MAGNESIUM: Magnesium: 1.7 mg/dL — ABNORMAL LOW

## 2012-10-08 LAB — PHOSPHORUS: Phosphorus: 2 mg/dL — ABNORMAL LOW (ref 2.5–4.9)

## 2012-10-09 ENCOUNTER — Ambulatory Visit: Payer: Self-pay | Admitting: Internal Medicine

## 2012-10-09 ENCOUNTER — Telehealth: Payer: Self-pay

## 2012-10-09 LAB — POTASSIUM: Potassium: 4.1 mmol/L (ref 3.5–5.1)

## 2012-10-09 LAB — HEMOGLOBIN: HGB: 10.3 g/dL — ABNORMAL LOW (ref 13.0–18.0)

## 2012-10-09 LAB — PHOSPHORUS: Phosphorus: 2.4 mg/dL — ABNORMAL LOW (ref 2.5–4.9)

## 2012-10-09 LAB — MAGNESIUM: Magnesium: 2 mg/dL

## 2012-10-09 NOTE — Telephone Encounter (Signed)
Message copied by [facility], Cataleia Gade E on Tue Oct 09, 2012  8:01 AM ------      Message from: Coralee Rud      Created: Mon Oct 08, 2012  3:43 PM      Regarding: tcm       appt 10/16/12 with Mariah Milling ------

## 2012-10-09 NOTE — Telephone Encounter (Signed)
TCM  

## 2012-10-09 NOTE — Telephone Encounter (Signed)
Pt d/c today 4/1 Will attempt TCM call #1 tomm 4/2

## 2012-10-10 NOTE — Telephone Encounter (Signed)
I was able to speak with [name], pt's dtr, who says pt was d/c yesterday from [facility] He is staying at his sister's home dtr is managing pt's meds Has all RX she needs He was started on 2 new meds- provigil and rifaximin, says these medications are very expensive, ($90 and $270/month) I gave her website [email or web address]. She will look into this for assistance with cost She confirms appt with [place], neuro and PCP Says pt is alert today, oriented BP=90-100 sbp Denies dizziness today Wearing O2 0.5 L at rest, 2 L with exertion PT and HH nurse coming today

## 2012-10-16 ENCOUNTER — Ambulatory Visit (INDEPENDENT_AMBULATORY_CARE_PROVIDER_SITE_OTHER): Payer: PRIVATE HEALTH INSURANCE | Admitting: Cardiovascular Disease

## 2012-10-16 ENCOUNTER — Encounter: Payer: Self-pay | Admitting: Cardiovascular Disease

## 2012-10-16 VITALS — BP 110/74 | HR 100 | Ht 70.0 in | Wt 196.8 lb

## 2012-10-16 DIAGNOSIS — E872 Acidosis, unspecified: Secondary | ICD-10-CM

## 2012-10-16 DIAGNOSIS — R609 Edema, unspecified: Secondary | ICD-10-CM

## 2012-10-16 DIAGNOSIS — I10 Essential (primary) hypertension: Secondary | ICD-10-CM

## 2012-10-16 DIAGNOSIS — J9610 Chronic respiratory failure, unspecified whether with hypoxia or hypercapnia: Secondary | ICD-10-CM

## 2012-10-16 DIAGNOSIS — I4891 Unspecified atrial fibrillation: Secondary | ICD-10-CM

## 2012-10-16 MED ORDER — POTASSIUM CHLORIDE CRYS ER 10 MEQ PO TBCR: 10.0000 meq | EXTENDED_RELEASE_TABLET | Freq: Every day | ORAL | Status: DC | PRN

## 2012-10-16 MED ORDER — TORSEMIDE 20 MG PO TABS: 40.0000 mg | ORAL_TABLET | Freq: Two times a day (BID) | ORAL | Status: DC | PRN

## 2012-10-16 MED ORDER — SPIRONOLACTONE 25 MG PO TABS: 12.5000 mg | ORAL_TABLET | Freq: Every day | ORAL | Status: DC | PRN

## 2012-10-16 MED ORDER — MIDODRINE HCL 2.5 MG PO TABS: 2.5000 mg | ORAL_TABLET | Freq: Three times a day (TID) | ORAL | Status: DC | PRN

## 2012-10-16 NOTE — Patient Instructions (Addendum)
Please take torsemide 20 mg daily for weight 182 to 185 pounds Please take torsemide 40 mg in the morning for weight 186 to 190 Please take torsemide 40 mg twice a day (Am and after lunch) for weight greater than 190 pounds  For every torsemide, take a torsemide pill  For low blood pressure, take midodrine one three times a day,  ok to take a second dose if blood pressure stays low   Please call us if you have new issues that need to be addressed before your next appt.  Your physician wants you to follow-up in: 1 months.

## 2012-10-16 NOTE — Assessment & Plan Note (Signed)
Encouraged him to stay on his BiPAP every night and with naps. He is on Provigil to increase respiratory drive.

## 2012-10-16 NOTE — Progress Notes (Signed)
Patient ID: Christian [name], male    DOB: 03-Oct-1940, 72 y.o.   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   h/o profound esophagitis, weight loss of more than 50 pounds, anorexia, lack of interest, food getting stuck in his throat, difficulty swallowing dry mouth, periods of hallucinations and memory problems.  hospital at [facility] September 02 2011 following an EGD that showed severe esophagitis, moderate gastritis,.  cardioversion  for atrial fibrillation which was successful though in followup he converted back to atrial fibrillation. asymptomatic in June 2013 when seen in clinic while in atrial fibrillation .   Several admissions to the hospital for failure to thrive, leg weakness, mental status changes, hypoxia, hypercapnic respiratory distress.  Recently discharged last week with discharge weight of 183 pounds. He presents today with family and weight has increased to 190 pounds. He has been taking torsemide intermittently to the past week. Some increased slowness and more somnolence per the family. But in general had been doing much better. Hospital course was challenging and in the end it appeared that aggressive bipap for hypercapnia in the intensive care unit, fluid management with diuretics, management of his anemia, as well as other medications for respiratory drive and elevated ammonia levels seemed to help his symptoms.   In the clinic today, he states that he would like to be a full code with ventilator if needed but not long-term.  Previous  Echocardiogram showed normal LV systolic function greater than 55%, borderline elevated  right ventricular systolic pressures pulmonic aortic valve stenosis. and   EKG : Atrial fibrillation with ventricular rate 100 beats per minute, nonspecific ST abnormality , PVCs   Outpatient Encounter Prescriptions as of 10/16/2012  Medication Sig Dispense Refill  . acetaminophen (TYLENOL) 500 MG tablet Take 500 mg by mouth every 6 (six) hours as needed.      . dronedarone (MULTAQ) 400 MG tablet Take 1 tablet (400 mg total) by mouth 2 (two) times daily with a meal.  60 tablet  6  . ferrous gluconate (FERGON) 325 MG tablet Take 325 mg by mouth daily with breakfast.      . metoprolol tartrate (LOPRESSOR) 25 MG tablet Take 1 tablet (25 mg total) by mouth 2 (two) times daily.  60 tablet  3  . modafinil (PROVIGIL) 200 MG tablet Take 200 mg by mouth daily.       . nitroGLYCERIN (NITROSTAT) 0.4 MG SL tablet Place 0.4 mg under the tongue every 5 (five) minutes as needed.        . NON FORMULARY Oxygen @ 2 liters at bedtime.      . Nutritional Supplements (FEEDING SUPPLEMENT, GLUCERNA 1.2 CAL,) LIQD Take 237 mLs by mouth daily as needed.      Marland Kitchen omeprazole (PRILOSEC) 20 MG capsule Take 1 capsule (20 mg total) by mouth daily.  30 capsule  3  . polyethylene glycol powder (GLYCOLAX/MIRALAX) powder Take 17 g by mouth daily.  255 g  3  . potassium chloride (K-DUR,KLOR-CON) 10 MEQ tablet Take 1 tablet (10 mEq total) by mouth daily as needed.  90 tablet  3  . spironolactone (ALDACTONE) 25 MG tablet Take 0.5-1 tablets (12.5-25 mg  total) by mouth daily as needed.  90 tablet  3  . Tamsulosin HCl (FLOMAX) 0.4 MG CAPS Take 1 capsule (0.4 mg total) by mouth daily after supper.  90 capsule  3  . torsemide (DEMADEX) 20 MG tablet Take 2 tablets (40 mg total) by mouth 2 (two) times daily as needed.  120 tablet  6  . XARELTO 20 MG TABS 20 mg daily.       Marland Kitchen XIFAXAN 550 MG TABS 550 mg 2 (two) times daily.       . [DISCONTINUED] potassium chloride (K-DUR,KLOR-CON) 10 MEQ tablet Take 10 mEq by mouth daily as needed.      .  [DISCONTINUED] spironolactone (ALDACTONE) 25 MG tablet Take 12.5-25 mg by mouth daily as needed.       . [DISCONTINUED] torsemide (DEMADEX) 20 MG tablet Take 10-20 mg by mouth daily as needed.       . midodrine (PROAMATINE) 2.5 MG tablet Take 1 tablet (2.5 mg total) by mouth 3 (three) times daily as needed.  90 tablet  6  . [DISCONTINUED] potassium chloride (K-DUR) 10 MEQ tablet        No facility-administered encounter medications on file as of 10/16/2012.    Review of Systems  Constitutional: Negative.   HENT: Negative.   Eyes: Negative.   Respiratory: Negative.   Cardiovascular: Positive for leg swelling.  Gastrointestinal: Negative.   Musculoskeletal: Negative.   Skin: Negative.   Neurological: Negative.   Psychiatric/Behavioral: Negative.   All other systems reviewed and are negative.    BP 110/74  Pulse 100  Ht 5\' 10"  (1.778 m)  Wt 196 lb 12 oz (89.245 kg)  BMI 28.23 kg/m2  Physical Exam  Nursing note and vitals reviewed. Constitutional: He is oriented to person, place, and time. He appears well-developed and well-nourished.  HENT:  Head: Normocephalic.  Nose: Nose normal.  Mouth/Throat: Oropharynx is clear and moist.  Eyes: Conjunctivae are normal. Pupils are equal, round, and reactive to light.  Neck: Normal range of motion. Neck supple. No JVD present.  Cardiovascular: Normal rate, S1 normal, S2 normal and intact distal pulses.  An irregularly irregular rhythm present. Exam reveals no gallop and no friction rub.   Murmur heard.  Crescendo systolic murmur is present with a grade of 2/6   1+ to 2+ edema bilaterally, compression hose in place  Pulmonary/Chest: Effort normal and breath sounds normal. No respiratory distress. He has no wheezes. He has no rales. He exhibits no tenderness.  Abdominal: Soft. Bowel sounds are normal. He exhibits no distension. There is no tenderness.  Musculoskeletal: Normal range of motion. He exhibits no edema and no tenderness.   Lymphadenopathy:    He has no cervical adenopathy.  Neurological: He is alert and oriented to person, place, and time. Coordination normal.  Skin: Skin is warm and dry. No rash noted. No erythema.  Psychiatric: He has a normal mood and affect. His behavior is normal. Judgment and thought content normal.      Assessment and Plan

## 2012-10-16 NOTE — Assessment & Plan Note (Signed)
I suspect that this is a big part of his problem and need for recurrent admissions. CO2 retention could be triggered by other issues in addition to a neuromotor weakness. He does have followup with neurology though uncertain if there is any cure. We'll need to keep his weight stable in the low 180 range. We'll need to keep his blood count as high as possible, preferably hematocrit of 30. Important to stay on BiPAP as much is possible. He may need a new BiPAP machine that is able to have the respiratory rate adjusted. May need new sleep study.

## 2012-10-16 NOTE — Assessment & Plan Note (Addendum)
Acute on chronic diastolic CHF. Weight is up 7 pounds from discharge weight. He has not been taking torsemide on a regular basis. We have suggested he follow the protocol as below: Please take torsemide 20 mg daily for weight 182 to 185 pounds Please take torsemide 40 mg in the morning for weight 186 to 190 Please take torsemide 40 mg twice a day (Am and after lunch) for weight greater than 190 pounds

## 2012-10-18 ENCOUNTER — Encounter: Payer: Self-pay | Admitting: Family Medicine

## 2012-10-18 ENCOUNTER — Other Ambulatory Visit: Payer: Self-pay | Admitting: Family Medicine

## 2012-10-18 ENCOUNTER — Ambulatory Visit (INDEPENDENT_AMBULATORY_CARE_PROVIDER_SITE_OTHER): Payer: PRIVATE HEALTH INSURANCE | Admitting: Family Medicine

## 2012-10-18 VITALS — BP 102/70 | HR 68 | Temp 97.6°F | Wt 194.0 lb

## 2012-10-18 DIAGNOSIS — D649 Anemia, unspecified: Secondary | ICD-10-CM

## 2012-10-18 DIAGNOSIS — N4829 Other inflammatory disorders of penis: Secondary | ICD-10-CM

## 2012-10-18 DIAGNOSIS — K769 Liver disease, unspecified: Secondary | ICD-10-CM

## 2012-10-18 DIAGNOSIS — G4730 Sleep apnea, unspecified: Secondary | ICD-10-CM

## 2012-10-18 DIAGNOSIS — E872 Acidosis: Secondary | ICD-10-CM

## 2012-10-18 LAB — CBC WITH DIFFERENTIAL/PLATELET
Basophils Relative: 0.3 % (ref 0.0–3.0)
Eosinophils Absolute: 0.1 10*3/uL (ref 0.0–0.7)
Eosinophils Relative: 2.2 % (ref 0.0–5.0)
HCT: 36 % — ABNORMAL LOW (ref 39.0–52.0)
Lymphs Abs: 0.8 10*3/uL (ref 0.7–4.0)
MCHC: 31.2 g/dL (ref 30.0–36.0)
MCV: 81.1 fl (ref 78.0–100.0)
Monocytes Absolute: 0.6 10*3/uL (ref 0.1–1.0)
Platelets: 215 10*3/uL (ref 150.0–400.0)
RBC: 4.44 Mil/uL (ref 4.22–5.81)
WBC: 5.4 10*3/uL (ref 4.5–10.5)

## 2012-10-18 LAB — COMPREHENSIVE METABOLIC PANEL
ALT: 18 U/L (ref 0–53)
AST: 21 U/L (ref 0–37)
CO2: 38 mEq/L — ABNORMAL HIGH (ref 19–32)
Chloride: 92 mEq/L — ABNORMAL LOW (ref 96–112)
GFR: 57.3 mL/min — ABNORMAL LOW (ref >60.00)
Sodium: 139 mEq/L (ref 135–145)
Total Bilirubin: 0.6 mg/dL (ref 0.3–1.2)
Total Protein: 6.4 g/dL (ref 6.0–8.3)

## 2012-10-18 LAB — AMMONIA: Ammonia, Plasma: 41 mcmol/L — ABNORMAL HIGH (ref 11–32)

## 2012-10-18 LAB — PHOSPHORUS: Phosphorus: 3.3 mg/dL (ref 2.3–4.6)

## 2012-10-18 MED ORDER — OMEPRAZOLE 20 MG PO CPDR: 20.0000 mg | DELAYED_RELEASE_CAPSULE | Freq: Every day | ORAL | Status: DC

## 2012-10-18 MED ORDER — TAMSULOSIN HCL 0.4 MG PO CAPS: 0.4000 mg | ORAL_CAPSULE | Freq: Every day | ORAL | Status: DC

## 2012-10-18 NOTE — Patient Instructions (Signed)
Go to the lab on the way out.  We'll contact you with your lab report.  Keep using monistat in the meantime.  Call about the sleep study.  We can consider changing to episodic miralax when I see your ammonia level.  I'll await the neurology appointment notes.  Take care.

## 2012-10-19 ENCOUNTER — Telehealth: Payer: Self-pay | Admitting: *Deleted

## 2012-10-19 DIAGNOSIS — H9190 Unspecified hearing loss, unspecified ear: Secondary | ICD-10-CM

## 2012-10-19 NOTE — Telephone Encounter (Signed)
[name] (daughter) called in saying that it has been requested that you write an order for a sleep study and then fax it to [facility]  Fax #:  [phone] and have them contact her to schedule the sleep study ASAP.  They will need a PA from the health insurance but that usually takes about a 24 hour turnaround.  [name]:  [phone]

## 2012-10-19 NOTE — Telephone Encounter (Signed)
Christian [name], daughter, requesting referral to [facility] for pt to get hearing aids and have a hearing test.  Insurance will cover if he is seen here, office told her that they need referral from his PCP.

## 2012-10-21 NOTE — Telephone Encounter (Signed)
ENT referral in.  Order for the sleep study written. Thanks.

## 2012-10-21 NOTE — Addendum Note (Signed)
Addended by: Joaquim Nam on: 10/21/2012 10:21 PM   Modules accepted: Orders

## 2012-10-21 NOTE — Progress Notes (Signed)
To recap per patient and family- admitted with sx of CO2 retention and/or CHF/CRF with subsequent possible ASP PNA.  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  NH4 was up and he has been started on XIFAXAN with plan to change to lactulose depending on his f/u labs. He had penile irritation from foley.  He'll need repeat sleep study given his CO2 retention. Overall he is improved from preadmission status and his weight/BP are being monitored at home.  He has been started on provigil in the meantime. He is due for repeat labs.    He isn't particularly SOB today. He has not chest pain and he is alert, not drowsy.   PMH and SH reviewed  ROS: See HPI, otherwise noncontributory.  Meds, vitals, and allergies reviewed.   nad ncat Mmm Neck supple IRR, murmur noted ctab abd soft, not ttp Ext in compression stockings.  Speaking on complete sentences.  Off o2. Ambulatory.  Foreskin retractable but irritated with fungal elements present.

## 2012-10-22 ENCOUNTER — Encounter: Payer: Self-pay | Admitting: *Deleted

## 2012-10-22 ENCOUNTER — Encounter: Payer: Self-pay | Admitting: Family Medicine

## 2012-10-22 DIAGNOSIS — N4829 Other inflammatory disorders of penis: Secondary | ICD-10-CM | POA: Insufficient documentation

## 2012-10-22 NOTE — Telephone Encounter (Signed)
Order for sleep study faxed.  Jennifer notified.

## 2012-10-22 NOTE — Assessment & Plan Note (Signed)
Improved some in meantime with OTC monistat, continue as is.  He can retract the foreskin.  Should improve, resolve.

## 2012-10-22 NOTE — Assessment & Plan Note (Addendum)
Recently admitted with sx of CO2 retention and/or CHF/CRF with subsequent possible ASP PNA. 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 NH4 was up and he has been started on XIFAXAN with plan to change to lactulose depending on his f/u labs. Overall he is improved from preadmission status and his weight/BP are being monitored at home. He has been started on provigil in the meantime. He is due for repeat labs. See notes on labs. I'll be in touch with cards and will await neuro notes.  This was a detailed visit, >45 min spent with face to face with patient, >50% counseling and/or coordinating care

## 2012-10-22 NOTE — Assessment & Plan Note (Signed)
Refer for repeat sleep study.

## 2012-10-24 DIAGNOSIS — J9620 Acute and chronic respiratory failure, unspecified whether with hypoxia or hypercapnia: Secondary | ICD-10-CM

## 2012-10-24 DIAGNOSIS — I509 Heart failure, unspecified: Secondary | ICD-10-CM

## 2012-10-24 DIAGNOSIS — I5033 Acute on chronic diastolic (congestive) heart failure: Secondary | ICD-10-CM

## 2012-10-30 ENCOUNTER — Telehealth: Payer: Self-pay

## 2012-10-30 NOTE — Telephone Encounter (Signed)
He is on these meds as directed by cardiology and this clinic. His K was normal on last check.  The input was not needed.  I'll address their papers when received.

## 2012-10-30 NOTE — Telephone Encounter (Signed)
Bari Edward nurse case mgr with [facility] said [facility] pharmacist has reviewed pts med list and pt is taking Spironolactone and Potassium; these 2 meds should not be given together at same time; can cause hyperkalemia and can lead to cardiac arrest. Berdette will request [facility] to fax written info to Dr Para March also.  Also pt has Diagnosis of COPD but is not on COPD medications.Please advise.

## 2012-10-31 ENCOUNTER — Telehealth: Payer: Self-pay

## 2012-10-31 ENCOUNTER — Telehealth: Payer: Self-pay | Admitting: Family Medicine

## 2012-10-31 NOTE — Telephone Encounter (Signed)
Please call patient's daughter regarding medications for CHF.

## 2012-10-31 NOTE — Telephone Encounter (Signed)
I gave the pt's daughter (at the last OV) a hand written order on a rx pad for an ammonia level to be done at [facility], since we can't draw it here in the clinic.  This was discussed at the OV.  The ammonia levels that came in were from the prev admission, not the most recent level. I need them to get the level drawn as discussed at [facility] and then sent to me so I can potentially change his meds.  Thanks.

## 2012-10-31 NOTE — Telephone Encounter (Signed)
Pt daughter called and wants to discuss medication regarding her CHF. Please call. Pt daughter asks for you.

## 2012-11-01 ENCOUNTER — Telehealth: Payer: Self-pay | Admitting: *Deleted

## 2012-11-01 NOTE — Telephone Encounter (Signed)
I spoke with [name] and she says they went over to [facility] right after they left here at their last OV which she thinks was 10/18/12.  They went to the [facility] and actually had to go through the admission process to get the lab work done.  She was wondering if this result was somehow included in the faxed paperwork that you received recently from [facility].  If not, I will try to phone Med [facility] and find out where we go from here.

## 2012-11-01 NOTE — Telephone Encounter (Signed)
LMOVM of daughter's contact number.

## 2012-11-01 NOTE — Telephone Encounter (Signed)
Please talk to me about this. See prev notes in chart.

## 2012-11-01 NOTE — Telephone Encounter (Signed)
Disagree with case managers suggestions. Torsemide is not weaker based on my experience treating heart failure. Would continue to do as  he is doing.  How is he doing?

## 2012-11-01 NOTE — Telephone Encounter (Signed)
I went through all of them; it wasn't there.  I've been looking for this specifically- this wasn't dropped on our end.  I haven't seen the result yet.  Please call about the results. Thanks.

## 2012-11-01 NOTE — Telephone Encounter (Signed)
Phoned the lab at [facility].  They are faxing a hard copy but she also gave me a verbal. Ammonia Plasma Level 41 done on 10/18/12.  Normal range (11-31).

## 2012-11-01 NOTE — Telephone Encounter (Signed)
See additional phone note. 

## 2012-11-01 NOTE — Telephone Encounter (Signed)
Patient's daughter is calling to check on lab results (ammonia) and to find out if his medication should be changed or if he needs to refill what he is currently taking (2 pills per day).  She says you mentioned possibly using Lactulose twice a week.  Please advise.

## 2012-11-01 NOTE — Telephone Encounter (Signed)
Dtr wanted to make Dr. Mariah Milling aware of some suggestions [employer] Case Manager is making re:pt's medications Says hey are 1) advising pt/dtr that lasix is "stronger" than torsemide and should ask to switch, also suggesting 2) torsemide, spironolactone and KCL should not all be taken together in the mornings as he has been taking, should be taking diuretics in am and KCL in pm. I explained to [facility] Dr. Mariah Milling is aware of the pharmacology [id number] and has a reason for prescribing the medications for pt as he does. I explained torsemide has actually been found to work better than lasix with some pts I advised [name] to continue giving ot medications at same times as he has been taking I told her I would make Dr. Mariah Milling aware of these suggestions but, otherwise, pt should continue as prescribed Understanding verb

## 2012-11-02 ENCOUNTER — Encounter: Payer: Self-pay | Admitting: *Deleted

## 2012-11-02 MED ORDER — LACTULOSE ENCEPHALOPATHY 10 GM/15ML PO SOLN: ORAL | Status: DC

## 2012-11-02 NOTE — Telephone Encounter (Signed)
Left detailed message on VM of Jennfier (daughter).  I just need to know whether to mail the order for the next lab draw or if she will pick it up.

## 2012-11-02 NOTE — Telephone Encounter (Signed)
Daughter says to please mail the order.  Done.

## 2012-11-02 NOTE — Telephone Encounter (Signed)
Noted, then I would try to me the switch.  I would stop the xifaxan for now and start taking lactulose twice a week.  If any gradual inc in confusion or respiratory changes, I would give an extra dose and notify the clinic.  rx for lactulose sent. I would recheck an ammonia level about 10 days after the change.  Hard copy order written.  Thanks.

## 2012-11-02 NOTE — Telephone Encounter (Signed)
I spoke with the patient's daughter and made her aware of Dr. Windell Hummingbird recommendations. She states that he is doing well this week. I will forward to Dr. Mariah Milling as an Lorain Childes.

## 2012-11-08 ENCOUNTER — Telehealth: Payer: Self-pay | Admitting: *Deleted

## 2012-11-08 NOTE — Telephone Encounter (Signed)
FYI:   Patient saw Neurology at [facility], Dr. Sherryll Burger (Dr. Kemper Durie retired).  Dr. Sherryll Burger says it is okay not to take the Provigil for a temporary time-frame.  On initial exam, Dr. Sherryll Burger says that he didn't see that the patient had any mytonia and that he is going to do the nerve conduction study on 11/20/12 .  They are to observe the patient in the meantime, off of the medication.  Dr. Sherryll Burger says his sleep apnea MD would be the best MD to handle this medicine.  His MD is Dr. Dela_____with the [facility].  They are still waiting on the sleep study to clear the insurance.  They were told it usually takes about 2 weeks and it has now been about that long.

## 2012-11-09 NOTE — Telephone Encounter (Signed)
Noted, I'll await recs from the other clinics.

## 2012-11-13 ENCOUNTER — Encounter: Payer: Self-pay | Admitting: Family Medicine

## 2012-11-20 ENCOUNTER — Other Ambulatory Visit: Payer: Self-pay | Admitting: Family Medicine

## 2012-11-20 LAB — AMMONIA: Ammonia, Plasma: 60 mcmol/L — ABNORMAL HIGH (ref 11–32)

## 2012-11-21 ENCOUNTER — Encounter: Payer: Self-pay | Admitting: Family Medicine

## 2012-12-06 ENCOUNTER — Encounter: Payer: Self-pay | Admitting: Family Medicine

## 2012-12-06 ENCOUNTER — Telehealth: Payer: Self-pay

## 2012-12-06 MED ORDER — RIVAROXABAN 20 MG PO TABS: 20.0000 mg | ORAL_TABLET | Freq: Every day | ORAL | Status: DC

## 2012-12-06 NOTE — Telephone Encounter (Signed)
Refill sent for xarelto 20 mg take one tablet daily.

## 2012-12-07 ENCOUNTER — Telehealth: Payer: Self-pay

## 2012-12-07 NOTE — Telephone Encounter (Signed)
Notified patient's daughter the Karie Soda has been approved through the patient assistance program and the eligibility will end on 07/10/2013.  Told the daughter we will contact them when the Multaq is here available.

## 2012-12-09 ENCOUNTER — Encounter: Payer: Self-pay | Admitting: Family Medicine

## 2012-12-12 ENCOUNTER — Telehealth: Payer: Self-pay

## 2012-12-12 NOTE — Telephone Encounter (Signed)
LMOM Multaq 400 mg from the patient assistance program is available to be picked up. Told medication will be at our front desk.

## 2012-12-13 ENCOUNTER — Other Ambulatory Visit: Payer: Self-pay

## 2012-12-13 NOTE — Telephone Encounter (Signed)
Pt daughter would like paper work for [employer], needs to be "reduced". Please call

## 2012-12-14 ENCOUNTER — Telehealth: Payer: Self-pay | Admitting: *Deleted

## 2012-12-14 NOTE — Telephone Encounter (Signed)
Message copied by Florene Glen on Fri Dec 14, 2012  8:15 AM ------      Message from: Florene Glen      Created: Fri Jul 06, 2012  2:43 PM       Repeat renal US in 6 months to assess renal cyst ------

## 2012-12-14 NOTE — Telephone Encounter (Signed)
Mailed pt a letter requesting he call to schedule a repeat u/s to ensure stability of r renal cysts. Pt is in poor health with a recent admission to [facility].

## 2012-12-14 NOTE — Telephone Encounter (Signed)
LMOMTCB

## 2012-12-17 ENCOUNTER — Telehealth: Payer: Self-pay

## 2012-12-17 NOTE — Telephone Encounter (Signed)
Pt daughter returning your call from friday

## 2012-12-18 ENCOUNTER — Other Ambulatory Visit: Payer: Self-pay

## 2012-12-18 MED ORDER — METOPROLOL TARTRATE 25 MG PO TABS: 25.0000 mg | ORAL_TABLET | Freq: Two times a day (BID) | ORAL | Status: DC

## 2012-12-18 NOTE — Telephone Encounter (Signed)
Refill sent for metoprolol but just waiting to find out what patient needs regarding the xarelto.

## 2012-12-18 NOTE — Telephone Encounter (Signed)
Refill sent for metoprolol.  

## 2012-12-18 NOTE — Telephone Encounter (Signed)
LMOM to call back regarding the xarelto.

## 2012-12-18 NOTE — Telephone Encounter (Signed)
Notified [name] (daughter) there is a form that will be faxed to our office regarding patient assistance.

## 2013-01-01 ENCOUNTER — Telehealth: Payer: Self-pay | Admitting: Internal Medicine

## 2013-01-01 NOTE — Telephone Encounter (Signed)
LMOM to have [facility] call regarding patient assistance form.

## 2013-01-07 ENCOUNTER — Telehealth: Payer: Self-pay | Admitting: *Deleted

## 2013-01-07 NOTE — Telephone Encounter (Signed)
Please call [name] re: forms for Rx cost reducton.

## 2013-01-08 ENCOUNTER — Encounter: Payer: Self-pay | Admitting: Family Medicine

## 2013-01-08 NOTE — Telephone Encounter (Signed)
LMOM to call regarding patient assistance.

## 2013-01-21 ENCOUNTER — Other Ambulatory Visit: Payer: Self-pay | Admitting: *Deleted

## 2013-01-31 ENCOUNTER — Ambulatory Visit (INDEPENDENT_AMBULATORY_CARE_PROVIDER_SITE_OTHER): Payer: PRIVATE HEALTH INSURANCE | Admitting: Family Medicine

## 2013-01-31 ENCOUNTER — Encounter: Payer: Self-pay | Admitting: Family Medicine

## 2013-01-31 VITALS — BP 102/58 | HR 76 | Temp 97.5°F | Wt 190.5 lb

## 2013-01-31 DIAGNOSIS — I10 Essential (primary) hypertension: Secondary | ICD-10-CM

## 2013-01-31 DIAGNOSIS — R0602 Shortness of breath: Secondary | ICD-10-CM

## 2013-01-31 DIAGNOSIS — I5032 Chronic diastolic (congestive) heart failure: Secondary | ICD-10-CM

## 2013-01-31 DIAGNOSIS — D509 Iron deficiency anemia, unspecified: Secondary | ICD-10-CM

## 2013-01-31 DIAGNOSIS — E119 Type 2 diabetes mellitus without complications: Secondary | ICD-10-CM

## 2013-01-31 MED ORDER — SPIRONOLACTONE 25 MG PO TABS: 12.5000 mg | ORAL_TABLET | Freq: Every day | ORAL | Status: DC | PRN

## 2013-01-31 MED ORDER — TORSEMIDE 20 MG PO TABS: 40.0000 mg | ORAL_TABLET | Freq: Two times a day (BID) | ORAL | Status: DC | PRN

## 2013-01-31 MED ORDER — FERROUS GLUCONATE 325 MG PO TABS: 325.0000 mg | ORAL_TABLET | ORAL | Status: DC

## 2013-01-31 MED ORDER — POTASSIUM CHLORIDE CRYS ER 10 MEQ PO TBCR: 10.0000 meq | EXTENDED_RELEASE_TABLET | Freq: Every day | ORAL | Status: DC | PRN

## 2013-01-31 NOTE — Patient Instructions (Addendum)
Go to the lab on the way out.  We'll contact you with your lab report. I would get a flu shot each fall.    Goal O2 sat is 90-92%.  Should not be at 100% with O2 supplementation. If low O2 sat noted on finger sensor, check on forehead.  If questions, please call my clinic.  [phone].

## 2013-02-01 ENCOUNTER — Encounter: Payer: Self-pay | Admitting: Family Medicine

## 2013-02-01 LAB — CBC WITH DIFFERENTIAL/PLATELET
Basophils Absolute: 0 10*3/uL (ref 0.0–0.1)
Eosinophils Absolute: 0.1 10*3/uL (ref 0.0–0.7)
HCT: 38.3 % — ABNORMAL LOW (ref 39.0–52.0)
Hemoglobin: 11.9 g/dL — ABNORMAL LOW (ref 13.0–17.0)
Lymphs Abs: 0.7 10*3/uL (ref 0.7–4.0)
MCHC: 30.9 g/dL (ref 30.0–36.0)
Neutro Abs: 3.8 10*3/uL (ref 1.4–7.7)
Platelets: 182 10*3/uL (ref 150.0–400.0)
RDW: 15.1 % — ABNORMAL HIGH (ref 11.5–14.6)

## 2013-02-01 LAB — IBC PANEL: Iron: 28 ug/dL — ABNORMAL LOW (ref 42–165)

## 2013-02-01 LAB — COMPREHENSIVE METABOLIC PANEL
ALT: 15 U/L (ref 0–53)
AST: 26 U/L (ref 0–37)
Calcium: 9.3 mg/dL (ref 8.4–10.5)
Chloride: 96 mEq/L (ref 96–112)
Creatinine, Ser: 1.3 mg/dL (ref 0.4–1.5)
Sodium: 138 mEq/L (ref 135–145)
Total Protein: 6.5 g/dL (ref 6.0–8.3)

## 2013-02-01 LAB — BRAIN NATRIURETIC PEPTIDE: Pro B Natriuretic peptide (BNP): 234 pg/mL — ABNORMAL HIGH (ref 0.0–100.0)

## 2013-02-01 NOTE — Assessment & Plan Note (Signed)
We discussed his recent events in detail.  He feels well, using demadex/K/spironolactone based on weight.  He takes them together, as needed.  He has some L nipple pain and enlargement, likely due to spironolactone, wouldn't change meds at this point.    He has been at pulm rehab.  Discussed his O2 sat goals for that.  Off O2 today.  Using it at night and prn.  See instructions.    Living alone.  Feels well.  Avoiding salt.  Less edema in BLE in AM, more as the day goes on. He had f/u with neuro and was thought not to have neuromuscular junction abnormality, dystropy per patient report.  Compliant with meds.  Weigh is stable.  Still with frequent gas from lactulose and occ Gi upset from iron.  Will change to 20ml of lactulose and change iron to MWF.  He'll report back as needed.   Mentation is clear and at baseline.    This is the best I've seen him in years.  Discussed.  Recheck later in 2014.  Labs done for surveillance/baseline today.

## 2013-02-01 NOTE — Progress Notes (Signed)
We discussed his recent events in detail.  He feels well, using demadex/K/spironolactone based on weight.  He takes them together, as needed.  He has some L nipple pain and enlargement, likely due to spironolactone.  He has been at pulm rehab.  Discussed his O2 sat goals for that.  Off O2 today.  Using it at night and prn.  Living alone.  Feels well.  Avoiding salt.  Less edema in BLE in AM, more as the day goes on. He had f/u with neuro and was thought not to have neuromuscular junction abnormality, dystropy per patient report.  Compliant with meds.  Weigh is stable.  Still with frequent gas from lactulose and occ Gi upset from iron.  Mentation is clear and at baseline.    PMH and SH reviewed  ROS: See HPI, otherwise noncontributory.  Meds, vitals, and allergies reviewed.   nad ncat Mmm IRR ctab except for scant crackles at the baseline abd soft Ext with 2-3+ edema Mentation is sharp, normal converstation L nipple slightly sore with inc in tissue deep to the nipple.  No discharge.

## 2013-02-05 ENCOUNTER — Ambulatory Visit (INDEPENDENT_AMBULATORY_CARE_PROVIDER_SITE_OTHER): Payer: PRIVATE HEALTH INSURANCE | Admitting: Cardiovascular Disease

## 2013-02-05 ENCOUNTER — Encounter: Payer: Self-pay | Admitting: Cardiovascular Disease

## 2013-02-05 VITALS — BP 100/64 | HR 88 | Ht 70.0 in | Wt 186.2 lb

## 2013-02-05 DIAGNOSIS — R609 Edema, unspecified: Secondary | ICD-10-CM

## 2013-02-05 DIAGNOSIS — I2510 Atherosclerotic heart disease of native coronary artery without angina pectoris: Secondary | ICD-10-CM

## 2013-02-05 DIAGNOSIS — I10 Essential (primary) hypertension: Secondary | ICD-10-CM

## 2013-02-05 DIAGNOSIS — I4891 Unspecified atrial fibrillation: Secondary | ICD-10-CM

## 2013-02-05 DIAGNOSIS — I5031 Acute diastolic (congestive) heart failure: Secondary | ICD-10-CM

## 2013-02-05 DIAGNOSIS — E872 Acidosis: Secondary | ICD-10-CM

## 2013-02-05 DIAGNOSIS — D649 Anemia, unspecified: Secondary | ICD-10-CM

## 2013-02-05 DIAGNOSIS — I509 Heart failure, unspecified: Secondary | ICD-10-CM

## 2013-02-05 NOTE — Patient Instructions (Addendum)
Please take torsemide daily for one week the every other day Goal weight 175 pounds  We will contact hematology for iron infusion Ask the pharmacy about eliquis price We can check about assistance  Please call us if you have new issues that need to be addressed before your next appt.  Your physician wants you to follow-up in :  2 weeks

## 2013-02-05 NOTE — Progress Notes (Signed)
Patient ID: Christian [name], male    DOB: 1940-12-16, 72 y.o.   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   h/o profound esophagitis, weight loss of more than 50 pounds, anorexia, lack of interest, food getting stuck in his throat, difficulty swallowing dry mouth, periods of hallucinations and memory problems.  hospital at [facility] September 02 2011 following an EGD that showed severe esophagitis, moderate gastritis,.  cardioversion  for atrial fibrillation which was successful though in followup he converted back to atrial fibrillation. asymptomatic in June 2013 when seen in clinic while in atrial fibrillation .   Several admissions to the hospital for failure to thrive, leg weakness, mental status changes, hypoxia, hypercapnic respiratory distress.   Previous hospital discharge weight of 183 pounds. When last seen in clinic, weight was up and torsemide dosing was increased . He does not like torsemide as it interrupts his day .  Family presents with him today and reports his legs are more swollen over the past several weeks. He's not having a good day today, was better last week. He is slower today. He goes to the gym 3 days per week . Now living by himself, cooking for himself . Family is worried that he is sliding backwards and  "at the edge of the cliff again "  He reports that he is using his BiPAP religiously. Recent weight at home by the report was 181 pounds. 186 pounds in the clinic. He is taking torsemide once every 3 days. Family is bothered by the cost of Xarelto. She's not wearing his compression hose today. Family reports that they  are wearing out need a new pair. Uncertain if he is wearing these daily in his apartment  Recent iron studies showed low iron. Family is concerned and would like follow up with hematology. There also is very happy with current [facility] [address] and would like to change to [employer] [facility] pulmonary.  Previous  Echocardiogram showed normal LV systolic function greater than 55%, borderline elevated right ventricular systolic pressures pulmonic aortic valve stenosis. and   EKG : Atrial fibrillation with ventricular rate 88 beats per minute, nonspecific ST abnormality , PVCs   Outpatient Encounter Prescriptions as of 02/05/2013  Medication Sig Dispense Refill  . acetaminophen (TYLENOL) 500 MG tablet Take 500 mg by mouth every 6 (six) hours as needed.      . dronedarone (MULTAQ) 400 MG tablet Take 1 tablet (400 mg total) by mouth 2 (two) times daily with a meal.  60 tablet  6  . ferrous gluconate (FERGON) 325 MG tablet Take 1 tablet (325 mg total) by mouth every Monday, Wednesday, and Friday.      . lactulose, encephalopathy, (CHRONULAC) 10 GM/15ML SOLN Take 20 ml by mouth 2 times a week.  May give 3 doses per week if needed.      . metoprolol tartrate (LOPRESSOR) 25 MG tablet Take 1 tablet (25 mg total) by mouth 2 (two) times daily.  60 tablet  3  . midodrine (PROAMATINE) 2.5 MG tablet Take 1 tablet (2.5 mg total) by mouth 3 (three) times daily as needed.  90 tablet  6  . nitroGLYCERIN (NITROSTAT) 0.4 MG SL tablet Place 0.4 mg under the tongue every 5 (five) minutes as needed.        . NON FORMULARY Oxygen @ 2 liters at bedtime.      . Nutritional Supplements (FEEDING SUPPLEMENT, GLUCERNA 1.2 CAL,) LIQD Take 237 mLs by mouth daily as needed.      Marland Kitchen omeprazole (PRILOSEC) 20 MG capsule Take 1 capsule (20 mg total) by mouth daily.  90 capsule  3  . polyethylene glycol powder (GLYCOLAX/MIRALAX) powder Take 17 g by mouth daily.  255 g  3  . potassium chloride  (K-DUR,KLOR-CON) 10 MEQ tablet Take 1 tablet (10 mEq total) by mouth daily as needed.  90 tablet  3  . Rivaroxaban (XARELTO) 20 MG TABS Take 1 tablet (20 mg total) by mouth daily.  30 tablet  3  . spironolactone (ALDACTONE) 25 MG tablet Take 0.5-1 tablets (12.5-25 mg total) by mouth daily as needed.  90 tablet  3  . tamsulosin (FLOMAX) 0.4 MG CAPS Take 1 capsule (0.4 mg total) by mouth daily after supper.  90 capsule  3  . torsemide (DEMADEX) 20 MG tablet Take 2 tablets (40 mg total) by mouth 2 (two) times daily as needed.  180 tablet  3  . [DISCONTINUED] modafinil (PROVIGIL) 200 MG tablet Take 200 mg by mouth daily.        No facility-administered encounter medications on file as of 02/05/2013.    Review of Systems  Constitutional: Negative.   HENT: Negative.   Eyes: Negative.   Respiratory: Negative.   Cardiovascular: Positive for leg swelling.  Gastrointestinal: Negative.   Musculoskeletal: Negative.   Skin: Negative.   Neurological: Negative.   Psychiatric/Behavioral: Positive for decreased concentration.  All other systems reviewed and are negative.    BP 100/64  Pulse 88  Ht 5\' 10"  (1.778 m)  Wt 186 lb 4 oz (84.482 kg)  BMI 26.72 kg/m2  Physical Exam  Nursing note and vitals reviewed. Constitutional: He is oriented to person, place, and time. He appears well-developed and well-nourished.  HENT:  Head: Normocephalic.  Nose: Nose normal.  Mouth/Throat: Oropharynx is clear and moist.  Eyes: Conjunctivae are normal. Pupils are equal, round, and reactive to light.  Neck: Normal range of motion. Neck supple. No JVD present.  Cardiovascular: Normal rate, S1 normal, S2 normal and intact distal pulses.  An irregularly irregular rhythm present. Exam reveals no gallop and no friction rub.   Murmur heard.  Crescendo systolic murmur is present with a grade of 2/6   2+ pitting edema bilaterally, not wearing his compression hose  Pulmonary/Chest: Effort normal and breath sounds  normal. No respiratory distress. He has no wheezes. He has no rales. He exhibits no tenderness.  Abdominal: Soft. Bowel sounds are normal. He exhibits no distension. There is no tenderness.  Musculoskeletal: Normal range of motion. He exhibits no edema and no tenderness.  Lymphadenopathy:    He has no cervical adenopathy.  Neurological: He is alert and oriented to person, place, and time. Coordination normal.  Skin: Skin is warm and dry. No rash noted. No erythema.  Psychiatric: He has a normal mood and affect. His behavior is normal. Judgment and thought content normal.      Assessment and Plan

## 2013-02-05 NOTE — Assessment & Plan Note (Addendum)
Leg edema is significant on today's visit, hard, pitting edema bilaterally. Family has noticed increased leg edema. He is living by himself and I'm concerned about his poor diet. Weight has not shown much change but I don't think he is eating well. Suspect his weight should be 5 or possibly 10 pounds lower than it currently is after diuresis. Encouraged him to take torsemide daily. This was a big concern for him as he does not like urinating frequently. His weight continues to trend upwards, only torsemide 40 mg twice a day for a short period of time. Family presents with him today and is aware that he is retaining again and that he needs to take torsemide daily until symptoms improve.

## 2013-02-05 NOTE — Assessment & Plan Note (Signed)
Encouraged him to stay on his BiPAP. We'll try to aggressively diureses him as in the past, diastolic CHF has led to complicated hospital admissions.

## 2013-02-05 NOTE — Assessment & Plan Note (Signed)
Currently with no symptoms of angina. No further workup at this time. Continue current medication regimen. 

## 2013-02-05 NOTE — Assessment & Plan Note (Signed)
I suspect edema is multifactorial. Some component of lymphedema with significant diastolic CHF on today's visit.

## 2013-02-05 NOTE — Assessment & Plan Note (Signed)
Family is concerned about iron deficiency and requesting iron infusion. Previously seen by [facility] hematology. They're asking for referral back there for possible iron infusion as he seemed to do well after this in the past. He is unable to tolerate by mouth iron.

## 2013-02-08 ENCOUNTER — Encounter: Payer: Self-pay | Admitting: Family Medicine

## 2013-02-08 ENCOUNTER — Telehealth: Payer: Self-pay

## 2013-02-08 NOTE — Telephone Encounter (Signed)
Per AVS instructions pt needed appt with Dr Sherrlyn Hock at [facility] hematology/oncology appt made for 02/19/13 at 9am.  Zenaida Niece will pick him up at 8:15am.  Attempted to call and notify daughter of appt date and time.  LMTCB  Also pt needs sleep consult appt.  Pt has seen Dr Kendrick Fries in the past in [place] for sleep.  Dr Kendrick Fries said pt could see him again if he did not want to travel to [facility], but may benefit more from seeing one of the board certified sleep MD's in [facility] Dr Shelle Iron or Dr Maple Hudson.  Need to find out pt preference and schedule appt.

## 2013-02-12 NOTE — Telephone Encounter (Signed)
Advised of sleep consult recommendations per DR Mariah Milling.  Pt's daughter is agreeable to see on of the board certified sleep docs in [place] for consultation.  Made appt with Dr Shelle Iron on 03/19/13 at 3pm.  Called pt's daughter [name] advised of appt date and time.

## 2013-02-12 NOTE — Telephone Encounter (Signed)
Pt's daughter called back advised of Dr Sherrlyn Hock appt.  Aware of appt date and time.

## 2013-02-18 ENCOUNTER — Ambulatory Visit: Payer: Self-pay | Admitting: Internal Medicine

## 2013-02-19 ENCOUNTER — Ambulatory Visit: Payer: Self-pay | Admitting: Internal Medicine

## 2013-02-27 ENCOUNTER — Encounter: Payer: Self-pay | Admitting: Cardiovascular Disease

## 2013-02-27 ENCOUNTER — Ambulatory Visit (INDEPENDENT_AMBULATORY_CARE_PROVIDER_SITE_OTHER): Payer: PRIVATE HEALTH INSURANCE | Admitting: Cardiovascular Disease

## 2013-02-27 VITALS — BP 105/60 | HR 99 | Ht 69.0 in | Wt 180.8 lb

## 2013-02-27 DIAGNOSIS — J9610 Chronic respiratory failure, unspecified whether with hypoxia or hypercapnia: Secondary | ICD-10-CM

## 2013-02-27 DIAGNOSIS — I2510 Atherosclerotic heart disease of native coronary artery without angina pectoris: Secondary | ICD-10-CM

## 2013-02-27 DIAGNOSIS — I5032 Chronic diastolic (congestive) heart failure: Secondary | ICD-10-CM

## 2013-02-27 DIAGNOSIS — D649 Anemia, unspecified: Secondary | ICD-10-CM

## 2013-02-27 DIAGNOSIS — R609 Edema, unspecified: Secondary | ICD-10-CM

## 2013-02-27 DIAGNOSIS — I4891 Unspecified atrial fibrillation: Secondary | ICD-10-CM

## 2013-02-27 NOTE — Patient Instructions (Addendum)
Please take torsemide every other day  Goal weight 175 pounds   Take miralex every other day  Please take midodrine 2.5 mg twice a day for low blood pressures  Eat a snack in the AM for tremor (?low blood sugar)  Please call us if you have new issues that need to be addressed before your next appt.  Your physician wants you to follow-up in : 1 month

## 2013-02-27 NOTE — Assessment & Plan Note (Signed)
Recommended he continue his diuretic every other day. Recent 6 pound weight loss. Goal weight 175 pounds

## 2013-02-27 NOTE — Assessment & Plan Note (Signed)
Currently with no symptoms of angina. No further workup at this time. Continue current medication regimen. 

## 2013-02-27 NOTE — Assessment & Plan Note (Signed)
Heart rate is relatively well controlled. We'll continue current regimen

## 2013-02-27 NOTE — Progress Notes (Signed)
Patient ID: Christian [name], male    DOB: 1941/01/26, 72 y.o.   MRN: [id number]  HPI Comments: 34 -year-old gentleman with a history of coronary artery disease, CABG x3 at [facility] in 2009, diabetes, history of DVT on the left with chronic lower extremity edema, catheterization in March 2009 showing patent grafts with elevated pulmonary pressures who developed atrial fibrillation at the beginning of the summer 2011, cardioversion 04/20/2010 who maintained NSR, Until 2012.   h/o profound esophagitis, weight loss of more than 50 pounds, anorexia, lack of interest, food getting stuck in his throat, difficulty swallowing dry mouth, periods of hallucinations and memory problems.  hospital at [facility] September 02 2011 following an EGD that showed severe esophagitis, moderate gastritis,.  cardioversion  for atrial fibrillation which was successful though in followup he converted back to atrial fibrillation. asymptomatic in June 2013 when seen in clinic while in atrial fibrillation .   Several admissions to the hospital for failure to thrive, leg weakness, mental status changes, hypoxia, hypercapnic respiratory distress.   Previous hospital discharge weight of 183 pounds. When last seen in clinic, weight was up and torsemide dosing was increased . He does not like torsemide as it interrupts his day .  Family presents with him today. He is doing much better than his last clinic visit. His weight is down 6 pounds, he is taking a diuretic every other day with potassium and spironolactone. He has had an iron infusion after evaluation by Dr. Sherrlyn Hock. He has an appointment with pulmonary for his structures sleep apnea, hypercapnia, poor ventilatory rate (frequent admissions marked with hypercapnic respiratory distress, encephalopathy that appears to be multifactorial). He's not eating well, family is concerned about poor dietary habits. He has a tremor in the morning with weakness that seems to get better after eating and  later in the day. We offered midodrine on his last clinic visit for low blood pressure. He has not started this.  Previous  Echocardiogram showed normal LV systolic function greater than 55%, borderline elevated right ventricular systolic pressures pulmonic aortic valve stenosis. and   EKG : Atrial fibrillation with ventricular rate 99 beats per minute, nonspecific ST abnormality , PVCs   Outpatient Encounter Prescriptions as of 02/27/2013  Medication Sig Dispense Refill  . acetaminophen (TYLENOL) 500 MG tablet Take 500 mg by mouth every 6 (six) hours as needed.      . dronedarone (MULTAQ) 400 MG tablet Take 1 tablet (400 mg total) by mouth 2 (two) times daily with a meal.  60 tablet  6  . lactulose, encephalopathy, (CHRONULAC) 10 GM/15ML SOLN Take 20 ml by mouth 2 times a week.  May give 3 doses per week if needed.      . metoprolol tartrate (LOPRESSOR) 25 MG tablet Take 1 tablet (25 mg total) by mouth 2 (two) times daily.  60 tablet  3  . midodrine (PROAMATINE) 2.5 MG tablet Take 1 tablet (2.5 mg total) by mouth 3 (three) times daily as needed.  90 tablet  6  . nitroGLYCERIN (NITROSTAT) 0.4 MG SL tablet Place 0.4 mg under the tongue every 5 (five) minutes as needed.        . NON FORMULARY Oxygen @ 2 liters at bedtime.      . Nutritional Supplements (FEEDING SUPPLEMENT, GLUCERNA 1.2 CAL,) LIQD Take 237 mLs by mouth daily as needed.      Marland Kitchen omeprazole (PRILOSEC) 20 MG capsule Take 1 capsule (20 mg total) by mouth daily.  90 capsule  3  . polyethylene glycol powder (GLYCOLAX/MIRALAX) powder Take 17 g by mouth daily.  255 g  3  . potassium chloride (K-DUR,KLOR-CON) 10 MEQ tablet Take 1 tablet (10 mEq total) by mouth daily as needed.  90 tablet  3  . Rivaroxaban (XARELTO) 20 MG TABS Take 1 tablet (20 mg total) by mouth daily.  30 tablet  3  . spironolactone (ALDACTONE) 25 MG tablet Take 0.5-1 tablets (12.5-25 mg total) by mouth daily as needed.  90 tablet  3  . tamsulosin (FLOMAX) 0.4 MG CAPS Take 1  capsule (0.4 mg total) by mouth daily after supper.  90 capsule  3  . torsemide (DEMADEX) 20 MG tablet Take 2 tablets (40 mg total) by mouth 2 (two) times daily as needed.  180 tablet  3  . ferrous gluconate (FERGON) 325 MG tablet Take 325 mg by mouth as directed.       Review of Systems  Constitutional: Negative.   HENT: Negative.   Eyes: Negative.   Respiratory: Negative.   Cardiovascular: Positive for leg swelling.  Gastrointestinal: Negative.   Musculoskeletal: Negative.   Skin: Negative.   Neurological: Negative.   Psychiatric/Behavioral: Positive for decreased concentration.  All other systems reviewed and are negative.    BP 105/60  Pulse 99  Ht 5\' 9"  (1.753 m)  Wt 180 lb 12 oz (81.988 kg)  BMI 26.68 kg/m2  Physical Exam  Nursing note and vitals reviewed. Constitutional: He is oriented to person, place, and time. He appears well-developed and well-nourished.  HENT:  Head: Normocephalic.  Nose: Nose normal.  Mouth/Throat: Oropharynx is clear and moist.  Eyes: Conjunctivae are normal. Pupils are equal, round, and reactive to light.  Neck: Normal range of motion. Neck supple. No JVD present.  Cardiovascular: Normal rate, S1 normal, S2 normal and intact distal pulses.  An irregularly irregular rhythm present. Exam reveals no gallop and no friction rub.   Murmur heard.  Crescendo systolic murmur is present with a grade of 2/6   1+ pitting edema bilaterally, not wearing his compression hose  Pulmonary/Chest: Effort normal and breath sounds normal. No respiratory distress. He has no wheezes. He has no rales. He exhibits no tenderness.  Abdominal: Soft. Bowel sounds are normal. He exhibits no distension. There is no tenderness.  Musculoskeletal: Normal range of motion. He exhibits no edema and no tenderness.  Lymphadenopathy:    He has no cervical adenopathy.  Neurological: He is alert and oriented to person, place, and time. Coordination normal.  Skin: Skin is warm and  dry. No rash noted. No erythema.  Psychiatric: He has a normal mood and affect. His behavior is normal. Judgment and thought content normal.      Assessment and Plan

## 2013-02-27 NOTE — Assessment & Plan Note (Signed)
Improvement of his edema in the past several weeks with 6 pound weight loss. We'll continue diuretic every other day.

## 2013-02-27 NOTE — Assessment & Plan Note (Signed)
Recently seen by Dr. Sherrlyn Hock. S/p iron. infusion

## 2013-02-27 NOTE — Assessment & Plan Note (Signed)
He has tremor and lethargy in the mornings. Etiology uncertain but concerning for hypercapnia. Uncertain if he needs a backup respiratory rate in addition to BiPAP.

## 2013-03-05 LAB — OCCULT BLOOD X 1 CARD TO LAB, STOOL: Occult Blood, Feces: NEGATIVE

## 2013-03-08 ENCOUNTER — Telehealth: Payer: Self-pay | Admitting: *Deleted

## 2013-03-08 NOTE — Telephone Encounter (Signed)
Pt's son to pick up samples of Multaq 400mg  tabs 4 packets of 6 tablets.

## 2013-03-08 NOTE — Telephone Encounter (Signed)
Patients son called for sample for Multaq 400mg 

## 2013-03-11 ENCOUNTER — Encounter: Payer: Self-pay | Admitting: Family Medicine

## 2013-03-11 ENCOUNTER — Ambulatory Visit: Payer: Self-pay | Admitting: Internal Medicine

## 2013-03-19 ENCOUNTER — Ambulatory Visit (INDEPENDENT_AMBULATORY_CARE_PROVIDER_SITE_OTHER): Payer: PRIVATE HEALTH INSURANCE | Admitting: Pulmonary Disease

## 2013-03-19 ENCOUNTER — Encounter: Payer: Self-pay | Admitting: Pulmonary Disease

## 2013-03-19 ENCOUNTER — Telehealth: Payer: Self-pay

## 2013-03-19 VITALS — BP 126/72 | HR 90 | Temp 97.6°F | Ht 70.0 in | Wt 183.2 lb

## 2013-03-19 DIAGNOSIS — G4733 Obstructive sleep apnea (adult) (pediatric): Secondary | ICD-10-CM

## 2013-03-19 DIAGNOSIS — R0689 Other abnormalities of breathing: Secondary | ICD-10-CM | POA: Insufficient documentation

## 2013-03-19 DIAGNOSIS — R0609 Other forms of dyspnea: Secondary | ICD-10-CM

## 2013-03-19 NOTE — Patient Instructions (Addendum)
Will get a download off your current machine, and get some idea what settings and type of device you are on.  Will call you once I get the results. We may need to do a followup sleep study, especially with your significant weight loss.

## 2013-03-19 NOTE — Progress Notes (Signed)
Subjective:    Patient ID: Christian [name], male    DOB: Aug 15, 1940, 72 y.o.   MRN: [id number]  HPI The patient is a 72 year old male who I've been asked to see for management of obstructive sleep apnea.  The patient's history is somewhat complicated because of superimposed hypercarbia of unknown origin.  He is not morbidly obese, and is not felt to have intrinsic parenchymal lung disease.  He had a sleep study in 2011 which showed moderate OSA, with an AHI of 31 events per hour.  He was placed on CPAP as part of a titration study, and then change to bilevel because of poor tolerance.  He was titrated to a very high bilevel pressures but continued to have breakthrough.  The report mentions the possibility of Cheyne-Stokes respirations.  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  He did not feel this helped him, and apparently he is on bilevel at this time.  However, the patient does not know his device or his settings.  He has been wearing the device compliantly with a full face mask and a heated humidifier.  It is unknown whether he snores through the device, but he awakens every one to 2 hours that he blames on nocturia.  He is not rested in the mornings upon arising.  The patient states that he always sleeps poorly when he is "volume overloaded".  He notes significant daytime sleepiness with any period of inactivity.  He states that his weight has actually gone down 60 pounds over the last 2 years, and his Epworth score today is abnormal at 15.  He currently wears oxygen with his bilevel device.  He has also been noted to have hypercarbia on an arterial blood gas last year, and his serum bicarbonate this summer is 38.  He has some proximal muscle weakness primarily in his lower extremities, but has had a complete neuro evaluation according to his daughter.  She describes EMGs and NCVs.  He was told that he did not have a neuromuscular disease.    Sleep Questionnaire What  time do you typically go to bed?( Between what hours) 10-11p 10-11p at 1537 on 03/19/13 by Maisie Fus, CMA How long does it take you to fall asleep? 10-33mins 10-17mins at 1537 on 03/19/13 by Maisie Fus, CMA How many times during the night do you wake up? 3 3 at 1537 on 03/19/13 by Maisie Fus, CMA What time do you get out of bed to start your day? 0800 0800 at 1537 on 03/19/13 by Maisie Fus, CMA Do you drive or operate heavy machinery in your occupation? No No at 1537 on 03/19/13 by Maisie Fus, CMA How much has your weight changed (up or down) over the past two years? (In pounds) 60 lb (27.216 kg)60 lb (27.216 kg) decrease at 1537 on 03/19/13 by Maisie Fus, CMA Have you ever had a sleep study before? Yes Yes at 1537 on 03/19/13 by Maisie Fus, CMA If yes, location of [facility] at 1537 on 03/19/13 by Maisie Fus, CMA If yes, date of study? 2009? 2009? at 1537 on 03/19/13 by Maisie Fus, CMA Do you currently use CPAP? Yes Yes at 1537 on 03/19/13 by Maisie Fus, CMA If so, what pressure? BIPAP// AUTO pressure BIPAP// AUTO pressure at 1537 on 03/19/13 by Maisie Fus, CMA Do you wear oxygen at any time? Yes Yes  at 1537 on 03/19/13 by Maisie Fus, CMA O2 Flow Rate (L/min) 2 L/min2 L/min at night with BIPAP at 1537 on 03/19/13 by Maisie Fus, CMA   Review of Systems  Constitutional: Positive for appetite change and unexpected weight change. Negative for fever.  HENT: Negative for ear pain, nosebleeds, congestion, sore throat, rhinorrhea, sneezing, trouble swallowing, dental problem, postnasal drip and sinus pressure.   Eyes: Negative for redness and itching.  Respiratory: Positive for shortness of breath. Negative for cough, chest tightness and wheezing.   Cardiovascular: Positive for palpitations ( irregular heartbeats). Negative for leg swelling.  Gastrointestinal: Negative for  nausea and vomiting.  Genitourinary: Negative for dysuria.  Musculoskeletal: Positive for joint swelling ( hand swelling) and arthralgias.  Skin: Negative for rash.  Neurological: Negative for headaches.  Hematological: Does not bruise/bleed easily.  Psychiatric/Behavioral: Negative for dysphoric mood. The patient is not nervous/anxious.        Objective:   Physical Exam Constitutional:  Thin male, no acute distress  HENT:  Nares patent without discharge  Oropharynx without exudate, palate and uvula are normal  Eyes:  Perrla, eomi, no scleral icterus  Neck:  No JVD, no TMG  Cardiovascular:  Normal rate, regular rhythm, no rubs or gallops.  3/6 sem        Intact distal pulses but diminished.  Pulmonary :  Decreased depth inspiration, no stridor or respiratory distress   No rales, rhonchi, or wheezing  Abdominal:  Soft, nondistended, bowel sounds present.  No tenderness noted.   Musculoskeletal:  minimal lower extremity edema noted.  Lymph Nodes:  No cervical lymphadenopathy noted  Skin:  No cyanosis noted  Neurologic:  Alert, appropriate, moves all 4 extremities without obvious deficit.         Assessment & Plan:

## 2013-03-19 NOTE — Telephone Encounter (Signed)
Pt daughter has a question regarding Xarelto, patient assitance. Wants to know if she should mail the packet in, or do you do that. Please advise

## 2013-03-19 NOTE — Assessment & Plan Note (Signed)
The patient has a history of sleep apnea dating back to the 1990s, and his last study in 2011 showed an AHI of 31 events per hour.  However, he has lost 60 pounds by his history over the last 2 years.  It is unclear whether he still has obstructive sleep apnea, and how much superimposed Central apnea or Cheyne-Stokes respirations that he may actually have.  I would like to find out what kind of device he is on, and his actual pressures.  Ultimately, I think he is going to need a new sleep study so that I can make sense out of all of this.

## 2013-03-19 NOTE — Assessment & Plan Note (Signed)
The patient has underlying hypercarbia, but it is unclear whether he has intrinsic lung disease.  His chest x-ray shows prominent interstitial markings that are more suggestive of edema, and he does not have crackles on exam today.  He has not had a CT chest.  He obviously does not have COPD by his pulmonary function studies, and he is not morbidly obese.  It is unclear whether his hypercarbia may be from central hypoventilation given his sleep study results, or whether it may be related to restrictive lung disease from an undiagnosed neuromuscular issue?  He clearly has a very shallow depth of inspiration during my exam today.  If he is felt to have hypoventilation related to a central drive issue or due to restriction, he may benefit more from an IVAPS device, which guarantees a minute ventilation throughout the whole night.

## 2013-03-20 NOTE — Telephone Encounter (Signed)
LMOM TCB regarding patient.

## 2013-03-22 ENCOUNTER — Telehealth: Payer: Self-pay | Admitting: *Deleted

## 2013-03-22 NOTE — Telephone Encounter (Signed)
Records requested from [facility] received.  In your Nathin Saran folder for review.

## 2013-03-25 ENCOUNTER — Encounter: Payer: Self-pay | Admitting: Pulmonary Disease

## 2013-03-26 NOTE — Telephone Encounter (Signed)
LMOM to call back regarding patient assistance for xarelto.

## 2013-03-27 NOTE — Telephone Encounter (Signed)
Spoke with [name] (Zackarey's daughter) that the information of xarelto was faxed.

## 2013-03-28 NOTE — Telephone Encounter (Signed)
Spoke with [name] (daughter) told needs to have another form filled out for a refill on Multaq with the patient assistance program. Told the patient we have some samples of multaq coming from [place]. Told [name] that still waiting for a phone call back on the xarelto patient assistance. We do have samples of xarelto 20 until we here from the patient assistance.

## 2013-03-29 ENCOUNTER — Telehealth: Payer: Self-pay

## 2013-03-29 MED ORDER — DRONEDARONE HCL 400 MG PO TABS: 400.0000 mg | ORAL_TABLET | Freq: Two times a day (BID) | ORAL | Status: DC

## 2013-03-29 NOTE — Telephone Encounter (Signed)
One weeks worth

## 2013-03-29 NOTE — Telephone Encounter (Signed)
Pt's daughter requesting Multaq refill until able to receive samples. Sent in refill for Multaq #14 R#1 to [facility] pharmacy.

## 2013-04-02 NOTE — Telephone Encounter (Signed)
LMOM TCB regarding patient assistance for xarelto; received a letter stating that Christian [name] is not qualified for xarelto patient assistance.

## 2013-04-03 ENCOUNTER — Ambulatory Visit (INDEPENDENT_AMBULATORY_CARE_PROVIDER_SITE_OTHER): Payer: PRIVATE HEALTH INSURANCE | Admitting: Cardiovascular Disease

## 2013-04-03 ENCOUNTER — Encounter: Payer: Self-pay | Admitting: Cardiovascular Disease

## 2013-04-03 VITALS — BP 100/72 | HR 91 | Ht 70.0 in | Wt 180.0 lb

## 2013-04-03 DIAGNOSIS — I951 Orthostatic hypotension: Secondary | ICD-10-CM

## 2013-04-03 DIAGNOSIS — R0602 Shortness of breath: Secondary | ICD-10-CM

## 2013-04-03 DIAGNOSIS — I5032 Chronic diastolic (congestive) heart failure: Secondary | ICD-10-CM

## 2013-04-03 DIAGNOSIS — I2510 Atherosclerotic heart disease of native coronary artery without angina pectoris: Secondary | ICD-10-CM

## 2013-04-03 DIAGNOSIS — I4891 Unspecified atrial fibrillation: Secondary | ICD-10-CM

## 2013-04-03 MED ORDER — MIDODRINE HCL 10 MG PO TABS: 10.0000 mg | ORAL_TABLET | Freq: Three times a day (TID) | ORAL | Status: DC

## 2013-04-03 NOTE — Patient Instructions (Addendum)
You are doing well.  Please increase the midodrine to 5 to 10 mg uo to three times a day for low blood pressure, potentially take with meals  Please call us if you have new issues that need to be addressed before your next appt.  Your physician wants you to follow-up in: 3 months.  You will receive a reminder letter in the mail two months in advance. If you don't receive a letter, please call our office to schedule the follow-up appointment.

## 2013-04-03 NOTE — Assessment & Plan Note (Signed)
Blood pressure consistently runs low. We will increase his midodrine up to 5 mg 3 times a day. Asked him to monitor his blood pressure particularly in the postprandial setting. If he continues to have low pressure or drops after eating, would consider increasing midodrine up to 10 mg 3 times a day. Perhaps this might improve his cognition, balance, energy.

## 2013-04-03 NOTE — Assessment & Plan Note (Signed)
Currently with no symptoms of angina. No further workup at this time. Continue current medication regimen. 

## 2013-04-03 NOTE — Progress Notes (Signed)
Patient ID: Christian [name], male    DOB: 1940-10-15, 72 y.o.   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   h/o profound esophagitis, weight loss of more than 50 pounds, anorexia, lack of interest, food getting stuck in his throat, difficulty swallowing dry mouth, periods of hallucinations and memory problems.  hospital at [facility] September 02 2011 following an EGD that showed severe esophagitis, moderate gastritis,.  cardioversion  for atrial fibrillation which was successful though in followup he converted back to atrial fibrillation. asymptomatic in June 2013 when seen in clinic while in atrial fibrillation .   Several admissions to the hospital for failure to thrive, leg weakness, mental status changes, hypoxia, hypercapnic respiratory distress.    Family presents with him today. Overall they report that he is doing well. He has been compliant with his BiPAP. Weight is approximately the same as last month, 177 pounds at home, 180 pounds by our scale. He takes diuretic every other day, every day for weight more than 177 pounds. Continues to have mild edema but wears compression hose . His main complaint is that his balance is poor. He staggers at times from side to side . Family is reasonably happy with his stability. In the past when he lives at home, diet gets poor, heart failure becomes an issue, weight goes upwards. He has been relatively stable. He reports that he has tried midodrine 2.5 mg in the past with no improvement in his blood pressure.  Previous  Echocardiogram showed normal LV systolic function greater than 55%, borderline  elevated right ventricular systolic pressures pulmonic aortic valve stenosis. and   EKG : Atrial fibrillation with ventricular rate 91 beats per minute, nonspecific ST abnormality    Outpatient Encounter Prescriptions as of 04/03/2013  Medication Sig Dispense Refill  . acetaminophen (TYLENOL) 500 MG tablet Take 500 mg by mouth every 6 (six) hours as needed.      . dronedarone (MULTAQ) 400 MG tablet Take 1 tablet (400 mg total) by mouth 2 (two) times daily with a meal.  14 tablet  1  . lactulose, encephalopathy, (CHRONULAC) 10 GM/15ML SOLN Take 20 ml by mouth 2 times a week.  May give 3 doses per week if needed.      . metoprolol tartrate (LOPRESSOR) 25 MG tablet Take 1 tablet (25 mg total) by mouth 2 (two) times daily.  60 tablet  3  . midodrine (PROAMATINE) 10 MG tablet Take 1 tablet (10 mg total) by mouth 3 (three) times daily.  90 tablet  6  . nitroGLYCERIN (NITROSTAT) 0.4 MG SL tablet Place 0.4 mg under the tongue every 5 (five) minutes as needed.        . NON FORMULARY Oxygen @ 2 liters at bedtime.      . Nutritional Supplements (FEEDING SUPPLEMENT, GLUCERNA 1.2 CAL,) LIQD Take 237 mLs by mouth daily as needed.      Marland Kitchen omeprazole (PRILOSEC) 20 MG capsule Take 1 capsule (20 mg total) by mouth daily.  90 capsule  3  . polyethylene glycol powder (GLYCOLAX/MIRALAX) powder Take 17 g by mouth daily.  255 g  3  . potassium chloride (K-DUR,KLOR-CON) 10 MEQ tablet  Take 1 tablet (10 mEq total) by mouth daily as needed.  90 tablet  3  . Rivaroxaban (XARELTO) 20 MG TABS Take 1 tablet (20 mg total) by mouth daily.  30 tablet  3  . spironolactone (ALDACTONE) 25 MG tablet Take 0.5-1 tablets (12.5-25 mg total) by mouth daily as needed.  90 tablet  3  . tamsulosin (FLOMAX) 0.4 MG CAPS Take 1 capsule (0.4 mg total) by mouth daily after supper.  90 capsule  3  . torsemide (DEMADEX) 20 MG tablet Take 2 tablets (40 mg total) by mouth 2 (two) times daily as needed.  180 tablet  3  . [DISCONTINUED] midodrine  (PROAMATINE) 2.5 MG tablet Take 1 tablet (2.5 mg total) by mouth 3 (three) times daily as needed.  90 tablet  6   No facility-administered encounter medications on file as of 04/03/2013.    Review of Systems  Constitutional: Negative.   HENT: Negative.   Eyes: Negative.   Respiratory: Negative.   Cardiovascular: Positive for leg swelling.  Gastrointestinal: Negative.   Endocrine: Negative.   Musculoskeletal: Positive for gait problem.  Skin: Negative.   Allergic/Immunologic: Negative.   Neurological: Negative.   Hematological: Negative.   Psychiatric/Behavioral: Positive for decreased concentration.  All other systems reviewed and are negative.    BP 100/72  Pulse 91  Ht 5\' 10"  (1.778 m)  Wt 180 lb (81.647 kg)  BMI 25.83 kg/m2  Physical Exam  Nursing note and vitals reviewed. Constitutional: He is oriented to person, place, and time. He appears well-developed and well-nourished.  HENT:  Head: Normocephalic.  Nose: Nose normal.  Mouth/Throat: Oropharynx is clear and moist.  Eyes: Conjunctivae are normal. Pupils are equal, round, and reactive to light.  Neck: Normal range of motion. Neck supple. No JVD present.  Cardiovascular: Normal rate, S1 normal, S2 normal and intact distal pulses.  An irregularly irregular rhythm present. Exam reveals no gallop and no friction rub.   Murmur heard.  Crescendo systolic murmur is present with a grade of 2/6   1+ pitting edema bilaterally, not wearing his compression hose  Pulmonary/Chest: Effort normal and breath sounds normal. No respiratory distress. He has no wheezes. He has no rales. He exhibits no tenderness.  Abdominal: Soft. Bowel sounds are normal. He exhibits no distension. There is no tenderness.  Musculoskeletal: Normal range of motion. He exhibits no edema and no tenderness.  Lymphadenopathy:    He has no cervical adenopathy.  Neurological: He is alert and oriented to person, place, and time. Coordination normal.  Skin:  Skin is warm and dry. No rash noted. No erythema.  Psychiatric: He has a normal mood and affect. His behavior is normal. Judgment and thought content normal.      Assessment and Plan

## 2013-04-03 NOTE — Assessment & Plan Note (Signed)
Chronic atrial fibrillation on anticoagulation. We'll continue metoprolol and multaq. He reports that without the latter, his heart rate is elevated

## 2013-04-03 NOTE — Assessment & Plan Note (Signed)
Weight is stable from his prior clinic visit. He takes diuretic every other day, every day for any weight gain

## 2013-04-07 ENCOUNTER — Other Ambulatory Visit: Payer: Self-pay | Admitting: Cardiovascular Disease

## 2013-04-08 ENCOUNTER — Other Ambulatory Visit: Payer: Self-pay | Admitting: *Deleted

## 2013-04-08 MED ORDER — METOPROLOL TARTRATE 25 MG PO TABS: 25.0000 mg | ORAL_TABLET | Freq: Two times a day (BID) | ORAL | Status: DC

## 2013-04-08 NOTE — Telephone Encounter (Signed)
Refilled Metoprolol sent to [facility] pharmacy. 

## 2013-04-14 ENCOUNTER — Encounter: Payer: Self-pay | Admitting: Pulmonary Disease

## 2013-04-14 ENCOUNTER — Other Ambulatory Visit: Payer: Self-pay | Admitting: Family Medicine

## 2013-04-14 ENCOUNTER — Encounter: Payer: Self-pay | Admitting: Cardiovascular Disease

## 2013-04-15 ENCOUNTER — Telehealth: Payer: Self-pay | Admitting: Pulmonary Disease

## 2013-04-15 ENCOUNTER — Other Ambulatory Visit: Payer: Self-pay | Admitting: *Deleted

## 2013-04-15 NOTE — Telephone Encounter (Signed)
Please check with ashtyn and see if she has seen anything on this pt.

## 2013-04-15 NOTE — Telephone Encounter (Signed)
MyChart refill request.  Please advise.

## 2013-04-15 NOTE — Telephone Encounter (Signed)
Dr. Shelle Iron, The pt is asking for results of the download requested on 03-19-13. Have you received this information yet? Please advise. Carron Curie, CMA

## 2013-04-15 NOTE — Telephone Encounter (Signed)
This is a patient email.  [facility] had placed order back in September to get download off machine. Asthyn please advise thanks

## 2013-04-16 ENCOUNTER — Telehealth: Payer: Self-pay

## 2013-04-16 MED ORDER — MIDODRINE HCL 10 MG PO TABS: 10.0000 mg | ORAL_TABLET | Freq: Three times a day (TID) | ORAL | Status: DC

## 2013-04-16 MED ORDER — LACTULOSE ENCEPHALOPATHY 10 GM/15ML PO SOLN: ORAL | Status: DC

## 2013-04-16 NOTE — Telephone Encounter (Signed)
Sent.  Second copy of rx.

## 2013-04-16 NOTE — Telephone Encounter (Signed)
-----   Message ----- From: Keenes Cellar [place] Sent: 04/14/2013 9:53 PM To: Mickie Bail Burl Triage Subject: Visit Follow-Up Question Jasmine December, 1. Did the Multaq medication make it to the office yet? Please let me know so we can pick up. 2. Also, We didn't pick up the Midodrine (10mg x2/day) at [facility] quickly enough. Can you send in another prescription for Dad? Thank you very much! [name]

## 2013-04-16 NOTE — Telephone Encounter (Signed)
Sent!

## 2013-04-17 ENCOUNTER — Encounter: Payer: Self-pay | Admitting: Family Medicine

## 2013-04-18 ENCOUNTER — Encounter: Payer: Self-pay | Admitting: Family Medicine

## 2013-04-18 NOTE — Telephone Encounter (Signed)
Spoke with [name] (daughter) Karie Soda is ready to pick up.

## 2013-04-19 NOTE — Telephone Encounter (Signed)
Daughter has already called and set up the appt since we spoke earlier.  Pt to have D/L completed next week.

## 2013-04-19 NOTE — Telephone Encounter (Signed)
Spoke with [facility]  [place] [phone]   Per Erin--pt has not been seen there since 12/2012 and has been 'unreachable' since. Denny Peon states that she is aware of the order placed by our office but that they have been unable to reach patient.  Patient's machine: [employer] 950-P--AUTO AVS BiPAP--last D/L they have 10/2012--will fax over if needed.  Patient needs to contact Erin at [phone] to set this up ASAP-- they have been trying to get in touch and getting no response.   Spoke with Jennifer(pt daughter)--will contact [facility] to set this up.

## 2013-04-19 NOTE — Telephone Encounter (Signed)
Would also send a certified letter to pt asking him to do this, and make sure is documented.

## 2013-04-23 NOTE — Telephone Encounter (Addendum)
Ashtyn I have advised them to fax the download to the upfront fax. I will forward the message to you to follow-up on. Carron Curie, CMA

## 2013-04-23 NOTE — Telephone Encounter (Signed)
Spoke with Erin @ [facility]-- D/L to be faxed ASAP  Both fax numbers given.

## 2013-04-24 NOTE — Telephone Encounter (Signed)
D/L received and is in your green folder for review. 30 day report.

## 2013-04-26 ENCOUNTER — Telehealth: Payer: Self-pay | Admitting: Pulmonary Disease

## 2013-04-26 ENCOUNTER — Encounter: Payer: Self-pay | Admitting: Pulmonary Disease

## 2013-04-26 NOTE — Telephone Encounter (Signed)
Please let pt know that his download showed that his pressures on the device are very low.  Makes me wonder if he even has sleep apnea anymore?? I think he needs to have a sleep study to look at this again.  See if he is ok and I will order.

## 2013-04-29 NOTE — Telephone Encounter (Signed)
We are not talking about stopping machine.  He needs to have a sleep study to see if sleep apnea is really his problem, or if it is due to something else.  He may need a different machine if he does not have a sleep apnea issue, and this is more muscle weakness?  He would need an even different machine if his problem is related to his brain forgetting to tell him to take a deep breath.  We need to characterize what is the problem here, since I suspect obstructive sleep apnea is not the issue since he has lost so much weight .

## 2013-04-29 NOTE — Telephone Encounter (Signed)
Spoke with Pt daughter [name]-- Molli Knock with moving forward with sleep study. Daughter wanting to know if patient is going to be able to start new machine--pt states that her father needs to have CPAP d/t CO2 levels. Daughter states that when pt is without CPAP, his CO2 levels increase "through the roof" therefore, does not want father (patient) to be d/c from machine.  Please advise Dr Shelle Iron. Thanks.

## 2013-05-01 NOTE — Telephone Encounter (Signed)
Pt's daughter returned Christian [name]'s call & can be reached at [phone].  Christian [name]

## 2013-05-01 NOTE — Telephone Encounter (Signed)
RC to Hipolito Bayley (daughter [facility]) [phone]

## 2013-05-01 NOTE — Telephone Encounter (Signed)
LMOM x 1 Need to talk to daughter again in regards to her concerns and Dr Teddy Spike response

## 2013-05-01 NOTE — Telephone Encounter (Signed)
Daughter returning call

## 2013-05-02 NOTE — Telephone Encounter (Signed)
Spoke with patients daughter [name])-- Aware of Dr Teddy Spike response below. Pt would like to know when to look for having this sleep study.   Please advise Dr Shelle Iron if order needs to be placed so that I may let patients family know. Thanks.

## 2013-05-13 ENCOUNTER — Other Ambulatory Visit: Payer: Self-pay | Admitting: Pulmonary Disease

## 2013-05-13 DIAGNOSIS — J9610 Chronic respiratory failure, unspecified whether with hypoxia or hypercapnia: Secondary | ICD-10-CM

## 2013-05-13 DIAGNOSIS — G4733 Obstructive sleep apnea (adult) (pediatric): Secondary | ICD-10-CM

## 2013-05-13 DIAGNOSIS — R0689 Other abnormalities of breathing: Secondary | ICD-10-CM

## 2013-05-13 NOTE — Telephone Encounter (Signed)
Let them know that I have put in order for a sleep study.  Someone should be calling them soon.

## 2013-05-13 NOTE — Telephone Encounter (Signed)
Pt states that she spoke with Dawne at our office this afternoon-- Scheduled for 06/11/13 for sleep study. Pt is on cancellation list, will be called if opening.

## 2013-05-14 ENCOUNTER — Ambulatory Visit: Payer: Self-pay | Admitting: Internal Medicine

## 2013-05-14 LAB — CBC CANCER CENTER
Basophil %: 0.8 %
Eosinophil #: 0.1 x10 3/mm (ref 0.0–0.7)
HGB: 12.4 g/dL — ABNORMAL LOW (ref 13.0–18.0)
Lymphocyte #: 0.6 x10 3/mm — ABNORMAL LOW (ref 1.0–3.6)
Lymphocyte %: 13.1 %
MCH: 27.3 pg (ref 26.0–34.0)
MCV: 89 fL (ref 80–100)
Monocyte #: 0.4 x10 3/mm (ref 0.2–1.0)
Neutrophil #: 3.2 x10 3/mm (ref 1.4–6.5)
Neutrophil %: 73.9 %
RBC: 4.54 10*6/uL (ref 4.40–5.90)
WBC: 4.4 x10 3/mm (ref 3.8–10.6)

## 2013-05-14 LAB — IRON AND TIBC
Iron Bind.Cap.(Total): 267 ug/dL (ref 250–450)
Iron: 46 ug/dL — ABNORMAL LOW (ref 65–175)
Unbound Iron-Bind.Cap.: 221 ug/dL

## 2013-06-10 ENCOUNTER — Ambulatory Visit: Payer: Self-pay | Admitting: Internal Medicine

## 2013-06-11 ENCOUNTER — Ambulatory Visit (HOSPITAL_BASED_OUTPATIENT_CLINIC_OR_DEPARTMENT_OTHER): Payer: PRIVATE HEALTH INSURANCE | Attending: Pulmonary Disease | Admitting: Radiology

## 2013-06-11 VITALS — Ht 70.0 in | Wt 177.0 lb

## 2013-06-11 DIAGNOSIS — G4733 Obstructive sleep apnea (adult) (pediatric): Secondary | ICD-10-CM

## 2013-06-11 DIAGNOSIS — R0689 Other abnormalities of breathing: Secondary | ICD-10-CM

## 2013-06-11 DIAGNOSIS — G4710 Hypersomnia, unspecified: Secondary | ICD-10-CM | POA: Insufficient documentation

## 2013-06-11 DIAGNOSIS — J9610 Chronic respiratory failure, unspecified whether with hypoxia or hypercapnia: Secondary | ICD-10-CM

## 2013-06-20 ENCOUNTER — Encounter: Payer: Self-pay | Admitting: Cardiovascular Disease

## 2013-06-24 NOTE — Telephone Encounter (Signed)
Faxed completed paperwork to [employer] at [phone].

## 2013-06-26 ENCOUNTER — Telehealth: Payer: Self-pay | Admitting: Pulmonary Disease

## 2013-06-26 NOTE — Telephone Encounter (Signed)
Please let pt know that his sleep study had to rescored, and I am sorry for the delay.  It was a good study, but the data has to be scored correctly.  Hopefully will have this week.

## 2013-06-27 NOTE — Telephone Encounter (Signed)
lmomtcb for pt 

## 2013-06-28 NOTE — Telephone Encounter (Signed)
Spoke with Jennifer-patients daughter-she is aware that sleep study had to be re-scored and [facility] or his nurse will keep them updated on the results once we have them.

## 2013-06-28 NOTE — Telephone Encounter (Signed)
Pt's daughter, [name], is returning call & can be reached at [phone].  Antionette Fairy

## 2013-07-02 ENCOUNTER — Encounter: Payer: Self-pay | Admitting: Cardiovascular Disease

## 2013-07-09 ENCOUNTER — Ambulatory Visit: Payer: PRIVATE HEALTH INSURANCE | Admitting: Cardiovascular Disease

## 2013-07-12 DIAGNOSIS — R0609 Other forms of dyspnea: Secondary | ICD-10-CM

## 2013-07-12 DIAGNOSIS — J9610 Chronic respiratory failure, unspecified whether with hypoxia or hypercapnia: Secondary | ICD-10-CM

## 2013-07-12 DIAGNOSIS — R0989 Other specified symptoms and signs involving the circulatory and respiratory systems: Secondary | ICD-10-CM

## 2013-07-12 DIAGNOSIS — G4733 Obstructive sleep apnea (adult) (pediatric): Secondary | ICD-10-CM

## 2013-07-12 NOTE — Sleep Study (Signed)
   NAME: Christian [name] DATE OF BIRTH:  [birth year] MEDICAL RECORD NUMBER [id number]  LOCATION: [facility]  PHYSICIAN: [place] OF STUDY: 06/11/2013  SLEEP STUDY TYPE: Nocturnal Polysomnogram               REFERRING PHYSICIAN: Clance, Armando Reichert, MD  INDICATION FOR STUDY: hypersomnia with sleep apnea  EPWORTH SLEEPINESS SCORE:  19 HEIGHT: 5\' 10"  (177.8 cm)  WEIGHT: 177 lb (80.287 kg)    Body mass index is 25.4 kg/(m^2).  NECK SIZE: 15 in.   SLEEP ARCHITECTURE: The patient had a total sleep time of 377 minutes with very little slow-wave sleep or REM. Sleep onset latency was normal, and REM onset was significantly delayed. Sleep efficiency was mildly reduced at 85%.  RESPIRATORY DATA: The patient was found to have 9 apneas and 13 obstructive hypopneas, giving him an apnea hypopnea index of only 4 events per hour. The events occurred primarily in the supine position, and there was mild snoring noted throughout. It should be noted the patient had an elevated end tidal CO2 throughout the procedure.  OXYGEN DATA: There was oxygen desaturation as low as 81%, and the patient was subsequently started on 1 L of supplemental oxygen at 2200 hours.  CARDIAC DATA: Occasional PVC noted. There was one 8 beat run of wide complex tachycardia at a rate of 180 beats per minute.  MOVEMENT/PARASOMNIA: The patient had moderate numbers of limb movements, with minimal sleep disruption. There were no abnormal behaviors seen.  IMPRESSION/ RECOMMENDATION:    1) small numbers of obstructive events which do not meet the AHI criteria for the obstructive sleep apnea syndrome.  2) elevated end tidal CO2 throughout much of the night, supportive of the diagnosis of hypoventilation. Seen how the patient is not obese, this is most likely secondary to either neuromuscular weakness or central hypoventilation.  3) oxygen desaturation persistently in the 80s, requiring the addition of 1 L of  supplemental oxygen.    [place], American Board of Sleep Medicine  ELECTRONICALLY SIGNED ON:  07/12/2013, 8:44 AM [place] PH: [phone]   FX: [phone] [place]

## 2013-07-15 ENCOUNTER — Telehealth: Payer: Self-pay | Admitting: *Deleted

## 2013-07-15 NOTE — Telephone Encounter (Signed)
Needs appt ASAP--this week with Dr Gwenette Greet to review sleep study.

## 2013-07-17 ENCOUNTER — Ambulatory Visit (INDEPENDENT_AMBULATORY_CARE_PROVIDER_SITE_OTHER): Payer: Medicare HMO | Admitting: Pulmonary Disease

## 2013-07-17 ENCOUNTER — Encounter: Payer: Self-pay | Admitting: Cardiovascular Disease

## 2013-07-17 ENCOUNTER — Ambulatory Visit (INDEPENDENT_AMBULATORY_CARE_PROVIDER_SITE_OTHER): Payer: Medicare HMO | Admitting: Cardiovascular Disease

## 2013-07-17 ENCOUNTER — Encounter: Payer: Self-pay | Admitting: Pulmonary Disease

## 2013-07-17 VITALS — BP 106/60 | HR 84 | Ht 70.0 in | Wt 183.5 lb

## 2013-07-17 VITALS — BP 138/62 | HR 102 | Temp 97.5°F | Ht 70.0 in | Wt 184.0 lb

## 2013-07-17 DIAGNOSIS — I2510 Atherosclerotic heart disease of native coronary artery without angina pectoris: Secondary | ICD-10-CM

## 2013-07-17 DIAGNOSIS — I5032 Chronic diastolic (congestive) heart failure: Secondary | ICD-10-CM

## 2013-07-17 DIAGNOSIS — J9610 Chronic respiratory failure, unspecified whether with hypoxia or hypercapnia: Secondary | ICD-10-CM

## 2013-07-17 DIAGNOSIS — I10 Essential (primary) hypertension: Secondary | ICD-10-CM

## 2013-07-17 DIAGNOSIS — R0609 Other forms of dyspnea: Secondary | ICD-10-CM

## 2013-07-17 DIAGNOSIS — G4733 Obstructive sleep apnea (adult) (pediatric): Secondary | ICD-10-CM

## 2013-07-17 DIAGNOSIS — R0689 Other abnormalities of breathing: Secondary | ICD-10-CM

## 2013-07-17 DIAGNOSIS — R0989 Other specified symptoms and signs involving the circulatory and respiratory systems: Secondary | ICD-10-CM

## 2013-07-17 DIAGNOSIS — I4891 Unspecified atrial fibrillation: Secondary | ICD-10-CM

## 2013-07-17 MED ORDER — RIVAROXABAN 20 MG PO TABS: 20.0000 mg | ORAL_TABLET | Freq: Every day | ORAL | Status: DC

## 2013-07-17 MED ORDER — MIDODRINE HCL 10 MG PO TABS: 10.0000 mg | ORAL_TABLET | Freq: Three times a day (TID) | ORAL | Status: DC | PRN

## 2013-07-17 MED ORDER — DRONEDARONE HCL 400 MG PO TABS: 400.0000 mg | ORAL_TABLET | Freq: Two times a day (BID) | ORAL | Status: DC

## 2013-07-17 NOTE — Patient Instructions (Signed)
Will find out what we need to do to see about a new machine at night to provide adequate ventilation. Will schedule for breathing tests at [facility] in the near future. Will call to discuss the above once everything is back.

## 2013-07-17 NOTE — Progress Notes (Signed)
   Subjective:    Patient ID: Christian [name], male    DOB: 04-Feb-1941, 73 y.o.   MRN: [id number]  HPI Patient comes in today for followup after his recent sleep study. He was found to have an AHI of only 4 events per hour, and therefore does not meet criteria for the obstructive or central sleep apnea. He did have end tidal CO2 monitoring which increased as high as 57 during the night. I have reviewed the study in detail with the patient and his family, and answered all their questions.   Review of Systems  Constitutional: Negative for fever and unexpected weight change.  HENT: Positive for congestion, postnasal drip, rhinorrhea, sinus pressure and sore throat. Negative for dental problem, ear pain, nosebleeds, sneezing and trouble swallowing.   Eyes: Negative for redness and itching.  Respiratory: Negative for cough, chest tightness, shortness of breath and wheezing.   Cardiovascular: Negative for palpitations and leg swelling.  Gastrointestinal: Negative for nausea and vomiting.  Genitourinary: Negative for dysuria.  Musculoskeletal: Negative for joint swelling.  Skin: Negative for rash.  Neurological: Negative for headaches.  Hematological: Does not bruise/bleed easily.  Psychiatric/Behavioral: Negative for dysphoric mood. The patient is not nervous/anxious.        Objective:   Physical Exam Frail-appearing male in no acute distress Nose without purulence or discharge noted Neck without lymphadenopathy or thyromegaly Chest clear to auscultation Cardiac exam with a slight irregularity Lower extremities with mild edema, no cyanosis Alert and oriented, moves all 4 extremities.       Assessment & Plan:

## 2013-07-17 NOTE — Assessment & Plan Note (Signed)
Currently with no symptoms of angina. No further workup at this time. Continue current medication regimen. 

## 2013-07-17 NOTE — Patient Instructions (Addendum)
You are doing well. No medication changes were made.  Please call us if you have new issues that need to be addressed before your next appt.  Your physician wants you to follow-up in: 6 months.  You will receive a reminder letter in the mail two months in advance. If you don't receive a letter, please call our office to schedule the follow-up appointment.   

## 2013-07-17 NOTE — Assessment & Plan Note (Signed)
The patient has not had PFTs in over a year, and we'll therefore repeat these to make sure he does not have significant obstructive lung disease. We'll also do muscle pressures as well. I suspect he is also going to need a CT chest repeated to make sure he does not have significant intrinsic lung disease. However, his CT from last year did not show this.

## 2013-07-17 NOTE — Assessment & Plan Note (Signed)
Difficult to read EKGs though on close examination, he appears to be in normal sinus rhythm. Baseline artifact from headshaking despite pillow support. We'll continue anticoagulation

## 2013-07-17 NOTE — Assessment & Plan Note (Signed)
Blood pressure is well controlled on today's visit. No changes made to the medications. 

## 2013-07-17 NOTE — Progress Notes (Signed)
Patient ID: Christian [name], male    DOB: 1940/08/11, 73 y.o.   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   Remote h/o profound esophagitis, weight loss of more than 50 pounds, anorexia, lack of interest, food getting stuck in his throat, difficulty swallowing dry mouth, periods of hallucinations and memory problems.  hospital at [facility] September 02 2011 following an EGD that showed severe esophagitis, moderate gastritis,.  cardioversion  for atrial fibrillation which was successful though in followup he converted back to atrial fibrillation. asymptomatic in June 2013 when seen in clinic while in atrial fibrillation .   Several admissions to the hospital for failure to thrive, leg weakness, mental status changes, hypoxia, hypercapnic respiratory distress.   In followup today, he reports that he is doing well. Daughter also reports that he is doing well. Weight has been stable, he denies having any significant fluid retention, leg edema is stable. He takes torsemide 20 mg every other day. He continues to live in independent living. He uses his BiPAP at nighttime also with his naps. Weight seems to run 180 up to 183 pounds. He is getting over a upper respiratory infection. Edema on the left slightly worse than the leg edema on the right. Recently seen by Dr. Gwenette Greet and per the daughter there is plan to request a special type of breathing machine to replace his BiPAP  Previous  Echocardiogram showed normal LV systolic function greater than 55%, borderline elevated right ventricular systolic pressures pulmonic aortic valve stenosis. and   EKG :  Baseline artifact, PVCs noted, appears to be in normal sinus rhythm with heart rate in the 70s. Baseline artifact from head shaking despite pillow support   Outpatient Encounter Prescriptions as of 07/17/2013  Medication Sig  . acetaminophen (TYLENOL) 500 MG tablet Take 500 mg by mouth every 6 (six) hours as needed.  . dronedarone (MULTAQ) 400 MG tablet Take 1 tablet (400 mg total) by mouth 2 (two) times daily with a meal.  . lactulose, encephalopathy, (CHRONULAC) 10 GM/15ML SOLN Take 20 ml by mouth 2 times a week.  May give 3 doses per week if needed.  . menthol-cetylpyridinium (CEPACOL) 3 MG lozenge Take 1 lozenge by mouth as needed for sore throat.  . metoprolol tartrate (LOPRESSOR) 25 MG tablet Take 1 tablet (25 mg total) by mouth 2 (two) times daily.  . midodrine (PROAMATINE) 10 MG tablet Take 10 mg by mouth 2 (two) times daily.  . nitroGLYCERIN (NITROSTAT) 0.4 MG SL tablet Place 0.4 mg under the tongue every 5 (five) minutes as needed.    . NON FORMULARY Oxygen @ 2 liters at bedtime.  . Nutritional Supplements (FEEDING SUPPLEMENT, GLUCERNA 1.2 CAL,) LIQD Take 237 mLs by mouth daily as needed.  Marland Kitchen omeprazole (PRILOSEC) 20 MG capsule Take 1 capsule (20 mg total) by mouth daily.  . polyethylene glycol powder (GLYCOLAX/MIRALAX) powder Take 17 g by mouth daily.  . potassium chloride (K-DUR,KLOR-CON) 10 MEQ tablet Take 10 mEq by mouth every other day.  . Rivaroxaban (XARELTO) 20 MG TABS Take 1 tablet (20 mg total) by mouth daily.  Marland Kitchen spironolactone (ALDACTONE) 25 MG tablet Take 12.5-25 mg by mouth every other  day.  . tamsulosin (FLOMAX) 0.4 MG CAPS Take 1 capsule (0.4 mg total) by mouth daily after supper.  . torsemide (DEMADEX) 20 MG tablet Take 40 mg by mouth every other day.   Review of Systems  HENT: Negative.   Eyes: Negative.   Respiratory: Positive for shortness of breath.   Cardiovascular: Positive for leg swelling.  Gastrointestinal: Negative.   Endocrine: Negative.    Musculoskeletal: Positive for gait problem.  Skin: Negative.   Allergic/Immunologic: Negative.   Neurological: Positive for weakness.  Hematological: Negative.   Psychiatric/Behavioral: Positive for decreased concentration.  All other systems reviewed and are negative.    BP 106/60  Pulse 84  Ht 5\' 10"  (1.778 m)  Wt 183 lb 8 oz (83.235 kg)  BMI 26.33 kg/m2  Physical Exam  Nursing note and vitals reviewed. Constitutional: He is oriented to person, place, and time. He appears well-developed and well-nourished.  HENT:  Head: Normocephalic.  Nose: Nose normal.  Mouth/Throat: Oropharynx is clear and moist.  Eyes: Conjunctivae are normal. Pupils are equal, round, and reactive to light.  Neck: Normal range of motion. Neck supple. No JVD present.  Cardiovascular: Normal rate, S1 normal, S2 normal and intact distal pulses.  An irregularly irregular rhythm present. Exam reveals no gallop and no friction rub.   Murmur heard.  Crescendo systolic murmur is present with a grade of 2/6   1+ pitting edema bilaterally, not wearing his compression hose  Pulmonary/Chest: Effort normal and breath sounds normal. No respiratory distress. He has no wheezes. He has no rales. He exhibits no tenderness.  Abdominal: Soft. Bowel sounds are normal. He exhibits no distension. There is no tenderness.  Musculoskeletal: Normal range of motion. He exhibits no edema and no tenderness.  Lymphadenopathy:    He has no cervical adenopathy.  Neurological: He is alert and oriented to person, place, and time. Coordination normal.  Skin: Skin is warm and dry. No rash noted. No erythema.  Psychiatric: He has a normal mood and affect. His behavior is normal. Judgment and thought content normal.      Assessment and Plan

## 2013-07-17 NOTE — Assessment & Plan Note (Signed)
Weight appears to be stable on torsemide 20 mg every other day. No further medication changes

## 2013-07-17 NOTE — Assessment & Plan Note (Signed)
Managed by Dr. Gwenette Greet. Seems to be reasonably stable on his BiPAP. Encouraged him to continue to wear this at nighttime and with naps.

## 2013-07-17 NOTE — Assessment & Plan Note (Signed)
The patient has chronic hypercarbic respiratory failure that is not secondary to obesity hypoventilation or COPD by PFTs in 2013. He does not have sleep apnea by his recent sleep study, but this did document hypercarbia during the night. This is either related to neuromuscular weakness of unknown origin, or central hypoventilation. I favor the former. I would like to try and get him started on noninvasive positive pressure ventilation with an IVAPS device. Will need to go through his home care company and find out what criteria have to be met. In the meantime, I've asked him to continue his bilevel device at home with supplemental oxygen.

## 2013-07-18 ENCOUNTER — Other Ambulatory Visit: Payer: Self-pay | Admitting: Pulmonary Disease

## 2013-07-18 DIAGNOSIS — R0602 Shortness of breath: Secondary | ICD-10-CM

## 2013-07-19 ENCOUNTER — Ambulatory Visit (INDEPENDENT_AMBULATORY_CARE_PROVIDER_SITE_OTHER): Payer: Medicare HMO | Admitting: Pulmonary Disease

## 2013-07-19 ENCOUNTER — Telehealth: Payer: Self-pay | Admitting: Pulmonary Disease

## 2013-07-19 DIAGNOSIS — J9610 Chronic respiratory failure, unspecified whether with hypoxia or hypercapnia: Secondary | ICD-10-CM

## 2013-07-19 LAB — PULMONARY FUNCTION TEST
DL/VA % pred: 125 %
DL/VA: 5.54 ml/min/mmHg/L
DLCO UNC % PRED: 45 %
DLCO UNC: 12.78 ml/min/mmHg
FEF 25-75 POST: 3.58 L/s
FEF 25-75 Pre: 3.08 L/sec
FEF2575-%Change-Post: 16 %
FEF2575-%Pred-Post: 170 %
FEF2575-%Pred-Pre: 146 %
FEV1-%Change-Post: -1 %
FEV1-%PRED-PRE: 47 %
FEV1-%Pred-Post: 46 %
FEV1-POST: 1.31 L
FEV1-Pre: 1.33 L
FEV1FVC-%CHANGE-POST: 0 %
FEV1FVC-%Pred-Pre: 134 %
FEV6-%CHANGE-POST: -1 %
FEV6-%PRED-PRE: 37 %
FEV6-%Pred-Post: 36 %
FEV6-PRE: 1.35 L
FEV6-Post: 1.33 L
FEV6FVC-%PRED-POST: 106 %
FEV6FVC-%PRED-PRE: 106 %
FVC-%Change-Post: -1 %
FVC-%PRED-POST: 34 %
FVC-%Pred-Pre: 34 %
FVC-POST: 1.33 L
FVC-PRE: 1.35 L
POST FEV1/FVC RATIO: 98 %
Post FEV6/FVC ratio: 100 %
Pre FEV1/FVC ratio: 99 %
Pre FEV6/FVC Ratio: 100 %
RV % pred: 62 %
RV: 1.47 L
TLC % pred: 43 %
TLC: 2.8 L

## 2013-07-19 NOTE — Telephone Encounter (Signed)
Called and spoke with Erin from [facility]. She received an order that stated to see what we need to do about getting pt an IVAPS device. No other information was given. Also pt has new insurance and this was not sent nor was pt recent sleep study. I advised will send this over to her. Fax [phone]. I have faxed this information over to her. She will review it and let us know if anything is needed

## 2013-07-19 NOTE — Telephone Encounter (Signed)
I called and LMTCB for [employer]

## 2013-07-19 NOTE — Progress Notes (Signed)
PFT done today. 

## 2013-07-22 NOTE — Telephone Encounter (Signed)
I spoke with Junie Panning and she states she faxed over a CMN to up front fax late on Friday. This needs to be signed and faxed back asap so pt can get his machine. Mindy have you seen this form?  [place] Bing, CMA

## 2013-07-23 ENCOUNTER — Encounter: Payer: Self-pay | Admitting: Cardiovascular Disease

## 2013-07-25 ENCOUNTER — Telehealth: Payer: Self-pay | Admitting: Pulmonary Disease

## 2013-07-25 NOTE — Telephone Encounter (Signed)
Please check with [facility]" in [place] and see what the status is of his IVAPS device.  Also let pt know that we are still working on it.  Thanks.

## 2013-07-26 NOTE — Telephone Encounter (Signed)
Pt was seen 07/17/13 with [facility] to review his study Nothing further needed.

## 2013-07-26 NOTE — Telephone Encounter (Signed)
See phone note from [facility] on 1-15. [facility] is following up on it. [place] Bing, CMA

## 2013-07-30 ENCOUNTER — Encounter: Payer: Self-pay | Admitting: Pulmonary Disease

## 2013-07-31 ENCOUNTER — Telehealth: Payer: Self-pay

## 2013-07-31 ENCOUNTER — Telehealth: Payer: Self-pay | Admitting: Pulmonary Disease

## 2013-07-31 DIAGNOSIS — J9610 Chronic respiratory failure, unspecified whether with hypoxia or hypercapnia: Secondary | ICD-10-CM

## 2013-07-31 NOTE — Telephone Encounter (Signed)
Notified the patient's daughter Anderson [name] that the [employer] for the patient assistance program has been approved until 07/26/2014.

## 2013-07-31 NOTE — Telephone Encounter (Signed)
Received infor from [facility] pt's ins in no longer in network with them we will have to change dme i believe forms on your desk Christian [name]

## 2013-07-31 NOTE — Telephone Encounter (Signed)
I have filled out a few days ago and gave to [place].  We need to get on this so the new dme doesn't pull the "has to be within 30 days bullcrap they use to not get pt their equipment.  He has had all the studies and qualifies for IVAPS.

## 2013-08-02 NOTE — Telephone Encounter (Signed)
Order has been placed to [facility]. thanks

## 2013-08-02 NOTE — Telephone Encounter (Signed)
i need an order to change this pt from [facility] to [facility] thanks Joellen Jersey

## 2013-08-07 ENCOUNTER — Other Ambulatory Visit: Payer: Self-pay | Admitting: Pulmonary Disease

## 2013-08-07 ENCOUNTER — Telehealth: Payer: Self-pay | Admitting: Pulmonary Disease

## 2013-08-07 DIAGNOSIS — J9610 Chronic respiratory failure, unspecified whether with hypoxia or hypercapnia: Secondary | ICD-10-CM

## 2013-08-07 NOTE — Telephone Encounter (Signed)
I called and spoke with Arbie Cookey. She reports pt Bipap ST was purchased end of 2011/2012. Pt now owns it. Per Arbie Cookey the IVAPS and the Bipap ST is considered the same type of unit and has the same HICPIC's code (code for billing insurance). D/T this insurance will not pay for another unit. [facility] can you please assist in this? thanks

## 2013-08-09 ENCOUNTER — Other Ambulatory Visit: Payer: Self-pay | Admitting: Cardiovascular Disease

## 2013-08-12 ENCOUNTER — Other Ambulatory Visit: Payer: Self-pay | Admitting: *Deleted

## 2013-08-12 MED ORDER — METOPROLOL TARTRATE 25 MG PO TABS: 25.0000 mg | ORAL_TABLET | Freq: Two times a day (BID) | ORAL | Status: DC

## 2013-08-12 NOTE — Telephone Encounter (Signed)
[facility] knows these are 2 different machines however insurance company has these coded the same so ins just paid for one in 2012 and it is still working (per pt) so no one can bill for another machine 2017 other solution could be NIV which you will prob  not want this is an insurance issue not a machine issue

## 2013-08-12 NOTE — Telephone Encounter (Signed)
Requested Prescriptions   Signed Prescriptions Disp Refills  . metoprolol tartrate (LOPRESSOR) 25 MG tablet 60 tablet 3    Sig: Take 1 tablet (25 mg total) by mouth 2 (two) times daily.    Authorizing Provider: GOLLAN, TIMOTHY J    Ordering User: Gloriajean Okun C    

## 2013-08-12 NOTE — Telephone Encounter (Signed)
pcc is checking with insurance.

## 2013-08-15 NOTE — Telephone Encounter (Signed)
[place] is there anything else needed on this message? [place] Bing, CMA

## 2013-08-15 NOTE — Telephone Encounter (Signed)
[place] we will have to order a vent send an order thanks Joellen Jersey

## 2013-08-16 ENCOUNTER — Encounter: Payer: Self-pay | Admitting: Cardiovascular Disease

## 2013-08-19 ENCOUNTER — Encounter: Payer: Self-pay | Admitting: Cardiovascular Disease

## 2013-08-21 NOTE — Telephone Encounter (Signed)
done

## 2013-09-02 ENCOUNTER — Ambulatory Visit: Payer: Self-pay | Admitting: Internal Medicine

## 2013-09-09 ENCOUNTER — Ambulatory Visit: Payer: Self-pay | Admitting: Internal Medicine

## 2013-09-09 LAB — CANCER CENTER HEMOGLOBIN: HGB: 11.9 g/dL — AB (ref 13.0–18.0)

## 2013-09-09 LAB — FERRITIN: FERRITIN (ARMC): 240 ng/mL (ref 8–388)

## 2013-09-09 LAB — IRON AND TIBC
IRON BIND. CAP.(TOTAL): 281 ug/dL (ref 250–450)
Iron Saturation: 12 %
Iron: 35 ug/dL — ABNORMAL LOW (ref 65–175)
Unbound Iron-Bind.Cap.: 246 ug/dL

## 2013-10-09 ENCOUNTER — Ambulatory Visit: Admit: 2013-10-09 | Disposition: A | Discharge status: Home / Self Care | Payer: Self-pay | Admitting: Internal Medicine

## 2013-10-10 ENCOUNTER — Other Ambulatory Visit: Payer: Self-pay | Admitting: Family Medicine

## 2013-10-24 ENCOUNTER — Other Ambulatory Visit: Payer: Self-pay | Admitting: Family Medicine

## 2013-10-28 ENCOUNTER — Telehealth: Payer: Self-pay

## 2013-10-28 NOTE — Telephone Encounter (Signed)
Notified Anderson [name] (daughter) [employer] for the patient assistance is available to pick up.

## 2013-11-08 ENCOUNTER — Ambulatory Visit: Payer: Self-pay | Admitting: Internal Medicine

## 2013-12-17 ENCOUNTER — Encounter: Payer: Self-pay | Admitting: Cardiovascular Disease

## 2013-12-26 ENCOUNTER — Ambulatory Visit: Payer: Self-pay | Admitting: Internal Medicine

## 2013-12-26 LAB — IRON AND TIBC
IRON BIND. CAP.(TOTAL): 243 ug/dL — AB (ref 250–450)
IRON SATURATION: 14 %
IRON: 33 ug/dL — AB (ref 65–175)
Unbound Iron-Bind.Cap.: 210 ug/dL

## 2013-12-26 LAB — FERRITIN: Ferritin (ARMC): 361 ng/mL (ref 8–388)

## 2013-12-26 LAB — CANCER CENTER HEMOGLOBIN: HGB: 11.9 g/dL — ABNORMAL LOW (ref 13.0–18.0)

## 2014-01-02 NOTE — Telephone Encounter (Signed)
Called daughter that we have pt multaq. Her phone mailbox was full & unable to leave message. Horton Chin RN

## 2014-01-02 NOTE — Telephone Encounter (Signed)
No answer & mailbox is full. Horton Chin RN

## 2014-01-05 ENCOUNTER — Other Ambulatory Visit: Payer: Self-pay | Admitting: Family Medicine

## 2014-01-05 ENCOUNTER — Other Ambulatory Visit: Payer: Self-pay | Admitting: Cardiovascular Disease

## 2014-01-06 NOTE — Telephone Encounter (Signed)
Okay to continue.  Sent.  Please schedule a f/u.  79min visit.  Thanks.

## 2014-01-06 NOTE — Telephone Encounter (Signed)
Electronic refill request. Last office visit:   01/31/13.  Last Filled:   90 capsule 0 RF on   10/10/2013.  Please advise.

## 2014-01-07 NOTE — Telephone Encounter (Signed)
Called pt and VM was full so unable to lmovm--will try again later

## 2014-01-08 ENCOUNTER — Ambulatory Visit: Payer: Self-pay | Admitting: Internal Medicine

## 2014-01-09 NOTE — Telephone Encounter (Signed)
Spoke with son Wille Glaser) and he says he will text his sister to have her schedule the 30 min OV.

## 2014-01-15 ENCOUNTER — Ambulatory Visit (INDEPENDENT_AMBULATORY_CARE_PROVIDER_SITE_OTHER): Payer: Medicare HMO | Admitting: Family Medicine

## 2014-01-15 ENCOUNTER — Encounter: Payer: Self-pay | Admitting: Family Medicine

## 2014-01-15 VITALS — BP 118/70 | HR 86 | Temp 97.6°F | Wt 185.2 lb

## 2014-01-15 DIAGNOSIS — R7309 Other abnormal glucose: Secondary | ICD-10-CM

## 2014-01-15 DIAGNOSIS — R739 Hyperglycemia, unspecified: Secondary | ICD-10-CM

## 2014-01-15 DIAGNOSIS — E119 Type 2 diabetes mellitus without complications: Secondary | ICD-10-CM

## 2014-01-15 DIAGNOSIS — I5032 Chronic diastolic (congestive) heart failure: Secondary | ICD-10-CM

## 2014-01-15 DIAGNOSIS — H8110 Benign paroxysmal vertigo, unspecified ear: Secondary | ICD-10-CM

## 2014-01-15 DIAGNOSIS — I4891 Unspecified atrial fibrillation: Secondary | ICD-10-CM

## 2014-01-15 DIAGNOSIS — I509 Heart failure, unspecified: Secondary | ICD-10-CM

## 2014-01-15 DIAGNOSIS — D509 Iron deficiency anemia, unspecified: Secondary | ICD-10-CM

## 2014-01-15 LAB — COMPREHENSIVE METABOLIC PANEL
ALK PHOS: 108 U/L (ref 39–117)
ALT: 21 U/L (ref 0–53)
AST: 26 U/L (ref 0–37)
Albumin: 3.2 g/dL — ABNORMAL LOW (ref 3.5–5.2)
BILIRUBIN TOTAL: 0.6 mg/dL (ref 0.2–1.2)
BUN: 25 mg/dL — AB (ref 6–23)
CO2: 40 meq/L — AB (ref 19–32)
CREATININE: 1.3 mg/dL (ref 0.4–1.5)
Calcium: 9.5 mg/dL (ref 8.4–10.5)
Chloride: 90 mEq/L — ABNORMAL LOW (ref 96–112)
GFR: 56.6 mL/min — AB (ref >60.00)
GLUCOSE: 100 mg/dL — AB (ref 70–99)
Potassium: 4.8 mEq/L (ref 3.5–5.1)
Sodium: 136 mEq/L (ref 135–145)
TOTAL PROTEIN: 7.1 g/dL (ref 6.0–8.3)

## 2014-01-15 LAB — CBC WITH DIFFERENTIAL/PLATELET
BASOS ABS: 0 10*3/uL (ref 0.0–0.1)
Basophils Relative: 0.3 % (ref 0.0–3.0)
Eosinophils Absolute: 0.2 10*3/uL (ref 0.0–0.7)
Eosinophils Relative: 1.9 % (ref 0.0–5.0)
HEMATOCRIT: 39.3 % (ref 39.0–52.0)
Hemoglobin: 12.4 g/dL — ABNORMAL LOW (ref 13.0–17.0)
LYMPHS ABS: 0.9 10*3/uL (ref 0.7–4.0)
Lymphocytes Relative: 11.2 % — ABNORMAL LOW (ref 12.0–46.0)
MCHC: 31.6 g/dL (ref 30.0–36.0)
MCV: 87.2 fl (ref 78.0–100.0)
MONO ABS: 0.9 10*3/uL (ref 0.1–1.0)
Monocytes Relative: 10.5 % (ref 3.0–12.0)
Neutro Abs: 6.3 10*3/uL (ref 1.4–7.7)
Neutrophils Relative %: 76.1 % (ref 43.0–77.0)
PLATELETS: 301 10*3/uL (ref 150.0–400.0)
RBC: 4.5 Mil/uL (ref 4.22–5.81)
RDW: 14.8 % (ref 11.5–15.5)
WBC: 8.2 10*3/uL (ref 4.0–10.5)

## 2014-01-15 LAB — HEMOGLOBIN A1C: Hgb A1c MFr Bld: 5.6 % (ref 4.6–6.5)

## 2014-01-15 MED ORDER — METOPROLOL TARTRATE 25 MG PO TABS: 25.0000 mg | ORAL_TABLET | Freq: Two times a day (BID) | ORAL | Status: DC

## 2014-01-15 NOTE — Progress Notes (Signed)
Pre visit review using our clinic review tool, if applicable. No additional management support is needed unless otherwise documented below in the visit note.  Vertigo sx, going on episodically, brief, about 1 minute or so, over the last about 2 weeks.  Worse in AM after taking meds and again in the PM after taking PM meds.  Never right before AM or PM meds.  Ear ringing, at baseline.  No recent med changes.  Not lightheaded.  Taking midodrine once a day.  Sx can happen with head rotation, last night when he turned his head.  Can happen at rest, in a chair.  Some occ R ear pain but this appears to be on the pinna and related to hearing aid rubbing his external ear.    L anterior thigh pain.  When the fluid builds up in his legs, he gets more pain there.  H/o L>R leg edema at baseline, over the years.    Continues on his diuretics at baseline, every other day.  The day after taking his diuretics, he feels weaker.  Weight is down on current regimen.    Living alone, with some help, family coming in intermittently.  Limited salt in diet, not adding extra.  Not SOB now, "when I take my meds like I'm supposed to, my breathing is good."  Positive pressure support is helping him, compliant, sleeping better.  He is considering his housing and support options/needs, with his family.  Encouraged him to discuss if more with family.    Still on IV iron treatment per heme.    PMH and SH reviewed  ROS: See HPI, otherwise noncontributory.  Meds, vitals, and allergies reviewed.   nad ncat Tm wnl Small irritated area on external R pinna likely from hearing aid noted.   OP wnl Ctab rrr abd soft Ext with 2+ BLE edema

## 2014-01-15 NOTE — Patient Instructions (Signed)
Don't change your meds for now.  Go to the lab on the way out.  We'll contact you with your lab report. Take care.  

## 2014-01-16 ENCOUNTER — Encounter: Payer: Self-pay | Admitting: *Deleted

## 2014-01-16 DIAGNOSIS — H8110 Benign paroxysmal vertigo, unspecified ear: Secondary | ICD-10-CM | POA: Insufficient documentation

## 2014-01-16 NOTE — Assessment & Plan Note (Signed)
See notes on labs. 

## 2014-01-16 NOTE — Assessment & Plan Note (Signed)
Doesn't appear to have chest congestion, BLE edema is noted.  Wouldn't change meds at this point, see notes on labs.  It appears that QOD diuretic dosing is reasonable for his current weight.

## 2014-01-16 NOTE — Assessment & Plan Note (Signed)
Sounds much more like BPV than orthostasis.  Will send patient home exercises, assuming his labs are reasonable.  See notes on labs.

## 2014-01-16 NOTE — Assessment & Plan Note (Signed)
Sounds to be back in NSR today.  No change in meds.

## 2014-01-17 ENCOUNTER — Encounter: Payer: Self-pay | Admitting: Pulmonary Disease

## 2014-01-17 ENCOUNTER — Ambulatory Visit (INDEPENDENT_AMBULATORY_CARE_PROVIDER_SITE_OTHER): Payer: Medicare HMO | Admitting: Pulmonary Disease

## 2014-01-17 VITALS — BP 108/70 | HR 95 | Temp 97.5°F | Ht 70.0 in | Wt 187.7 lb

## 2014-01-17 DIAGNOSIS — J9610 Chronic respiratory failure, unspecified whether with hypoxia or hypercapnia: Secondary | ICD-10-CM

## 2014-01-17 DIAGNOSIS — R0609 Other forms of dyspnea: Secondary | ICD-10-CM

## 2014-01-17 DIAGNOSIS — R0689 Other abnormalities of breathing: Secondary | ICD-10-CM

## 2014-01-17 DIAGNOSIS — R0989 Other specified symptoms and signs involving the circulatory and respiratory systems: Secondary | ICD-10-CM

## 2014-01-17 DIAGNOSIS — J9612 Chronic respiratory failure with hypercapnia: Secondary | ICD-10-CM

## 2014-01-17 NOTE — Progress Notes (Signed)
   Subjective:    Patient ID: Christian [name], male    DOB: Oct 03, 1940, 73 y.o.   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 It has been unclear whether the patient has neuromuscular weakness of unknown origin or central hypoventilation. He also has significant underlying cardiac disease. He has been started on a noninvasive ventilator, and has done extremely well since the last visit. His sleep is much improved at night, and his dyspnea during the day has improved significantly as well. His oxygenation is also better, probably secondary to lung recruitment. He is having no issues with the device or pressure, but is having some mask leaks. He has not kept up with his mask cushions as much as he needs to.   Review of Systems  Constitutional: Negative for fever and unexpected weight change.  HENT: Negative for congestion, dental problem, ear pain, nosebleeds, postnasal drip, rhinorrhea, sinus pressure, sneezing, sore throat and trouble swallowing.   Eyes: Negative for redness and itching.  Respiratory: Negative for cough, chest tightness, shortness of breath and wheezing.   Cardiovascular: Negative for palpitations and leg swelling.  Gastrointestinal: Negative for nausea and vomiting.  Genitourinary: Negative for dysuria.  Musculoskeletal: Negative for joint swelling.  Skin: Negative for rash.  Neurological: Negative for headaches.  Hematological: Does not bruise/bleed easily.  Psychiatric/Behavioral: Negative for dysphoric mood. The patient is not nervous/anxious.        Objective:   Physical Exam Frail-appearing male in no acute distress Nose without purulence or discharge noted Neck without lymphadenopathy or thyromegaly Chest with fairly clear breath sounds, no wheezing Cardiac exam with mild irregularity, 3/6 systolic murmur Lower extremities with mild edema, no cyanosis Alert and oriented, moves all 4  extremities.       Assessment & Plan:  \

## 2014-01-17 NOTE — Assessment & Plan Note (Signed)
The patient is doing extremely well on his noninvasive ventilator, and feels that his sleep has greatly improved, as has his breathing during the day. His oxygen saturations are much better, probably as a result of improved atelectasis. He is having a few mask leak issues, but this is not overly significant. I have asked him to make sure that he replaces his mask cushions on a regular basis, and to try and keep his face clean shaven. If he continues to have mask leak issues, we will see if his home care company can assist with a different mask.

## 2014-01-17 NOTE — Patient Instructions (Signed)
Stay on your respiratory assist device, and keep up with mask cushion changes If you continue to have persistent mask leak, let us know and will have your home care company work with you on fit.  followup with me again in 38mos, and bring your download card to the visit.

## 2014-01-29 ENCOUNTER — Telehealth: Payer: Self-pay | Admitting: Family Medicine

## 2014-01-29 NOTE — Telephone Encounter (Signed)
He doesn't have DM2 by his last 2 A1cs.  That should take him out of the Dm2 bundle.  Thanks.

## 2014-01-29 NOTE — Telephone Encounter (Signed)
Diabetic Bundle.  Report shows that pt has not had LDL checked since 06/06/11.  Last OV with Dr. Damita Dunnings was on 01/15/14.  Do you want to schedule lab appt to recheck LDL?

## 2014-02-01 ENCOUNTER — Other Ambulatory Visit: Payer: Self-pay | Admitting: Family Medicine

## 2014-02-01 ENCOUNTER — Other Ambulatory Visit: Payer: Self-pay | Admitting: Cardiovascular Disease

## 2014-02-27 ENCOUNTER — Ambulatory Visit (INDEPENDENT_AMBULATORY_CARE_PROVIDER_SITE_OTHER): Payer: Medicare HMO | Admitting: Cardiovascular Disease

## 2014-02-27 ENCOUNTER — Encounter: Payer: Self-pay | Admitting: Cardiovascular Disease

## 2014-02-27 VITALS — BP 100/70 | HR 117 | Ht 70.0 in | Wt 190.0 lb

## 2014-02-27 DIAGNOSIS — I951 Orthostatic hypotension: Secondary | ICD-10-CM

## 2014-02-27 DIAGNOSIS — I2510 Atherosclerotic heart disease of native coronary artery without angina pectoris: Secondary | ICD-10-CM

## 2014-02-27 DIAGNOSIS — R0609 Other forms of dyspnea: Secondary | ICD-10-CM

## 2014-02-27 DIAGNOSIS — R0689 Other abnormalities of breathing: Secondary | ICD-10-CM

## 2014-02-27 DIAGNOSIS — I5032 Chronic diastolic (congestive) heart failure: Secondary | ICD-10-CM

## 2014-02-27 DIAGNOSIS — I4891 Unspecified atrial fibrillation: Secondary | ICD-10-CM

## 2014-02-27 DIAGNOSIS — R0602 Shortness of breath: Secondary | ICD-10-CM

## 2014-02-27 DIAGNOSIS — I10 Essential (primary) hypertension: Secondary | ICD-10-CM

## 2014-02-27 DIAGNOSIS — R0989 Other specified symptoms and signs involving the circulatory and respiratory systems: Secondary | ICD-10-CM

## 2014-02-27 NOTE — Assessment & Plan Note (Signed)
Continues to have problems with orthostatic hypotension. Recommended he take midodrine 3 times per day one hour prior to eating

## 2014-02-27 NOTE — Patient Instructions (Signed)
You are doing well. No medication changes were made.  Please take midodrine in the later afternoon  Please call us if you have new issues that need to be addressed before your next appt.  Your physician wants you to follow-up in: 3 months.  You will receive a reminder letter in the mail two months in advance. If you don't receive a letter, please call our office to schedule the follow-up appointment.

## 2014-02-27 NOTE — Assessment & Plan Note (Signed)
Chronic atrial fibrillation, on anticoagulation Heart rate is mildly elevated today but did seem to improve on clinical exam

## 2014-02-27 NOTE — Assessment & Plan Note (Signed)
Reports that he is doing well on his noninvasive ventilator system , followed by Dr. Gwenette Greet

## 2014-02-27 NOTE — Assessment & Plan Note (Signed)
Recommended he take his torsemide regularly every other day plus extra torsemide for any shortness of breath. He is not monitoring his weight. Significant edema consistent with lymphedema mostly makes it difficult to determine his fluid status. Recent BMP with mildly elevated BUN suggesting borderline pre-renal. Suggested he continue on his current regimen

## 2014-02-27 NOTE — Assessment & Plan Note (Signed)
Waxing waning diastolic CHF. Recommended he take extra torsemide for shortness of breath symptoms

## 2014-02-27 NOTE — Progress Notes (Signed)
Patient ID: GAINES CARTMELL, male    DOB: [birth year], 73 y.o.   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 He is chronic atrial fibrillation.  Remote h/o profound esophagitis, weight loss of more than 50 pounds, anorexia, lack of interest, food getting stuck in his throat, difficulty swallowing dry mouth, periods of hallucinations and memory problems.  hospital at [facility] September 02 2011 following an EGD that showed severe esophagitis, moderate gastritis,.  cardioversion  for atrial fibrillation which was successful though in followup he converted back to atrial fibrillation. asymptomatic in June 2013 when seen in clinic while in atrial fibrillation .   Several admissions to the hospital for failure to thrive, leg weakness, mental status changes, hypoxia, hypercapnic respiratory distress.   In followup today, he reports that he is doing well.  He has been followed by Dr. Gwenette Greet and appears to be doing well on a noninvasive ventilator . No recent hospital admissions for hypercapnia and confusion.  He is somewhat compliant with his torsemide 20 mg every other day. Sometimes misses a day. When he does miss, he has worsening shortness of breath. He spends most of his day sitting around the house, is relatively sedentary with no exercise program Family is unable to exercise with him and they are busy. 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 He takes this possibly once a day after he eats breakfast. He does not want to take this prior to eating as he feels it caused side effects  Previous  Echocardiogram showed normal LV systolic function greater than 55%, borderline elevated right ventricular systolic pressures pulmonic aortic valve stenosis. and   EKG : Atrial fibrillation with ventricular rate 117 beats per minute  Outpatient Encounter Prescriptions as of 02/27/2014  Medication Sig  . acetaminophen (TYLENOL) 500 MG tablet Take 500 mg by mouth every 6 (six) hours as needed.  . dronedarone (MULTAQ) 400 MG tablet Take 1 tablet (400 mg total) by mouth 2 (two) times daily with a meal.  . lactulose, encephalopathy, (CHRONULAC) 10 GM/15ML SOLN Take 20 ml by mouth 2 times a week.  May give 3 doses per week if needed.  . menthol-cetylpyridinium (CEPACOL) 3 MG lozenge Take 1 lozenge by mouth as needed for sore throat.  . metoprolol tartrate (LOPRESSOR) 25 MG tablet Take 1 tablet (25 mg total) by mouth 2 (two) times daily.  . midodrine (PROAMATINE) 10 MG tablet Take 1 tablet (10 mg total) by mouth 3 (three) times daily as needed.  . nitroGLYCERIN (NITROSTAT) 0.4 MG SL tablet Place 0.4 mg under the tongue every 5 (five) minutes as needed.    . NON FORMULARY Oxygen @ 2 liters at bedtime.  . Nutritional Supplements (FEEDING SUPPLEMENT, GLUCERNA 1.2 CAL,) LIQD Take 237 mLs by mouth daily as needed.  Marland Kitchen omeprazole (PRILOSEC) 20 MG capsule TAKE 1  CAPSULE BY MOUTH EVERY DAY  . polyethylene glycol powder (GLYCOLAX/MIRALAX) powder Take 17 g by mouth daily.  . potassium chloride (K-DUR,KLOR-CON) 10 MEQ tablet Take 10 mEq by mouth every other day.  . spironolactone (ALDACTONE) 25 MG tablet Take 12.5-25 mg by mouth every other day.  . tamsulosin (FLOMAX) 0.4 MG CAPS capsule TAKE 1 CAPSULE BY MOUTH EVERY EVENING AFTER SUPPER  .  torsemide (DEMADEX) 20 MG tablet Take 20 mg by mouth every other day.   Alveda Reasons 20 MG TABS tablet TAKE 1 TABLET BY MOUTH EVERY DAY    Review of Systems  HENT: Negative.   Eyes: Negative.   Respiratory: Negative.   Cardiovascular: Positive for leg swelling.  Gastrointestinal: Negative.   Endocrine: Negative.   Musculoskeletal: Positive for gait problem.  Skin: Negative.   Allergic/Immunologic: Negative.   Neurological: Positive for dizziness and weakness.  Hematological: Negative.   Psychiatric/Behavioral: Positive for decreased concentration.  All other systems reviewed and are negative.   BP 100/70  Pulse 117  Ht 5\' 10"  (1.778 m)  Wt 190 lb (86.183 kg)  BMI 27.26 kg/m2  Physical Exam  Nursing note and vitals reviewed. Constitutional: He is oriented to person, place, and time. He appears well-developed and well-nourished.  HENT:  Head: Normocephalic.  Nose: Nose normal.  Mouth/Throat: Oropharynx is clear and moist.  Eyes: Conjunctivae are normal. Pupils are equal, round, and reactive to light.  Neck: Normal range of motion. Neck supple. No JVD present.  Cardiovascular: Normal rate, S1 normal, S2 normal and intact distal pulses.  An irregularly irregular rhythm present. Exam reveals no gallop and no friction rub.   Murmur heard.  Crescendo systolic murmur is present with a grade of 2/6   1+ pitting edema bilaterally  Pulmonary/Chest: Effort normal and breath sounds normal. No respiratory distress. He has no wheezes. He has no rales. He exhibits no tenderness.  Abdominal: Soft. Bowel sounds are normal. He exhibits no distension. There is no tenderness.  Musculoskeletal: Normal range of motion. He exhibits no edema and no tenderness.  Lymphadenopathy:    He has no cervical adenopathy.  Neurological: He is alert and oriented to person, place, and time. Coordination normal.  Skin: Skin is warm and dry. No rash noted. No erythema.  Psychiatric: He has a normal mood and  affect. His behavior is normal. Judgment and thought content normal.      Assessment and Plan

## 2014-04-03 ENCOUNTER — Other Ambulatory Visit: Payer: Self-pay | Admitting: Family Medicine

## 2014-05-12 ENCOUNTER — Other Ambulatory Visit: Payer: Self-pay

## 2014-05-12 NOTE — Telephone Encounter (Signed)
Notified patient's daughter the multaq with the patient assistance program is here and ready to be picked up.

## 2014-05-28 ENCOUNTER — Other Ambulatory Visit: Payer: Self-pay | Admitting: Cardiovascular Disease

## 2014-05-30 ENCOUNTER — Ambulatory Visit (INDEPENDENT_AMBULATORY_CARE_PROVIDER_SITE_OTHER): Payer: Medicare HMO | Admitting: Cardiovascular Disease

## 2014-05-30 ENCOUNTER — Encounter: Payer: Self-pay | Admitting: Cardiovascular Disease

## 2014-05-30 VITALS — BP 102/60 | HR 110 | Ht 71.0 in | Wt 192.0 lb

## 2014-05-30 DIAGNOSIS — R29898 Other symptoms and signs involving the musculoskeletal system: Secondary | ICD-10-CM

## 2014-05-30 DIAGNOSIS — I4891 Unspecified atrial fibrillation: Secondary | ICD-10-CM

## 2014-05-30 DIAGNOSIS — R609 Edema, unspecified: Secondary | ICD-10-CM

## 2014-05-30 DIAGNOSIS — I10 Essential (primary) hypertension: Secondary | ICD-10-CM

## 2014-05-30 DIAGNOSIS — E785 Hyperlipidemia, unspecified: Secondary | ICD-10-CM

## 2014-05-30 DIAGNOSIS — R0689 Other abnormalities of breathing: Secondary | ICD-10-CM

## 2014-05-30 DIAGNOSIS — I5032 Chronic diastolic (congestive) heart failure: Secondary | ICD-10-CM

## 2014-05-30 DIAGNOSIS — I951 Orthostatic hypotension: Secondary | ICD-10-CM

## 2014-05-30 DIAGNOSIS — I5031 Acute diastolic (congestive) heart failure: Secondary | ICD-10-CM

## 2014-05-30 NOTE — Assessment & Plan Note (Signed)
Weight relatively unchanged on torsemide every other day. I suspect his ventilator is helping with his symptoms. Also likely drinking less, eating less. No changes to be torsemide

## 2014-05-30 NOTE — Assessment & Plan Note (Signed)
Family held his aspirin as this was upsetting his stomach. Continues to take anticoagulation for atrial fibrillation. Heart rate mildly elevated but he is asymptomatic. Minimal room on his blood pressure to push his rate control

## 2014-05-30 NOTE — Assessment & Plan Note (Signed)
Stable symptoms. He has follow-up with Dr. Gwenette Greet. respiratory therapist visits his house once every 3 months for adjustment to his device.

## 2014-05-30 NOTE — Patient Instructions (Signed)
You are doing well. No medication changes were made.  Please call us if you have new issues that need to be addressed before your next appt.  Your physician wants you to follow-up in: 6 months.  You will receive a reminder letter in the mail two months in advance. If you don't receive a letter, please call our office to schedule the follow-up appointment.   

## 2014-05-30 NOTE — Assessment & Plan Note (Signed)
Edema is noncardiac in nature. Likely venous insufficiency. Unable to exclude a component of lymphedema. Better with compression hose

## 2014-05-30 NOTE — Assessment & Plan Note (Signed)
Blood pressures running low. not taking midodrine as he is asymptomatic. No changes to his medication

## 2014-05-30 NOTE — Assessment & Plan Note (Signed)
In the past he has held his cholesterol medication. Family was concerned about leg weakness. We'll discuss this with him again in follow-up as he is stable. Needs repeat lipid panel

## 2014-05-30 NOTE — Progress Notes (Signed)
Patient ID: Christian [name], male    DOB: 28-Oct-1940, 73 y.o.   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 He has chronic atrial fibrillation. He presents for follow-up of his atrial fibrillation, chronic diastolic CHF  In follow-up today, he continues to be relatively stable. Weight is essentially unchanged from prior clinic visit. Up 2 pounds. He does have chronic mild lower extremity edema likely noncardiac related. He is relatively sedentary, no regular exercise. Daughter presents with him today and concerned about worsening leg weakness. 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 He is taking more Tylenol on a regular basis for his arthritic pain. Reports that he takes torsemide 20 mg every other day. On this regimen, weight is stable. Occasionally will take an extra torsemide if needed. Continues to sleep with his respiratory device and reports he is sleeping well. He does have interrupted sleep, goes to the bathroom frequently overnight. Denies any lightheadedness or dizziness. Previously had postprandial orthostasis and required midodrine. Currently not taking midodrine  Recent lab work was reviewed with him from 01/15/2014. CBC, BMP all normal, hemoglobin A1c well controlled EKG on today's visit shows atrial fibrillation with ventricular rate 109 bpm, nonspecific ST abnormality  Other past medical history Remote h/o profound esophagitis, weight loss of more than 50 pounds, anorexia, lack of interest, food getting stuck in his throat, difficulty swallowing dry mouth, periods of hallucinations and  memory problems.  hospital at [facility] September 02 2011 following an EGD that showed severe esophagitis, moderate gastritis,.  cardioversion  for atrial fibrillation which was successful though in followup he converted back to atrial fibrillation. asymptomatic in June 2013 when seen in clinic while in atrial fibrillation .  His biggest complaint is that he has dizziness after he eats Several prior admissions to the hospital for failure to thrive, leg weakness, mental status changes, hypoxia, hypercapnic respiratory distress.   Sees Dr. Gwenette Greet of pulmonary,doing well on a noninvasive ventilator . No recent hospital admissions for hypercapnia and confusion.   Previous  Echocardiogram showed normal LV systolic function greater than 55%, borderline elevated right ventricular systolic pressures pulmonic aortic valve stenosis. an  Outpatient Encounter Prescriptions as of 05/30/2014  Medication Sig  . acetaminophen (TYLENOL) 500 MG tablet Take 500 mg by mouth every 6 (six) hours as needed.  . dronedarone (MULTAQ) 400 MG tablet Take 1 tablet (400 mg total) by mouth 2 (two) times daily with a meal.  . lactulose, encephalopathy, (CHRONULAC) 10 GM/15ML SOLN Take 20 ml by mouth 2 times a week.  May give 3 doses per week if needed.  . menthol-cetylpyridinium (CEPACOL) 3 MG lozenge Take 1 lozenge by mouth as needed for sore throat.  . metoprolol tartrate (LOPRESSOR) 25 MG tablet Take 1 tablet (25 mg total) by mouth 2 (two) times daily.  . midodrine (PROAMATINE) 10 MG tablet TAKE 1 TABLET BY MOUTH THREE TIMES DAILY  . nitroGLYCERIN (NITROSTAT) 0.4 MG SL tablet Place 0.4 mg under the tongue every 5 (five) minutes as needed.    . NON FORMULARY Oxygen @ 2 liters at bedtime.  . Nutritional Supplements (FEEDING SUPPLEMENT, GLUCERNA 1.2 CAL,) LIQD Take  237 mLs by mouth daily as needed.  Marland Kitchen omeprazole (PRILOSEC) 20 MG capsule TAKE 1 CAPSULE BY MOUTH EVERY DAY  . polyethylene glycol powder (GLYCOLAX/MIRALAX) powder Take  17 g by mouth daily.  . potassium chloride (K-DUR,KLOR-CON) 10 MEQ tablet Take 10 mEq by mouth every other day.  . spironolactone (ALDACTONE) 25 MG tablet Take 12.5-25 mg by mouth every other day.  . tamsulosin (FLOMAX) 0.4 MG CAPS capsule TAKE 1 CAPSULE BY MOUTH EVERY EVENING AFTER SUPPER.  Marland Kitchen torsemide (DEMADEX) 20 MG tablet Take 20 mg by mouth every other day.   Alveda Reasons 20 MG TABS tablet TAKE 1 TABLET BY MOUTH EVERY DAY  . [DISCONTINUED] midodrine (PROAMATINE) 10 MG tablet Take 1 tablet (10 mg total) by mouth 3 (three) times daily as needed.   Social history  reports that he quit smoking about 32 years ago. His smoking use included Cigarettes. He has a 45 pack-year smoking history. He has never used smokeless tobacco. He reports that he does not drink alcohol or use illicit drugs.   Review of Systems  Constitutional: Positive for fatigue.  HENT: Negative.   Eyes: Negative.   Respiratory: Negative.   Cardiovascular: Positive for leg swelling.  Gastrointestinal: Negative.   Musculoskeletal: Positive for gait problem.  Neurological: Positive for weakness.  Hematological: Negative.   Psychiatric/Behavioral: Positive for decreased concentration.  All other systems reviewed and are negative.   BP 102/60 mmHg  Pulse 110  Ht 5\' 11"  (1.803 m)  Wt 192 lb (87.091 kg)  BMI 26.79 kg/m2  Physical Exam  Constitutional: He is oriented to person, place, and time. He appears well-developed and well-nourished.  HENT:  Head: Normocephalic.  Nose: Nose normal.  Mouth/Throat: Oropharynx is clear and moist.  Eyes: Conjunctivae are normal. Pupils are equal, round, and reactive to light.  Neck: Normal range of motion. Neck supple. No JVD present.  Cardiovascular: Normal rate, S1 normal, S2 normal and intact distal pulses.  An irregularly irregular rhythm present. Exam reveals no gallop and no friction rub.   Murmur heard.  Systolic murmur is present with a grade of 2/6  Trace to 1+ pitting  edema to below the knees, compression hose in place  Pulmonary/Chest: Effort normal and breath sounds normal. No respiratory distress. He has no wheezes. He has no rales. He exhibits no tenderness.  Abdominal: Soft. Bowel sounds are normal. He exhibits no distension. There is no tenderness.  Musculoskeletal: Normal range of motion. He exhibits edema. He exhibits no tenderness.  Lymphadenopathy:    He has no cervical adenopathy.  Neurological: He is alert and oriented to person, place, and time. Coordination normal.  Skin: Skin is warm and dry. No rash noted. No erythema.  Psychiatric: He has a normal mood and affect. His behavior is normal. Judgment and thought content normal.      Assessment and Plan   Nursing note and vitals reviewed.

## 2014-05-30 NOTE — Assessment & Plan Note (Addendum)
Daughter  concerned about sedentary lifestyle, weakness. Despite recommending regular physical exercise, he is not motivated to do this. Family not available to push him until late in the evening

## 2014-06-03 ENCOUNTER — Ambulatory Visit: Payer: Self-pay | Admitting: Internal Medicine

## 2014-06-03 LAB — FERRITIN: FERRITIN (ARMC): 333 ng/mL (ref 8–388)

## 2014-06-03 LAB — CBC CANCER CENTER
Basophil #: 0 x10 3/mm (ref 0.0–0.1)
Basophil %: 0.5 %
EOS ABS: 0.2 x10 3/mm (ref 0.0–0.7)
Eosinophil %: 2.8 %
HCT: 38.1 % — ABNORMAL LOW (ref 40.0–52.0)
HGB: 11.8 g/dL — AB (ref 13.0–18.0)
Lymphocyte #: 0.7 x10 3/mm — ABNORMAL LOW (ref 1.0–3.6)
Lymphocyte %: 11.9 %
MCH: 27.4 pg (ref 26.0–34.0)
MCHC: 31.1 g/dL — AB (ref 32.0–36.0)
MCV: 88 fL (ref 80–100)
Monocyte #: 0.5 x10 3/mm (ref 0.2–1.0)
Monocyte %: 8.8 %
Neutrophil #: 4.6 x10 3/mm (ref 1.4–6.5)
Neutrophil %: 76 %
Platelet: 240 x10 3/mm (ref 150–440)
RBC: 4.31 10*6/uL — AB (ref 4.40–5.90)
RDW: 14.7 % — AB (ref 11.5–14.5)
WBC: 6.1 x10 3/mm (ref 3.8–10.6)

## 2014-06-03 LAB — IRON AND TIBC
Iron Bind.Cap.(Total): 267 ug/dL (ref 250–450)
Iron Saturation: 16 %
Iron: 43 ug/dL — ABNORMAL LOW (ref 65–175)
Unbound Iron-Bind.Cap.: 224 ug/dL

## 2014-06-08 ENCOUNTER — Other Ambulatory Visit: Payer: Self-pay | Admitting: Cardiovascular Disease

## 2014-06-10 ENCOUNTER — Ambulatory Visit: Payer: Self-pay | Admitting: Internal Medicine

## 2014-07-11 ENCOUNTER — Other Ambulatory Visit: Payer: Self-pay | Admitting: Family Medicine

## 2014-07-14 ENCOUNTER — Encounter: Payer: Self-pay | Admitting: Pulmonary Disease

## 2014-07-14 ENCOUNTER — Encounter: Payer: Self-pay | Admitting: Cardiovascular Disease

## 2014-07-14 NOTE — Telephone Encounter (Signed)
Sent.  Please schedule f/u for the spring.   Thanks.

## 2014-07-14 NOTE — Telephone Encounter (Signed)
Electronic refill request. Last Filled:    90 capsule 0 RF on 04/04/2014  Does patient need an OV, not seen by you since July 2015?  Please advise.

## 2014-07-15 NOTE — Telephone Encounter (Signed)
Christian [name] advised  

## 2014-07-20 ENCOUNTER — Other Ambulatory Visit: Payer: Self-pay | Admitting: Family Medicine

## 2014-07-21 ENCOUNTER — Ambulatory Visit: Payer: Medicare HMO | Admitting: Pulmonary Disease

## 2014-07-21 MED ORDER — TORSEMIDE 20 MG PO TABS: 20.0000 mg | ORAL_TABLET | ORAL | Status: DC

## 2014-07-27 ENCOUNTER — Other Ambulatory Visit: Payer: Self-pay | Admitting: Family Medicine

## 2014-08-03 IMAGING — CR DG CHEST 2V
1 series · 2 of 2 positions shown · non-contrast
Comparison: none

REASON FOR EXAM: dizziness
COMMENTS:   LMP: (Male)

PROCEDURE:     DXR - DXR CHEST PA (OR AP) AND LATERAL  - April 23, 2012  [DATE]
RESULT:     Comparison: 04/06/2012

[Series 1: x chest ap · 0.14mm/px · 2 of 2 slices shown]
[im 1/2]
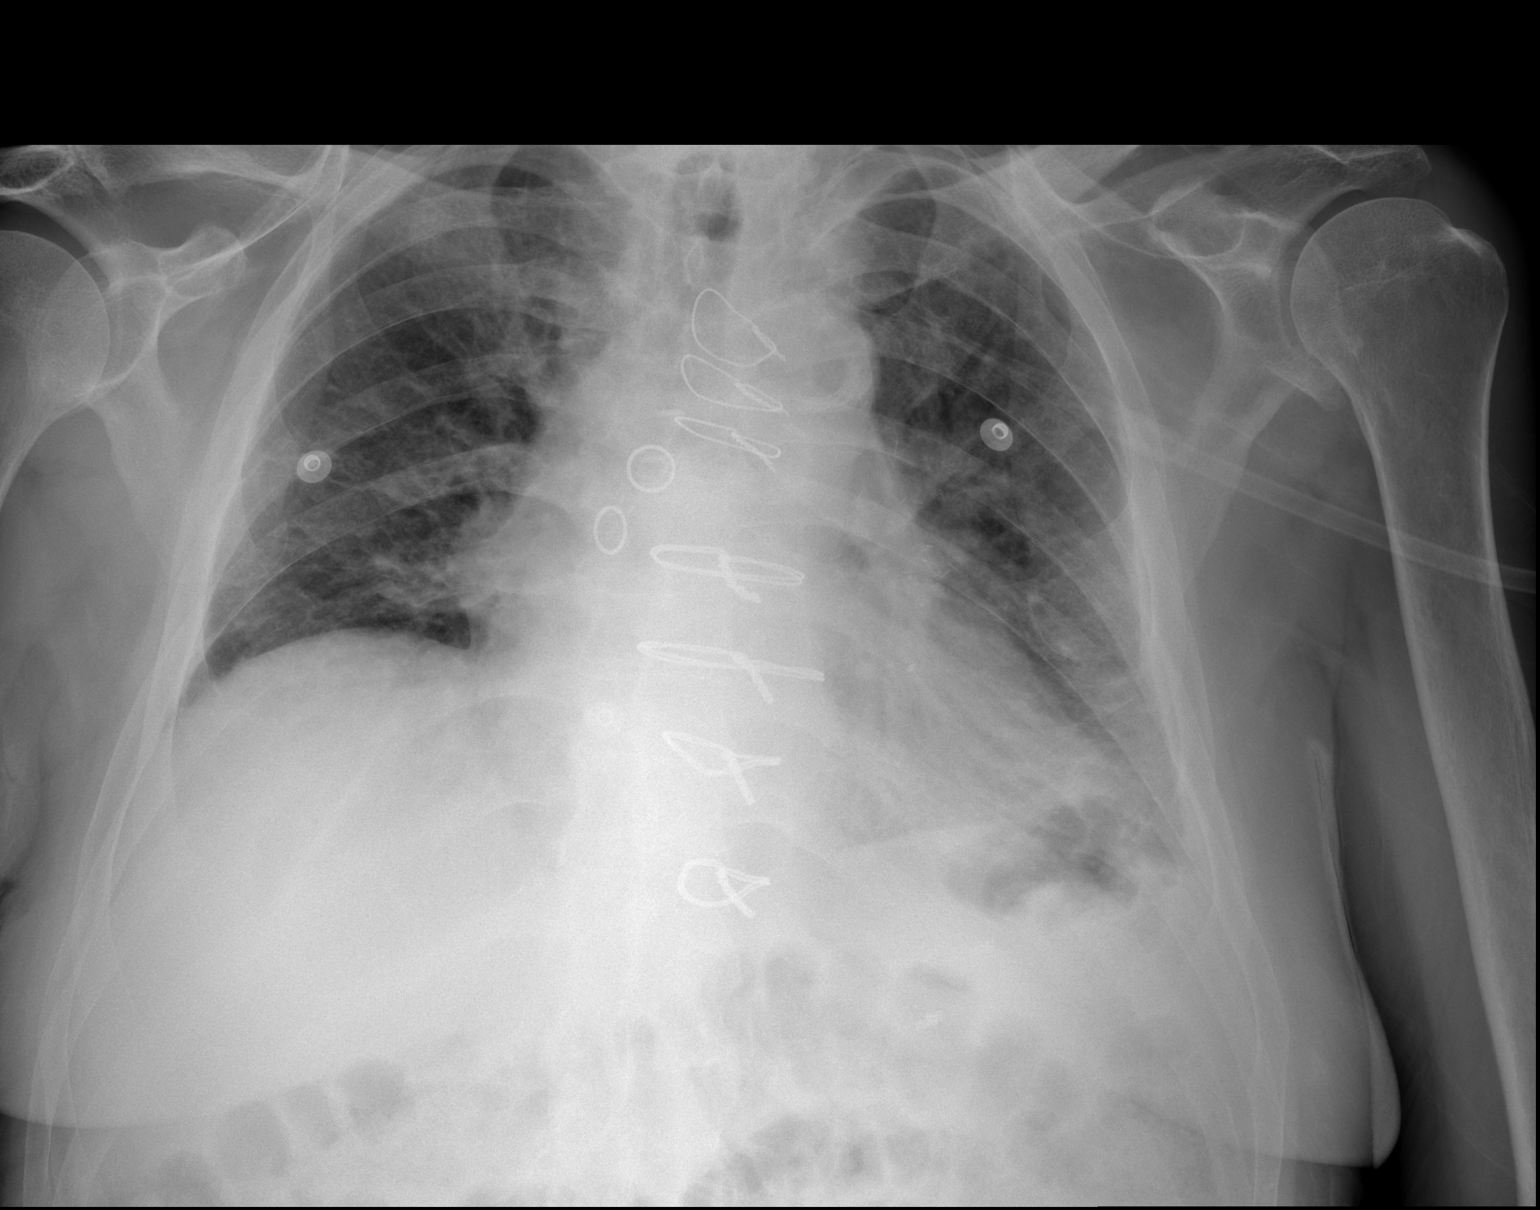
[im 2/2]
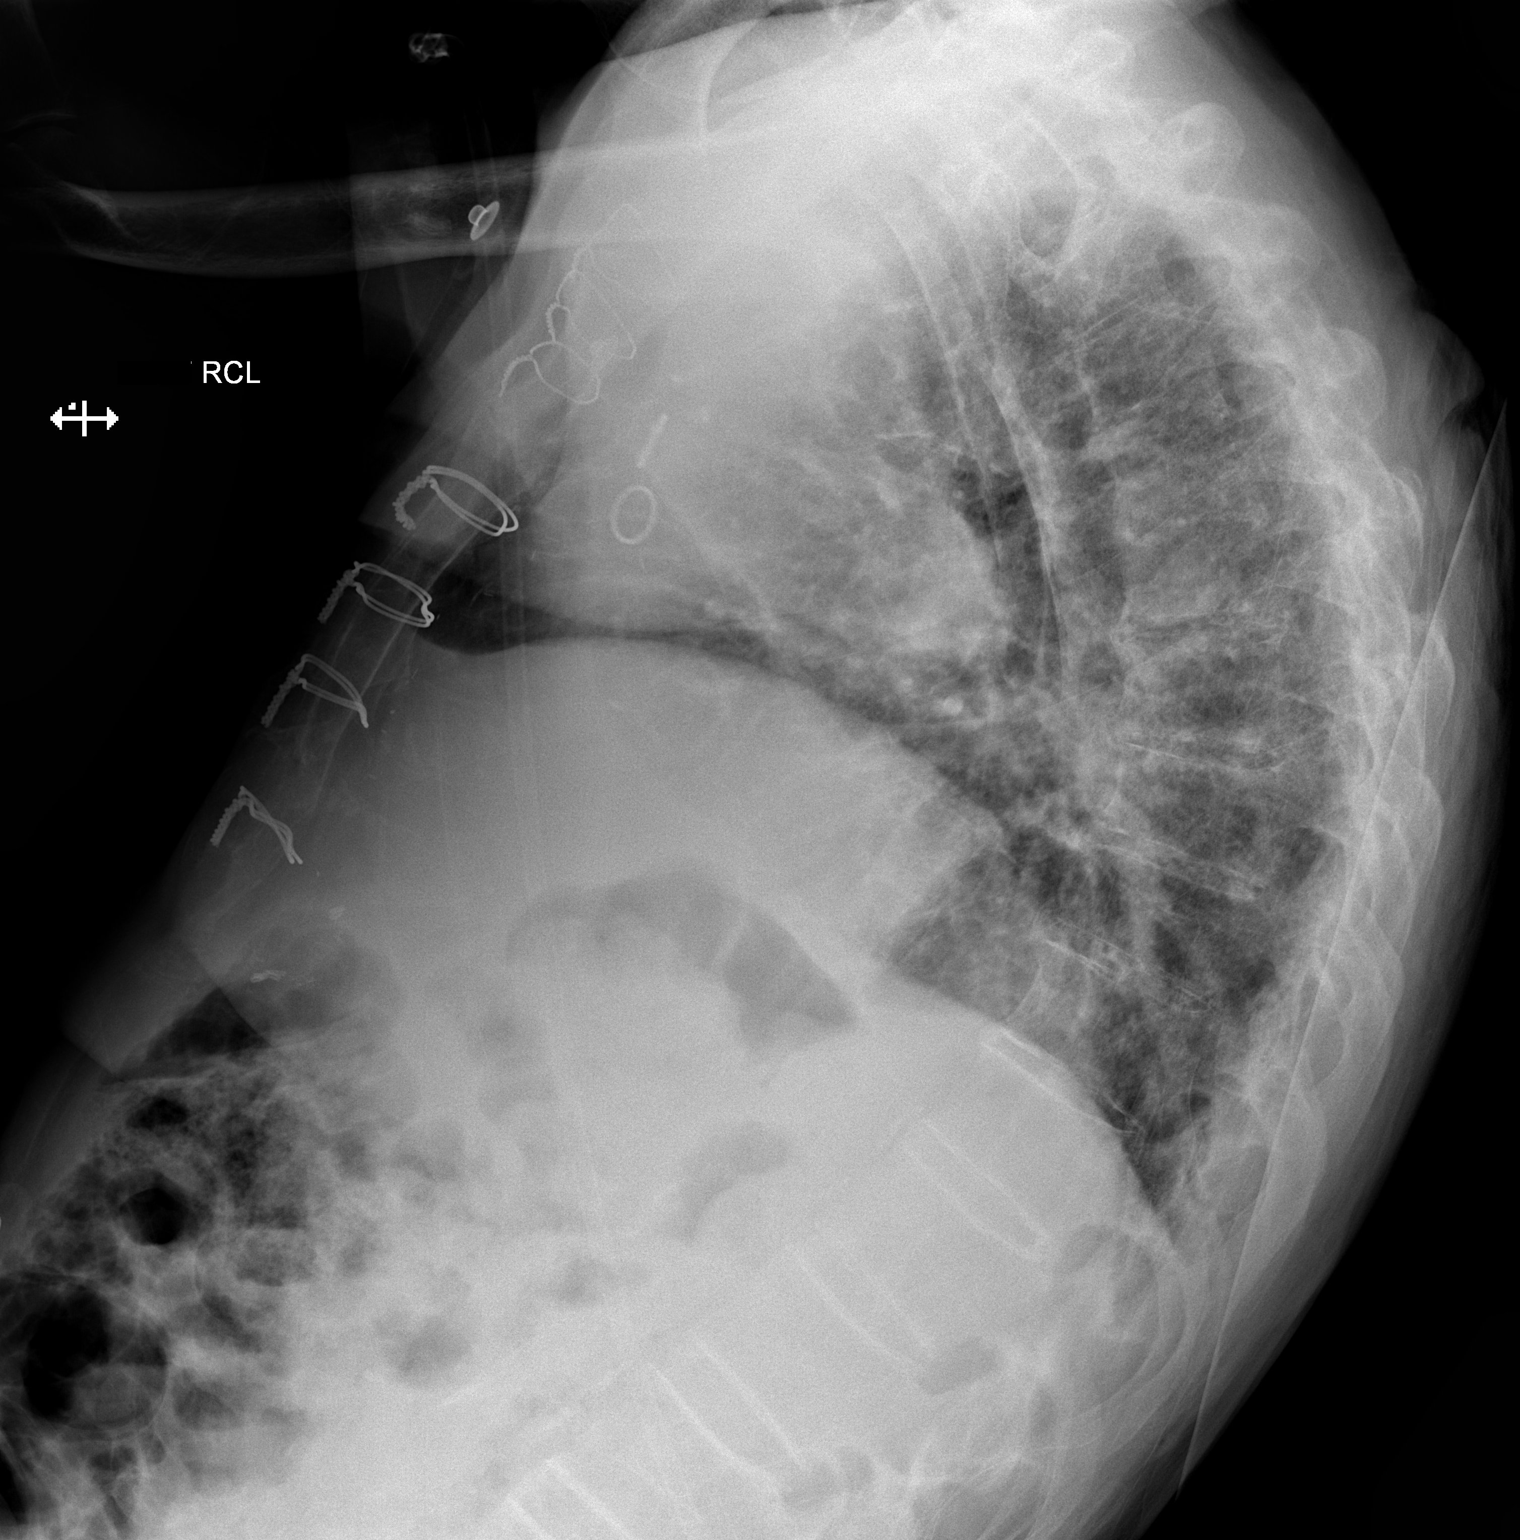

[2 of 2 positions shown; findings below may reference images not displayed]

FINDINGS: PA and lateral chest radiographs are provided. There is bilateral diffuse
interstitial thickening likely representing interstitial edema versus
interstitial pneumonitis secondary to an infectious or inflammatory
etiology. There is no focal parenchymal opacity, pleural effusion, or
pneumothorax. The heart size is enlarged. There is evidence of prior CABG..
The osseous structures are unremarkable.
IMPRESSION: There is bilateral diffuse interstitial thickening likely representing
interstitial edema versus interstitial pneumonitis secondary to an
infectious or inflammatory etiology.

[REDACTED]

## 2014-08-05 ENCOUNTER — Encounter: Payer: Self-pay | Admitting: Pulmonary Disease

## 2014-08-05 ENCOUNTER — Ambulatory Visit (INDEPENDENT_AMBULATORY_CARE_PROVIDER_SITE_OTHER): Payer: Medicare HMO | Admitting: Pulmonary Disease

## 2014-08-05 VITALS — BP 110/62 | HR 54 | Temp 97.0°F | Ht 70.0 in | Wt 192.0 lb

## 2014-08-05 DIAGNOSIS — J9611 Chronic respiratory failure with hypoxia: Secondary | ICD-10-CM

## 2014-08-05 DIAGNOSIS — R0689 Other abnormalities of breathing: Secondary | ICD-10-CM

## 2014-08-05 NOTE — Assessment & Plan Note (Signed)
The patient continues to do very well on his noninvasive positive pressure device. He feels that he sleeps adequately, and that it has helped his breathing as well.  I would like for him to continue on his device, and we'll get a three-month download from his home care company. It remains unclear whether this is a neuromuscular disease or central hypoventilation, but the family member tells me that he has siblings who are now experiencing very similar issues. Will check overnight oximetry on room air to see if he still needs oxygen, and I suspect that he does. If so, we can hopefully make travel easier for him with a loaner travel concentrator.

## 2014-08-05 NOTE — Progress Notes (Signed)
   Subjective:    Patient ID: Christian [name], male    DOB: 05-Mar-1941, 74 y.o.   MRN: [id number]  HPI The patient comes in today for follow-up of his known chronic respiratory failure, secondary to hypoventilation. It is unclear whether he has central hypoventilation, or some type of neuromuscular disease. His family member tells me that he has siblings who are starting to develop similar issues. He has been on noninvasive positive pressure ventilation, and has done exceptionally well. He sleeps better, and has better oxygenation with recruitment. He has no issues with his mask fit, and feels that it is very comfortable. He is aggravated with having to use oxygen at night, because it interferes with travel. It is difficult for him to carry the large concentrator with him.   Review of Systems  Constitutional: Negative for fever and unexpected weight change.  HENT: Negative for congestion, dental problem, ear pain, nosebleeds, postnasal drip, rhinorrhea, sinus pressure, sneezing, sore throat and trouble swallowing.   Eyes: Negative for redness and itching.  Respiratory: Positive for cough. Negative for chest tightness, shortness of breath and wheezing.   Cardiovascular: Negative for palpitations and leg swelling.  Gastrointestinal: Negative for nausea and vomiting.  Genitourinary: Negative for dysuria.  Musculoskeletal: Negative for joint swelling.  Skin: Negative for rash.  Neurological: Negative for headaches.  Hematological: Does not bruise/bleed easily.  Psychiatric/Behavioral: Negative for dysphoric mood. The patient is not nervous/anxious.        Objective:   Physical Exam Thin male in no acute distress Nose without purulence or discharge noted Neck without lymphadenopathy or thyromegaly Chest with very poor depth of inspiration, no wheezing Cardiac exam with mild tachycardia, 2/6 systolic murmur Lower extremities with 2+ edema, no cyanosis Alert and oriented, moves all 4  extremities.       Assessment & Plan:

## 2014-08-05 NOTE — Patient Instructions (Signed)
Will have [employer] get a download off your machine, and will let you know the results. Will check oxygen level one night on room air with your device to see if you still need oxygen at night. If you do, can see if [employer] can loan you a travel concentrator whenever you go out of town. followup with me again in 22mos

## 2014-08-07 IMAGING — CR DG CHEST 2V
1 series · 2 of 2 positions shown · non-contrast
Comparison: none

REASON FOR EXAM: chf
COMMENTS:

[Series 3: x chest ap · 0.14mm/px · 2 of 2 slices shown]
[im 1/2]
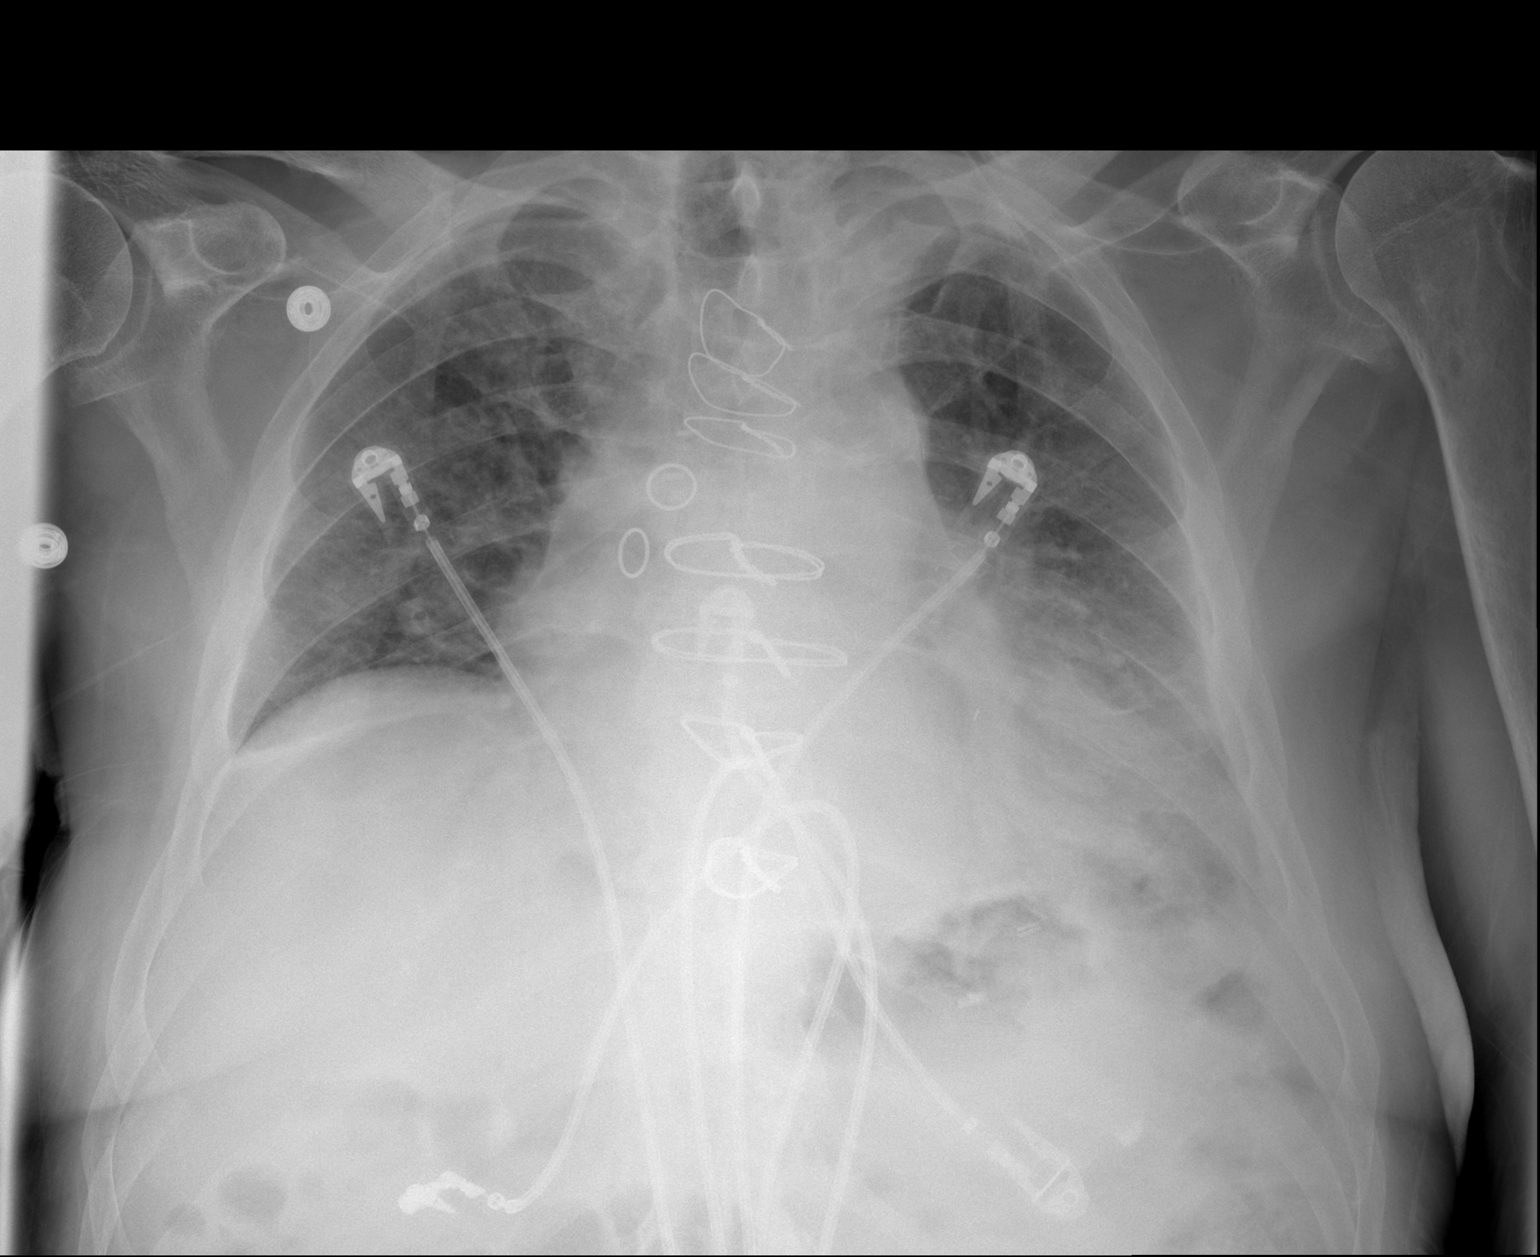
[im 2/2]
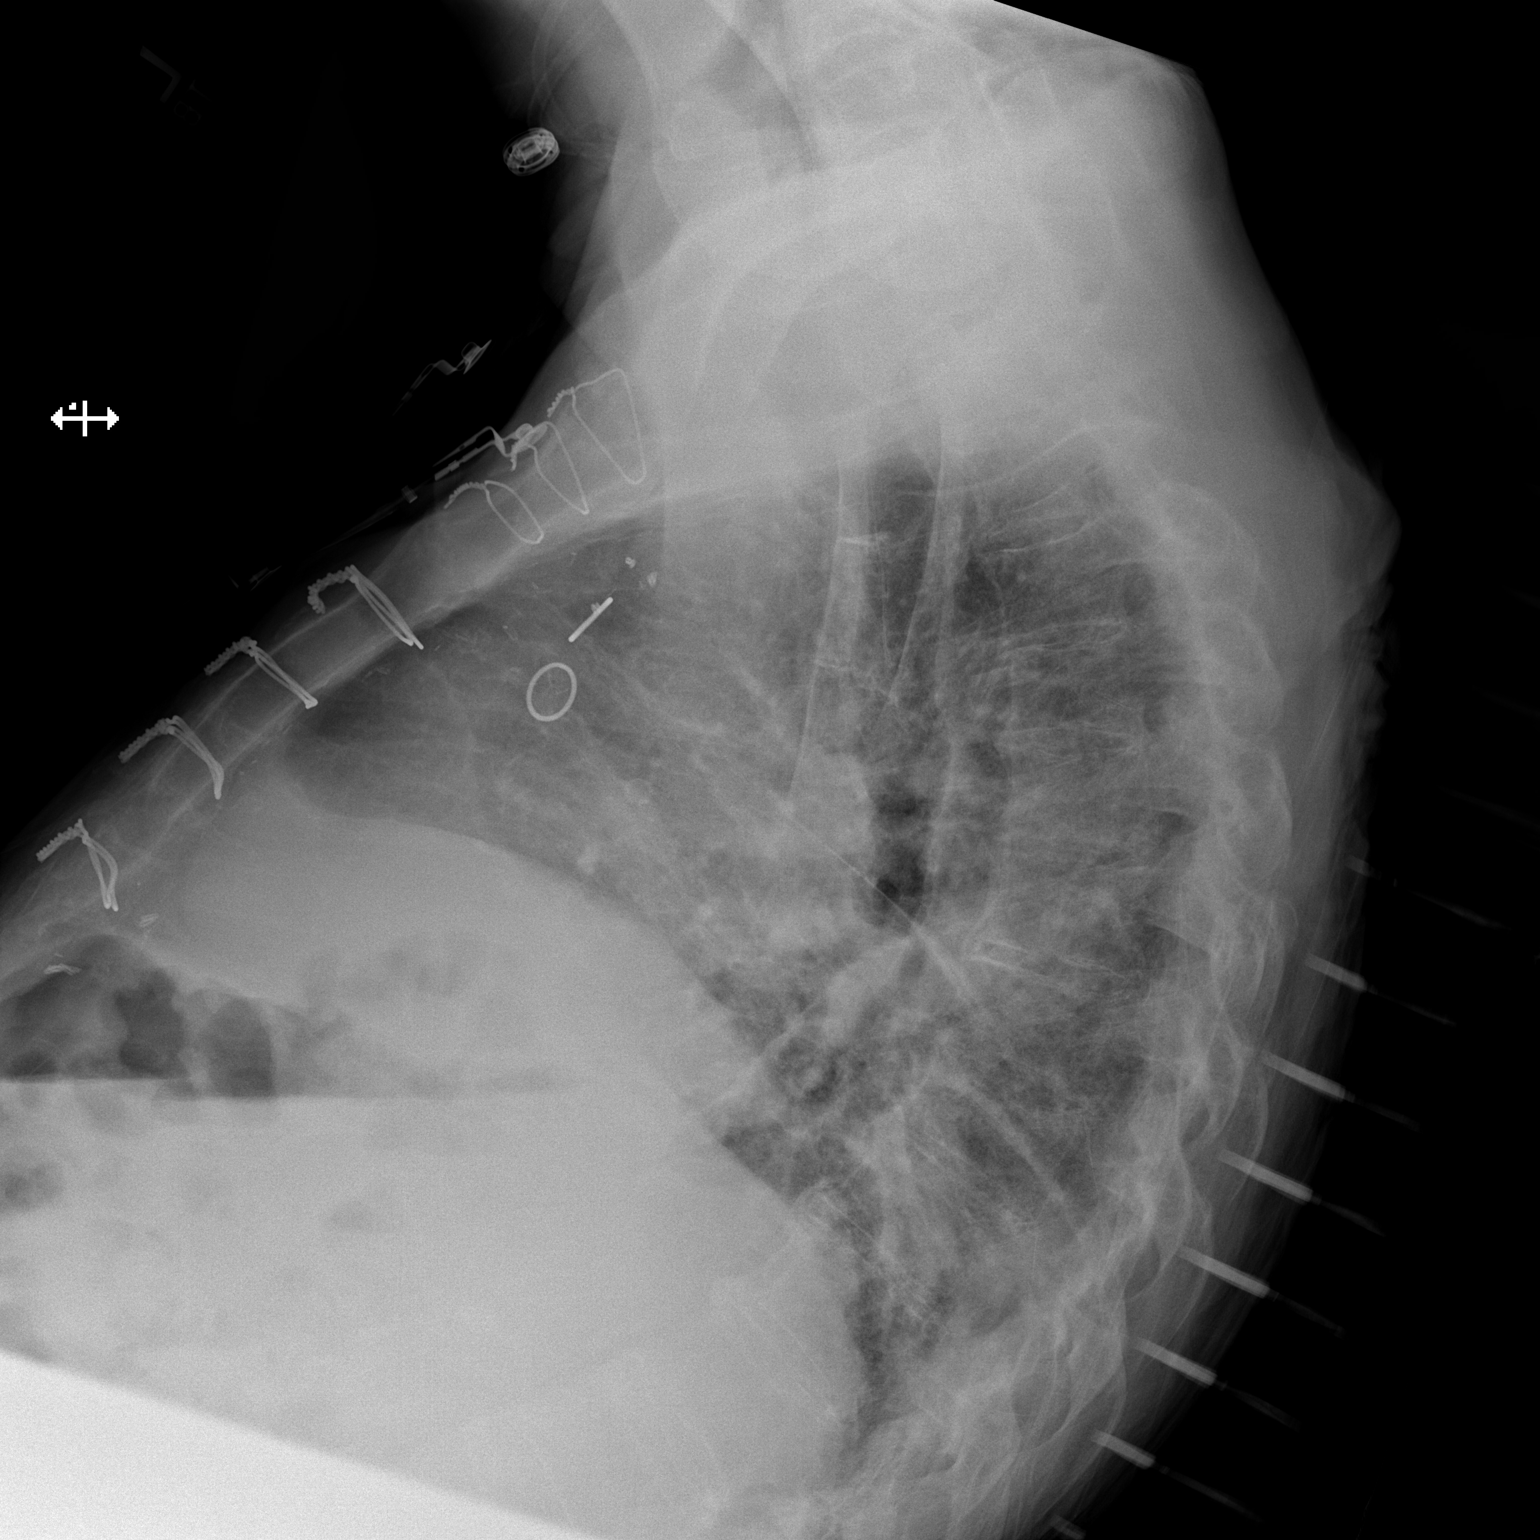

[2 of 2 positions shown; findings below may reference images not displayed]

PROCEDURE:     DXR - DXR CHEST PA (OR AP) AND LATERAL  - April 27, 2012  [DATE]

RESULT:     CABG changes are present. The cardiac silhouette is enlarged.
There is very shallow inflation. There is increased density in the posterior
gutters suggestive of basilar pneumonia versus atelectasis worse on the
left. No overt edema is seen. There is elevation of the right hemidiaphragm.
IMPRESSION: Cardiomegaly with CABG changes. At lung base pneumonia is a
concern. There is elevation of the right hemidiaphragm.

[REDACTED]

## 2014-08-07 IMAGING — US ABDOMEN ULTRASOUND LIMITED
1 series · 14 of 25 positions shown · non-contrast
Comparison: none

REASON FOR EXAM: elevated ammonia,INR, evaluate liver.
COMMENTS:   Body Site: Right Upper Quad

[Series 1: abdomen ultrasound limited · 0.23mm/px · 14 of 30 slices shown]
[im 1/30]
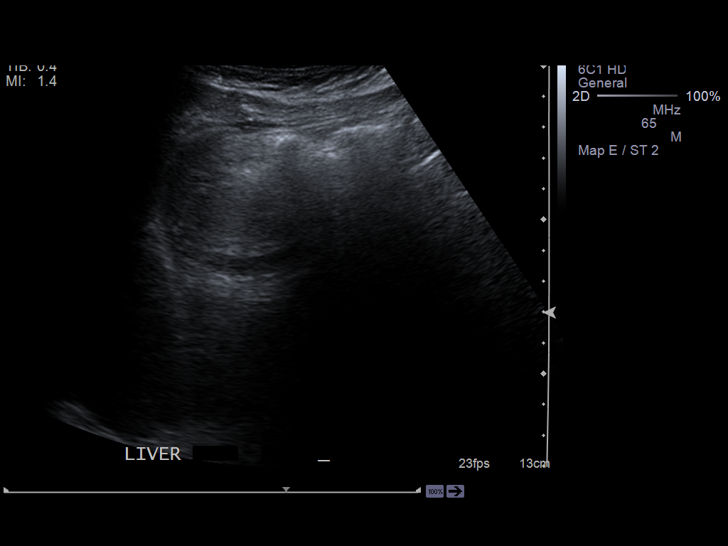
[im 3/30]
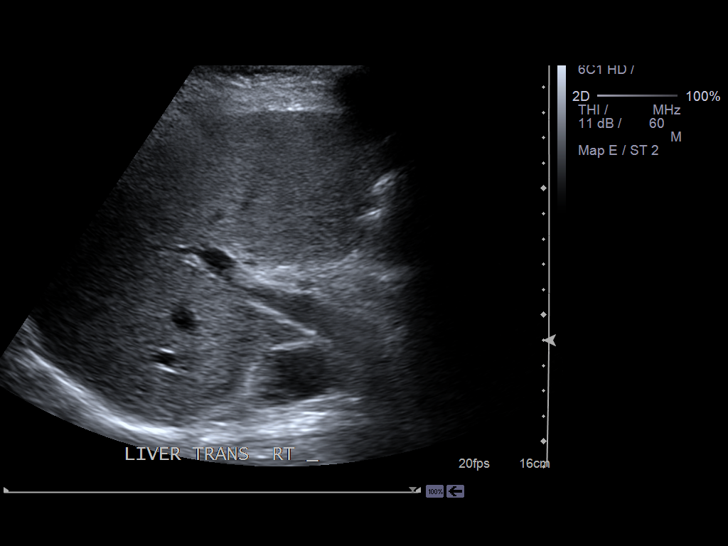
[im 5/30]
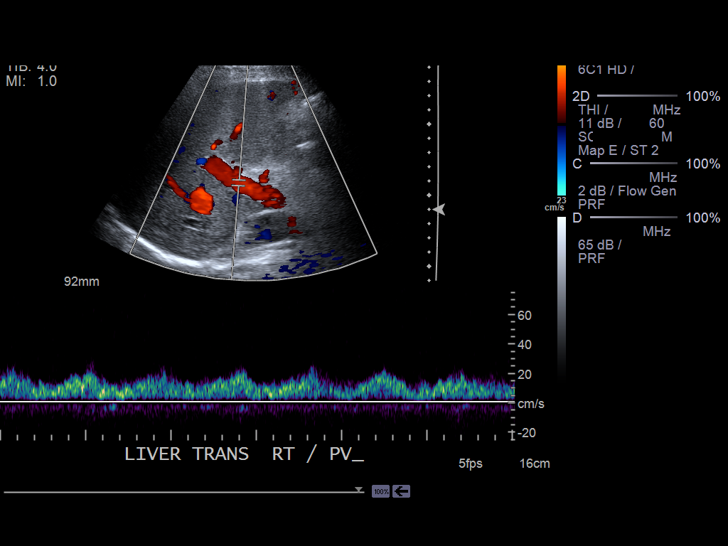
[im 8/30]
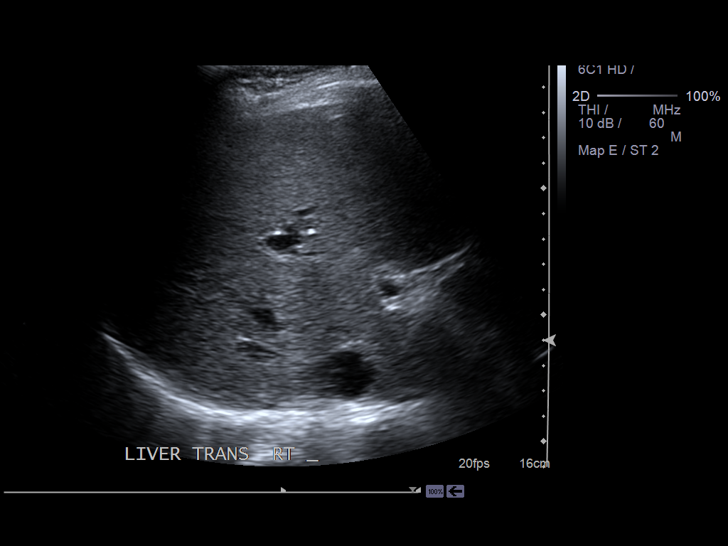
[im 10/30]
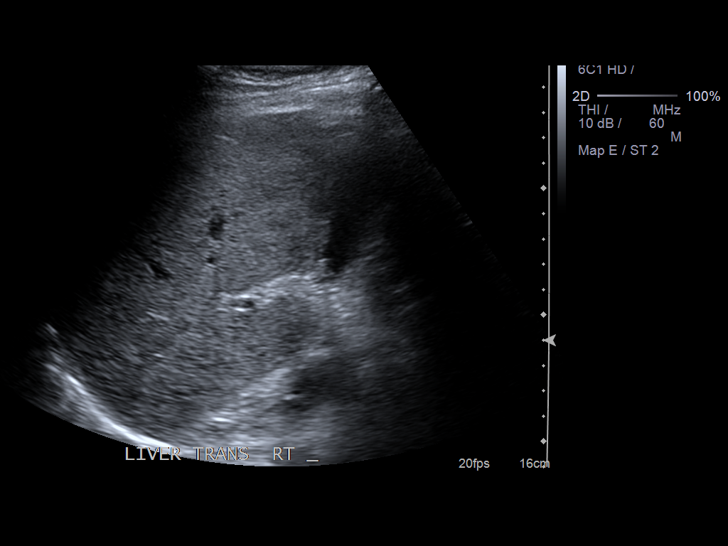
[im 11/30]
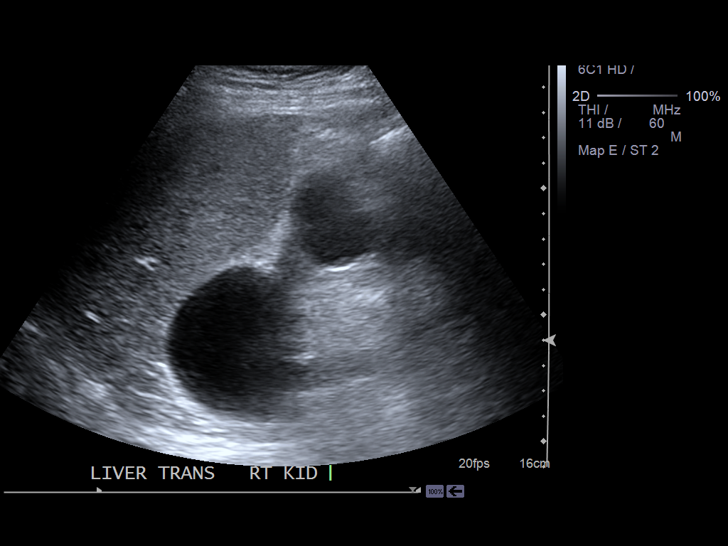
[im 14/30]
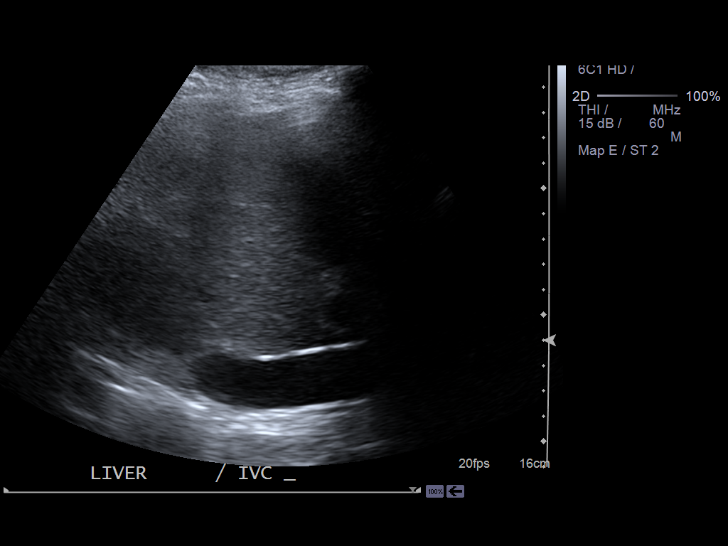
[im 16/30]
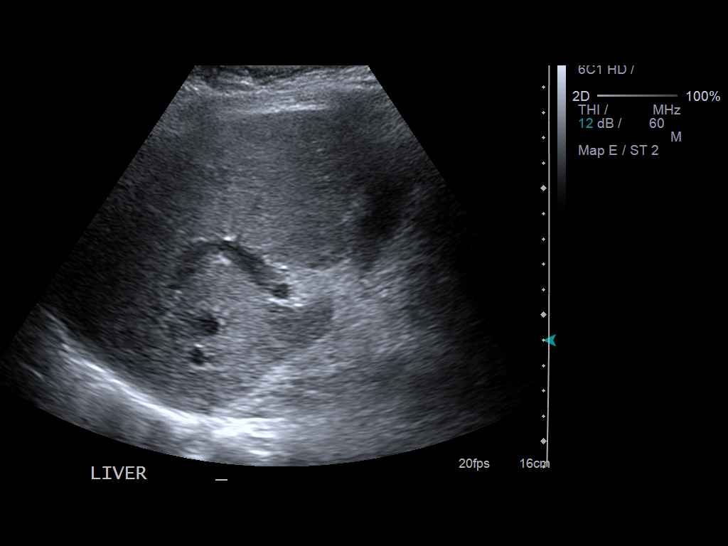
[im 19/30]
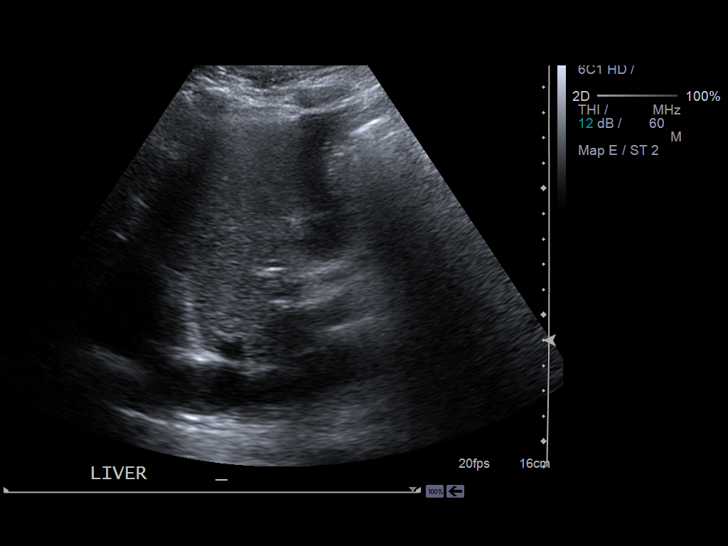
[im 20/30]
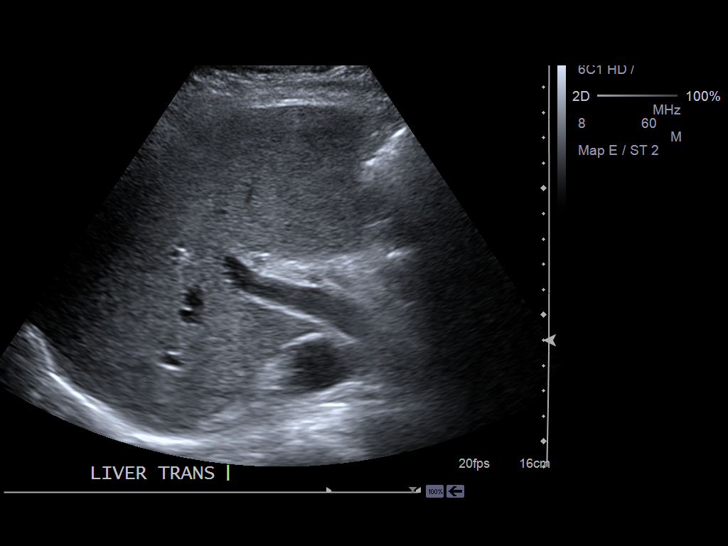
[im 22/30]
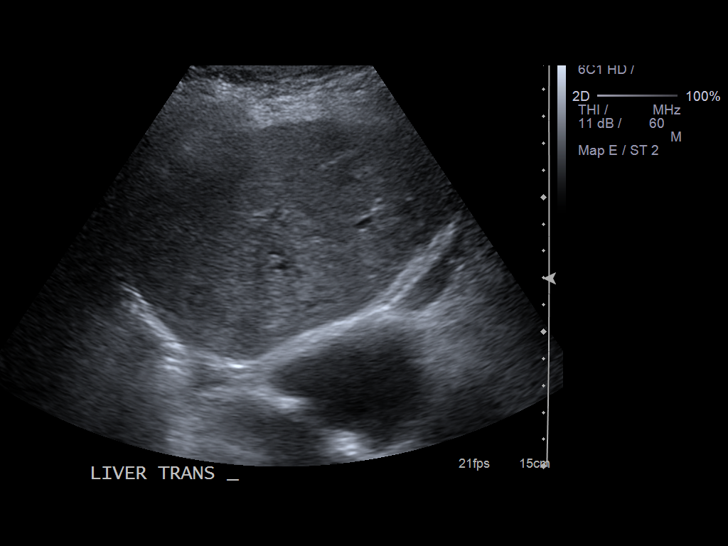
[im 25/30]
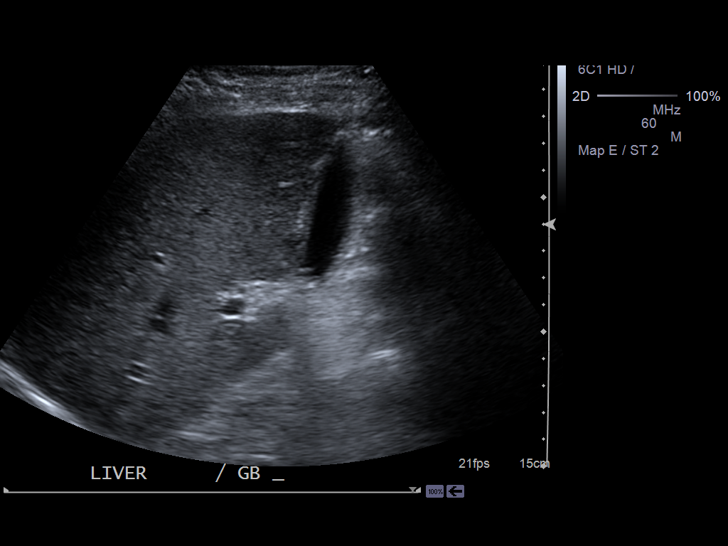
[im 27/30]
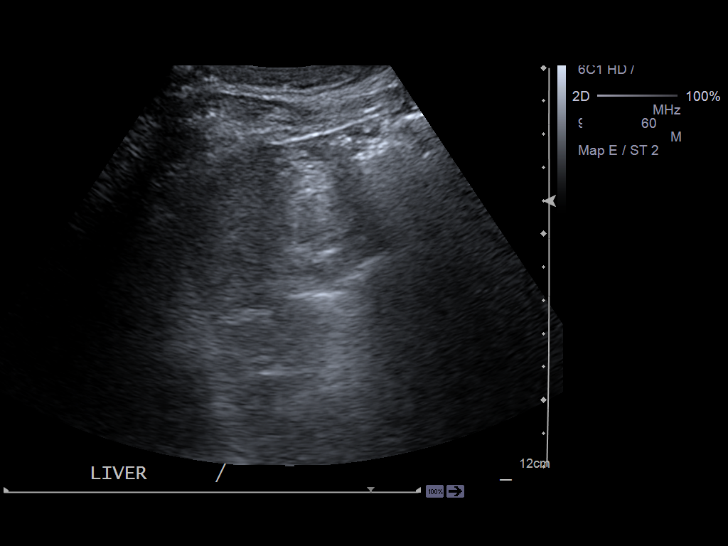
[im 30/30]
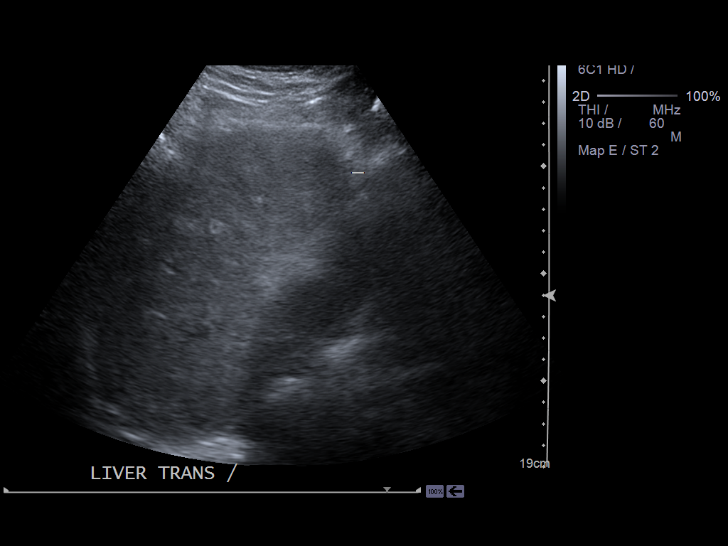

[14 of 25 positions shown; findings below may reference images not displayed]

PROCEDURE:     US  - US ABDOMEN LIMITED SURVEY  - April 27, 2012  [DATE]

RESULT:     Limited hepatic ultrasound is performed. The patient has little
mobility and could not roll into different positions. Portal venous flow
appears normal. The hepatic echotexture is coarse. There is no ductal
dilation evident. A definite mass is not appreciated. The liver length is
13.77 cm. Portions of the left liver not well seen because of bowel gas.
IMPRESSION: 1. Increased echotexture the liver without ductal dilation or mass. Limited
exam as discussed above.

[REDACTED]

## 2014-08-11 ENCOUNTER — Telehealth: Payer: Self-pay

## 2014-08-11 NOTE — Telephone Encounter (Signed)
L mom to schedule LE arterial doppler DX: claudication

## 2014-08-19 ENCOUNTER — Other Ambulatory Visit: Payer: Self-pay

## 2014-08-19 DIAGNOSIS — I739 Peripheral vascular disease, unspecified: Secondary | ICD-10-CM

## 2014-08-25 ENCOUNTER — Telehealth: Payer: Self-pay | Admitting: Pulmonary Disease

## 2014-08-25 NOTE — Telephone Encounter (Signed)
Please let pt know that I have reviewed his download and looks great.

## 2014-08-26 NOTE — Telephone Encounter (Signed)
Called [facility] and this will be faxed over

## 2014-08-26 NOTE — Telephone Encounter (Signed)
Don't see them.  See if we can get a copy of the results for my review.

## 2014-08-26 NOTE — Telephone Encounter (Signed)
I spoke with patient about results and he verbalized understanding. Also daughter aware of results. She states pt had ONO as well and is requesting these results. Dr. Gwenette Greet do you have these? thanks

## 2014-08-27 NOTE — Telephone Encounter (Signed)
Called [facility] back and advised never received results. This will be refaxed over to triage

## 2014-08-27 NOTE — Telephone Encounter (Signed)
Let pt know that he drops his oxygen level to 73%, and spends 248 min less than 88% when not wearing oxygen at night. He obviously needs to be on oxygen.  It will lengthen life and prevent heart complications.

## 2014-08-27 NOTE — Telephone Encounter (Signed)
Results received and placed in [facility] folder for ONO. Please advise thanks

## 2014-08-28 NOTE — Telephone Encounter (Signed)
Christian [name] (pt daughter) aware of results. She voiced understanding and needed nothing further

## 2014-08-28 NOTE — Telephone Encounter (Signed)
Pt daughter cb, [phone]

## 2014-08-28 NOTE — Telephone Encounter (Signed)
lmomtcb x1 

## 2014-09-04 ENCOUNTER — Encounter (INDEPENDENT_AMBULATORY_CARE_PROVIDER_SITE_OTHER): Payer: Medicare HMO

## 2014-09-04 ENCOUNTER — Telehealth: Payer: Self-pay | Admitting: *Deleted

## 2014-09-04 DIAGNOSIS — I739 Peripheral vascular disease, unspecified: Secondary | ICD-10-CM

## 2014-09-04 NOTE — Telephone Encounter (Signed)
Pt daughter came in for an apt with PV and was following up on a e-mail from [facility] about pt refill needed on [place]in[place]. Pt is on assistant program for this.  Pt is getting low on the medication, would like to know if we can restart that process.  Please advise.

## 2014-09-04 NOTE — Telephone Encounter (Signed)
Notified patient's daughter we need the forms to fill out in order to fax to the patient assistance program. Christian [name] we bring forms in when they have finished and will have [place] fax to patient assistance once we have filled out our part.

## 2014-09-15 ENCOUNTER — Encounter: Payer: Self-pay | Admitting: Cardiovascular Disease

## 2014-09-18 ENCOUNTER — Encounter: Payer: Self-pay | Admitting: Family Medicine

## 2014-09-18 ENCOUNTER — Other Ambulatory Visit: Payer: Self-pay | Admitting: Family Medicine

## 2014-09-18 ENCOUNTER — Encounter: Payer: Self-pay | Admitting: Pulmonary Disease

## 2014-09-18 MED ORDER — TRIAMCINOLONE ACETONIDE 0.1 % EX CREA: 1.0000 "application " | TOPICAL_CREAM | Freq: Two times a day (BID) | CUTANEOUS | Status: DC

## 2014-09-23 ENCOUNTER — Ambulatory Visit
Admit: 2014-09-23 | Disposition: A | Discharge status: Home / Self Care | Payer: Self-pay | Attending: Internal Medicine | Admitting: Internal Medicine

## 2014-09-24 ENCOUNTER — Other Ambulatory Visit: Payer: Self-pay

## 2014-09-24 MED ORDER — DRONEDARONE HCL 400 MG PO TABS: 400.0000 mg | ORAL_TABLET | Freq: Two times a day (BID) | ORAL | Status: DC

## 2014-09-24 NOTE — Telephone Encounter (Signed)
Spoke with Anderson [name] (daughter) 90 day supply with 3 refill sent to [facility] home delivery for multaq 400 mg take one tablet twice a day.

## 2014-10-03 ENCOUNTER — Other Ambulatory Visit: Payer: Self-pay | Admitting: Cardiovascular Disease

## 2014-10-10 ENCOUNTER — Ambulatory Visit
Admit: 2014-10-10 | Disposition: A | Discharge status: Home / Self Care | Payer: Self-pay | Attending: Internal Medicine | Admitting: Internal Medicine

## 2014-10-15 ENCOUNTER — Telehealth: Payer: Self-pay | Admitting: *Deleted

## 2014-10-15 NOTE — Telephone Encounter (Signed)
Multaq 400 mg samples placed at front desk for pick up.

## 2014-10-15 NOTE — Telephone Encounter (Signed)
Christian [name] called and needs samples of multaq 400 mg. Needs two weeks worth due to still waiting on [employer] to mail the prescription.

## 2014-10-28 NOTE — Consult Note (Signed)
PATIENT NAME:  Christian [name], Christian [name] MR#:  [id number] DATE OF BIRTH:  1941-02-23  DATE OF CONSULTATION:  04/27/2012  REFERRING PHYSICIAN:  Dr. Darvin Neighbours  CONSULTING PHYSICIAN:  Otelia Limes. Yves Dill, MD  REASON FOR CONSULTATION: Gross hematuria.   HISTORY OF PRESENT ILLNESS: Christian [name] is a 74 year old white male who presented to the Emergency Room with weakness and shortness of breath. He has a history of congestive heart failure and chronic obstructive pulmonary disease. While hospitalized, Foley catheter was placed to monitor urinary output and also to help him with his nocturia and nocturnal urgency. His catheter was removed earlier today and he developed gross hematuria after that. Prior to this episode he denied history of hematuria, flank pain or kidney stones. He does have some mild to moderate lower urinary tract symptoms at home which include urgency, frequency and nocturia. He is on Flomax 0.4 mg a day for benign prostatic hypertrophy.   ALLERGIES: Patient was allergic to Crestor, Pradaxa and Tramadol.    CURRENT MEDICATIONS:  1. Xarelto. 2. Tylenol. 3. Amiodarone. 4. Aspirin. 5. Cetirizine. 6. Digoxin. 7. Multivitamins.  8. P.R.N. nitroglycerin. 9. Omeprazole.  10. Potassium chloride.  11. Senokot. 12. Flomax. 13. Torsemide.   PAST SURGICAL HISTORY: Patient denied prior surgical procedures.   SOCIAL HISTORY: Patient quit smoking several years ago but has a greater than 20 pack-year history. He denied alcohol use.   FAMILY HISTORY: Negative for prostate cancer or kidney disease.   PAST AND CURRENT MEDICAL CONDITIONS:  1. Atrial fibrillation.  2. Hypertension.  3. Gastroesophageal reflux disease. 4. Benign prostatic hypertrophy. 5. Chronic obstructive pulmonary disease. 6. Congestive heart failure.   PHYSICAL EXAMINATION:  ABDOMEN: Soft. Bladder was not palpable.   RECTAL: 40 grams smooth, nontender prostate.   GENITOURINARY: Circumcised. Testes were atrophic, 12 mL in size  each.   LABORATORY, DIAGNOSTIC AND RADIOLOGICAL DATA: BUN 17, creatinine 1.28, hematocrit 27.3%.   IMPRESSION: Gross hematuria due to Foley trauma and exposure to Xarelto.  PLAN:  1. I would prefer to avoid recatheterization due to risk of further urethral trauma and discomfort unless urinary retention occurs. 2. To prevent clot retention, I would keep the patient well hydrated and use diuretics to promote vigorous urinary output.  3. Surgical intervention is very seldom necessary for Foley trauma, but reconsult if he develops retention.   ____________________________ Otelia Limes. Yves Dill, MD mrw:cms D: 04/27/2012 14:53:41 ET T: 04/27/2012 15:46:22 ET JOB#: [id number]  cc: Otelia Limes. Yves Dill, MD, <Dictator> Royston Cowper MD ELECTRONICALLY SIGNED 04/27/2012 22:28

## 2014-10-28 NOTE — Consult Note (Signed)
CC: iron def anemia.  Pt had IDA over 15 years ago and had neg colon and EGD.  He presented with this again.  He had urinary bleed and was transfused and now Hgb 9.5.   He should be on iron if possible.  Oral iron may constipate him but it is usually the first option.  Iv iron in form of iron sucrose is safe and effective but expensive.  I recommend one of these.  No other GI issues so i will sign off, reconsult if needed.  Electronic Signatures: Manya Silvas (MD)  (Signed on 21-Oct-13 17:37)  Authored  Last Updated: 21-Oct-13 17:37 by Manya Silvas (MD)
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
## 2014-10-28 NOTE — Consult Note (Signed)
PATIENT NAME:  Christian [name], Christian [name] MR#:  [id number] DATE OF BIRTH:  07/01/1941  DATE OF CONSULTATION:  04/26/2012  REFERRING PHYSICIAN:   CONSULTING PHYSICIAN:  Manya Silvas, MD  HISTORY OF PRESENT ILLNESS: The patient is a 74 year old white male who presented to the Emergency Room due to generalized weakness and shortness of breath. He has a history of COPD and congestive heart failure. He has been using BiPAP for several years. He was brought in because of failure to thrive with weakness, decreased eating and drinking. Upon evaluation he was found to have an iron deficiency anemia and I was asked to see him in consultation because of a possible GI source for his iron deficiency anemia.   The patient had an upper endoscopy with Dr. Hilarie Fredrickson in [place] and was told he had a small hiatal hernia. No other significant abnormalities. He had a colonoscopy on 12/31/2009 with Dr. Dionne Milo and he had internal hemorrhoids and a few diverticula of the sigmoid colon. The scope was advanced to the cecum and no evidence of colitis, colon polyps, or neoplasm was seen. This was a little over two years ago. Dr. Dionne Milo also do an upper endoscopy on 12/31/2009 that showed a normal esophagus, some mild gastritis as evidenced by mild erythema in the antrum.   The patient denies any rectal bleeding. He does have alternating constipation and diarrhea. He takes Senokot twice a day and MiraLAX every three days to keep his bowels moving. He does complain of some acid reflux sometimes after eating.   He and his family relate the onset of throat irritation and some mild dysphagia being due to Pradaxa that he was taking for atrial fibrillation. This was changed to Xarelto and the patient feels like some of the throat symptoms went away after this.   HABITS: Smoked cigarettes for 25 years, maybe a pack a day. Has a history of alcohol intake. He and his family are somewhat vague on the amount but the patient says he "has drank  his share". The patient was in the hospital a year ago and had an EEG that showed diffuse slowing.    The patient had some difficulty in answering questions. He is very slow to answer questions and at times needs help from his family. The patient denies any abdominal pain today and he has had bowel movement.   ALLERGIES: Crestor, Pradaxa, and tramadol.   CURRENT MEDICATIONS ON ADMISSION:  1. Tylenol 500 mg q.6 hours p.r.n.  2. Amiodarone 200 mg a day.  3. Aspirin 81 mg a day.  4. Cetirizine 10 mg daily.  5. Digoxin 0.125 mg a day.  6. Multivitamins one a day.  7. Sublingual nitroglycerin as needed.  8. Omeprazole 20 mg a day.  9. Potassium 10 mEq a day.  10. Xarelto 20 mg a day.  11. Senokot 8.6 mg daily.  12. Flomax 0.4 mg daily. 13. Torsemide 10 mg a day.   PHYSICAL EXAMINATION:   GENERAL: White male with oxygen on.   HEENT: Head atraumatic. Sclerae nonicteric. Conjunctivae negative.   HEART: 2/6 systolic murmur.   CHEST: Inspiratory crackles in the lower lung fields bilaterally.   ABDOMEN: Bowel sounds are present. No hepatosplenomegaly. No masses. No bruits. No tenderness.   LABS ON ADMISSION: Glucose 88, BUN 22, creatinine 1.7, sodium 137, potassium 4.4, chloride 91, bicarb 40. LFTs within normal limits. Troponin 0.04. Digoxin level is 2. White count 4.7, hemoglobin 10.2, hematocrit 32.3. Arterial blood gas showed pH 7.43, pCO2 68, pO2  54.   ASSESSMENT: The patient has had previous endoscopy and colonoscopy within the last two years. He denies any rectal bleeding. Review of other records made mention of endoscopy at [facility] on February 22nd showing moderate severe esophagitis. I cannot find any records of that. He at this time does not need an upper endoscopy. I would give him PPI twice a day. I would do stool Hemoccult cards to see if he indeed has any GI bleeding going on. I do not think he needs a colonoscopy with negative colonoscopy only two years ago. If he does have  esophagitis or gastritis flaring back up from earlier this year and this is cause for any anemia or trace of blood in the stool, then the treatment would be more aggressive PPI therapy.   I will follow with you.    ____________________________ Manya Silvas, MD rte:drc D: 04/26/2012 20:43:52 ET T: 04/27/2012 05:57:48 ET JOB#: [id number]  cc: Manya Silvas, MD, <Dictator> Minna Merritts, MD Manya Silvas MD ELECTRONICALLY SIGNED 05/02/2012 8:06

## 2014-10-28 NOTE — Consult Note (Signed)
PATIENT NAME:  Christian [name], Christian [name] MR#:  [id number] DATE OF BIRTH:  1941/07/07  DATE OF CONSULTATION:  05/01/2012  REFERRING PHYSICIAN:   CONSULTING PHYSICIAN:  Sakira Dahmer K. Shylin Keizer, MD  SUBJECTIVE: The patient was seen in his room with his daughter who is a very reliable historian.    OBJECTIVE: Today the patient is seen sitting up with oxygen, in the room, on his bed. He is adequately dressed in hospital clothes. Grooming is fair and improved with hair combed and face is shaven.  He appears calm, pleasant, and cooperative.  In fact the daughter reports that he is feeling much better than he was yesterday, 04/30/2012, as his breathing is much better and he feels much more comfortable.  In fact he was joking and was humorous and stated to the undersign "I had 175 pounds weight off my shoulders" and smiled while talking about the same.    As stated above, alert and oriented, pleasant and cooperative. No agitation. Affect is appropriate with his mood which is low and down because of feeling of loneliness when he goes home and is a very friendly man and wants to make friends and wants to be in company of other people. Denies feeling hopeless or helpless, denies feeling worthless or useless, denies any ideas to hurt himself or others. No evidence of psychosis.  Memory is intact. Cognition is intact. General knowledge and information is fair. Insight and judgment are fair and adequate.   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   PLAN: The patient and daughter refuse to be put on any antidepressive medications as they feel that he can work through his depression with adequate help from the community and social services.  I discussed with daughter about the availability of various senior citizen programs in the community where the patient can participate so that he does not feel lonely and he will have  company of other senior citizens with whom he can relate. The daughter agrees to the  same and she will contact social services for evaluation of such programs in the community and keep company with other citizens.  ____________________________ Wallace Cullens. Franchot Mimes, MD skc:slb D: 05/01/2012 14:12:41 ET T: 05/01/2012 14:32:59 ET JOB#: [id number]  cc: Arlyn Leak K. Franchot Mimes, MD, <Dictator> Dewain Penning MD ELECTRONICALLY SIGNED 05/05/2012 16:22

## 2014-10-28 NOTE — Discharge Summary (Signed)
PATIENT NAME:  Christian [name], ARAKAWA MR#:  [id number] DATE OF BIRTH:  1940/10/19  DATE OF ADMISSION:  04/23/2012 DATE OF DISCHARGE:  05/02/2012  ADMITTING DIAGNOSIS: Acute on chronic respiratory failure.   DISCHARGE DIAGNOSES:  1. Acute on chronic respiratory failure. 2. Chronic obstructive pulmonary disease exacerbation.  3. Congestive heart failure, left heart, acute on chronic, diastolic.  4. Obstructive sleep apnea on bilateral positive airway pressure at home.  5. Acute renal failure, resolved, likely diuretic related.  6. Hematuria due to Foley trauma, resolving.  7. Anemia of acute blood loss, status post 1 unit of packed red blood cell transfusion.  8. Atrial fibrillation, on Xarelto at home chronically.  9. Altered mental status due to hepatic encephalopathy and hypercarbia, carbon dioxide retention due to chronic obstructive pulmonary disease.  10. History of gastroesophageal reflux disease.  11. Benign prostatic hypertrophy. 12. Generalized weakness due to hypercarbia as well as hepatic encephalopathy. 13. Mild skin candidiasis. 14. Hyperglycemia with hemoglobin A1c in the past 36.1.    DISCHARGE CONDITION: Fair.   DISCHARGE MEDICATIONS: The patient is to resume his outpatient medications which are:  1. Cetirizine 10 mg p.o. daily.  2. Multivitamin once daily.  3. Nitrostat 0.4 mg sublingually every 5 minutes as needed.  4. Omeprazole 20 mg p.o. daily.  5. Tamsulosin 0.4 mg p.o. daily.  6. Acetaminophen 500 mg every 6 hours as needed, not more than 2 grams in 24 hours.  7. Prednisone 10 mg tablets, 5 tablets, which would be 50 mg p.o. once on 05/03/2012, then taper x10 mg daily until stop.  8. Diazepam 2 mg, 1 tablet every 4 hours as needed for muscle jerking or clonus.  9. Metoprolol tartrate 25 mg p.o. b.i.d.  10. Albuterol/ipratropium 2.5/0.5 mg in 3 mL inhalation solution, 1 inhalation q.i.d.  11. Nystatin topical cream 1 application every 12 hours as needed.  12. Iron  sulfate 325 mg p.o. daily.  13. Lactulose 10 grams in 15 mL oral syrup, 30 mL b.i.d.  14. Budesonide 0.5 mg in 2 mL inhalation, 1 inhaler b.i.d.  15. Betapace 80 mg tablet, 1/2 tablets which would be 40 mg p.o. b.i.d.  16. Furosemide 20 mg p.o. daily.   NOTE: The patient is not to take amiodarone, digoxin, potassium chloride, Senna or torsemide at this point.   HOME OXYGEN: Portable tank at 1 liter of oxygen through nasal cannula to keep pulse oximeter at 88% to 90%. The respiratory therapist is advised to wean off oxygen as tolerated.   DIET: 2 grams salt, low fat, low cholesterol, carbohydrate controlled diet, mechanical soft with thin liquids. No straws, strict aspiration precautions, medications in pureed diet, aspiration precautions, small single sips and bites one at a time and clear mouth well between bites, eat and drink slowly, sit full upright when eating and drinking, reduce distractions during meals, medications in puree.   ACTIVITY LIMITATIONS: As tolerated.   REFERRALS: Physical therapy as well as occupational therapy 2 to 7 times a week.  FOLLOWUP:   1. Follow-up appointment with Dr. [name] in 2 days after discharge.  2. Follow-up appointment with Dr. Rockey Situ in 2 days after discharge.    CONSULTANTS:  1. Dr. Gaylyn Cheers. 2. Dr. Camie Patience. 3. Dr. Mariane Duval.  4. Dr. Ida Rogue.  5. Dr. Verdie Shire. 6. Dr. Verdie Shire. 7. Dr. Minus Breeding.  8. Dr. Murlean Iba.  9. Dr. Maryan Puls. 10. Dr. Valora Corporal. 56. Dr. Glean Salen. 12. Dr. Kathlyn Sacramento.  LABORATORY, DIAGNOSTIC AND RADIOLOGICAL DATA: Chest x-ray, PA and lateral on 04/23/2012, showed diffuse bilateral interstitial thickening likely representing interstitial edema versus interstitial pneumonitis secondary to an infectious or inflammatory etiology. Chest, PA and lateral on 04/27/2012 showed cardiomegaly with CABG changes. Lung base pneumonia is a concern. There is elevation of the  right hemidiaphragm. Portable single view chest x-ray on 04/28/2012 revealed no significant interval change in appearance of chest since prior study. Portable single view chest x-ray on 04/29/2012 revealed findings suggesting mild improvement of interstitial edema which may be of cardiac or noncardiac cause. No focal pneumonia demonstrated. A follow-up PA and lateral would be useful when the patient is able to tolerate this procedure. CT scan of chest without contrast on 04/24/2012 revealed findings which are consistent with congestive heart failure and small bilateral pleural effusions, interstitial edema, and fluid in the fissures. No classic evolving pneumonia is demonstrated. 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 Gallstones are present. There are cystic-appearing changes in the upper poles of both kidneys according to the radiologist. Ultrasound of the abdomen, limited survey on 04/27/2012, revealed increased echotexture of liver without ductal dilatation or mass. Limited exam. On arrival to the Emergency Room 04/23/2012, lab data revealed elevated BUN and creatinine of 22 and 1.73, B-type natriuretic peptide was 1959. CO2 was markedly elevated at 40. Estimated GFR for non-African American would be 39.0. Liver enzymes were unremarkable. Cardiac enzymes, first set was normal. CK total levels were normal. TSH was normal at 2.92. Digoxin level was 2.02. White blood cell count was normal at 4.7, hemoglobin 10.2, platelet count 238. MCV was low at 77. Iron studies were done and showed iron level low at 35, iron saturation was only 9, and ferritin level was 89.0. Ammonia level was elevated at 52.0.  HISTORY AND PHYSICAL: The patient is a 74 year old Caucasian male with past medical history significant for history of tobacco and alcohol abuse in the past, history of chronic obstructive pulmonary disease as well as possible  underlying liver disease, who presented to the hospital with complaints of weakness as well as shortness of breath. Please refer to Dr. Edward Jolly  admission note on 04/23/2012.   On arrival to the hospital, the patient's temperature was 97.1, pulse was 71, respiration rate was 30, blood pressure 177/70, saturation was 99% on BiPAP. Physical exam revealed coarse bibasilar crackles, otherwise, no dullness to percussion. Negative use of accessory muscles.   HOSPITAL COURSE: The patient was admitted to the hospital with acute on chronic respiratory failure due to congestive heart failure. He was diuresed, and with this his condition somewhat improved; however, he remained somewhat weak and with muscle weakness as well as Foley catheter resulting in hematuria led him to the Intensive Care Unit. He was managed, however, very carefully on BiPAP intermittently, also on high flow nasal cannulas with improvement of his condition. He was consulted by a cardiologist, also pulmonary critical care physician as well as neurologist because of his muscle weakness. He had numerous studies done to evaluate for possible muscle weakness, and he was found to have negative major ones which are mitochondrial antibodies 5.5 units which are negative, anti-smooth muscle antibodies 6 units which are negative, antinuclear antibodies direct which were positive, hepatitis C antibodies were negative. Hepatitis profile I were negative. Vitamin B12 in serum level was high at 1781, alkaline phosphatase enzymes liver fraction was 70%, bone fraction was 30%, intestinal fraction 0%.  Acetylcholine receptor antibodies less than 12%, which were negative. Aldolase in serum level was high at 10.9. Voltage-gated calcium channel antibodies results are pending. Prealbumin level is low at 14. Protein immunoelectrophoresis was unremarkable with no spike. Immunoelectrophoresis in urine also no spike was observed.   The patient was diuresed as well as treated  for possible chronic obstructive pulmonary disease exacerbation with inhalation therapy and steroids, and his condition improved. He was also treated for hepatic encephalopathy and, as mentioned above, hypercarbia due to chronic obstructive pulmonary disease. It was felt that the patient's acute on chronic respiratory failure is very likely related to congestive heart failure, left heart, acute on chronic diastolic. The patient had echocardiogram done during this admission, and it revealed left ventricular systolic function normal, ejection fraction equal or more than 55%. There was noted to be moderate concentric left ventricular hypertrophy. Right ventricular systolic function was found to be normal. Left atrium was moderately dilated. Mild valvular aortic stenosis was noted. Diastolic function could not be determined due to underlying atrial fibrillation. It was felt that the patient should be continued on medications, however, amiodarone as well as digoxin were stopped. Amiodarone was stopped because of concerns of interference with his lung function. Digoxin was stopped because of his renal insufficiency. The patient was managed on beta blockers while in the hospital, and the cardiologist recommended sotalol to be introduced starting 05/03/2012. The patient is to follow up with a cardiologist, Dr. Rockey Situ, in the next one week after discharge. He is to continue diuretics, Lasix, which worked for him well while he was in the hospital course. Caution should be taken not to give him high doses of diuretics because of concerns about renal insufficiency as well as metabolic alkalosis.   In regards to chronic obstructive pulmonary disease exacerbation, the patient is to continue antibiotic therapy as well as steroids and inhalation therapy. He finished his antibiotic therapy. He is to continue steroid tapering as well as inhalation therapy. He is to continue oxygen therapy as needed to be weaned down to room air.  The patient is to be weaned down to room air with the goal oxygen saturations at 88 to 90%. The patient is to continue BiPAP at home or facility for his known history of obstructive sleep apnea.   In regards to acute renal failure, the patient's kidney function improved when his diuretics were stopped. As mentioned above, on arrival to the Emergency Room the patient's creatinine was 1.73. However, it improved to normal level of 0.95 on the day of discharge, 05/02/2012. It is recommended to follow the patient's creatinine levels as outpatient as well as his BUN levels and sodium as well as potassium levels as he is being actively diuresed. Make decisions about changing his diuretics depending on his needs.   In regards to hematuria, the patient sustained mild Foley trauma injury. It was felt to be related to Xarelto. The patient's hematuria was significant in the sense that it required 1 unit of packed red blood cell transfusion due to acute anemia; however, the patient's hematuria subsided, and by the day of discharge it almost disappeared. The patient continued to have mild microscopic hematuria. On the day of discharge, the patient had a few red blood cells in his urinalysis. It is recommended to recheck the patient's urinalysis in approximately one week and make decisions about restarting his aspirin as well as Xarelto as outpatient.   In regards to altered mental status, it was felt to be due to hepatic  encephalopathy. As mentioned above, the patient's ammonia level was elevated; unspecified liver pathology was diagnosed by gastroenterologist, could have been related to alcohol abuse. The patient improved significantly with lactulose. He is to continue lactulose, and his condition should be followed by his primary care physician. It was felt that the patient's altered mental status was also relates to hypercarbia which in turn was due to chronic obstructive pulmonary disease. The patient is to continue  BiPAP as mentioned above due to his obstructive sleep apnea, and he is to continue oxygen therapy as needed only. His oxygen should be discontinued if his pulse oximetry remains stable at around 88% to 90% at rest or on exertion.   For atrial fibrillation, the patient's digoxin as well as amiodarone were discontinued. The patient was started on metoprolol. The patient's heart rate remained stable. On the day of discharge, the patient's heart rate was in the 60s to 70s. The patient's Xarelto was not restarted at this point yet because of continued mild microscopic hematuria.   For generalized weakness, the patient received physical therapy while he was in the hospital. The patient is being discharged to a rehabilitation facility for ongoing physical as well as occupational therapy.   The patient was noted to have lower back mild candidiasis. He is to use nystatin cream if needed. For hyperglycemia, the patient should be continued on a diabetic diet. For gastroesophageal reflux disease, he is to continue proton pump inhibitors.   The patient is being discharged in stable condition with the above-mentioned medications and follow-up. His vital signs on the day of discharge are as follows: Temperature 97.5, pulse 79, respiratory rate 18, blood pressure ranging from 96 to 790 systolic and 38B to 33O diastolic, oxygen saturation 95 to 96% on room air at rest.   TIME SPENT: 40 minutes.  ____________________________ Theodoro Grist, MD rv:cbb D: 05/02/2012 14:43:25 ET T: 05/02/2012 15:35:18 ET JOB#: [id number]  cc: Theodoro Grist, MD, <Dictator> Elveria Rising. Damita Dunnings, MD Theodoro Grist MD ELECTRONICALLY SIGNED 05/25/2012 12:31

## 2014-10-28 NOTE — Consult Note (Signed)
CC: iron def anemia.  Recommend do stool hemocults times 3 specimens.  His ABG on admission was showing severe CO2 retention and he had an abnormal CXR.  I would repeat both of these today or tomorrow.  Would check retic count.  Await SPIE.  Unless cardiopul status improves he is likely not a candidate for endoscopic evaluation.  Suggest cardiac echo if not done.  Electronic Signatures: Manya Silvas (MD)  (Signed on 17-Oct-13 09:44)  Authored  Last Updated: 17-Oct-13 09:44 by Manya Silvas (MD)

## 2014-10-28 NOTE — Consult Note (Signed)
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  h/o profound esophagitis, weight loss of more than 50 pounds, anorexia, lack of interest, food getting stuck in his throat, difficulty swallowing dry mouth, periods of hallucinations and memory problems. He presented to the hospital at [facility] September 02 2011 following an EGD that showed severe esophagitis, moderate gastritis,. He had mental status changes, shortness of breath, elevated cardiac enzymes. Blood gases showed hypercapnia and respiratory depression and he required BiPAP. It was initially felt his respiratory issues were secondary to sedation at the time of his EGD though he required respiratory support for several days.  Echocardiogram showed normal LV systolic function greater than 55%, borderline elevated right ventricular systolic pressures pulmonic aortic valve stenosis. He had cardioversion  for atrial fibrillation which was successful though in followup he converted back to atrial fibrillation. he had issues with weight gain and fluid buildup recently which was treated with torsemide. He continued to have worsening of renal function with a peak creatinine recently of 1.7 with elevated bicarbonate of unclear etiology. He was evaluated by Dr. Lake Bells in pulmonary clinic and was felt not to have significant COPD. The patient presented with increased dyspnea and lower extremity edema. He was initially hydrated due to renal failure but respiratory status did not change. There was a concern about possible amiodarone-induced lung toxicity. He had a CT scan done which was most suggestive of heart failure and  fluid overload. he had significant desaturation with ambulation today   Physical Exam:   GEN critically ill appearing    NECK supple  positive JVD    RESP no use of accessory muscles  crackles    CARD Regular rate and rhythm  Murmur    Murmur Systolic    ABD denies tenderness    EXTR positive edema    PSYCH alert, A+O to time, place, person   Review of Systems:   Subjective/Chief Complaint dyspnea and lower extremity edema.    General: Fatigue    ENT: Decreased hearing    Eyes: No Complaints    Neck: No Complaints    Respiratory: Short of breath    Cardiovascular: Dyspnea  Orthopnea  Edema    Gastrointestinal: No Complaints    Genitourinary: No Complaints    Vascular: No Complaints    Musculoskeletal: No Complaints    Neurologic: Dizzness    Hematologic: Ease of bruising    Endocrine: No Complaints    Psychiatric: No Complaints   Lab Results: Cardiology:  15-Oct-13 09:23    Echo Doppler  Interpretation Summary   Left ventricular systolic function is normal. Ejection Fraction =  >55%. There is moderate concentric left ventricular hypertrophy. The  right ventricular systolic function is normal. The left atrium is  moderately dilated. Mild valvular aortic stenosis. Diastolic  function could not be determined due to underlying atrial  fibrillation.   PatientHeight: 180 cm   PatientWeight: 81 kg   BSA: 2.0 m2  Procedure:   A two-dimensional transthoracic echocardiogram with color flow and  Doppler was performed.  Left Ventricle   Ejection Fraction = >55%.   No regional wall motion abnormalities noted.   The left ventricle is normal in size.  There is moderate concentric left ventricular hypertrophy.   Left ventricular systolic function is normal.  Right Ventricle   The right ventricle is normal size.   There is normal right ventricular wall thickness.   The right ventricular systolic function is normal.  Atria   The left atrium is  moderately dilated.   Right atrial size is normal.  Mitral Valve   There is moderate mitral annular calcification.   No significant mitral valve stenosis.   There is trace mitral regurgitation.  Tricuspid Valve   The tricuspid valve is not well visualized, but is grossly normal.   No significant tricuspid stenosis.   There is trace tricuspid regurgitation.  Aortic Valve   No aortic regurgitation is present.   Mild valvular aortic stenosis.   Moderately calcified aortic valve.  Pulmonic Valve Trace pulmonic valvular regurgitation.   The pulmonic valve is not well seen, but is grossly normal.   There is no pulmonic valvular stenosis.  Great Vessels   The aortic root is not well visualized.  Pericardium/Pleural   Trivial pericardialeffusion.  MMode 2D Measurements and Calculations   RVDd: 2.6 cm   IVSd: 1.0 cm   LVIDd: 4.9 cm   LVIDs: 2.8 cm   LVPWd: 1.4 cm   FS: 42 %   EF(Teich): 73 %   Ao root diam: 2.2 cm   LA dimension: 5.7 cm   LVOT diam: 2.1 cm  Doppler Measurements and Calculations   MV E point: 113 cm/sec   MV A point: 41 cm/sec   MV E/A: 2.8    MV dec time: 0.15 sec   Ao V2 max: 264 cm/sec   Ao max PG: 28 mmHg   Ao V2 mean: 176 cm/sec   Ao mean PG: 15 mmHg   Ao V2 VTI: 59 cm   AVA(I,D): 1.2 cm2   AVA(V,D): 1.1 cm2   LV max PG: 3.0 mmHg   LV mean PG: 2.0 mmHg   LV V1 max: 84 cm/sec   LV V1 mean: 60 cm/sec   LV V1 VTI: 20 cm   SV(LVOT): 70 ml   PA V2 max: 135 cm/sec   PA max PG: 7.0 mmHg   TR Max vel: 262 cm/sec   TR Max PG: 27 mmHg   RVSP: 32 mmHg   RAP systole: 5.0 mmHg  Reading Physician: Kathlyn Sacramento  Sonographer: Sherrie Sport Interpreting Physician:  Kathlyn Sacramento,  electronically signed on  04-24-2012 17:41:31 Requesting Physician: Kathlyn Sacramento  Routine Chem:  15-Oct-13 04:25    Glucose, Serum 81   BUN  19   Creatinine (comp)  1.50   Sodium, Serum 140   Potassium, Serum 3.8   Chloride, Serum  94   CO2, Serum  42   Calcium  (Total), Serum  8.1   Anion Gap  4   Osmolality (calc) 281   eGFR (African American)  54   eGFR (Non-African American)  47 (eGFR values <36m/min/1.73 m2 may be an indication of chronic kidney disease (CKD). Calculated eGFR is useful in patients with stable renal function. The eGFR calculation will not be reliable in acutely ill patients when serum creatinine is changing rapidly. It is not useful in  patients on dialysis. The eGFR calculation may not be applicable to patients at the low and high extremes of body sizes, pregnant women, and vegetarians.)   Result Comment CO2 - RESULTS VERIFIED BY REPEAT TESTING.  - CRITICAL VALUE PREVIOUSLY NOTIFIED.  - TPL  Result(s) reported on 24 Apr 2012 at 04:56AM.  Routine Hem:  15-Oct-13 04:25    WBC (CBC) 4.1   RBC (CBC)  3.89   Hemoglobin (CBC)  9.4   Hematocrit (CBC)  29.6   Platelet Count (CBC) 213   MCV  76   MCH  24.2   MCHC  31.8   RDW  15.9   Neutrophil % 67.9   Lymphocyte % 13.8   Monocyte % 12.4   Eosinophil % 4.8   Basophil % 1.1   Neutrophil # 2.8   Lymphocyte #  0.6   Monocyte # 0.5   Eosinophil # 0.2   Basophil # 0.0 (Result(s) reported on 24 Apr 2012 at 04:56AM.)   EKG:   EKG NSR    Interpretation one ECG showed accelerated junctional rhythm   Radiology Results: Cardiology:    15-Oct-13 09:23, Echo Doppler   Echo Doppler    Interpretation Summary    Left ventricular systolic function is normal. Ejection Fraction =   >55%. There is moderate concentric left ventricular hypertrophy. The   right ventricular systolic function is normal. The left atrium is   moderately dilated. Mild valvular aortic stenosis. Diastolic   function could not be determined due to underlying atrial   fibrillation.    PatientHeight: 180 cm    PatientWeight: 81 kg    BSA: 2.0 m2    Procedure:    A two-dimensional transthoracic echocardiogram with color flow and   Doppler was performed.    Left Ventricle    Ejection Fraction =  >55%.    No regional wall motion abnormalities noted.    The left ventricle is normal in size.    There is moderate concentric left ventricular hypertrophy.    Left ventricular systolic function is normal.    Right Ventricle    The right ventricle is normal size.    There is normal right ventricular wall thickness.    The right ventricular systolic function is normal.    Atria    The left atrium is moderately dilated.    Right atrial size is normal.    Mitral Valve    There is moderate mitral annular calcification.    No significant mitral valve stenosis.    There is trace mitral regurgitation.    Tricuspid Valve    The tricuspid valve is not well visualized, but is grossly normal.    No significant tricuspid stenosis.    There is trace tricuspid regurgitation.    Aortic Valve    No aortic regurgitation is present.    Mild valvular aortic stenosis.    Moderately calcified aortic valve.    Pulmonic Valve  Trace pulmonic valvular regurgitation.    The pulmonic valve is not well seen, but is grossly normal.    There is no pulmonic valvular stenosis.    Great Vessels    The aortic root is not well visualized.    Pericardium/Pleural    Trivial pericardialeffusion.    MMode 2D Measurements and Calculations    RVDd: 2.6 cm    IVSd: 1.0 cm    LVIDd: 4.9 cm    LVIDs: 2.8 cm    LVPWd: 1.4 cm    FS: 42 %    EF(Teich): 73 %    Ao root diam: 2.2 cm    LA dimension: 5.7 cm    LVOT diam: 2.1 cm    Doppler Measurements and Calculations    MV E point: 113 cm/sec    MV A point: 41  cm/sec    MV E/A: 2.8     MV dec time: 0.15 sec    Ao V2 max: 264 cm/sec    Ao max PG: 28 mmHg    Ao V2 mean: 176 cm/sec    Ao mean PG: 15 mmHg    Ao V2 VTI: 59 cm    AVA(I,D): 1.2 cm2    AVA(V,D): 1.1 cm2    LV max PG: 3.0 mmHg    LV mean PG: 2.0 mmHg    LV V1 max: 84 cm/sec    LV V1 mean: 60 cm/sec    LV V1 VTI: 20 cm    SV(LVOT): 70 ml    PA V2 max: 135 cm/sec    PA max PG: 7.0  mmHg    TR Max vel: 262 cm/sec    TR Max PG: 27 mmHg    RVSP: 32 mmHg    RAP systole: 5.0 mmHg    Reading Physician: Kathlyn Sacramento   Sonographer: Sherrie Sport  Interpreting Physician:  Kathlyn Sacramento,  electronically signed on   04-24-2012 17:41:31  Requesting Physician: Kathlyn Sacramento    Pradaxa: Angioedema  Tramadol: Other  Crestor: Unknown  Vital Signs/Nurse's Notes: **Vital Signs.:   15-Oct-13 13:18   Vital Signs Type Routine   Temperature Temperature (F) 98.4   Celsius 36.8   Temperature Source Oral   Pulse Pulse 62   Respirations Respirations 18   Systolic BP Systolic BP 798   Diastolic BP (mmHg) Diastolic BP (mmHg) 70   Mean BP 88   Pulse Ox % Pulse Ox % 94   Pulse Ox Activity Level  At rest   Oxygen Delivery 2L     Impression 1. Acute on chronic diastolic heart failure. 2. Atrial fibrillation currently in sinus rhythm with episodes of accelerated junctional rhythm and slightly elevated digoxin level. 3. CO2 retention of unclear etiology 4. chronic kidney disease with gradual worsening over the last 6 months.    Plan The patient does appear to be fluid overloaded by physical exam. Also BNP was elevated and CT scan was suggestive of heart failure. Thus, I agree with IV furosemide with careful monitoring of renal function. This CO2 retention and metabolic alkalosis is still not of clear etiology.. There has been some suggestion of neuromuscular disease. I will request neurology and nephrology consultation. I will hold his digoxin and continue amiodarone for atrial fibrillation.   Electronic Signatures: Kathlyn Sacramento (MD)  (Signed 15-Oct-13 21:07)  Authored: General Aspect/Present Illness, History and Physical Exam, Review of System, Labs, EKG , Radiology, Allergies, Vital Signs/Nurse's Notes, Impression/Plan   Last Updated: 15-Oct-13 21:07 by Kathlyn Sacramento (MD)

## 2014-10-28 NOTE — Discharge Summary (Signed)
PATIENT NAME:  Christian [name], Christian [name] MR#:  [id number] DATE OF BIRTH:  05/27/1941  DATE OF ADMISSION:  04/23/2012 DATE OF DISCHARGE:  05/03/2012  ADDENDUM:   HOSPITAL COURSE: The patient was not discharged yesterday on 05/02/2012 because he became more short of breath and somewhat more somnolent, obtunded after enema he was given yesterday in the afternoon. Today he is more awake, however, he is still somewhat slower to respond, has no confusion and is more comfortable. He has, however, recurrent muscle twitching, however, he has no significant problems with breathing. His oxygen needs are somewhat increased. He requires now 2 liters of oxygen through nasal cannula as opposed to 1 liter that he was using yesterday, and oxygen saturations remained somewhat tenuous. His oxygen saturation is 82% on 1 liter and 94% on 2 liters of oxygen through nasal cannula. It was unclear, however, why he would have so sudden onset of generalized weakness as well as altered mental status; but it was felt that very likely it was hypercarbia or hypoalbuminemia to blame. I feel that whenever the patient was removed he probably had some more difficult expelling CO2 and possibly had some CO2 retention which cleared up by now. I explained to the family that the patient will likely need a break in the middle of the day to nap with BiPAP on to help him with ongoing tiredness. He also would benefit from nocturnal oximetry to be checked with BiPAP.  I feel that the patient has end-stage chronic obstructive pulmonary disease, and this was also explained to the family. Overall, I feel that the patient would benefit from Palliative Care counseling and possibly even Hospice followup as outpatient because of his ongoing problems.   In regards to acute on chronic respiratory failure, again as mentioned above, this was felt to be multifactorial, chronic obstructive pulmonary disease as well as congestive heart failure-related. He was diuresed. Most  recent chest x-ray done on 04/29/2012 showed improvement of interstitial edema which would be of cardiac or noncardiac cause. No focal pneumonia was noted. We did not repeat any more chest x-rays because of continuing the same therapy and no signs of infection at this point. He was given Lasix here in the hospital, however, we were going to change him to furosemide upon discharge to rehabilitation. The patient will need to have his ins and outs followed as well as his clinical status for volume status and advance his furosemide as needed.   In regards to CO2 retention, as mentioned above the patient has a 20 pack-year smoking history and he has chronic retention. His baseline pCO2 is probably around 60s to 70s. He again needs intermittent naps with BiPAP to facilitate CO2 removal in the middle of the day to help him with ongoing tiredness.   In regards to chronic atrial fibrillation, the patient's atrial fibrillation rate, the patient's atrial fibrillation was rate controlled. The patient was taken off amiodarone as well as digoxin by Dr. Rockey Situ. He is not on flecainide because of contraindications; however, he was started on metoprolol. His heart rate is in the 70s today. The patient would benefit  to resume his Xarelto in about one week. He was Xarelto temporarily due to ureteral bleeding. I suggested repeating urinalysis in the next few days after discharge and restart Xarelto as well as aspirin if no hematuria is noted on urinalysis.   For benign prostatic hypertrophy, he is to continue Flomax as well as finasteride. For gastroesophageal reflux disease, the patient is to continue Prilosec.  The patient is more comfortable today. He had a bowel movement yesterday with enema. He is to continue lactulose for his constipation.   DISCHARGE VITAL SIGNS:  His vital signs are stable on the day of discharge, 05/03/2012. Temperature 97.9, pulse 73, respiration rate 20, blood pressure 131/72, saturation 94% on 2  liters of oxygen through nasal cannula.   TIME SPENT: 40 minutes.   ____________________________ Theodoro Grist, MD rv:cbb D: 05/03/2012 12:37:22 ET T: 05/03/2012 13:01:32 ET JOB#: [id number]  cc: Theodoro Grist, MD, <Dictator> Elveria Rising. Damita Dunnings, MD Theodoro Grist MD ELECTRONICALLY SIGNED 05/25/2012 12:31

## 2014-10-28 NOTE — Consult Note (Signed)
CC: iron def anemia.  His rectal exam today showed lite brown stool with heme neg test at bedside by me.  He may need iron supplement oral or iv.  He has alternating constipation and diarrhea but mostly constipation which he takes stool softeners and Miralax.  The oral iron may make him constipated.  IV iron sucrose is expensive but safe to give.  Absolutely no indication at this time for any GI procedures.  Iron def can occur in patients with anemia of chronic disease (COPD) (CHF) from poor recycling of iron unless supplements are used. Would give him PPI bid due to possible hx of esophagitis.  He has a strange affect, hx of ETOH use in past, abnormal EEG in 08/2011.  His severe CO2 retention.  Would we benefit from PFT's?  Dr. Candace Cruise on call for weekend but will not see unless called.  Electronic Signatures: Manya Silvas (MD)  (Signed on 18-Oct-13 08:49)  Authored  Last Updated: 18-Oct-13 08:49 by Manya Silvas (MD)

## 2014-10-28 NOTE — Consult Note (Signed)
PATIENT NAME:  Christian [name], CORTRIGHT MR#:  [id number] DATE OF BIRTH:  [birth year]  DATE OF CONSULTATION:  04/25/2012  REFERRING PHYSICIAN:  Marlyne Beards, MD CONSULTING PHYSICIAN:  Teren Franckowiak Lilian Kapur, MD  REASON FOR CONSULTATION: Acute renal failure, dehydration, and hypercapnia.   HISTORY OF PRESENT ILLNESS: The patient is a pleasant 74 year old Caucasian male with past medical history of coronary artery disease status post coronary artery bypass graft, chronic atrial fibrillation, hypertension, benign prostatic hypertrophy, gastroesophageal reflux disease, history of hypercapnic respiratory failure, and chronic obstructive pulmonary disease who presented to [facility] with weakness, dizziness, and shortness of breath. The patient reported that since last week he had periods of weakness as well as dizziness. These symptoms were confirmed by his daughter who is currently at the bedside. Upon evaluation the patient was found to have acute renal failure with a creatinine of 1.73. His baseline creatinine from February of this year was 0.87. The patient was started on gentle IV fluid hydration and his creatinine is now down to 1.20. However, there is also some concern regarding an elevated serum bicarbonate level. Serum bicarbonate level is currently 41. However, his baseline appears to be within the 40s. He also appears to have iron deficiency anemia with a hemoglobin of 9.0 and low MCV of 76. Iron saturation was also quite low at 9%.   PAST MEDICAL HISTORY:  1. Coronary artery disease status post coronary artery bypass graft.  2. Hypertension.  3. Atrial fibrillation.  4. Benign prostatic hypertrophy.  5. Gastroesophageal reflux disease.  6. History of hypercapnic respiratory failure.  7. Chronic obstructive pulmonary disease.  8. Generalized debility.   PAST SURGICAL HISTORY: Coronary artery bypass graft.   ALLERGIES: Crestor, Primaxin, and tramadol.     CURRENT INPATIENT  MEDICATIONS:  1. Acetaminophen 650 mg p.o. every four hours p.r.n.  2. Amiodarone 200 mg daily. 3. Aspirin 81 mg daily.  4. Cetirizine 10 mg daily.  5. Multivitamin 1 capsule p.o. daily.  6. Nitroglycerin 0.4 mg sublingual every five minutes p.r.n. chest pain.  7. Prilosec 20 mg p.o. every 6:00 a.m.  8. Zofran 4 mg IV every four hours p.r.n.  9. Potassium chloride 10 mEq p.o. daily.  10. Senna 1 tablet p.o. daily.  11. Flomax 0.4 mg p.o. daily.  12. Furosemide 40 mg IV every 12 hours.  13. Rivaroxaban 20 mg p.o. every 5:00 p.m.   SOCIAL HISTORY: The patient lives alone in a retirement community. He is divorced. He has two adult children. He used to work at [employer] in [employer]. The patient does have a history of tobacco abuse and smoked from age 27 to 51. He has 25-pack-year history of tobacco abuse. No alcohol or illicit drug use.   FAMILY HISTORY: The patient's mother is deceased from complications from a brain aneurysm. The patient's father died secondary to complications of congestive heart failure. No reported end-stage renal disease in the family.   REVIEW OF SYSTEMS: CONSTITUTIONAL: The patient denies fevers, chills. Has had some malaise and dizziness. EYES: Reports some blurry vision at times. HEENT: Denies headaches, hearing loss, tinnitus, epistaxis, or sore throat. CARDIOVASCULAR: Has history of known coronary artery disease status post coronary artery bypass graft, but currently denies chest pain or palpitations. RESPIRATORY: Denies cough or hemoptysis. Does have chronic shortness of breath. GI: Denies nausea or vomiting, but has had some decreased appetite over the past several days. GU: Denies frequency, urgency, or dysuria. MUSCULOSKELETAL: Denies joint pain, swelling, or redness. INTEGUMENTARY: Denies skin  rashes or lesions. NEUROLOGIC: Denies focal weakness but does report generalized weakness. PSYCHIATRIC: Denies depression or bipolar disorder. ENDOCRINE: Denies polyuria,  polydipsia, or polyphagia. HEMATOLOGIC/LYMPHATIC: The patient is noted to be anemic and has iron deficiency. ALLERGY/IMMUNOLOGIC: Has history of seasonal allergies.     PHYSICAL EXAMINATION:   VITAL SIGNS: Temperature 97.4, pulse 65, respirations 19, blood pressure 149/73, and pulse oximetry 90% on 2 liters.   GENERAL: Chronically ill-appearing male currently in no acute distress.   HEENT: Normocephalic, atraumatic. Extraocular movements are intact. Pupils are equal, round, and reactive to light. No scleral icterus. Conjunctivae are pale. No epistaxis noted. Gross hearing intact. Oral mucosa moist.   NECK: Supple without JVD or lymphadenopathy.   LUNGS: Minimal basilar rales and normal respiratory effort.   HEART: S1 and S2, harsh 3/6 systolic ejection murmur heard throughout the precordium.   ABDOMEN: Soft, nontender, and nondistended. Bowel sounds positive. No rebound or guarding. No gross organomegaly appreciated.   EXTREMITIES: No clubbing or cyanosis. 1+ bilateral lower extremity edema noted.   NEUROLOGIC: The patient is alert and oriented to time, person, and place. Strength is five out of five in both upper and lower extremities.   SKIN: Warm and dry. No rashes noted.   MUSCULOSKELETAL: No joint redness, swelling, or tenderness appreciated.   GU: No suprapubic tenderness encountered at this time.   PSYCHIATRIC: The patient has an appropriate affect, however, he appears to have limited insight into his current illness.   LABS/RADIOLOGIC STUDIES: Sodium 138, potassium 4.1, chloride 92, CO2 41, BUN 13, and creatinine 1.2. BNP 1688. Iron level 35 and iron saturation 9%. Magnesium 1.8. TSH 2.92. WBC 4.8, hemoglobin 9, hematocrit 29, platelets 195, and MCV 76.   Urinalysis from February 2013 was negative for protein.   ABG shows pH of 7.43, pCO2 elevated at 68, and pO2 54.  2-D echocardiogram shows ejection fraction greater than 55%. There is left ventricular hypertrophy. Left  atrium is moderately dilated. Mild valvular aortic stenosis is noted.   IMPRESSION/RECOMMENDATIONS: This is a 74 year old Caucasian male with past medical history of coronary artery disease status post coronary artery bypass graft, chronic atrial fibrillation, hypertension, benign prostatic hypertrophy, gastroesophageal reflux disease, history of hypercapnic respiratory failure with baseline serum bicarbonate in the 40s, and chronic obstructive pulmonary disease who presented to [facility] with weakness and dizziness and found to have acute renal failure. 1. Acute renal failure. The patient's baseline creatinine appears to be 0.87 from February 2013. His presenting creatinine was 1.73 and is now down to 1.20. EGFR is greater than 60 at present. We will obtain a renal ultrasound to exclude obstruction, although this is unlikely given improvement in his renal function. We will also obtain SPEP, UPEP, and ANA for further work-up.  2. Iron deficiency anemia. Iron saturation is noted to be quite low at 9%. Serum iron level is also low at 35 and he has a low MCV. Would certainly consider iron supplementation, however, I will defer this to the hospitalist. In addition, the patient is noted to be on Xarelto and this raises the specter of occult gastrointestinal bleeding. I will discuss this further with Dr. Paul Sink.  3. Chronic respiratory failure. The patient is noted to have hypercapnia with a normal pH. I suspect that there is underlying chronic respiratory acidosis that is compensated with metabolic alkalosis. The patient does wear BiPAP at night. He is also followed by Dr. Lake Bells as an outpatient.   I would like to thank Dr.  Marlyne Beards for this kind referral. Further plan as the patient progresses. ____________________________ Tama High, MD mnl:slb D: 04/25/2012 10:53:54 ET     T: 04/25/2012 11:17:57 ET       JOB#: [id number] cc: Tama High, MD,  <Dictator> Tama High MD ELECTRONICALLY SIGNED 05/22/2012 10:44

## 2014-10-28 NOTE — Consult Note (Signed)
Chief Complaint:   Subjective/Chief Complaint Asked to see patient for elevated ammonia level. Pt HOH. Due to face mask, difficult to understand. Dr. Vira Agar seeing patient for Fe def anemia. No Gi sxs except for alternating diarrhea/constipation at home. According to family, somewhat lethargic in AM but by afternoon, patient alert. No prior hx of liver disease but U/S does show coarse echo texture of liver. No alcohol hx. Started lactulose. LFT normal. Hx of gastritis and diverticulosis by EGD/Colon in 2011.   VITAL SIGNS/ANCILLARY NOTES: **Vital Signs.:   20-Oct-13 07:31   Vital Signs Type Q 4hr   Temperature Temperature (F) 98   Celsius 36.6   Temperature Source axillary   Pulse Pulse 68   Pulse source if not from Vital Sign Device per cardiac monitor   Respirations Respirations 42   Systolic BP Systolic BP 283   Diastolic BP (mmHg) Diastolic BP (mmHg) 60   Mean BP 84   Pulse Ox % Pulse Ox % 97   Oxygen Delivery Non-invasive ventilation (CPAP/BIPAP)   Pulse Ox Heart Rate 68   Telemetry pattern Cardiac Rhythm Normal sinus rhythm; pattern reported by Telemetry Clerk   Brief Assessment:   Cardiac Regular    Respiratory postive use of accessory muscles  decrease breath sounds    Gastrointestinal Normal   Lab Results: Routine Chem:  20-Oct-13 03:57    Result Comment CO2/LYTES - RESULTS VERIFIED BY REPEAT TESTING. CO2 - CRITICAL VALUE PREVIOUSLY NOTIFIED. ANION GAP - UNABLE TO CALCULATE ANION GAP DUE TO   - ABNORMALLY HIGH CO2.  Result(s) reported on 29 Apr 2012 at 04:37AM.   Glucose, Serum  135   BUN 15   Creatinine (comp) 0.94   Sodium, Serum  134   Potassium, Serum 4.0   Chloride, Serum  86   CO2, Serum  > 45   Calcium (Total), Serum 9.0   Anion Gap SEE COMMENT   Osmolality (calc) 271   eGFR (African American) >60   eGFR (Non-African American) >60 (eGFR values <71m/min/1.73 m2 may be an indication of chronic kidney disease (CKD). Calculated eGFR is useful in  patients with stable renal function. The eGFR calculation will not be reliable in acutely ill patients when serum creatinine is changing rapidly. It is not useful in  patients on dialysis. The eGFR calculation may not be applicable to patients at the low and high extremes of body sizes, pregnant women, and vegetarians.)  Routine Hem:  20-Oct-13 03:57    WBC (CBC)  2.9   RBC (CBC)  3.70   Hemoglobin (CBC)  8.9   Hematocrit (CBC)  28.2   Platelet Count (CBC) 211   MCV  76   MCH  24.0   MCHC  31.6   RDW  15.9   Neutrophil % 92.9   Lymphocyte % 6.2   Monocyte % 0.8   Eosinophil % 0.0   Basophil % 0.1   Neutrophil # 2.7   Lymphocyte #  0.2   Monocyte #  0.0   Eosinophil # 0.0   Basophil # 0.0 (Result(s) reported on 29 Apr 2012 at 04:37AM.)   Radiology Results: [place]    18-Oct-13 16:28, [place]   [place]    REASON FOR EXAM:    elevated ammonia,INR, evaluate liver.  COMMENTS:   Body Site: Right Upper Quad    PROCEDURE: [place] - [place] SURVE[place]  - Apr 27 2012  4:28PM     RESULT: Limited hepatic ultrasound  is performed. The patient has little   mobility and could not roll into different positions. Portal venous flow   appears normal. The hepatic echotexture is coarse. There is no ductal   dilation evident. A definite mass is not appreciated. The liver length is   13.77 cm. Portions of the left liver not well seen because of bowel gas.    IMPRESSION:   1. Increased echotexture the liver without ductal dilation or mass.   Limited exam as discussed above.    Dictation Site: 2          Verified By: Sundra Aland, M.D., MD   Assessment/Plan:  Assessment/Plan:   Assessment Mild ammonia elevation. On lactulose. Abnormal liver on u/S.    Plan Continue lactulose. Can moniter ammonia level periodically. Can order liver serologies to check for chronic liver disease. Dr. Vira Agar will check back tomorrow. Thanks.   Electronic  Signatures: Verdie Shire (MD)  (Signed 20-Oct-13 09:45)  Authored: Chief Complaint, VITAL SIGNS/ANCILLARY NOTES, Brief Assessment, Lab Results, Radiology Results, Assessment/Plan   Last Updated: 20-Oct-13 09:45 by Verdie Shire (MD)

## 2014-10-28 NOTE — Consult Note (Signed)
Chief Complaint:   Subjective/Chief Complaint hematuria after foley catheter removal   VITAL SIGNS/ANCILLARY NOTES: **Vital Signs.:   19-Oct-13 13:20   Vital Signs Type Blood Transfusion Complete   Temperature Temperature (F) 97.5   Celsius 36.3   Temperature Source axillary   Pulse Pulse 67   Pulse source if not from Vital Sign Device per cardiac monitor   Respirations Respirations 34   Systolic BP Systolic BP 283   Diastolic BP (mmHg) Diastolic BP (mmHg) 63   Mean BP 87   BP Source  if not from Vital Sign Device non-invasive   Pulse Ox % Pulse Ox % 94   Oxygen Delivery Non-invasive ventilation (CPAP/BIPAP)  *Intake and Output.:   Shift 19-Oct-13 15:00   Grand Totals Intake:  460 Output:  1350    Net:  -890 24 Hr.:  -890   Blood Product      In:  360   IV (Primary)      In:  100   Urine ml     Out:  1350   Length of Stay Totals Intake:  4997 Output:  11150    Net:  -6153   Brief Assessment:   Gastrointestinal details normal Soft  Nontender    Additional Physical Exam urine- in urinal is light yellow, no clots   Lab Results: Hepatic:  18-Oct-13 04:30    Bilirubin, Total 0.5   Alkaline Phosphatase 93   SGPT (ALT) 21   SGOT (AST) 20   Total Protein, Serum  6.1   Albumin, Serum  2.8  Routine BB:  19-Oct-13 09:01    ABO Group + Rh Type -   Antibody Screen -    10:18    Crossmatch Unit 1 Cancelled  Lab:  19-Oct-13 08:50    pH (ABG) 7.38   PCO2  90   PO2  57   Base Excess  22.6   HCO3  53.2   O2 Saturation 88.7   Specimen Site (ABG) RT RADIAL   Specimen Type (ABG) ARTERIAL   Patient Temp (ABG) 37.0   %FiO2 36.0   Lactic Acid, Cardiopulmonary 1.1 (Result(s) reported on 28 Apr 2012 at 08:58AM.)    13:40    pH (ABG) 7.40   PCO2  91   PO2  66   Base Excess  25.6   HCO3  56.4   O2 Saturation 95.4   Specimen Site (ABG) RT RADIAL   Specimen Type (ABG) ARTERIAL   Patient Temp (ABG) 37.0   PSV 10   PEEP 5.0   %FiO2 35.0  Routine Chem:  18-Oct-13  04:30    Result Comment co2 - CRITICAL VALUE PREVIOUSLY NOTIFIED. anion gap - UNABLE TO CALCULATE ANION GAP DUE TO   - ABNORMALLY HIGH CO2.  - mpg 04/27/12  Result(s) reported on 27 Apr 2012 at 04:51AM.   Ammonia, Plasma  57 (Result(s) reported on 27 Apr 2012 at 05:23AM.)   Glucose, Serum  110   BUN 17   Creatinine (comp) 1.28   Sodium, Serum 138   Potassium, Serum 4.4   Chloride, Serum  91   CO2, Serum  > 45   Calcium (Total), Serum 8.9   Anion Gap SEE COMMENT   Osmolality (calc) 278   eGFR (African American) >60   eGFR (Non-African American)  56 (eGFR values <11m/min/1.73 m2 may be an indication of chronic kidney disease (CKD). Calculated eGFR is useful in patients with stable renal function. The eGFR calculation will not be reliable in acutely  ill patients when serum creatinine is changing rapidly. It is not useful in  patients on dialysis. The eGFR calculation may not be applicable to patients at the low and high extremes of body sizes, pregnant women, and vegetarians.)  19-Oct-13 00:53    Result Comment CO2 - CRITICAL VALUE PREVIOUSLY NOTIFIED.  - RESULTS VERIFIED BY REPEAT TESTING. ANION GAP - UNABLE TO CALCULATE ANION GAP DUE TO   - ABNORMALLY HIGH CO2.  Result(s) reported on 28 Apr 2012 at 01:36AM.   Ammonia, Plasma  47 (Result(s) reported on 28 Apr 2012 at 02:05AM.)   Glucose, Serum  122   BUN 14   Creatinine (comp) 1.10   Sodium, Serum  135   Potassium, Serum 4.1   Chloride, Serum  86   CO2, Serum  > 45   Calcium (Total), Serum 8.7   Anion Gap SEE COMMENT   Osmolality (calc) 272   eGFR (African American) >60   eGFR (Non-African American) >60 (eGFR values <50m/min/1.73 m2 may be an indication of chronic kidney disease (CKD). Calculated eGFR is useful in patients with stable renal function. The eGFR calculation will not be reliable in acutely ill patients when serum creatinine is changing rapidly. It is not useful in  patients on dialysis. The eGFR  calculation may not be applicable to patients at the low and high extremes of body sizes, pregnant women, and vegetarians.)    08:50    Result Comment - READ-BACK PROCESS PERFORMED.  - DR. vAICKUTE [id number]/19/2013  Result(s) reported on 28 Apr 2012 at 08:58AM.    09:01    Result Comment TYPE AND SCREEN - CANCEL-DUPLICATE ORDER  Result(s) reported on 28 Apr 2012 at 09:43AM.    10:18    Result Comment 1xmatch - xmatched unit already on shelf  Result(s) reported on 28 Apr 2012 at 08:44AM.    13:40    Result Comment - READ-BACK PROCESS PERFORMED.  - dr. VEther [email or web address] 04/28/2012  Result(s) reported on 28 Apr 2012 at 01:45PM.  Routine Hem:  18-Oct-13 04:30    Hemoglobin (CBC)  8.2   WBC (CBC) 4.1   RBC (CBC)  3.54   Hematocrit (CBC)  27.3   Platelet Count (CBC) 188   MCV  77   MCH  23.0   MCHC  30.0   RDW  15.5   Neutrophil % 74.9   Lymphocyte % 9.2   Monocyte % 13.5   Eosinophil % 1.8   Basophil % 0.6   Neutrophil # 3.1   Lymphocyte #  0.4   Monocyte # 0.6   Eosinophil # 0.1   Basophil # 0.0 (Result(s) reported on 27 Apr 2012 at 04:51AM.)  19-Oct-13 00:53    Hemoglobin (CBC)  7.0   WBC (CBC) 4.0   RBC (CBC)  3.07   Hematocrit (CBC)  23.4   Platelet Count (CBC) 169   MCV  76   MCH  22.7   MCHC  29.8   RDW  15.9   Neutrophil % 74.0   Lymphocyte % 9.7   Monocyte % 13.8   Eosinophil % 1.7   Basophil % 0.8   Neutrophil # 3.0   Lymphocyte #  0.4   Monocyte # 0.6   Eosinophil # 0.1   Basophil # 0.0 (Result(s) reported on 28 Apr 2012 at 01:36AM.)    09:01    Hemoglobin (CBC)  7.7 (Result(s) reported on 28 Apr 2012 at 09:28AM.)   Assessment/Plan:  Assessment/Plan:   Assessment Hematuria resolved  Plan No further recs at this time May use condom cath if needed   Electronic Signatures: Margy Clarks (MD)  (Signed 19-Oct-13 14:36)  Authored: Chief Complaint, VITAL SIGNS/ANCILLARY NOTES, Brief Assessment, Lab Results, Assessment/Plan   Last Updated:  19-Oct-13 14:36 by Margy Clarks (MD)

## 2014-10-28 NOTE — Consult Note (Signed)
PATIENT NAME:  Christian [name], Christian [name] MR#:  [id number] DATE OF BIRTH:  Apr 26, 1941  DATE OF CONSULTATION:  04/30/2012  REFERRING PHYSICIAN:   CONSULTING PHYSICIAN:  Christian Mathisen K. Inette Doubrava, MD  The patient was seen in consultation in room #113 along with the patient's daughter who is a reliable historian.   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 The patient is divorced for 12 years after being married for 25 years. The patient lives by himself in an apartment in a senior citizen community. The patient was admitted to [facility] with a chief complaint "I have been having problems with shortness of breath and feeling tired and feeling dizzy and I need help as I had similar problems in February 2013" according to his daughter.   HISTORY OF PRESENT ILLNESS: Daughter reports that the patient has been feeling dizzy and has been restless and he was told that he has CO2 which is not at the right level and he came here for help.  PAST PSYCHIATRIC HISTORY: No previous history of inpatient hospitalizations on Psychiatry. No history of suicide attempts. Not being followed by any psychiatrist at this time.   ALCOHOL AND DRUGS: Daughter reports that when he was 72 or 8 years old he used to drink alcohol with his friends but not anymore since he was married. Denies street or prescription drug abuse.   MENTAL STATUS EXAMINATION: The patient is seen laying in bed. Alert and oriented to place, person, and time. Cognition is intact. Daughter thinks that he is depressed though he denies depression to the family. He feels lonely and he stated to his daughter and son many times that he wants his family to be back together which is his daughter, son, and his ex-wife who is now a good friend. Had problems in the marriage and ended up getting a divorce because they could not get along but currently his  ex-wife and him are very good friends and they relate well with each other and they help each other. No evidence of psychosis. Memory is intact. Cognition is intact. General knowledge and information is fair. Denies any ideas or plans to hurt himself or others but does admit that he needs help because of his loneliness and getting frustrated because of the same. Insight and judgment fair.   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   PLAN AND RECOMMENDATIONS: Discussed with daughter about starting him on antidepressant medication but daughter is not eager because she feels that he's on too many pills and is worried about the same. However, she reports that the patient was excited about going into senior citizen program where he can relate with other senior citizens and have common activities to do. Meanwhile, he was admitted to the hospital. Daughter reports that soon after his discharge from the hospital she plans to take him to this program where he will be involved with activities and he will be interacting with other people so that he will not be feeling lonely and he will not be left out and depressed. Counseling is recommended so that they can help him with his coping skills. If desirable, he and his ex-wife can go to counseling together where they can learn relationship and coping skills better.   ____________________________ Christian Cullens. Franchot Mimes, MD skc:drc  D: 04/30/2012 16:32:31 ET T: 04/30/2012 17:07:49 ET JOB#: [id number]  cc: Arlyn Leak K. Franchot Mimes, MD, <Dictator> Christian Penning MD ELECTRONICALLY SIGNED 05/01/2012 14:13

## 2014-10-28 NOTE — Consult Note (Signed)
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 I think that he needs iron replacement and this can be done either orally or with IV iron. He does give a history of irritable bowel like symptoms with alternating constipation and diarrhea mostly leaning toward constipation. Oral iron can be very constipating and this patient may not tolerate an oral iron and it may be better to give him IV iron sucrose in the hospital. I see no need for upper endoscopy at this time and will follow with you.   ____________________________ Manya Silvas, MD rte:drc D: 04/26/2012 20:53:01 ET T: 04/27/2012 06:09:54 ET JOB#: [id number]  cc: Manya Silvas, MD, <Dictator> Manya Silvas MD ELECTRONICALLY SIGNED 05/02/2012 8:06

## 2014-10-28 NOTE — H&P (Signed)
PATIENT NAME:  Christian [name], Christian [name] MR#:  [id number] DATE OF BIRTH:  May 25, 1941  DATE OF ADMISSION:  04/23/2012  PRIMARY CARE PHYSICIAN: Dr. [name]   CARDIOLOGIST: Dr. Ida Rogue    CHIEF COMPLAINT: Weakness and shortness of breath.   HISTORY OF PRESENT ILLNESS: This is a 74 year old male who presented to the Emergency Room due to generalized weakness and shortness of breath that began over the past few days. The patient has underlying COPD and also some congestive heart failure and uses BiPAP at night and has been using it but the shortness of breath has not improved. As per the family, the patient has not been eating and drinking well and also has had significant weakness where he is unable to get around at home. They were a bit concerned and brought him to the ER for further evaluation. He has gained a little bit of water weight in his lower extremities but he denied any paroxysmal nocturnal dyspnea or orthopnea. He denies any chest pain. He denies any cough, any productive sputum, any fevers, chills, any abdominal pain, nausea, vomiting, or any other associated symptoms presently. The patient here in the Emergency Room was noted to be hypoxic, was started on BiPAP and also was noted to be in acute renal failure. Hospitalist services were contacted for further treatment and evaluation.   REVIEW OF SYSTEMS: CONSTITUTIONAL: No documented fever. No weight gain, no weight loss. EYES: No blurry or double vision. ENT: No tinnitus, no postnasal drip, no redness of the oropharynx. RESPIRATORY: Positive cough. No wheeze, no hemoptysis. Positive dyspnea. CARDIOVASCULAR: No chest pain, no orthopnea, no palpitations, no syncope. GI: No nausea, no vomiting, no diarrhea, no abdominal pain, no melena, no hematochezia. GU: No dysuria, no hematuria. ENDOCRINE: No polyuria or nocturia. No heat or cold intolerance. HEME: No anemia, no bruising, no bleeding. INTEGUMENTARY: No rashes, no lesions. MUSCULOSKELETAL: No  arthritis, no swelling, no gout. NEUROLOGIC: No numbness, no tingling, no ataxia, no seizure-type activity. PSYCH: No anxiety, no insomnia, no ADD.   PAST MEDICAL HISTORY:  1. History of chronic atrial fibrillation. 2. Hypertension. 3. Benign prostatic hypertrophy. 4. Gastroesophageal reflux disease.  5. History of hypercapnic respiratory failure.  6. Chronic obstructive pulmonary disease.   ALLERGIES: Crestor, Pradaxa, and Tramadol.   SOCIAL HISTORY: Used to be a smoker, quit many years ago. Does have a 20-pack-year smoking history. No alcohol abuse. No illicit drug abuse. Lives at home by himself.   FAMILY HISTORY: Mother died from a brain aneurysm. Father died from CHF.   CURRENT MEDICATIONS:  1. Tylenol 500 mg q.6 hours as needed.  2. Amiodarone 200 mg daily.  3. Aspirin 81 mg daily. 4. Cetirizine 10 mg daily.  5. Digoxin 0.125 mg daily.  6. Multivitamin daily.  7. Sublingual nitroglycerin as needed.  8. Omeprazole 20 mg daily.  9. Potassium 10 mEq daily.  10. Xarelto 20 mg daily.  11. Senokot 8.6 mg daily.  12. Flomax 0.4 mg daily.  13. Torsemide 10 mg every other day.   PHYSICAL EXAMINATION ON ADMISSION:   VITAL SIGNS: Temperature 97.1, pulse 71, respirations 30, blood pressure 177/70, sats 99% on BiPAP.   GENERAL: He is a pleasant appearing male in mild respiratory distress.   HEENT: The patient is atraumatic, normocephalic. Extraocular muscles are intact. Pupils equal and reactive to light. Sclerae anicteric. No conjunctival injection. No pharyngeal erythema.   NECK: Supple. No jugular venous distention, no bruits, no lymphadenopathy, no thyromegaly.   HEART: Regular rate and rhythm.  He has a 2/6 systolic ejection murmur heard at the right sternal border. No rubs, no clicks.   LUNGS: He has some coarse bibasilar crackles, otherwise no dullness to percussion. Negative use of accessory muscles.   ABDOMEN: Soft, flat, nontender, nondistended. Has good bowel sounds.  No hepatosplenomegaly appreciated.   EXTREMITIES: No evidence of any cyanosis or clubbing. He does have +1 to 2 pitting edema from the knees to the ankles bilaterally. +2 pedal and radial pulses.   NEUROLOGIC: The patient is alert, awake, and oriented x3 with no focal motor or sensory deficits appreciated bilaterally.   SKIN: Moist and warm with no rash appreciated.   LYMPHATIC: There is no cervical or axillary adenopathy.  LABORATORY, DIAGNOSTIC, AND RADIOLOGICAL DATA: Serum glucose 88, BUN 22, creatinine 1.7, sodium 137, potassium 4.4, chloride 91, bicarb 40. LFTs are within normal limits. Troponin 0.04. Digoxin level 2.06. White cell count 4.7, hemoglobin 10.2, hematocrit 32.3, platelet count 238. ABG showed a pH of 7.43, pCO2 68, pO2 54.   Chest x-ray done on admission showing bilateral diffuse interstitial thickening likely representing interstitial edema versus interstitial pneumonitis.   ASSESSMENT AND PLAN: This is a 74 year old male with past medical history of chronic atrial fibrillation, gastroesophageal reflux disease, benign prostatic hypertrophy, history of congestive heart failure, history of coronary disease status post coronary artery bypass graft, and history of DVT who came into the hospital due to generalized weakness, lethargy, and also shortness of breath and noted to be in acute on chronic respiratory failure and also in acute renal failure.  1. Acute on chronic respiratory failure. The exact etiology is unclear. Questionable if there is underlying COPD with some mild CHF. As per the patient and the family, the patient has no history of COPD but he does have an elevated bicarb on chemistry and also CO2 retention and does have a 20-pack-year smoking history. He likely has some obstructive airway lung disease. He used to be on Symbicort but it was discontinued by his pulmonologist. He does use BiPAP at bedtime which I will continue as it has improved his symptoms. He did get some  Lasix in the Emergency Room. Since his renal function is a bit up and I think he's a little bit dehydrated, I will not start any further diuretics at this point. Will follow him clinically. Will try to wean him off the BiPAP by tomorrow. Repeat a repeat blood gas in the morning.  2. Acute renal failure. Again, the etiology is unclear but likely related to dehydration from poor p.o. intake or possibly due to underlying CHF. For now will give him gentle IV fluids, hold his diuretics, and follow his BUN, creatinine, and urine output. Renal dose his meds. Avoid nephrotoxins.  3. Chronic atrial fibrillation, currently rate controlled. Continue amiodarone. Continue Xarelto.  4. Gastroesophageal reflux disease. Continue Prilosec.  5. Benign prostatic hypertrophy. Continue Flomax.  6. Generalized weakness and lethargy. The exact etiology is unclear. Questionable if this is related to deconditioning versus COPD or CHF. His neurological exam is essentially benign. I will get a physical therapy consult to assess his mobility. He may benefit from home health services. The patient had a similar episode like this a while back in February where he had generalized weakness and it was thought to be attributed to hypercarbia at this point but he has no previous history of obstructive lung disease, therefore, there was some concern that his weakness and his air trapping could be related to a neurological problem, possibly ALS,  although he was seen by Neurology and they did not think that the patient had any evidence of an acute neurologic process. If his weakness continues to persist, would consider getting a Neurology evaluation during this hospitalization.   CODE STATUS: The patient is a FULL CODE.   TIME SPENT WITH THE ADMISSION: 60 minutes.   ____________________________ Belia Heman. Verdell Carmine, MD vjs:drc D: 04/23/2012 21:01:50 ET T: 04/24/2012 06:00:31 ET JOB#: [id number]  cc: Belia Heman. Verdell Carmine, MD, <Dictator> Elveria Rising.  Damita Dunnings, MD Henreitta Leber MD ELECTRONICALLY SIGNED 04/25/2012 10:16

## 2014-10-28 NOTE — Consult Note (Signed)
Brief Consult Note: Diagnosis: Gross hematuria.   Patient was seen by consultant.   Consult note dictated.   Recommend further assessment or treatment.   Orders entered.   Discussed with Attending MD.   Comments: Keep patient well hydrated and use diuretics to promote good UOP. Patient is not in retention at this time, and I would prefer to avoid placing a Foley again unless retention occurs. Hematuria is most likely due to foley trauma in combination with Xeralto and should clear without surgical intervention.  Electronic Signatures: Royston Cowper (MD)  (Signed 18-Oct-13 14:42)  Authored: Brief Consult Note   Last Updated: 18-Oct-13 14:42 by Royston Cowper (MD)

## 2014-10-31 ENCOUNTER — Encounter: Payer: Self-pay | Admitting: Family Medicine

## 2014-10-31 ENCOUNTER — Ambulatory Visit: Payer: Medicare HMO | Admitting: Family Medicine

## 2014-10-31 ENCOUNTER — Ambulatory Visit (INDEPENDENT_AMBULATORY_CARE_PROVIDER_SITE_OTHER): Payer: Medicare HMO | Admitting: Family Medicine

## 2014-10-31 VITALS — BP 118/78 | HR 76 | Temp 97.8°F | Wt 194.5 lb

## 2014-10-31 DIAGNOSIS — I4891 Unspecified atrial fibrillation: Secondary | ICD-10-CM | POA: Diagnosis not present

## 2014-10-31 DIAGNOSIS — L570 Actinic keratosis: Secondary | ICD-10-CM

## 2014-10-31 DIAGNOSIS — R0602 Shortness of breath: Secondary | ICD-10-CM | POA: Diagnosis not present

## 2014-10-31 DIAGNOSIS — R739 Hyperglycemia, unspecified: Secondary | ICD-10-CM

## 2014-10-31 DIAGNOSIS — R5383 Other fatigue: Secondary | ICD-10-CM

## 2014-10-31 DIAGNOSIS — I5032 Chronic diastolic (congestive) heart failure: Secondary | ICD-10-CM

## 2014-10-31 NOTE — H&P (Signed)
DATE OF BIRTH:  1940/09/04  DATE OF ADMISSION:  09/25/2012  PRIMARY CARE PHYSICIAN:  Dr. Damita Dunnings  REFERRING PHYSICIAN:  Dr. Jimmye Norman CHIEF COMPLAINT:  Shortness of breath, cough, sputum for the past one week.   HISTORY OF PRESENT ILLNESS:  The patient is a 74 year old Caucasian male with a history of CHF, CAD, hypertension, BPH, presented to the ED with the above chief complaint. The patient is alert, awake, oriented, in no acute distress. According to the patient and his son and daughter, patient has had a cough with sputum, shortness of breath for the past one week, and worsening leg swelling. In addition, the patient's general condition has been declining. He is unable to walk, has poor oral intake. He was fine until last week. The patient denies any fever or chills. No headache, but has dizziness sometimes. No chest pain, palpitations. No nocturnal dyspnea, but using BiPAP and night for sleep apnea. The patient has a history of AFib, but he is only on aspirin due to recommendation by Dr. Rockey Situ. The patient's chest x-ray shows CHF findings.   PAST MEDICAL HISTORY:  Hypertension, chronic AFib, CHF, diastolic dysfunction, ejection fraction more than 55%, BPH, GERD, COPD, OSA, on BiPAP at home.   SOCIAL HISTORY:  No smoking, drinking or illicit drugs. Actually, the patient quit smoking many years ago.   FAMILY HISTORY:  Mother died of brain aneurysm. Father died of CHF.  PAST SURGICAL HISTORY:  CABG.   REVIEW OF SYSTEMS: CONSTITUTIONAL:  The patient denies any fever or chills. No headache or dizziness, but has generalized weakness, unable to walk.  EYES:  No double vision or blurred vision.  EARS, NOSE, THROAT:  No epistaxis, postnasal drip or slurred speech.  CARDIOVASCULAR:  No chest pain, palpitations. Questionable orthopnea. No nocturnal dyspnea, but has bilateral leg edema.  GASTROINTESTINAL: No abdominal pain, nausea, vomiting or diarrhea. No melena or bloody stool.  PULMONARY:   Positive for cough, sputum, shortness of breath. No wheezing or hemoptysis.  EXTREMITIES:  Positive for edema, but no joint pain.  HEMATOLOGY:  No easy bruising or bleeding.  SKIN:  No rash or jaundice.  ENDOCRINOLOGY:  No polyuria, polydipsia, heat or cold intolerance.  NEUROLOGY:  No syncope, loss of consciousness or seizure.   ALLERGIES:  CRESTOR, PRADAXA, TRAMADOL.    HOME MEDICATIONS: 1.  Torsemide 20 mg p.o. 0.5 to 1 tablet once a day. 2.  Flomax 0.4 mg p.o. daily. 3.  Spironolactone 25 mg 0.5 to 1 tablet once a day p.r.n. for high blood pressure and swelling.  4.  Potassium 10 mEq 1 tablet once a day p.r.n.  5.  Polyethylene glycol 3350 17 grams once a day.  6.  Omeprazole 20 mg p.o. daily.  7.  Nitrostat 0.4 mg 1 tab subcu every 5 minutes for chest pain p.r.n.  8.  Multaq 400 mg p.o. b.i.d.  9.  Lopressor 25 mg p.o. b.i.d.  10.  Ferrous gluconate 325 mg p.o. daily.  11.  Acetaminophen 500 mg p.o. every 6 hours p.r.n. for pain.   VITAL SIGNS: Temperature 97.9, blood pressure 120/66, pulse 88, respirations 16, O2 saturation 100% on room air.   PHYSICAL EXAMINATION:  GENERAL: The patient is alert, awake, oriented, in no acute distress.  HEENT: Pupils round, equal, reactive to light and accommodation. Moist oral mucosa. Clear pharynx.  NECK:  Supple. No JVD or carotid bruit. No lymphadenopathy. No thyromegaly.  CARDIOVASCULAR:  S1 and S2. Irregular rate and rhythm. No murmurs or gallops.  PULMONARY:  Bilateral air entry. Bilateral basilar rales. No crackles or wheezing. No use of accessory muscles to breathe.  ABDOMEN:  Soft. No distention or tenderness. No organomegaly. Bowel sounds present.  EXTREMITIES:  Bilateral leg edema, 3+. No tenderness, clubbing or cyanosis. Difficult to estimate whether patient has pedal pulses due to swelling.  SKIN:  No rash or jaundice.  NEUROLOGIC: Alert and oriented x 3. No focal deficits. Power 2 out of 5 bilateral legs. Sensation intact. DTR 2+.    LABORATORY DATA:  BNP 5718. WBC 4.9, hemoglobin 12.2, platelets 198. Glucose 72, BUN 22, creatinine 1.28, sodium 134, potassium 4.3, bicarb 38, chloride 92, lipase 120. Troponin less than 0.02. Chest x-ray showed findings consistent with CHF. EKG shows AFib at 98 BPM.   IMPRESSIONS: 1.  Congestive heart failure, acute on chronic, with diastolic dysfunction.  2.  Atrial fibrillation.  Rate is controlled.  3.  Chronic obstructive pulmonary disease.  4.  Coronary artery disease.  5.  Hypertension.  6.  Obstructive sleep apnea.   PLAN OF TREATMENT:   1.  The patient will be admitted to telemetry floor. We will continue O2 by nasal cannula. Start Lasix 30 mg IV b.i.d. Hold torsemide and spironolactone, and start CHF  protocol. Follow up with [facility].   2.  For atrial fibrillation, we will continue aspirin, Multaq, Lopressor, and check lipid panel.   3.  Gastrointestinal and deep vein thrombosis prophylaxis.   Discussed the patient's condition and plan of treatment with the patient's son, daughter and patient.    Patient is a FULL CODE.   TIME SPENT:  About 58 minutes.    ____________________________ Demetrios Loll, MD qc:mr D: 09/25/2012 15:36:00 ET T: 09/25/2012 19:43:44 ET JOB#: [id number]  cc: Demetrios Loll, MD, <Dictator> Demetrios Loll MD ELECTRONICALLY SIGNED 09/29/2012 18:09

## 2014-10-31 NOTE — Consult Note (Signed)
General Aspect 74 -year-old gentleman with a history of coronary artery disease, CABG x3 at [facility] in 2009, diabetes, history of DVT on the left with chronic lower extremity edema, catheterization in March 2009 showing patent grafts with elevated pulmonary pressures, chronic atrial fibrillation on warfarin, prior admissions for mental status changes, hypercapnea, SOB, diastolic CHF, (0/04/2724), he required BiPAP. It was initially felt his respiratory issues were secondary to sedation at the time of his EGD though he required respiratory support for several days.  Previously evaluated by Dr. Lake Bells in pulmonary clinic and was felt not to have significant COPD. Extensive neuro workup on last admission with negative findings, high amonia level started on lactulose, Now presenting with increased dyspnea and lower extremity edema, mental status changes. Cardiology was consulted for diastolic CHF.  Per the family, patient has had a cough with sputum, shortness of breath for the past one week, and worsening leg swelling. In addition, the patient???s general condition has been declining. He is unable to walk, has poor oral intake.   Prior Echocardiogram showed normal LV systolic function greater than 55%, borderline elevated right ventricular systolic pressures   Present Illness . SOCIAL HISTORY:  No smoking, drinking or illicit drugs. Actually, the patient quit smoking many years ago.   FAMILY HISTORY:  Mother died of brain aneurysm. Father died of CHF.  PAST SURGICAL HISTORY:  CABG.   Physical Exam:  GEN well developed, well nourished, no acute distress   HEENT PERRL, moist oral mucosa   NECK supple  No masses   RESP normal resp effort  crackles   CARD Irregular rate and rhythm  Tachycardic   ABD denies tenderness  no hernia   LYMPH negative neck   EXTR positive edema, 1+ pitting LE edema   SKIN normal to palpation   NEURO motor/sensory function intact   PSYCH lethargic, arousable    Review of Systems:  Subjective/Chief Complaint SOB, confusion, edema   General: Fatigue  Weakness   Skin: No Complaints   ENT: No Complaints   Eyes: No Complaints   Neck: No Complaints   Respiratory: Short of breath   Cardiovascular: Dyspnea  Edema   Gastrointestinal: No Complaints   Genitourinary: No Complaints   Vascular: No Complaints   Musculoskeletal: No Complaints   Neurologic: No Complaints   Hematologic: No Complaints   Endocrine: No Complaints   Psychiatric: No Complaints   Review of Systems: All other systems were reviewed and found to be negative   Medications/Allergies Reviewed Medications/Allergies reviewed     Anemia:    CABG:    Diabetes:    Atrial Fibrillation:    hypertension:    cabg: 2001  Home Medications: Medication Instructions Status  acetaminophen 500 mg oral tablet 1 tab(s) orally every 6 hours, As Needed- for Pain  Active  metoprolol tartrate 25 mg oral tablet 1 tab(s) orally 2 times a day Active  torsemide 20 mg oral tablet 0.5-1 tab(s) orally once a day, As Needed for swelling Active  ferrous gluconate 325 mg oral tablet 1 tab(s) orally once a day with breakfast Active  Multaq 400 mg oral tablet 1 tab(s) orally 2 times a day (with meals) Active  Nitrostat 0.4 mg sublingual tablet 1 tab(s) sublingual every 5 minutes, As Needed- for Chest Pain  Active  polyethylene glycol 3350 - oral powder for reconstitution 17 gram(s) orally once a day Active  potassium chloride 10 mEq oral tablet, extended release 1 tab(s) orally once a day, As Needed for swelling  Active  spironolactone 25 mg oral tablet 0.5-1 tab(s) orally once a day, As Needed for swelling/high blood pressure Active  omeprazole 20 mg oral delayed release capsule 1 cap(s) orally once a day Active  tamsulosin 0.4 mg oral capsule 1 cap(s) orally once a day after supper Active   Lab Results:  Routine Chem:  19-Mar-14 04:19   Glucose, Serum 80  BUN  21  Creatinine  (comp)  1.32  Sodium, Serum  135  Potassium, Serum 3.9  Chloride, Serum  91  CO2, Serum  43  Calcium (Total), Serum 8.7  Anion Gap  1  Osmolality (calc) 272  eGFR (African American) >60  eGFR (Non-African American)  54 (eGFR values <30m/min/1.73 m2 may be an indication of chronic kidney disease (CKD). Calculated eGFR is useful in patients with stable renal function. The eGFR calculation will not be reliable in acutely ill patients when serum creatinine is changing rapidly. It is not useful in  patients on dialysis. The eGFR calculation may not be applicable to patients at the low and high extremes of body sizes, pregnant women, and vegetarians.)  Result Comment CO2 - RESULTS VERIFIED BY REPEAT TESTING.  - READ-BACK PROCESS PERFORMED.  - C/MARCELLE TURNER 09-26-12 @ 0525 SJL  Result(s) reported on 26 Sep 2012 at 05:23AM.  Magnesium, Serum  1.5 (1.8-2.4 THERAPEUTIC RANGE: 4-7 mg/dL TOXIC: > 10 mg/dL  -----------------------)  Cholesterol, Serum 144  Triglycerides, Serum 58  HDL (INHOUSE) 51  VLDL Cholesterol Calculated 12  LDL Cholesterol Calculated 81 (Result(s) reported on 26 Sep 2012 at 05:23AM.)   EKG:  Interpretation EKG shows atrial fib with rate 98 bpm   Radiology Results: XRay:    18-Mar-14 13:35, Chest Portable Single View  Chest Portable Single View   REASON FOR EXAM:    Shortness of Breath  COMMENTS:       PROCEDURE: DXR - DXR PORTABLE CHEST SINGLE VIEW  - Sep 25 2012  1:35PM     RESULT: Comparison is made to the study of April 29, 2012.    The lungs are mildly hypoinflated. The interstitial markings are   increased bilaterally. The cardiac silhouette is enlarged. The pulmonary   vascularity is engorged. The patient has undergone previous CABG.    IMPRESSION:  The findings are consistent with CHF. A followup PA and   lateral chest x-ray would be of value when the patient can tolerate the   procedure.   Dictation Site: 2        Verified By: DAVID A.  JMartinique M.D., MD    Pradaxa: Angioedema  Tramadol: Other  Crestor: Unknown  Vital Signs/Nurse's Notes: **Vital Signs.:   19-Mar-14 08:57  Vital Signs Type Pre Medication  Temperature Temperature (F) 98  Celsius 36.6  Temperature Source oral  Pulse Pulse 97  Respirations Respirations 20  Systolic BP Systolic BP 1563 Diastolic BP (mmHg) Diastolic BP (mmHg) 67  Mean BP 84  Pulse Ox % Pulse Ox % 93  Pulse Ox Activity Level  At rest  Oxygen Delivery 2L    Impression 89-year-old gentleman with a [facility], history of DVT on the left with chronic lower extremity edema, catheterization in March 2009 showing patent grafts with elevated pulmonary pressures, chronic atrial fibrillation on warfarin, prior admissions for mental status changes, hypercapnea, SOB, diastolic CHF, required BiPAP, presenting again with diastolic CHF, SOB/edema, mental status changes.  1) Mental status changes: suspect combination of diastolic CHF,  noty wearing bipap recently, poor diet In the past admissions has had hypercapnea (on ABG) Stressed the importance of wearingh bipap with naps and at night sleeping. He does well when living with family, recently has been living by himself again. --Would continue diuresis, bipap at night and with naps --PT/OT --Will check amonia level. If elevated, could restart lactulose as used on previous admissions  2) Diastolic CHF recent poor diet when power out, not using bipap when the power went out Agree with gentle diuresis.   3) Atrial fibrillartion: Rate elevated this AM, will restart outpat meds. on multaq BID, metoprolol (increase to 25 mg Q8 hours) Avoiding Ca channels blockers as this makes LE edema worse  4) CAD/CABG: continue outpt meds, no active ischemia   Electronic Signatures: Ida Rogue (MD)  (Signed 19-Mar-14 09:29)  Authored: General Aspect/Present Illness, History and Physical Exam,  Review of System, Past Medical History, Home Medications, Labs, EKG , Radiology, Allergies, Vital Signs/Nurse's Notes, Impression/Plan   Last Updated: 19-Mar-14 09:29 by Ida Rogue (MD)

## 2014-10-31 NOTE — Discharge Summary (Signed)
PATIENT NAME:  Christian [name], Christian [name] MR#:  [id number] DATE OF BIRTH:  1941-02-22  DATE OF ADMISSION:  09/25/2012 DATE OF DISCHARGE:  10/09/2012  ADDENDUM:  The patient did not get discharged yesterday as ordered because he had difficulty filling his prescriptions. I had discussed prior authorization issues with his pharmacy and with his insurance company for greater than 20 minutes and had obtained authorization for both; however, there is an issue with filling the prescriptions for rifaximin and  Provigil. Therefore, he did not get discharged and the discharge was placed on hold. Apparently the issues have been resolved and he is okay to be discharged.   For final diagnoses and discharge medications, please see the dictation yesterday by me. The final diagnoses and medications have not changed.  CODE STATUS: The patient is LIMITED CODE.  TOTAL TIME SPENT: 35 minutes.  ____________________________ Vivien Presto, MD sa:sb D: 10/09/2012 12:22:47 ET T: 10/09/2012 12:42:06 ET JOB#: [id number]  cc: Vivien Presto, MD, <Dictator> Vivien Presto MD ELECTRONICALLY SIGNED 10/10/2012 16:45

## 2014-10-31 NOTE — Consult Note (Signed)
Referring Physician:  Demetrios Loll :   Primary Care Physician:  Demetrios Loll : [facility], [address] [place], [place] [address], [place] [phone]  Reason for Consult: Admit Date: 30-Sep-2012  Chief Complaint: altered mental status  Reason for Consult: altered mental status   History of Present Illness: History of Present Illness:   74 yo RHD M presents with altered mental status.  It was noted that he had increased ammonia and CO2 which would account for his confusion.  Pulmonary raised the issue of possible neurologic cause of his dyspnea.  He reports dysphagia as well.  No diplopia described but history limited by pts somnolence.  ROS:  Review of Systems   difficult to obtain secondary to pts somnolence  Past Medical/Surgical Hx:  Anemia:   CABG:   Diabetes:   Atrial Fibrillation:   hypertension:   cabg:   Past Medical/ Surgical Hx:  Past Medical History as above   Past Surgical History as above   Home Medications: Medication Instructions Last Modified Date/Time  acetaminophen 500 mg oral tablet 1 tab(s) orally every 6 hours, As Needed- for Pain  18-Mar-14 13:33  metoprolol tartrate 25 mg oral tablet 1 tab(s) orally 2 times a day 18-Mar-14 13:33  torsemide 20 mg oral tablet 0.5-1 tab(s) orally once a day, As Needed for swelling 18-Mar-14 13:33  ferrous gluconate 325 mg oral tablet 1 tab(s) orally once a day with breakfast 18-Mar-14 13:33  Multaq 400 mg oral tablet 1 tab(s) orally 2 times a day (with meals) 18-Mar-14 13:33  Nitrostat 0.4 mg sublingual tablet 1 tab(s) sublingual every 5 minutes, As Needed- for Chest Pain  18-Mar-14 13:33  polyethylene glycol 3350 - oral powder for reconstitution 17 gram(s) orally once a day 18-Mar-14 13:33  potassium chloride 10 mEq oral tablet, extended release 1 tab(s) orally once a day, As Needed for swelling 18-Mar-14 13:33  spironolactone 25 mg oral tablet 0.5-1 tab(s) orally once a day, As Needed for swelling/high blood  pressure 18-Mar-14 13:33  omeprazole 20 mg oral delayed release capsule 1 cap(s) orally once a day 18-Mar-14 13:33  tamsulosin 0.4 mg oral capsule 1 cap(s) orally once a day after supper 18-Mar-14 13:33   Allergies:  Pradaxa: Angioedema  Tramadol: Other  Crestor: Unknown  Allergies:  Allergies as above   Social/Family History: Employment Status: disabled  Lives With: spouse  Living Arrangements: house  Social History: no tob, no EtOH, no illicits  Family History: B died of cardiac issues, F died at 37 due to cardiac issues   Vital Signs: **Vital Signs.:   26-Mar-14 09:54  Vital Signs Type Routine  Temperature Temperature (F) 97.5  Celsius 36.3  Pulse Pulse 101  Respirations Respirations 20  Systolic BP Systolic BP 779  Diastolic BP (mmHg) Diastolic BP (mmHg) 74  Mean BP 88  Pulse Ox % Pulse Ox % 92  Pulse Ox Activity Level  At rest  Oxygen Delivery 1L   Physical Exam: General: ill appearing, thin, poorly kept  HEENT: normocephalic, sclera nonicteric, oropharynx clear  Neck: supple, no JVD, no bruits  Chest: CTA B, poor movement, mild wheezing  Cardiac: tachycardic with 2/6 murmur, 2+ pulses  Extremities: 1+ edema, no C/C   Neurologic Exam: Mental Status: lethargic and oriented to person only, follows simple commands, moderate dysarthria  Cranial Nerves: PERRLA, EOMI, nl VF, face symmetric but B ptosis, tongue midline, shoulder shrug equal  Motor Exam: 4/5 B, atrophy in chest, back and hands, + myotonia, decreased tone  Deep  Tendon Reflexes: 1+/4 B, downgoing plantars  Sensory Exam: impossible to test due to confusion  Coordination: untestable   Lab Results: Hepatic:  23-Mar-14 04:18   Bilirubin, Total 0.7  Alkaline Phosphatase 110  SGPT (ALT) 21  SGOT (AST) 20  Total Protein, Serum 6.4  Albumin, Serum  3.2  Routine Chem:  18-Mar-14 13:42   Lipase 120 (Result(s) reported on 25 Sep 2012 at 02:22PM.)  B-Type Natriuretic Peptide [facility])  5718 (Result(s)  reported on 25 Sep 2012 at 02:30PM.)  19-Mar-14 04:19   Cholesterol, Serum 144  Triglycerides, Serum 58  HDL (INHOUSE) 51  VLDL Cholesterol Calculated 12  LDL Cholesterol Calculated 81 (Result(s) reported on 26 Sep 2012 at 05:23AM.)  24-Mar-14 06:01   Ammonia, Plasma  67 (Result(s) reported on 01 Oct 2012 at 07:19AM.)  26-Mar-14 04:00   Magnesium, Serum 1.8 (1.8-2.4 THERAPEUTIC RANGE: 4-7 mg/dL TOXIC: > 10 mg/dL  -----------------------)  Glucose, Serum  112  BUN  23  Creatinine (comp) 1.22  Sodium, Serum  132  Potassium, Serum 4.5  Chloride, Serum  93  CO2, Serum  40  Calcium (Total), Serum 8.6  Anion Gap SEE COMMENT  Osmolality (calc) 269  eGFR (African American) >60  eGFR (Non-African American)  59 (eGFR values <2m/min/1.73 m2 may be an indication of chronic kidney disease (CKD). Calculated eGFR is useful in patients with stable renal function. The eGFR calculation will not be reliable in acutely ill patients when serum creatinine is changing rapidly. It is not useful in  patients on dialysis. The eGFR calculation may not be applicable to patients at the low and high extremes of body sizes, pregnant women, and vegetarians.)    09:00   Result Comment - NOTIFIED OF CRITICAL VALUE  - READ-BACK PROCESS PERFORMED.  - Dr.Konidena at [facility] 10/03/12  - by gkm  Result(s) reported on 03 Oct 2012 at 09:07AM.  Cardiac:  18-Mar-14 13:42   Troponin I < 0.02 (0.00-0.05 0.05 ng/mL or less: NEGATIVE  Repeat testing in 3-6 hrs  if clinically indicated. >0.05 ng/mL: POTENTIAL  MYOCARDIAL INJURY. Repeat  testing in 3-6 hrs if  clinically indicated. NOTE: An increase or decrease  of 30% or more on serial  testing suggests a  clinically important change)  Routine UA:  18-Mar-14 13:42   Color (UA) YELLOW  Clarity (UA) CLEAR  Glucose (UA) NEGATIVE  Bilirubin (UA) NEGATIVE  Ketones (UA) NEGATIVE  Specific Gravity (UA) 1.010  Blood (UA) NEGATIVE  pH (UA) 6.0  Protein (UA)  NEGATIVE  Nitrite (UA) NEGATIVE  Leukocyte Esterase (UA) NEGATIVE (Result(s) reported on 25 Sep 2012 at 04:09PM.)  RBC (UA) NONE SEEN  WBC (UA) NONE SEEN  Bacteria (UA) NEGATIVE  Epithelial Cells (UA) 0-5 / HPF  Result(s) reported on 25 Sep 2012 at 04:09PM.  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  doubt myasthenia gravis due to family hx but still possibiility.  this could definitely worsen pulmonary status and cause conduction abnormalities check TSH and acetylcholine ab needs EMG/NCS as outpatient recommend genetic testing and counseling as outpatient start provigil 176m qAM recommend BiPAP throughout the day continue cardiac and pulmonary support will follow  Electronic Signatures: SJamison Neighbor(MD)  (Signed 26-Mar-14 13:03)  Authored: REFERRING PHYSICIAN, Primary Care Physician, Consult, History of Present Illness, Review of Systems, PAST MEDICAL/SURGICAL HISTORY, HOME MEDICATIONS, ALLERGIES, Social/Family History, NURSING VITAL SIGNS, Physical Exam-, LAB RESULTS, Recommendations   Last Updated: 26-Mar-14 13:03 by SJamison Neighbor(MD)

## 2014-10-31 NOTE — Patient Instructions (Signed)
Go to the lab on the way out.  We'll contact you with your lab report. The skin spots look like actinic keratoses and we can treat them if the get more irritated.  They still look mild now.  Wear a heat and avoid sunburns.   I'll await your cardiology appointment and consult notes.   Take care.  Glad to see you.

## 2014-10-31 NOTE — Progress Notes (Signed)
Pre visit review using our clinic review tool, if applicable. No additional management support is needed unless otherwise documented below in the visit note.  He goes to an adult center daily.  Lunch is there.  Still living by himself.  Cereal for breakfast and he does sandwiches on his own, with family helping out some with dinners.    Weight usually ~188, was this AM before getting dressed and he hasn't taken his fluid pill yet today.   No FCNAVD.   Moving bowels well.  No confusion, and off chronulac.  He still has some fatigue and occ needs to nap during the afternoon.  Still using his positive pressure equipment with sleeping.   On low salt diet.  Rarely SOB, usually when his weight is up.    He had f/u with Ma Hillock in the meantime with recent iron infusions.  He had f/u schedule for 01/2015 with Dr. Ma Hillock.    Noted dry flaky scalp.  Doesn't itchy usually, occ will itchy.  Going on for "a long time."  Prev used head and shoulders, then changed again w/o help.    Meds, vitals, and allergies reviewed.   ROS: See HPI.  Otherwise, noncontributory.  GEN: nad, alert and oriented HEENT: mucous membranes moist NECK: supple w/o LA CV: IRR  no murmur PULM: ctab, no inc wob ABD: soft, +bs EXT: 2+ edema SKIN: no acute rash but mild small AKs noted on scalp.

## 2014-10-31 NOTE — Discharge Summary (Signed)
PATIENT NAME:  Christian [name], Christian [name] MR#:  [id number] DATE OF BIRTH:  Dec 13, 1940  DATE OF ADMISSION:  09/25/2012 DATE OF DISCHARGE:  10/08/2012  For H and P and previous hospitalization, please see the dictation on admission from September 25, 2012 and interim discharge summary dictated on October 05, 2012 by Dr. Ether Griffins. This summary should cover from October 06, 2012 to October 08, 2012.   FINAL DIAGNOSES:   1.  Acute on chronic diastolic congestive heart failure.  2.  Acute on chronic respiratory failure secondary to hypercapnia.  3.  Pneumonia, likely aspiration.  4.  Altered mental status likely hepatic encephalopathy and hypercapnia.  5.  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  6.  Hypernatremia due to diuretics.  7.  Atrial fibrillation.  8.  Obstructive sleep apnea requiring BiPAP.  9.  Coronary artery disease.  10.  Hypertension.  11.  Nausea and vomiting, resolved.  12.  Dysphagia.  13.  Hypophosphatemia.  14.  Acute renal failure.   DISCHARGE MEDICATIONS:  Omeprazole 20 mg daily, tamsulosin 0.4 mg daily, torsemide 20 mg 0.5 to 1 tab once a day as needed for swelling, ferrous gluconate 325 mg 1 tab once a day, Multaq 400 mg 1 tab 2 times a day, Nitrostat sublingual every 5 minutes 0.4 mg as needed for chest pain, MiraLax 3350 oral powder 17 grams once a day, Xarelto 20 mg daily, metoprolol tartrate 25 mg every 8 hours, modafinil 200 mg once a day in the morning, rifaximin 550 mg 2 times a day.   The patient will be going home with home health, PT and R.N. and 2 liters continuous nasal cannula oxygen.   DIET:  Low sodium, mechanical soft, thin liquids with aspiration precautions, monitor meals, moistened meats.   ACTIVITY:  As tolerated.   FOLLOWUP:  Please follow with PCP and Dr. Rockey Situ, your cardiologist, within 1 to 2 weeks. Please follow with neurology within 2 to 4 weeks for EMG and nerve conduction studies, Dr. Manuella Ghazi.   DISPOSITION:  Home with home health.   SIGNIFICANT LABS IN THE LAST FEW  DAYS:  Phosphorus was 1 on March 29th, magnesium was 1.7 and sodium was 133. Ammonia was 88 on March 26th, was 1 on March 30th and 46 on March 31st.   HOSPITAL COURSE:  The patient has done reasonably well in the last several days. He has improved significantly in terms of mentation with rifaximin. His ammonia level has come down, currently is in the mid-40s and he will be discharged with that. He is unable to tolerate lactulose well and does not like to take it given significant bloating issues with it. In regards to his weakness, it has improved with the medication modafinil as well as PT. His phosphorus was low and that has been repleted. He has been on BiPAP at night. His blood pressures have been stable and atrial fibrillation has been well controlled. At this point, he will be discharged with home health, PT and R.N. He will be staying with his sisters. There are multiple family conversations and the patient's kids are on board with the fact that he is unable to care for himself at this point and after more recovery with living with sisters still likely make other arrangements for someone to be close to him. He has an appointment set up with Dr. Manuella Ghazi for nerve conduction studies and further testing such as EMG for myotonic dystrophy as well as an appointment with Dr. Rockey Situ in regards to his CHF. Given his renal  failure, he will not be discharged with daily diuretics, but instead, it will be on an as-needed basis.   CODE STATUS:  THE PATIENT IS LIMITED CODE.  TOTAL TIME SPENT:  35 minutes.    ____________________________ Vivien Presto, MD sa:si D: 10/08/2012 16:02:00 ET T: 10/08/2012 22:13:34 ET JOB#: [id number]  cc: Vivien Presto, MD, <Dictator> Vivien Presto MD ELECTRONICALLY SIGNED 10/10/2012 16:45

## 2014-11-01 LAB — COMPREHENSIVE METABOLIC PANEL
ALT: 11 U/L (ref 0–53)
AST: 19 U/L (ref 0–37)
Albumin: 3.3 g/dL — ABNORMAL LOW (ref 3.5–5.2)
Alkaline Phosphatase: 89 U/L (ref 39–117)
BILIRUBIN TOTAL: 0.5 mg/dL (ref 0.2–1.2)
BUN: 20 mg/dL (ref 6–23)
CO2: 33 meq/L — AB (ref 19–32)
CREATININE: 1.09 mg/dL (ref 0.50–1.35)
Calcium: 9.2 mg/dL (ref 8.4–10.5)
Chloride: 93 mEq/L — ABNORMAL LOW (ref 96–112)
GLUCOSE: 83 mg/dL (ref 70–99)
Potassium: 4.7 mEq/L (ref 3.5–5.3)
Sodium: 138 mEq/L (ref 135–145)
Total Protein: 6.5 g/dL (ref 6.0–8.3)

## 2014-11-01 LAB — CBC WITH DIFFERENTIAL/PLATELET
Basophils Absolute: 0 10*3/uL (ref 0.0–0.1)
Basophils Relative: 0 % (ref 0–1)
Eosinophils Absolute: 0.2 10*3/uL (ref 0.0–0.7)
Eosinophils Relative: 3 % (ref 0–5)
HCT: 39.5 % (ref 39.0–52.0)
HEMOGLOBIN: 12.3 g/dL — AB (ref 13.0–17.0)
LYMPHS ABS: 0.9 10*3/uL (ref 0.7–4.0)
Lymphocytes Relative: 12 % (ref 12–46)
MCH: 27.7 pg (ref 26.0–34.0)
MCHC: 31.1 g/dL (ref 30.0–36.0)
MCV: 89 fL (ref 78.0–100.0)
MONO ABS: 0.7 10*3/uL (ref 0.1–1.0)
MONOS PCT: 9 % (ref 3–12)
MPV: 9.2 fL (ref 8.6–12.4)
NEUTROS ABS: 6 10*3/uL (ref 1.7–7.7)
Neutrophils Relative %: 76 % (ref 43–77)
Platelets: 267 10*3/uL (ref 150–400)
RBC: 4.44 MIL/uL (ref 4.22–5.81)
RDW: 14.2 % (ref 11.5–15.5)
WBC: 7.9 10*3/uL (ref 4.0–10.5)

## 2014-11-01 LAB — TSH: TSH: 1.045 u[IU]/mL (ref 0.350–4.500)

## 2014-11-01 LAB — BRAIN NATRIURETIC PEPTIDE: Brain Natriuretic Peptide: 211.1 pg/mL — ABNORMAL HIGH (ref 0.0–100.0)

## 2014-11-02 NOTE — Discharge Summary (Signed)
PATIENT NAME:  RENAUD, CELLI MR#:  [id number] DATE OF BIRTH:  01-Jun-1941  DATE OF ADMISSION:  09/02/2011 DATE OF DISCHARGE:  09/08/2011  ADDENDUM: The patient's Methylmalonic acid came back elevated. I called the patient's rehab facility, [facility], and added vitamin B12 1,000 mcg daily to his discharge medication list.  ____________________________ Cherre Huger, MD sp:slb D: 09/08/2011 15:03:35 ET T: 09/09/2011 11:14:14 ET JOB#: [id number]  cc: Cherre Huger, MD, <Dictator> Cherre Huger MD ELECTRONICALLY SIGNED 09/09/2011 19:22

## 2014-11-02 NOTE — Discharge Summary (Signed)
PATIENT NAME:  Christian [name], Christian [name] MR#:  [id number] DATE OF BIRTH:  May 02, 1941  DATE OF ADMISSION:  09/02/2011 DATE OF DISCHARGE:  09/08/2011  DISCHARGE DIAGNOSES:  1. Acute hypercapnic respiratory failure. 2. Altered mental status due to above. 3. Orthostatic hypotension, resolved. 4. Atrial fibrillation with history of cardioversion.  5. Diabetes.  6. Elevated troponins likely due to demand ischemia.  7. Sleep apnea, on CPAP. 8. Severe esophagitis.  9. Coronary artery disease with history of coronary artery bypass graft.  10. Hypertension. 11. Hyperlipidemia.   DISPOSITION: The patient is being discharged to a rehab facility. Continue physical therapy.   DISCHARGE INSTRUCTIONS: Use CPAP while sleeping, at nighttime and nap time. Keep head of bed more than 30 degrees. Continue physical therapy. Followup with Dr. Loletta Specter, Dr. Rockey Situ, and Dr. Damita Dunnings in 1 to 2 weeks after discharge.   ACTIVITY: As tolerated.   DIET: Low sodium, ADA diet, soft and easy to digest, sugar-free lactose-free Carnation Instant Breakfast at 10:00 a.m. and 3:00 p.m.  DISCHARGE MEDICATIONS:  1. Oxygen 2 liters via nasal cannula as needed. 2. Tylenol 650 mg p.o. every six hours p.r.n.  3. NovoLog insulin sliding scale. 4. Zofran ODT 4 mg every six hours p.r.n. nausea and vomiting.  5. Omeprazole 40 mg twice a day.  6. KCl 10 mEq daily.  7. Xarelto 20 mg daily.  8. Flomax 0.4 mg daily.  9. Amiodarone 200 mg twice a day. 10. Senokot one tablet p.o. twice a day, hold for loose stools. 11. Demadex 5 mg daily, hold for systolic blood pressure less than 100. 12. Lopressor 12.5 mg twice a day, hold for systolic blood pressure less than 90 and heart rate less than 60.  13. MiraLax 17 grams daily as needed for constipation.  14. Nitroglycerin 0.4 mg sublingual every 5 minutes x3 p.r.n. chest pain. 15. Digoxin 125 mcg daily.  16. Zyrtec 10 mg daily.  17. Aspirin 81 mg daily.   CONSULTANTS:  1. Efraim Kaufmann, MD -  Palliative Care. 2. Flora Lipps, MD - Pulmonary. 3. Michaela Corner, MD - Neurology. 4. Ida Rogue, MD -Cardiology.  RESULTS: EEG showed mild diffuse slowing consistent with mild to moderate diffuse nonspecific encephalopathy.  CAT scan of the head showed no acute abnormalities.   MRI of the brain showed no acute abnormalities.   CAT scan of the chest showed no evidence of acute pulmonary embolus. Small bilateral pleural effusions layering posteriorly, bilateral atelectasis, and early interstitial pneumonitis. No congestive heart failure.   Echo showed underlying atrial fibrillation. Left ventricular systolic function is normal. Ejection fraction more than 55%. There is mild concentric left ventricular hypertrophy. The right ventricular systolic function is normal. The left atrium is mildly dilated. There is mild MR. Right ventricular systolic pressure is elevated at 30 to 40 mmHg. Mild valvular aortic stenosis   ABG: pH 7.22 initially and currently it is normal at 7.37, PCO2 as high as 107 and currently 87 with elevated bicarbonate.  Normal white count. Hemoglobin ranging from 12.8 to 11.3. Normal platelet count. Mildly elevated cardiac enzymes at 0.09 to 0.08. Normal TSH. Therapeutic digoxin level. Normal LFTs. Normal renal function. Normal electrolytes other than low magnesium, which was supplemented. Bicarbonate ranging from 40 to more than 45.   HOSPITAL COURSE: The patient is a 74 year old male with past medical history of coronary artery disease, diabetes, coronary artery disease status post CABG, diabetes, hypertension, and hyperlipidemia who presented with altered mental status.  1. Acute hypercapnic respiratory failure with respiratory acidosis:  The patient had a history of sleep apnea and used CPAP at bedtime. He recently had sedation for an endoscopy and presented with altered mental status soon afterwards. He had respiratory acidosis with a CO2 of 107 and elevated bicarbonate. He  was initially admitted to the Intensive Care Unit and placed on BiPAP. Pulmonary consultation with Dr. Flora Lipps was obtained who recommended conservative management. With the use of BiPAP, the patient's pH normalized. He continued to have elevated CO2, however, it was metabolically compensated with metabolic alkalosis. The etiology of the patient's hypercapnia was unclear. As per the pulmonologist, it was possibly related to the patient's sleep apnea, possible COPD since the patient was a long time smoker, in addition to receiving sedation for endoscopy recently. The patient likely has chronic CO2 retention in view of metabolic alkalosis. A CAT scan of the chest was also done which showed no major acute abnormalities. The pulmonologist recommended ongoing use of CPAP. There was no clinical suspicion for pneumonia since the patient was afebrile with normal white count.  2. Altered mental status at admission: This was likely secondary to hypercapnia and hypertension. Now the patient is back to baseline. His CAT scan of the head, MRI of the brain, B12 levels, and TSH are all normal. His EEG showed mild to moderate slowing/diffuse nonspecific encephalopathy. The patient was evaluated by Dr. Loletta Specter during the hospitalization. He felt that the patient did not have any Parkinson's. He may have an essential tremor, which is aggravated by the patient's metabolic issues. He recommends outpatient neurology follow-up and possible addition of Aricept once the patient's metabolic issues are improved. He also recommends that the patient may benefit from outpatient psychiatry evaluation for depression.  3. Orthostatic hypotension on presentation: Initially all the patient's blood pressure medications were held. Currently the patient is normotensive.  4. History of chronic atrial fibrillation: The patient is on Xarelto for anticoagulation. His digoxin level is therapeutic. He has a history of cardioversion in the past. He was  evaluated by Dr. Rockey Situ during the hospitalization. Since the patient's blood pressure is a little bit labile, he has added amiodarone for better heart rate control. The patient will continue to need low-dose Demadex and beta blocker. Echo shows normal ejection fraction.  5. The patient also had mildly elevated troponins and this was felt to be due to hypertension and the patient's respiratory status resulting in demand ischemia on presentation. 6. Diabetes: The patient's hemoglobin A1c is 6.1. His oral intake is variable, therefore, his metformin has been placed on hold. He is currently on insulin sliding scale.  7. Sleep apnea: The patient and the family have been counseled about using CPAP at nap times and at bedtime.  8. Severe esophagitis: The patient's recent endoscopy showed that he has severe esophagitis and is currently on Prilosec 40 mg twice a day. He should also keep his head elevated more than 30 degrees at all times.   All of the patient's family's questions were answered. The patient is being discharged in a stable condition.   TIME SPENT: 45 minutes. ____________________________ Cherre Huger, MD sp:slb D: 09/08/2011 08:04:09 ET T: 09/08/2011 08:21:28 ET JOB#: [id number]  cc: Cherre Huger, MD, <Dictator> Cherre Huger MD ELECTRONICALLY SIGNED 09/08/2011 15:47

## 2014-11-02 NOTE — Consult Note (Signed)
General Aspect 74 year old gentleman with a history of coronary artery disease, CABG x3 at [facility] in 2009, diabetes, history of DVT on the left with chronic lower extremity edema, catheterization in March 2009 showing patent grafts with elevated pulmonary pressures who developed atrial fibrillation at the beginning of the summer, cardioversion 04/20/2010, back into atrial fib in 2012, rapid weight loss of  50 pound weight loss over the past year, anorexia, lack of interest,  periods of hallucinations and memory problems, recent EGD showing severe esophagitis (09/01/2011), presenting with SOB. H/o medication noncompliance with diuretic and statin.  Cardiology as consulted for elevated cardiac enz, atrial fib.  Over the past few visits to the office, he has appeared withdrawn, confused, needing higher level of help, anorexia, weight loss, inability to communicate well and provide a history.  He had an upper gastrointestinal endoscopy as an outpatient procedure on 09/01/2011 in teh AM,  given some sedation during the procedure. When evaluated by family 09/02/11,  he was not responding well, lethargic, some dizziness. They tried to sit him up and the family said that he almost collapsed. He was extremely weak at that time.  in the Emergency Room his mental status has improved with IV hydration.  He denies any headache right now. No change in his vision. He denies any chest pain or shortness of breath. No nausea, vomiting, or abdominal pain. Orthostatic in the ER.   He reports taking Lasix one half dose  2 to 3 times per week. He has significant shortness of breath with walking such as we're going to get the mail.  He does not like taking Lasix as he reports having uncontrollable urination the next day.   He has not been taking his cholesterol medication. Recent cholesterol is 285, LDL 222    Present Illness . EGD  09/01/2011: ENDOSCOPIC IMPRESSION: 1) Severe esophagitis, which is felt to explain  trouble swallowing.  Biopsies obtained. 2) Small hiatal hernia 3) Moderate gastritis in the antrum.  Biopsies obtained. 4) Normal duodenum  Echocardiogram March 2009 showed ejection fraction 50-55%, mild aortic stenosis, mild pulmonary hypertension with right ventricular systolic pressure estimated at 32 mm of mercury, aortic valve area of 1.7 cm??, mildly dilated left atrium    Cardiac catheterization March 2009 showed 60% left main disease, 25% LAD disease, 50% circumflex disease, 40% OM1 disease, 30% OM 2 disease, 100% RCA disease, patent bypass grafts with a saphenous vein graft to the OM 2, LIMA to the LAD, graft to the PDA  SOCIAL HISTORY: He lives by himself. He has difficulty ambulating as per the family members. No smoking or alcohol use.   FAMILY HISTORY: Father had congestive heart failure. Mother had an aneurysm. Extensive history of heart disease in the family.   Physical Exam:   GEN WD, WN, NAD    HEENT red conjunctivae    NECK supple    RESP normal resp effort  clear BS    CARD Irregular rate and rhythm  Murmur    Murmur Systolic    ABD soft    LYMPH negative neck    EXTR positive edema, 2 + pitting    SKIN normal to palpation    NEURO motor/sensory function intact    PSYCH alert, A+O to time, place, person, poor insight   Review of Systems:   Subjective/Chief Complaint SOB, lethargic    General: Weakness     Skin: No Complaints     ENT: No Complaints     Eyes: No  Complaints     Neck: No Complaints     Respiratory: Short of breath     Cardiovascular: No Complaints     Gastrointestinal: No Complaints     Genitourinary: No Complaints     Vascular: No Complaints     Musculoskeletal: No Complaints     Neurologic: No Complaints     Hematologic: No Complaints     Endocrine: No Complaints     Psychiatric: Depression     Review of Systems: All other systems were reviewed and found to be negative     Medications/Allergies Reviewed  Medications/Allergies reviewed        CABG:    Diabetes:    Atrial Fibrillation:    hypertension:          Admit Diagnosis:   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, Active, ORTHOSTATIC HYPOTENSION ALKALOSIS  Home Medications:  Medication Instructions Status  aspirin 81 mg oral tablet, chewable 2 tab(s) orally  Active  Zyrtec 10 mg oral tablet 1 tab(s) orally once a day Active  Lanoxin 125 mcg (0.125 mg) oral tablet 1 tab(s) orally once a day Active  lisinopril 10 mg oral tablet 1 tab(s) orally once a day Active  Glucophage 500 mg oral tablet 1 tab(s) orally 2 times a day Active  Prilosec 20 mg oral delayed release capsule 1 cap(s) orally once a day Active  Betapace 120 mg oral tablet 1 tab(s) orally 2 times a day Active  Pradaxa 150 mg oral capsule 1 cap(s) orally 2 times a day Active  nitroglycerin  sublingual every 5 minutes, As Needed- for Chest Pain  Active  MiraLax oral powder for reconstitution 1 dose(s) orally , As Needed- for Constipation  Active  Demadex 20 mg oral tablet 1 tab(s) orally once a day Active  potassium chloride 20 mEq oral tablet, extended release 1 tab(s) orally 2 times a day Active   EKG:   Interpretation EKG shows atrial fib with rate of 68 bpm, T wave ABN V1 to V6    No Known Allergies:   Vital Signs/Nurse's Notes:  **Vital Signs.:   23-Feb-13 11:00   Vital Signs Type Routine   Pulse Pulse 88   Respirations Respirations 26   Systolic BP Systolic BP 939   Diastolic BP (mmHg) Diastolic BP (mmHg) 78   Mean BP 104   Pulse Ox % Pulse Ox % 88   Oxygen Delivery bipap   Pulse Ox Heart Rate 88     Impression 74 year old gentleman with a history of coronary artery disease, CABG x3 at [facility] in 2009, diabetes, history of DVT on the left with chronic lower extremity edema, catheterization in March 2009 showing patent grafts with elevated pulmonary pressures who developed atrial fibrillation at the beginning of the summer, cardioversion  04/20/2010, back into atrial fib in 2012, rapid weight loss of  50 pound weight loss over the past year, anorexia, lack of interest,  periods of hallucinations and memory problems, recent EGD showing severe esophagitis (09/01/2011), presenting with SOB. H/o medication noncompliance with diuretic and statin.  Cardiology as consulted for elevated cardiac enz, atrial fib.  1) Hypercapnea/respiratory depression Possibly secondary to re4cent sedation meds from EGD in settin of possible underlying neurologic disorder. --He has had decline in behavior for months, weight loss. --Unable to exclude major depression Stable on bipap for now.  2) Atrial fib: Repeat echo today. Rate well controlled. Patient is fixated on arrhythmia, does not feel well. Based on anatomy, coulod figure out if we can convert  to NSR --He does not like pradaxa. Feels it is causing throat pain. Will change to xerelto 20 mg daily  3) Edema Long hx of edema even with no SOB, low BNP Felt secondary to venous insufficiency Possible contribution from poor nutrition.  4) Anorexia Possibly from underlying severe esophagitis Possible depression contributing Unable to exclude underlying neurologic issue (Lewy body or something similar)  5) CAD, h/o CABG stable. No significant cardiac enz elevation concerning for ischemia   Electronic Signatures: Ida Rogue (MD)  (Signed 23-Feb-13 12:06)  Authored: General Aspect/Present Illness, History and Physical Exam, Review of System, Past Medical History, Health Issues, Home Medications, EKG , Allergies, Vital Signs/Nurse's Notes, Impression/Plan   Last Updated: 23-Feb-13 12:06 by Ida Rogue (MD)

## 2014-11-02 NOTE — H&P (Signed)
PATIENT NAME:  Christian [name], SCHLARB MR#:  [id number] DATE OF BIRTH:  17-Feb-1941  DATE OF ADMISSION:  09/02/2011  PRIMARY CARE PHYSICIAN: Dr. [name], [facility]  CARDIOLOGIST: Dr. Rockey Situ   CHIEF COMPLAINT: History was obtained from the patient and the family members, the son and the daughter, who are very well informed.  The patient was brought here because the patient was not responding well this morning. He was lethargic and weak. EMS was called because of weakness.   HISTORY OF PRESENT ILLNESS: This is a 74 year old male with history of chronic atrial fibrillation on Pradaxa, history of coronary artery disease with previous coronary artery bypass graft, diabetes, hypertension, and hyperlipidemia. The patient has had a poor appetite with  weight loss of about 50 pounds in six months and dysphagia for about six months. He had an upper gastrointestinal endoscopy done yesterday at the [facility] in [place] by Dr. Hilarie Fredrickson which showed that the patient has severe esophagitis, a small hiatal hernia, and moderate gastritis. He recommended increasing omeprazole to 40 mg b.i.d. for 12 weeks and then repeat EGD. The patient had upper gastrointestinal endoscopy as an outpatient procedure yesterday from 8:30 to 10:30.  He was given some sedation during the procedure. This morning when his children checked on him he was not responding well. He was quite lethargic. He was also complaining of some dizziness this morning. They tried to sit him up and the family said that he almost collapsed. He was extremely weak at that time. Since being in the Emergency Room his mental status has improved with IV hydration. He is still not at baseline. He denies any headache right now. No change in his vision. He denies any chest pain or shortness of breath. No nausea, vomiting, or abdominal pain. He is complaining of dark-colored urine and he sometimes complains of dysuria also. As per the family he is supposed to take Demadex  20 mg daily but he is not taking his Demadex because he has to pee multiple times after taking Demadex and he does not feel well after taking Demadex so he has not taken his diuretic for about a week now. In the Emergency Room he was noted to have a bicarbonate of 40. His troponin was slightly elevated at 0.09. CT of the head was negative. His chest and abdominal films were negative. Also he had orthostatics done in the Emergency Room when he came in. He was 100/70 sitting with 70/30 standing. He is being admitted for near syncope, orthostatic hypotension, and extreme weakness.   REVIEW OF SYSTEMS:  Positive for weakness, poor appetite, and weight loss. No acute change in vision. No headache, but he was complaining of dizziness.  No cough. No dyspnea. No chest pain. No dyspnea on exertion. No nausea, vomiting, abdominal pain, or gastrointestinal bleed. He is complaining of some mild dysuria but no frequency or incontinence. No thyroid problems. No anemia.  No rash. No joint pains. He has chronic lower extremity swelling more on the left side. He denies any focal numbness or weakness. No anxiety or depression.   PAST MEDICAL HISTORY:  1. Coronary artery disease with previous coronary artery bypass graft.  2. Hypertension.  3. Diabetes.  4. Hyperlipidemia.  5. Chronic atrial fibrillation on Pradaxa. He has previous history of cardioversion also.  6. History of deep vein thrombosis in the past about 20 years ago with chronic left lower extremity swelling. 7. Recent upper GI endoscopy yesterday which showed severe esophagitis.   PAST SURGICAL  HISTORY:  Coronary artery bypass graft.   ALLERGIES TO MEDICATIONS: Crestor and tramadol.   HOME MEDICATIONS:  1. Aspirin 81 mg 2 tablets daily. 2. Betapace 120 mg b.i.d.  3. Glucophage 500 mg b.i.d.  4. Digoxin 0.125 mg daily.  5. Lisinopril 10 mg daily.  6. Nitroglycerin p.r.n.  7. Pradaxa 150 mg b.i.d.  8. Prilosec has been increased to 40 mg twice a  day. 9. Zyrtec 10 mg daily.  He is not taking Demadex and Klor-Con right now.   SOCIAL HISTORY: He lives by himself. He has difficulty ambulating as per the family members. No smoking or alcohol use.   FAMILY HISTORY: Father had congestive heart failure. Mother had an aneurysm. Extensive history of heart disease in the family.   PHYSICAL EXAMINATION:  VITAL SIGNS: When he presented to the Emergency Room, temperature 98.9, heart rate 65, respiratory 18, blood pressure 131/81, saturating 97% on 2 liters nasal cannula.  Right now heart rate 75, blood pressure 140/67, saturating 98% on 2 liters nasal cannula.   GENERAL: This is an elderly Caucasian male, well built. He appears to be weak. Speaking very slowly.   HEENT: Bilateral pupils are equal and reactive. Extraocular muscles are intact. No scleral icterus. No conjunctivitis. Oral mucosa slightly dry. No pallor.   NECK: No thyroid tenderness, enlargement, or nodule. Neck is supple. No masses, nontender. No adenopathy. No JVD. No carotid bruit.   CHEST: Bilateral breath sounds are essentially clear, some minimal crackles at the left base.   HEART: Heart sounds are irregular. There is a murmur. Peripheral pulses 1+. He has bilateral lower extremity edema, more on the left side.   ABDOMEN: Soft, nontender. Normal bowel sounds. No hepatosplenomegaly. No bruit. No masses.   RECTAL: Exam deferred.  NEUROLOGIC: He is awake. He is alert. He knows that he is in the hospital. He knows his address. He knows the month.  He knows his birth date. Cranial nerves III, IV, VI, VII, and XII are normal. No pronator drift. He is moving all extremities against gravity. Difficult to raise the left lower extremity because of edema.   EXTREMITIES: No cyanosis. No clubbing.   SKIN: He has bilateral lower extremity edema, more on the left side.   LABORATORY, DIAGNOSTIC, AND RADIOLOGICAL DATA:  White count 5.5, hemoglobin 12.8, platelet count 220,000. BMP: Sodium  139, potassium 4.2, BUN 13, creatinine 1.24, glucose 122, bicarbonate of 40, albumin of 3. CK 43. Troponin is 0.09, lipase 151, digoxin 1.44. INR is 1. CT of the head is negative. His chest x-ray and abdominal film are negative. EKG shows that he has atrial fibrillation. He has some Q waves in inferior leads. He has T-wave inversions in anterolateral leads. No significant change from prior EKGs October 2011.   IMPRESSION:  1. Weakness, most likely secondary to orthostatic hypotension.  2. Dehydration.  3. Metabolic alkalosis may be contraction alkalosis may be secondary to dehydration.  4. Elevated troponins.  5. Coronary artery disease.  6. Hypertension.  7. Diabetes.  8. Hyperlipidemia.  9. Chronic atrial fibrillation.  10. Recent upper GI endoscopy yesterday suggestive of severe esophagitis.   PLAN:  This is a 74 year old male with history of coronary artery disease, hypertension, hyperlipidemia, and chronic atrial fibrillation on Pradaxa. He has been complaining of dysphagia and weight loss. He had an upper GI endoscopy done yesterday which was suggestive of severe esophagitis, may be a reason for his dysphagia.  His omeprazole has been increased to 40 mg b.i.d. Today the  family noticed that he was lethargic, weak, and not that responsive. His mental status has improved during the ER stay. Most likely this was related to dehydration. With  fluids he is alert and oriented at this time but still weak. Bicarbonate is 40. This most likely looks like contraction alkalosis. I am going to check an ABG also. His CT of the head is negative. I will place him on neuro checks. We will give him some gentle hydration. We will repeat his orthostatics. He was orthostatic when he arrived in the Emergency Room.  We will get a cardiology consult because of elevated troponins but it does not look like acute coronary syndrome. His elevated troponins could be related to hypotension also. We will trend it. He is already  on aspirin. We will continue that. We will continue Pradaxa for his atrial fibrillation. We will continue Betapace and digoxin but will hold his lisinopril at this time because of orthostatic hypotension. I am not ordering an MRI of the brain at this time, but if his mental status does not improve to baseline then he may need an MRI of the brain tomorrow.  I am going to give him omeprazole 40 mg twice a day as suggested by [facility] because of his severe esophagitis. I am also going to get a  PT consult because of his severe weakness. I had a detailed discussion with the family members and  the patient.        TIME SPENT ON ADMISSION AND COORDINATION OF CARE: 55 minutes.  ____________________________ Mena Pauls, MD ag:bjt D: 09/02/2011 14:12:21 ET T: 09/02/2011 14:37:57 ET JOB#: [id number]  cc: Mena Pauls, MD, <Dictator> [name], MD  Mena Pauls MD ELECTRONICALLY SIGNED 09/30/2011 11:44

## 2014-11-02 NOTE — H&P (Signed)
PATIENT NAME:  Christian [name], Christian [name] MR#:  [id number] DATE OF BIRTH:  [birth year]  DATE OF ADMISSION:  09/02/2011  ADDENDUM: His ABG showed that the patient has a pH of 7.22, pCO2 107, pO2 of 99, and FiO2 28%. I am going to start him on BiPAP stat, and we will repeat an ABG later on. The patient has acute hypercapnic respiratory failure.   ____________________________ Mena Pauls, MD ag:cbb D: 09/02/2011 15:02:03 ET T: 09/02/2011 15:16:19 ET JOB#: [id number]  cc: Mena Pauls, MD, <Dictator> [name], MD Mena Pauls MD ELECTRONICALLY SIGNED 09/30/2011 11:45

## 2014-11-03 DIAGNOSIS — L570 Actinic keratosis: Secondary | ICD-10-CM | POA: Insufficient documentation

## 2014-11-03 NOTE — Assessment & Plan Note (Signed)
Not tachy, his functional status is good compared to prev historical levels. Has BLE edema today but that should improve with his diuretic dose later today.   Continue as is with low salt diet and meds as is.  See notes on labs.  Likely with AF and his meds contributing to his noted fatigue.  Other than BLE edema today, appears euvolemic and still okay for outpatient f/u.

## 2014-11-03 NOTE — Assessment & Plan Note (Signed)
Continue positive pressure device at home with sleep.

## 2014-11-03 NOTE — Assessment & Plan Note (Addendum)
Not tachy, his functional status is good compared to prev historical levels. Has BLE edema today but that should improve with his diuretic dose later today.   Continue as is with low salt diet and meds as is.  See notes on labs.  Likely with AF and his meds contributing to his noted fatigue.  Other than BLE edema today, appears euvolemic and still okay for outpatient f/u.  >25 minutes spent in face to face time with patient, >50% spent in counselling or coordination of care

## 2014-11-03 NOTE — Assessment & Plan Note (Signed)
D/w pt, mild, he declined at this point.  We can refer to derm if needed, or I can freeze with liq N2.  He'll consider.  No ulceration and these and be treated later on when desired.  Potentially precancerous, but not urgent for treatment.  D/w pt.  He agrees.

## 2014-11-21 ENCOUNTER — Ambulatory Visit (INDEPENDENT_AMBULATORY_CARE_PROVIDER_SITE_OTHER): Payer: Medicare HMO | Admitting: Cardiovascular Disease

## 2014-11-21 ENCOUNTER — Encounter: Payer: Self-pay | Admitting: Cardiovascular Disease

## 2014-11-21 VITALS — BP 100/60 | HR 108 | Ht 71.0 in | Wt 190.5 lb

## 2014-11-21 DIAGNOSIS — I5032 Chronic diastolic (congestive) heart failure: Secondary | ICD-10-CM

## 2014-11-21 DIAGNOSIS — N529 Male erectile dysfunction, unspecified: Secondary | ICD-10-CM

## 2014-11-21 DIAGNOSIS — I25810 Atherosclerosis of coronary artery bypass graft(s) without angina pectoris: Secondary | ICD-10-CM

## 2014-11-21 DIAGNOSIS — I951 Orthostatic hypotension: Secondary | ICD-10-CM | POA: Diagnosis not present

## 2014-11-21 DIAGNOSIS — I4891 Unspecified atrial fibrillation: Secondary | ICD-10-CM

## 2014-11-21 DIAGNOSIS — I10 Essential (primary) hypertension: Secondary | ICD-10-CM

## 2014-11-21 DIAGNOSIS — R5382 Chronic fatigue, unspecified: Secondary | ICD-10-CM

## 2014-11-21 DIAGNOSIS — R5383 Other fatigue: Secondary | ICD-10-CM | POA: Insufficient documentation

## 2014-11-21 NOTE — Assessment & Plan Note (Signed)
He takes midodrine once in the morning, does not like to take this later in the day as he has side effects

## 2014-11-21 NOTE — Patient Instructions (Signed)
You are doing well. No medication changes were made.  We will check your testosterone today If you would like pulmonary rehab please call the office   Please call us if you have new issues that need to be addressed before your next appt.  Your physician wants you to follow-up in: 6 months.  You will receive a reminder letter in the mail two months in advance. If you don't receive a letter, please call our office to schedule the follow-up appointment.

## 2014-11-21 NOTE — Assessment & Plan Note (Signed)
Chronic atrial fibrillation. Unable to titrate medications for additional rate control given his low blood pressure On anticoagulation

## 2014-11-21 NOTE — Assessment & Plan Note (Signed)
Currently with no symptoms of angina. No further workup at this time. Continue current medication regimen. 

## 2014-11-21 NOTE — Assessment & Plan Note (Signed)
Appears relatively euvolemic. Chronic leg edema likely from lymphedema He denies any skin breakdown Recommended leg elevation when he sits at home which he is not doing on a regular basis

## 2014-11-21 NOTE — Assessment & Plan Note (Signed)
Etiology of his fatigue concerning for depression, deconditioning. He is not doing much from home anymore Previously participated in lung works at the hospital and felt well. We will check a testosterone first as he reports this was low before. Suggested they call us if they would like referral to pulmonary rehabilitation.

## 2014-11-21 NOTE — Progress Notes (Signed)
Patient ID: Christian [name], male    DOB: [birth year], 74 y.o.   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 He has chronic atrial fibrillation. He presents for follow-up of his atrial fibrillation, chronic diastolic CHF  In follow-up today, he is doing well, has stable lower extremity edema, denies shortness of breath or fluid retention. Compliant with his BiPAP Biggest problem is fatigue. He is not exercising, feeling very weak. Unclear if depression is an issue Seeing a chiropractor which is helping his chronic back and arm pain, neck pain. Family is happy with progress there. Takes midodrine only in the morning for low blood pressure. Reports having dizziness if he takes midodrine later in the day. Does not take torsemide daily. Does not have a high fluid intake Limited by chronic leg pain. Wonders if he has sciatica.  EKG on today's visit shows atrial fibrillation with ventricular rate 108 bpm  Other past medical history lab work  01/15/2014. CBC, BMP all normal, hemoglobin A1c well controlled  Remote h/o profound esophagitis, weight loss of more than 50 pounds, anorexia, lack of interest, food getting stuck in his throat, difficulty swallowing dry mouth, periods of hallucinations and memory problems.  hospital at [facility] September 02 2011 following an EGD that showed severe esophagitis, moderate gastritis,.  cardioversion  for atrial fibrillation which was successful though in followup he converted back to atrial fibrillation. asymptomatic in June 2013 when seen in clinic while in atrial fibrillation .  His biggest complaint is that he  has dizziness after he eats Several prior admissions to the hospital for failure to thrive, leg weakness, mental status changes, hypoxia, hypercapnic respiratory distress.   Sees Dr. Gwenette Greet of pulmonary,doing well on a noninvasive ventilator . No recent hospital admissions for hypercapnia and confusion.   Previous  Echocardiogram showed normal LV systolic function greater than 55%, borderline elevated right ventricular systolic pressures pulmonic aortic valve stenosis. an  Allergies  Allergen Reactions  . Ambien [Zolpidem Tartrate]     Over sedation  . Crestor [Rosuvastatin Calcium]     myalgias  . Other     Sedative or medications for sleep.  . Pradaxa [Dabigatran Etexilate Mesylate]     Inflammation in throat.  . Tramadol     Intolerant but not an allergy; sedation    Current Outpatient Prescriptions on File Prior to Visit  Medication Sig Dispense Refill  . acetaminophen (TYLENOL) 500 MG tablet Take 500 mg by mouth every 6 (six) hours as needed.    . dronedarone (MULTAQ) 400 MG tablet Take 1 tablet (400 mg total) by mouth 2 (two) times daily with a meal. 180 tablet 3  . menthol-cetylpyridinium (CEPACOL) 3 MG lozenge Take 1 lozenge by mouth as needed for sore throat.    . metoprolol tartrate (LOPRESSOR) 25 MG tablet Take 1 tablet (25 mg total) by mouth 2 (two) times daily. 60 tablet 12  . midodrine (PROAMATINE) 10 MG tablet TAKE 1 TABLET BY MOUTH THREE TIMES DAILY 90 tablet 3  . nitroGLYCERIN (NITROSTAT) 0.4 MG SL tablet Place 0.4 mg under the tongue every 5 (five) minutes as needed.      . NON FORMULARY Oxygen @ 2  liters at bedtime.    Marland Kitchen omeprazole (PRILOSEC) 20 MG capsule TAKE 1 CAPSULE BY MOUTH EVERY DAY 90 capsule 1  . polyethylene glycol powder (GLYCOLAX/MIRALAX) powder Take 17 g by mouth daily. 255 g 3  . potassium chloride (K-DUR,KLOR-CON) 10 MEQ tablet Take 10 mEq by mouth every other day.    . spironolactone (ALDACTONE) 25 MG tablet Take 25 mg by mouth every other day.      . tamsulosin (FLOMAX) 0.4 MG CAPS capsule TAKE 1 CAPSULE BY MOUTH EVERY EVENING AFTER SUPPER 90 capsule 1  . torsemide (DEMADEX) 20 MG tablet Take 1 tablet (20 mg total) by mouth every other day. 15 tablet 3  . triamcinolone cream (KENALOG) 0.1 % Apply 1 application topically 2 (two) times daily. 30 g 0  . XARELTO 20 MG TABS tablet TAKE 1 TABLET BY MOUTH EVERY DAY 30 tablet 3  . [DISCONTINUED] dabigatran (PRADAXA) 150 MG CAPS Take 1 capsule (150 mg total) by mouth every 12 (twelve) hours. 60 capsule 6  . [DISCONTINUED] lisinopril (PRINIVIL,ZESTRIL) 10 MG tablet Take 1 tablet (10 mg total) by mouth daily. 30 tablet 3  . [DISCONTINUED] metFORMIN (GLUCOPHAGE) 500 MG tablet Take 1 tablet (500 mg total) by mouth 2 (two) times daily with a meal. Intolerant of >500mg  per day 180 tablet 3  . [DISCONTINUED] sotalol (BETAPACE) 120 MG tablet Take 1 tablet (120 mg total) by mouth 2 (two) times daily. 60 tablet 6   No current facility-administered medications on file prior to visit.    Past Medical History  Diagnosis Date  . Coronary artery disease   . Diabetes mellitus   . Hyperlipidemia   . Hypertension   . GERD (gastroesophageal reflux disease)   . Arthritis   . Diastolic CHF, chronic   . Kidney stones   . Atrial fibrillation     DCCV 04/2010  . CO2 retention     during admission to [facility] 2013  . Acute renal failure   . Hepatic encephalopathy     2013  . 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 Coronary artery bypass graft  2009    x 3 @ [facility]  . Cardiac catheterization  2009    showing patent grafts with elevated pulmonary pressures  . Colonoscopy  2011    negative per report  . Iron transfusion      Social History  reports that he quit smoking about 33 years ago. His smoking use included Cigarettes. He has a 45 pack-year smoking history. He has never used smokeless tobacco. He reports that he does  not drink alcohol or use illicit drugs.  Family History family history includes Aneurysm in his mother; Arthritis in his mother; Diabetes in his father; Heart disease in his brother, father, and sister; Heart failure in his father; Hyperlipidemia in his father; Hypertension in his mother; Stroke in his father.  Review of Systems  Constitutional: Positive for fatigue.  HENT: Negative.   Eyes: Negative.   Respiratory: Negative.   Cardiovascular: Positive for leg swelling.  Gastrointestinal: Negative.   Musculoskeletal: Positive for gait problem.  Neurological: Positive for weakness.  Hematological: Negative.   Psychiatric/Behavioral: Negative.   All other systems reviewed and are negative.   BP 100/60 mmHg  Pulse 108  Ht 5\' 11"  (1.803 m)  Wt 190 lb 8 oz (86.41 kg)  BMI 26.58 kg/m2  Physical Exam  Constitutional: He is oriented to person,  place, and time. He appears well-developed and well-nourished.  HENT:  Head: Normocephalic.  Nose: Nose normal.  Mouth/Throat: Oropharynx is clear and moist.  Eyes: Conjunctivae are normal. Pupils are equal, round, and reactive to light.  Neck: Normal range of motion. Neck supple. No JVD present.  Cardiovascular: Normal rate, S1 normal, S2 normal and intact distal pulses.  An irregularly irregular rhythm present. Exam reveals no gallop and no friction rub.   Murmur heard.  Systolic murmur is present with a grade of 2/6  Trace to 1+ pitting edema to below the knees, compression hose in place  Pulmonary/Chest: Effort normal and breath sounds normal. No respiratory distress. He has no wheezes. He has no rales. He exhibits no tenderness.  Abdominal: Soft. Bowel sounds are normal. He exhibits no distension. There is no tenderness.  Musculoskeletal: Normal range of motion. He exhibits edema. He exhibits no tenderness.  Lymphadenopathy:    He has no cervical adenopathy.  Neurological: He is alert and oriented to person, place, and time. Coordination  normal.  Skin: Skin is warm and dry. No rash noted. No erythema.  Psychiatric: He has a normal mood and affect. His behavior is normal. Judgment and thought content normal.      Assessment and Plan   Nursing note and vitals reviewed.

## 2014-11-22 LAB — TESTOSTERONE, FREE, TOTAL, SHBG: Testosterone, Free: 4.3 pg/mL — ABNORMAL LOW (ref 6.6–18.1)

## 2014-11-26 ENCOUNTER — Ambulatory Visit: Payer: Medicare HMO | Admitting: Cardiovascular Disease

## 2014-11-26 ENCOUNTER — Other Ambulatory Visit: Payer: Self-pay | Admitting: Family Medicine

## 2014-11-26 DIAGNOSIS — E291 Testicular hypofunction: Secondary | ICD-10-CM

## 2014-12-09 ENCOUNTER — Other Ambulatory Visit (INDEPENDENT_AMBULATORY_CARE_PROVIDER_SITE_OTHER): Payer: Medicare HMO

## 2014-12-09 DIAGNOSIS — E291 Testicular hypofunction: Secondary | ICD-10-CM

## 2014-12-09 LAB — TESTOSTERONE: Testosterone: 212.97 ng/dL — ABNORMAL LOW (ref 300.00–890.00)

## 2014-12-09 LAB — LUTEINIZING HORMONE: LH: 9.18 m[IU]/mL (ref 3.10–34.60)

## 2014-12-29 ENCOUNTER — Telehealth: Payer: Self-pay | Admitting: *Deleted

## 2014-12-29 MED ORDER — RIVAROXABAN 20 MG PO TABS: 20.0000 mg | ORAL_TABLET | Freq: Every day | ORAL | Status: DC

## 2014-12-29 NOTE — Telephone Encounter (Signed)
Rx refill for Xarelto sent to [facility].

## 2015-01-02 ENCOUNTER — Other Ambulatory Visit: Payer: Self-pay | Admitting: Family Medicine

## 2015-01-05 ENCOUNTER — Other Ambulatory Visit: Payer: Self-pay

## 2015-01-05 IMAGING — CR DG CHEST 1V PORT
1 series · 1 of 1 positions shown · non-contrast
Comparison: none

REASON FOR EXAM: Shortness of Breath
COMMENTS:

[ap]
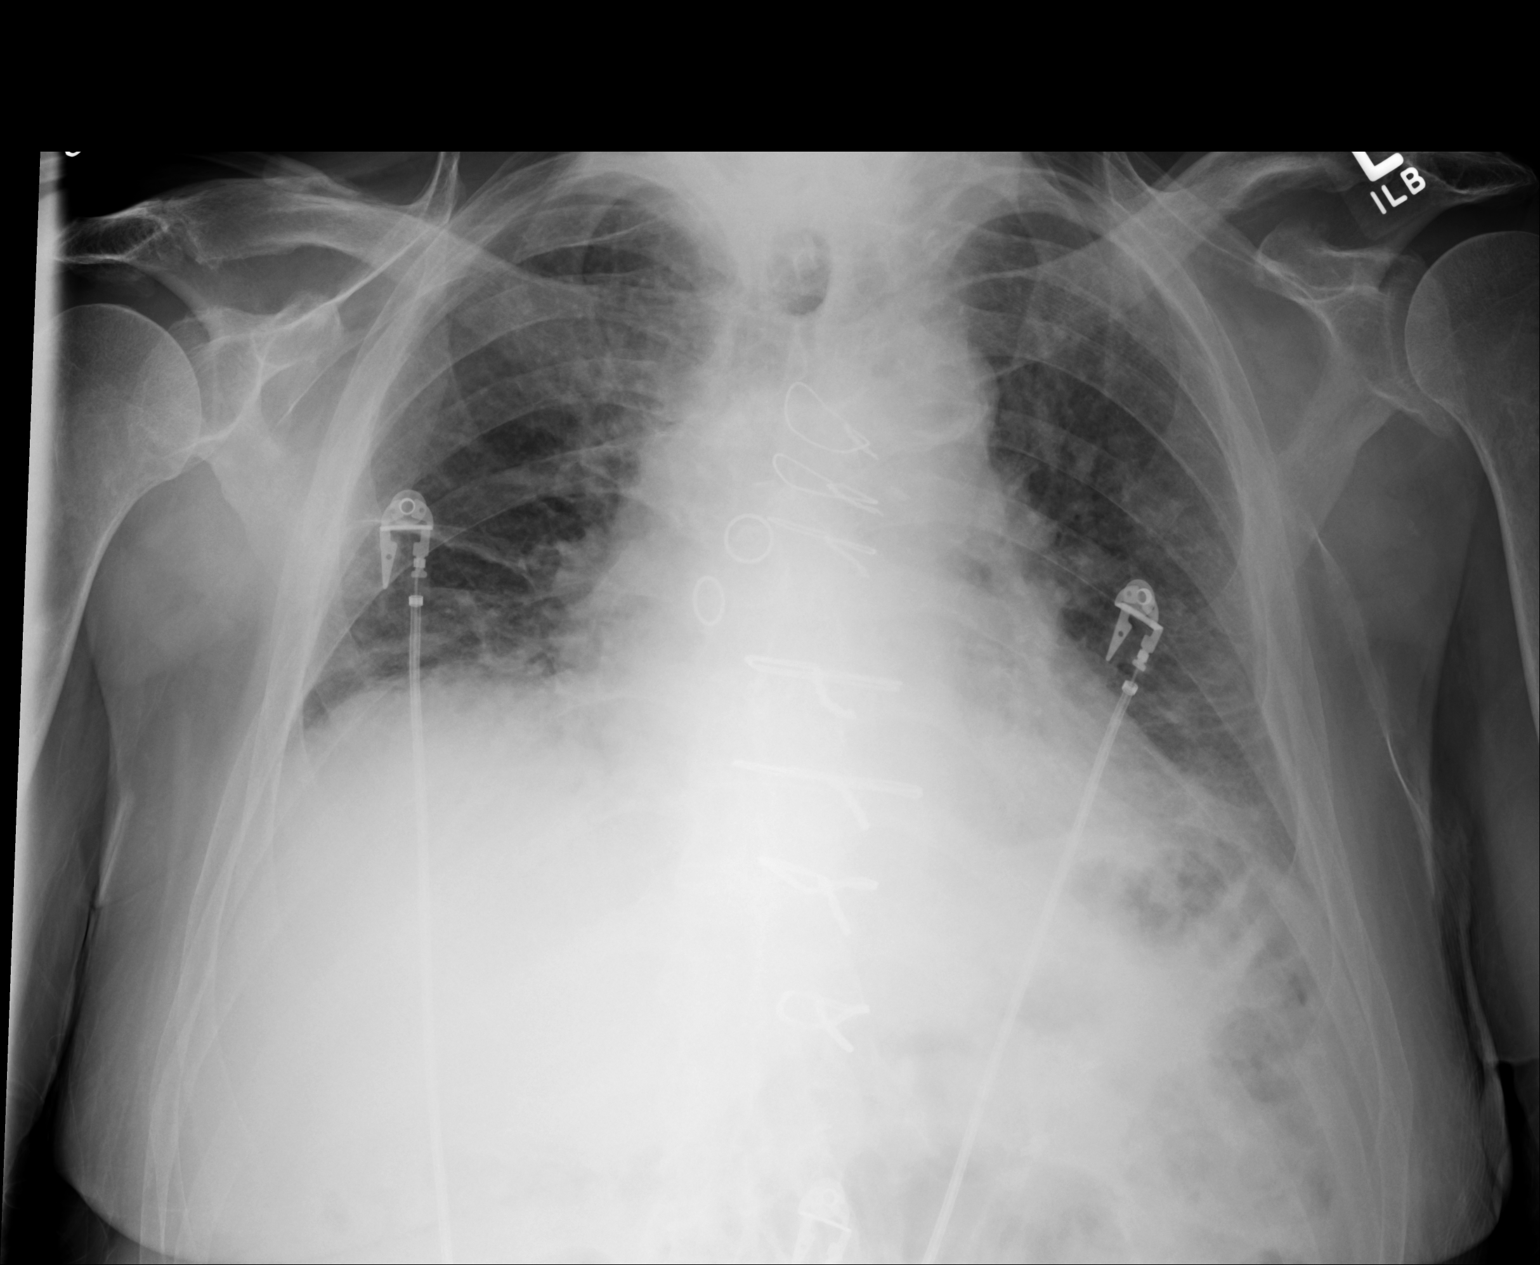

[1 of 1 positions shown; findings below may reference images not displayed]

PROCEDURE:     DXR - DXR PORTABLE CHEST SINGLE VIEW  - September 25, 2012  [DATE]

RESULT:     Comparison is made to the study April 29, 2012.

The lungs are mildly hypoinflated. The interstitial markings are increased
bilaterally. The cardiac silhouette is enlarged. The pulmonary vascularity
is engorged. The patient has undergone previous CABG.
IMPRESSION: The findings are consistent with CHF. A followup PA and
lateral chest x-ray would be of value when the patient can tolerate the
procedure.

[REDACTED]

## 2015-01-13 ENCOUNTER — Inpatient Hospital Stay: Payer: Medicare HMO

## 2015-01-13 ENCOUNTER — Inpatient Hospital Stay: Payer: Medicare HMO | Attending: Internal Medicine

## 2015-01-13 ENCOUNTER — Other Ambulatory Visit: Payer: Self-pay

## 2015-01-13 ENCOUNTER — Other Ambulatory Visit: Payer: Self-pay | Admitting: Family Medicine

## 2015-01-13 VITALS — BP 108/75 | HR 109 | Temp 96.4°F | Resp 20

## 2015-01-13 DIAGNOSIS — Z79899 Other long term (current) drug therapy: Secondary | ICD-10-CM | POA: Diagnosis not present

## 2015-01-13 DIAGNOSIS — D509 Iron deficiency anemia, unspecified: Secondary | ICD-10-CM

## 2015-01-13 LAB — IRON AND TIBC
IRON: 43 ug/dL — AB (ref 45–182)
Saturation Ratios: 18 % (ref 17.9–39.5)
TIBC: 241 ug/dL — ABNORMAL LOW (ref 250–450)
UIBC: 198 ug/dL

## 2015-01-13 LAB — HEMOGLOBIN: Hemoglobin: 13.4 g/dL (ref 13.0–18.0)

## 2015-01-13 LAB — FERRITIN: Ferritin: 623 ng/mL — ABNORMAL HIGH (ref 24–336)

## 2015-01-13 MED ORDER — SODIUM CHLORIDE 0.9 % IV SOLN
200.0000 mg | Freq: Once | INTRAVENOUS | Status: AC
  Administered 2015-01-13: 200 mg via INTRAVENOUS
  Filled 2015-01-13: qty 10

## 2015-01-23 ENCOUNTER — Other Ambulatory Visit: Payer: Self-pay | Admitting: Family Medicine

## 2015-01-26 ENCOUNTER — Telehealth: Payer: Self-pay | Admitting: *Deleted

## 2015-01-26 NOTE — Telephone Encounter (Signed)
Called patient to see if he would like to move appt up from next Tues to this Thursday 01/29/15.

## 2015-01-29 ENCOUNTER — Encounter: Payer: Self-pay | Admitting: Internal Medicine

## 2015-01-29 ENCOUNTER — Other Ambulatory Visit: Payer: Self-pay | Admitting: Family Medicine

## 2015-01-29 ENCOUNTER — Ambulatory Visit (INDEPENDENT_AMBULATORY_CARE_PROVIDER_SITE_OTHER): Payer: Medicare HMO | Admitting: Internal Medicine

## 2015-01-29 VITALS — BP 118/64 | HR 91 | Temp 97.4°F | Wt 194.0 lb

## 2015-01-29 DIAGNOSIS — R0689 Other abnormalities of breathing: Secondary | ICD-10-CM | POA: Diagnosis not present

## 2015-01-29 NOTE — Patient Instructions (Signed)
Follow up with Dr. Stevenson Clinch in 6 months - Will have [employer] get a download off your machine, and will let you know the results. - If you do, can see if [employer] can loan you a travel concentrator whenever you go out of town. - Check your blood pressure twice a day for 1 week and record the readings, inform your cardiologist if your blood pressure is running low - Continue with noninvasive ventilator at night and 2 L oxygen at night

## 2015-01-29 NOTE — Progress Notes (Signed)
MRN# [id number] Christian [name] [birth year]   CC: Chief Complaint  Patient presents with  . Follow-up    SOB at all times; uses a ?auto ventilator at night w/2L O2;      Brief History: 07/2014 HPI The patient comes in today for follow-up of his known chronic respiratory failure, secondary to hypoventilation. It is unclear whether he has central hypoventilation, or some type of neuromuscular disease. His family member tells me that he has siblings who are starting to develop similar issues. He has been on noninvasive positive pressure ventilation, and has done exceptionally well. He sleeps better, and has better oxygenation with recruitment. He has no issues with his mask fit, and feels that it is very comfortable. He is aggravated with having to use oxygen at night, because it interferes with travel. It is difficult for him to carry the large concentrator with him. Plan: The patient continues to do very well on his noninvasive positive pressure device. He feels that he sleeps adequately, and that it has helped his breathing as well. I would like for him to continue on his device, and we'll get a three-month download from his home care company. It remains unclear whether this is a neuromuscular disease or central hypoventilation, but the family member tells me that he has siblings who are now experiencing very similar issues. Will check overnight oximetry on room air to see if he still needs oxygen, and I suspect that he does. If so, we can hopefully make travel easier for him with a loaner travel concentrator.  Events since last clinic visit: Patient presents today for a follow-up visit of hypoventilation. Today he is accompanied by his daughter, Anderson [name] . Overall patient states that he is doing well, he has chronic shortness of breath at baseline, he is using a 4-point rolling walker, he does not have worsening shortness of breath since his last visit with pulmonary. He does have a muscle  weakness/dystrophy undiagnosed disorder, he has been followed extensively by neurology, has had no testing, and muscle testing done, however no diagnosis could be made from following with multiple specialists has to his muscle weakness. He uses a noninvasive ventilator at night secondary to hypoventilation, he has a history of congestive heart failure and atrial fibrillation. Anderson [name] states that she has noticed over the past 2-3 months his legs may have become a little weaker but his breathing has not change.    Medication:   Current Outpatient Rx  Name  Route  Sig  Dispense  Refill  . acetaminophen (TYLENOL) 500 MG tablet   Oral   Take 500 mg by mouth every 6 (six) hours as needed.         . dronedarone (MULTAQ) 400 MG tablet   Oral   Take 1 tablet (400 mg total) by mouth 2 (two) times daily with a meal.   180 tablet   3   . menthol-cetylpyridinium (CEPACOL) 3 MG lozenge   Oral   Take 1 lozenge by mouth as needed for sore throat.         . metoprolol tartrate (LOPRESSOR) 25 MG tablet      TAKE 1 TABLET BY MOUTH TWICE DAILY   60 tablet   11   . nitroGLYCERIN (NITROSTAT) 0.4 MG SL tablet   Sublingual   Place 0.4 mg under the tongue every 5 (five) minutes as needed.           . NON FORMULARY      Oxygen @ 2  liters at bedtime.         Marland Kitchen omeprazole (PRILOSEC) 20 MG capsule      TAKE 1 CAPSULE BY MOUTH EVERY DAY   90 capsule   3   . potassium chloride (K-DUR,KLOR-CON) 10 MEQ tablet   Oral   Take 10 mEq by mouth every other day.         . rivaroxaban (XARELTO) 20 MG TABS tablet   Oral   Take 1 tablet (20 mg total) by mouth daily.   90 tablet   3   . spironolactone (ALDACTONE) 25 MG tablet   Oral   Take 25 mg by mouth every other day.          . tamsulosin (FLOMAX) 0.4 MG CAPS capsule      TAKE 1 CAPSULE BY MOUTH EVERY EVENING AFTER SUPPER   90 capsule   1   . torsemide (DEMADEX) 20 MG tablet      TAKE 1 TABLET BY MOUTH EVERY OTHER DAY   15  tablet   3      Review of Systems: Gen:  Denies  fever, sweats, chills HEENT: Denies blurred vision, double vision, ear pain, eye pain, hearing loss, nose bleeds, sore throat Cvc:  No dizziness, chest pain or heaviness Resp:   Admits to: Chronic shortness of breath, not worse from baseline Gi: Denies swallowing difficulty, stomach pain, nausea or vomiting, diarrhea, constipation, bowel incontinence Gu:  Denies bladder incontinence, burning urine Ext:   No Joint pain, stiffness or swelling Skin: No skin rash, easy bruising or bleeding or hives Endoc:  No polyuria, polydipsia , polyphagia or weight change Other:  All other systems negative  Allergies:  Ambien; Crestor; Other; Pradaxa; and Tramadol  Physical Examination:  VS: BP 118/64 mmHg  Pulse 91  Temp(Src) 97.4 F (36.3 C) (Oral)  Wt 194 lb (87.998 kg)  SpO2 96%  General Appearance: No distress  HEENT: PERRLA, no ptosis, no other lesions noticed Pulmonary: Good respiratory effort, no use of intercostal muscles, mild fine crackles in the right base, no wheezing. Cardiovascular:  Normal S1,S2.  No m/r/g.     Abdomen:Exam: Benign, Soft, non-tender, No masses  Skin:   warm, no rashes, no ecchymosis  Extremities: normal, no cyanosis, clubbing, warm with normal capillary refill.    Assessment and Plan:  74 year old male with muscle weakness and hypoventilation seen for follow-up visit Hypoventilation syndrome The patient continues to do very well on his noninvasive positive pressure device. He feels that he sleeps adequately, and that it has helped his breathing as well.   As stated by Dr. Braulio Conte, I agree that he should continue this device.  We'll get a three-month download from his home care company.  It remains unclear whether this is a neuromuscular disease or central hypoventilation, but the family member tells me that he has siblings who are now experiencing very similar issues, patient is currently on 2 L supplemental  oxygen at night only. Given that all the males in his family have had some sort of breathing or cardiac problem before the age of 24 I have recommended that the patient's son and the patient may need to get genetic counseling. This can be done by her primary care physician, and Anderson [name] (patient's daughter) stated she would look into it.     Updated Medication List Outpatient Encounter Prescriptions as of 01/29/2015  Medication Sig  . acetaminophen (TYLENOL) 500 MG tablet Take 500 mg by mouth every 6 (six) hours as needed.  Marland Kitchen  dronedarone (MULTAQ) 400 MG tablet Take 1 tablet (400 mg total) by mouth 2 (two) times daily with a meal.  . menthol-cetylpyridinium (CEPACOL) 3 MG lozenge Take 1 lozenge by mouth as needed for sore throat.  . metoprolol tartrate (LOPRESSOR) 25 MG tablet TAKE 1 TABLET BY MOUTH TWICE DAILY  . nitroGLYCERIN (NITROSTAT) 0.4 MG SL tablet Place 0.4 mg under the tongue every 5 (five) minutes as needed.    . NON FORMULARY Oxygen @ 2 liters at bedtime.  Marland Kitchen omeprazole (PRILOSEC) 20 MG capsule TAKE 1 CAPSULE BY MOUTH EVERY DAY  . potassium chloride (K-DUR,KLOR-CON) 10 MEQ tablet Take 10 mEq by mouth every other day.  . rivaroxaban (XARELTO) 20 MG TABS tablet Take 1 tablet (20 mg total) by mouth daily.  Marland Kitchen spironolactone (ALDACTONE) 25 MG tablet Take 25 mg by mouth every other day.   . tamsulosin (FLOMAX) 0.4 MG CAPS capsule TAKE 1 CAPSULE BY MOUTH EVERY EVENING AFTER SUPPER  . torsemide (DEMADEX) 20 MG tablet TAKE 1 TABLET BY MOUTH EVERY OTHER DAY  . [DISCONTINUED] midodrine (PROAMATINE) 10 MG tablet TAKE 1 TABLET BY MOUTH THREE TIMES DAILY  . [DISCONTINUED] polyethylene glycol powder (GLYCOLAX/MIRALAX) powder Take 17 g by mouth daily.  . [DISCONTINUED] triamcinolone cream (KENALOG) 0.1 % Apply 1 application topically 2 (two) times daily.   No facility-administered encounter medications on file as of 01/29/2015.    Orders for this visit: No orders of the defined types were placed  in this encounter.    Thank  you for the visitation and for allowing  [facility] to assist in the care of your patient. Our recommendations are noted above.  Please contact us if we can be of further service.  Vilinda Boehringer, MD [facility] Office Number: [phone]

## 2015-01-29 NOTE — Assessment & Plan Note (Signed)
The patient continues to do very well on his noninvasive positive pressure device. He feels that he sleeps adequately, and that it has helped his breathing as well.   As stated by Dr. Braulio Conte, I agree that he should continue this device.  We'll get a three-month download from his home care company.  It remains unclear whether this is a neuromuscular disease or central hypoventilation, but the family member tells me that he has siblings who are now experiencing very similar issues, patient is currently on 2 L supplemental oxygen at night only. Given that all the males in his family have had some sort of breathing or cardiac problem before the age of 61 I have recommended that the patient's son and the patient may need to get genetic counseling. This can be done by her primary care physician, and Anderson [name] (patient's daughter) stated she would look into it.

## 2015-02-03 ENCOUNTER — Ambulatory Visit: Payer: Medicare HMO | Admitting: Internal Medicine

## 2015-02-03 ENCOUNTER — Ambulatory Visit: Payer: Medicare HMO | Admitting: Pulmonary Disease

## 2015-03-01 ENCOUNTER — Other Ambulatory Visit: Payer: Self-pay | Admitting: Family Medicine

## 2015-04-01 ENCOUNTER — Other Ambulatory Visit: Payer: Self-pay | Admitting: Family Medicine

## 2015-04-07 ENCOUNTER — Other Ambulatory Visit: Payer: Self-pay | Admitting: *Deleted

## 2015-04-07 ENCOUNTER — Telehealth: Payer: Self-pay | Admitting: *Deleted

## 2015-04-07 MED ORDER — DRONEDARONE HCL 400 MG PO TABS: 400.0000 mg | ORAL_TABLET | Freq: Two times a day (BID) | ORAL | Status: DC

## 2015-04-07 NOTE — Telephone Encounter (Signed)
Multaq #60 R#3 sent to local [facility] for refill.

## 2015-04-07 NOTE — Telephone Encounter (Signed)
°  1. Which medications need to be refilled? Dronedarone   2. Which pharmacy is medication to be sent to? [facility] on [address]   3. Do they need a 30 day or 90 day supply? 30   4. Would they like a call back once the medication has been sent to the pharmacy? No

## 2015-05-05 ENCOUNTER — Inpatient Hospital Stay: Payer: Medicare HMO

## 2015-05-05 ENCOUNTER — Inpatient Hospital Stay: Payer: Medicare HMO | Attending: Internal Medicine | Admitting: Oncology

## 2015-05-05 VITALS — BP 95/72 | HR 116 | Temp 98.6°F | Resp 18 | Wt 199.5 lb

## 2015-05-05 DIAGNOSIS — K219 Gastro-esophageal reflux disease without esophagitis: Secondary | ICD-10-CM | POA: Insufficient documentation

## 2015-05-05 DIAGNOSIS — R0609 Other forms of dyspnea: Secondary | ICD-10-CM | POA: Insufficient documentation

## 2015-05-05 DIAGNOSIS — M129 Arthropathy, unspecified: Secondary | ICD-10-CM | POA: Insufficient documentation

## 2015-05-05 DIAGNOSIS — D649 Anemia, unspecified: Secondary | ICD-10-CM

## 2015-05-05 DIAGNOSIS — I2510 Atherosclerotic heart disease of native coronary artery without angina pectoris: Secondary | ICD-10-CM | POA: Insufficient documentation

## 2015-05-05 DIAGNOSIS — M2550 Pain in unspecified joint: Secondary | ICD-10-CM | POA: Diagnosis not present

## 2015-05-05 DIAGNOSIS — D509 Iron deficiency anemia, unspecified: Secondary | ICD-10-CM | POA: Insufficient documentation

## 2015-05-05 DIAGNOSIS — Z79899 Other long term (current) drug therapy: Secondary | ICD-10-CM | POA: Diagnosis not present

## 2015-05-05 DIAGNOSIS — I4891 Unspecified atrial fibrillation: Secondary | ICD-10-CM

## 2015-05-05 DIAGNOSIS — E119 Type 2 diabetes mellitus without complications: Secondary | ICD-10-CM | POA: Diagnosis not present

## 2015-05-05 DIAGNOSIS — D500 Iron deficiency anemia secondary to blood loss (chronic): Secondary | ICD-10-CM

## 2015-05-05 DIAGNOSIS — I10 Essential (primary) hypertension: Secondary | ICD-10-CM | POA: Diagnosis not present

## 2015-05-05 DIAGNOSIS — E785 Hyperlipidemia, unspecified: Secondary | ICD-10-CM | POA: Diagnosis not present

## 2015-05-05 DIAGNOSIS — I5032 Chronic diastolic (congestive) heart failure: Secondary | ICD-10-CM | POA: Diagnosis not present

## 2015-05-05 DIAGNOSIS — Z87442 Personal history of urinary calculi: Secondary | ICD-10-CM | POA: Insufficient documentation

## 2015-05-05 DIAGNOSIS — N179 Acute kidney failure, unspecified: Secondary | ICD-10-CM | POA: Diagnosis not present

## 2015-05-05 DIAGNOSIS — Z87891 Personal history of nicotine dependence: Secondary | ICD-10-CM | POA: Insufficient documentation

## 2015-05-05 LAB — CBC WITH DIFFERENTIAL/PLATELET
BASOS PCT: 1 %
Basophils Absolute: 0 10*3/uL (ref 0–0.1)
Eosinophils Absolute: 0.1 10*3/uL (ref 0–0.7)
Eosinophils Relative: 2 %
HCT: 41.7 % (ref 40.0–52.0)
Hemoglobin: 13.1 g/dL (ref 13.0–18.0)
Lymphocytes Relative: 11 %
Lymphs Abs: 0.7 10*3/uL — ABNORMAL LOW (ref 1.0–3.6)
MCH: 27.6 pg (ref 26.0–34.0)
MCHC: 31.4 g/dL — AB (ref 32.0–36.0)
MCV: 87.7 fL (ref 80.0–100.0)
MONOS PCT: 10 %
Monocytes Absolute: 0.7 10*3/uL (ref 0.2–1.0)
NEUTROS ABS: 5 10*3/uL (ref 1.4–6.5)
Neutrophils Relative %: 76 %
Platelets: 239 10*3/uL (ref 150–440)
RBC: 4.75 MIL/uL (ref 4.40–5.90)
RDW: 14.2 % (ref 11.5–14.5)
WBC: 6.6 10*3/uL (ref 3.8–10.6)

## 2015-05-05 LAB — IRON AND TIBC
IRON: 49 ug/dL (ref 45–182)
Saturation Ratios: 20 % (ref 17.9–39.5)
TIBC: 244 ug/dL — ABNORMAL LOW (ref 250–450)
UIBC: 195 ug/dL

## 2015-05-05 LAB — FERRITIN: Ferritin: 527 ng/mL — ABNORMAL HIGH (ref 24–336)

## 2015-05-05 NOTE — Progress Notes (Signed)
This patient was followed by Dr. Ma Hillock and is now going to be seeing Dr. Grayland Ormond for his anemia.

## 2015-05-15 NOTE — Progress Notes (Signed)
[place]  [phone] [phone]) [phone]  ID: Christian [name] OB: 27-Apr-1941  MR#: [id number]  QIH#:[id number]  Patient Care Team: Tonia Ghent, MD as PCP - General (Family Medicine)  CHIEF COMPLAINT:  Chief Complaint  Patient presents with  . Anemia    INTERVAL HISTORY: Patient returns to clinic today for repeat laboratory work, further evaluation, and consideration of additional IV iron. He does not complain of weakness or fatigue today. He has no neurologic complaints. He denies any recent fevers. He continues to have chronic dyspnea on exertion which is unchanged. He denies any chest pain. He denies any nausea, vomiting, constipation, or diarrhea. He has no urinary complaints. Patient otherwise feels well and offers no further specific complaints.  REVIEW OF SYSTEMS:   Review of Systems  Constitutional: Negative for fever and malaise/fatigue.  Respiratory: Positive for shortness of breath. Negative for cough.   Cardiovascular: Negative.  Negative for chest pain.  Gastrointestinal: Negative.  Negative for blood in stool and melena.  Musculoskeletal: Positive for joint pain.  Neurological: Negative.  Negative for weakness.    As per HPI. Otherwise, a complete review of systems is negatve.  PAST MEDICAL HISTORY: Past Medical History  Diagnosis Date  . Coronary artery disease   . Diabetes mellitus   . Hyperlipidemia   . Hypertension   . GERD (gastroesophageal reflux disease)   . Arthritis   . Diastolic CHF, chronic   . Kidney stones   . Atrial fibrillation     DCCV 04/2010  . CO2 retention     during admission to [facility] 2013  . Acute renal failure   . Hepatic encephalopathy     2013  . Hypoventilation syndrome     probably neuromuscular disease-on NIPPV    PAST SURGICAL HISTORY: Past Surgical History  Procedure Laterality Date  . Coronary artery bypass graft  2009    x 3 @ [place]  . Cardiac catheterization  2009    showing patent  grafts with elevated pulmonary pressures  . Colonoscopy  2011    negative per report  . Iron transfusion      FAMILY HISTORY Family History  Problem Relation Age of Onset  . Heart disease Brother   . Arthritis Mother   . Hypertension Mother   . Heart disease Father   . Hyperlipidemia Father   . Stroke Father   . Diabetes Father   . Heart disease Sister   . Aneurysm Mother   . Heart failure Father        ADVANCED DIRECTIVES:    HEALTH MAINTENANCE: Social History  Substance Use Topics  . Smoking status: Former Smoker -- 1.50 packs/day for 30 years    Types: Cigarettes    Quit date: 07/11/1981  . Smokeless tobacco: Never Used  . Alcohol Use: No     Colonoscopy:  PAP:  Bone density:  Lipid panel:  Allergies  Allergen Reactions  . Ambien [Zolpidem Tartrate]     Over sedation  . Crestor [Rosuvastatin Calcium]     myalgias  . Other     Sedative or medications for sleep.  . Pradaxa [Dabigatran Etexilate Mesylate]     Inflammation in throat.  . Tramadol     Intolerant but not an allergy; sedation    Current Outpatient Prescriptions  Medication Sig Dispense Refill  . acetaminophen (TYLENOL) 500 MG tablet Take 500 mg by mouth every 6 (six) hours as needed.    . dronedarone (MULTAQ) 400 MG tablet Take  1 tablet (400 mg total) by mouth 2 (two) times daily with a meal. 60 tablet 3  . ipratropium (ATROVENT) 0.03 % nasal spray U 2 SPRAYS IEN 2-3 XD  5  . menthol-cetylpyridinium (CEPACOL) 3 MG lozenge Take 1 lozenge by mouth as needed for sore throat.    . metoprolol tartrate (LOPRESSOR) 25 MG tablet TAKE 1 TABLET BY MOUTH TWICE DAILY 60 tablet 11  . nitroGLYCERIN (NITROSTAT) 0.4 MG SL tablet Place 0.4 mg under the tongue every 5 (five) minutes as needed.      . NON FORMULARY Oxygen @ 2 liters at bedtime.    Marland Kitchen omeprazole (PRILOSEC) 20 MG capsule TAKE 1 CAPSULE BY MOUTH EVERY DAY 90 capsule 3  . potassium chloride (K-DUR,KLOR-CON) 10 MEQ tablet Take 10 mEq by mouth every  other day.    . rivaroxaban (XARELTO) 20 MG TABS tablet Take 1 tablet (20 mg total) by mouth daily. 90 tablet 3  . spironolactone (ALDACTONE) 25 MG tablet Take 25 mg by mouth every other day.     . tamsulosin (FLOMAX) 0.4 MG CAPS capsule TAKE 1 CAPSULE BY MOUTH EVERY EVENING AFTER SUPPER 90 capsule 1  . torsemide (DEMADEX) 20 MG tablet TAKE 1 TABLET BY MOUTH EVERY OTHER DAY 45 tablet 1  . potassium chloride (K-DUR,KLOR-CON) 10 MEQ tablet TAKE 1 TABLET BY MOUTH EVERY DAY AS NEEDED (Patient not taking: Reported on 05/05/2015) 90 tablet 0  . spironolactone (ALDACTONE) 25 MG tablet TAKE 1/2 TO 1 TABLET BY MOUTH EVERY DAY AS NEEDED (Patient not taking: Reported on 05/05/2015) 90 tablet 0  . [DISCONTINUED] dabigatran (PRADAXA) 150 MG CAPS Take 1 capsule (150 mg total) by mouth every 12 (twelve) hours. 60 capsule 6  . [DISCONTINUED] lisinopril (PRINIVIL,ZESTRIL) 10 MG tablet Take 1 tablet (10 mg total) by mouth daily. 30 tablet 3  . [DISCONTINUED] metFORMIN (GLUCOPHAGE) 500 MG tablet Take 1 tablet (500 mg total) by mouth 2 (two) times daily with a meal. Intolerant of >500mg  per day 180 tablet 3  . [DISCONTINUED] sotalol (BETAPACE) 120 MG tablet Take 1 tablet (120 mg total) by mouth 2 (two) times daily. 60 tablet 6   No current facility-administered medications for this visit.    OBJECTIVE: Filed Vitals:   05/05/15 1058  BP: 95/72  Pulse: 116  Temp: 98.6 F (37 C)  Resp: 18     Body mass index is 27.84 kg/(m^2).    ECOG FS:0 - Asymptomatic  General: Well-developed, well-nourished, no acute distress. Eyes: Pink conjunctiva, anicteric sclera. Lungs: Clear to auscultation bilaterally. Heart: Regular rate and rhythm. No rubs, murmurs, or gallops. Abdomen: Soft, nontender, nondistended. No organomegaly noted, normoactive bowel sounds. Musculoskeletal: No edema, cyanosis, or clubbing. Neuro: Alert, answering all questions appropriately. Cranial nerves grossly intact. Skin: No rashes or petechiae  noted. Psych: Normal affect.   LAB RESULTS:  Lab Results  Component Value Date   NA 138 10/31/2014   K 4.7 10/31/2014   CL 93* 10/31/2014   CO2 33* 10/31/2014   GLUCOSE 83 10/31/2014   BUN 20 10/31/2014   CREATININE 1.09 10/31/2014   CALCIUM 9.2 10/31/2014   PROT 6.5 10/31/2014   ALBUMIN 3.3* 10/31/2014   AST 19 10/31/2014   ALT 11 10/31/2014   ALKPHOS 89 10/31/2014   BILITOT 0.5 10/31/2014   GFRNONAA >60 10/06/2012   GFRAA >60 10/06/2012    Lab Results  Component Value Date   WBC 6.6 05/05/2015   NEUTROABS 5.0 05/05/2015   HGB 13.1 05/05/2015   HCT  41.7 05/05/2015   MCV 87.7 05/05/2015   PLT 239 05/05/2015     STUDIES: No results found.  ASSESSMENT: Iron deficiency anemia.  PLAN:    1. Iron deficiency anemia: Currently, patient's hemoglobin and iron stores are within normal limits. He last received IV iron in April 2016.  Previously, the remainder of his laboratory work was negative or within normal limits. Return to clinic in 3 months with repeat laboratory work and further evaluation.  Patient expressed understanding and was in agreement with this plan. He also understands that He can call clinic at any time with any questions, concerns, or complaints.    Lloyd Huger, MD   05/15/2015 12:59 PM

## 2015-05-18 ENCOUNTER — Ambulatory Visit (INDEPENDENT_AMBULATORY_CARE_PROVIDER_SITE_OTHER): Payer: Medicare HMO | Admitting: Family Medicine

## 2015-05-18 ENCOUNTER — Encounter: Payer: Self-pay | Admitting: Family Medicine

## 2015-05-18 VITALS — BP 100/66 | HR 80 | Temp 97.5°F | Wt 201.2 lb

## 2015-05-18 DIAGNOSIS — D509 Iron deficiency anemia, unspecified: Secondary | ICD-10-CM

## 2015-05-18 DIAGNOSIS — Z0000 Encounter for general adult medical examination without abnormal findings: Secondary | ICD-10-CM

## 2015-05-18 DIAGNOSIS — Z7189 Other specified counseling: Secondary | ICD-10-CM

## 2015-05-18 DIAGNOSIS — Z951 Presence of aortocoronary bypass graft: Secondary | ICD-10-CM

## 2015-05-18 DIAGNOSIS — E785 Hyperlipidemia, unspecified: Secondary | ICD-10-CM

## 2015-05-18 DIAGNOSIS — H5203 Hypermetropia, bilateral: Secondary | ICD-10-CM

## 2015-05-18 DIAGNOSIS — I10 Essential (primary) hypertension: Secondary | ICD-10-CM

## 2015-05-18 DIAGNOSIS — Z23 Encounter for immunization: Secondary | ICD-10-CM | POA: Diagnosis not present

## 2015-05-18 DIAGNOSIS — K30 Functional dyspepsia: Secondary | ICD-10-CM

## 2015-05-18 LAB — COMPREHENSIVE METABOLIC PANEL
ALT: 12 U/L (ref 0–53)
AST: 17 U/L (ref 0–37)
Albumin: 3.5 g/dL (ref 3.5–5.2)
Alkaline Phosphatase: 82 U/L (ref 39–117)
BUN: 24 mg/dL — AB (ref 6–23)
CHLORIDE: 96 meq/L (ref 96–112)
CO2: 40 mEq/L — ABNORMAL HIGH (ref 19–32)
CREATININE: 1.23 mg/dL (ref 0.40–1.50)
Calcium: 9.6 mg/dL (ref 8.4–10.5)
GFR: 61.17 mL/min (ref >60.00)
GLUCOSE: 89 mg/dL (ref 70–99)
Potassium: 4.2 mEq/L (ref 3.5–5.1)
SODIUM: 141 meq/L (ref 135–145)
TOTAL PROTEIN: 6.5 g/dL (ref 6.0–8.3)
Total Bilirubin: 0.6 mg/dL (ref 0.2–1.2)

## 2015-05-18 LAB — LIPID PANEL
CHOLESTEROL: 176 mg/dL (ref 0–200)
HDL: 51.7 mg/dL (ref >39.00)
LDL CALC: 112 mg/dL — AB (ref 0–99)
NONHDL: 124.52
Total CHOL/HDL Ratio: 3
Triglycerides: 63 mg/dL (ref 0.0–149.0)
VLDL: 12.6 mg/dL (ref 0.0–40.0)

## 2015-05-18 LAB — LIPASE: LIPASE: 25 U/L (ref 11.0–59.0)

## 2015-05-18 NOTE — Patient Instructions (Addendum)
Think about getting the tetanus shot. The shingles shot is worth considering.   Christian [name] will call about your referral. Go to the lab on the way out.  We'll contact you with your lab report. Take care.  Glad to see you.

## 2015-05-18 NOTE — Progress Notes (Signed)
Pre visit review using our clinic review tool, if applicable. No additional management support is needed unless otherwise documented below in the visit note.  I have personally reviewed the Medicare Annual Wellness questionnaire and have noted 1. The patient's medical and social history 2. Their use of alcohol, tobacco or illicit drugs 3. Their current medications and supplements 4. The patient's functional ability including ADL's, fall risks, home safety risks and hearing or visual             impairment. 5. Diet and physical activities 6. Evidence for depression or mood disorders  The patients weight, height, BMI have been recorded in the chart and visual acuity is per eye clinic.  I have made referrals, counseling and provided education to the patient based review of the above and I have provided the pt with a written personalized care plan for preventive services.  Provider list updated- see scanned forms.  Routine anticipatory guidance given to patient.  See health maintenance.  Flu 2016 Shingles d/w. pt.  PNA d/w pt.  Tetanus ~10 years ago, possibly 8-9 years ago per patient.  Deferred for now.  Colonoscopy declined- d/w pt.  Likely not work the risk of procedure at this point given his trouble with endoscopy prev and his comorbid conditions.  Prostate cancer screening and PSA options (with potential risks and benefits of testing vs not testing) were discussed along with recent recs/guidelines.  He declined testing PSA at this point. Advance directive- daughter and son designated if patient were incapacitated.  Cognitive function addressed- see scanned forms- and if abnormal then additional documentation follows.   Needs referral to eye clinic.    Anemia.  Followed by heme.  Recently high enough not to need iron treatment.  No bleeding known.   Occ low BP with standing.  Longstanding, gets better after being up for a few seconds.  No syncope.  No vertigo sx.  Due for labs.  Has f/u  with cards pending.  No ADE on med.  Taking diuretics as needed for swelling, not daily.  No ADE on meds o/w.  Sleeping on 1 pillow at night.  BLE edema is usually symmetric.   He has the sensation of slow emptying of his stomach.  Capacity isn't decreased- he can still eat a full meal. He doesn't have pain per se, but a feeling of delayed gastric emptying.  He isn't losing weight.  No blood in stool.  No vomiting.  Known HH.  Sx are better with dec in soda intake.    PMH and SH reviewed  Meds, vitals, and allergies reviewed.   ROS: See HPI.  Otherwise negative.    GEN: nad, alert and oriented HEENT: mucous membranes moist NECK: supple w/o LA CV: IRR PULM: ctab, no inc wob ABD: soft, +bs EXT: 2+ BLE edema SKIN: no acute rash

## 2015-05-21 DIAGNOSIS — K30 Functional dyspepsia: Secondary | ICD-10-CM | POA: Insufficient documentation

## 2015-05-21 DIAGNOSIS — Z0000 Encounter for general adult medical examination without abnormal findings: Secondary | ICD-10-CM | POA: Insufficient documentation

## 2015-05-21 DIAGNOSIS — Z7189 Other specified counseling: Secondary | ICD-10-CM | POA: Insufficient documentation

## 2015-05-21 NOTE — Assessment & Plan Note (Signed)
Not definite, but he has the sensation thereof.  D/w pt.  Wouldn't likely need w/u at this point- he isn't having red flag sx and w/u wouldn't change the plan (even if he could tolerate EGD) as the meds for such would likely have risk>>benefit.  Would check basic labs today and he'll update me as needed.

## 2015-05-21 NOTE — Assessment & Plan Note (Signed)
Flu 2016 Shingles d/w. pt.  PNA d/w pt.  Tetanus ~10 years ago, possibly 8-9 years ago per patient.  Deferred for now.  Colonoscopy declined- d/w pt.  Likely not work the risk of procedure at this point given his trouble with endoscopy prev and his comorbid conditions.  Prostate cancer screening and PSA options (with potential risks and benefits of testing vs not testing) were discussed along with recent recs/guidelines.  He declined testing PSA at this point. Advance directive- daughter and son designated if patient were incapacitated.  Cognitive function addressed- see scanned forms- and if abnormal then additional documentation follows.   Needs referral to eye clinic.

## 2015-05-21 NOTE — Assessment & Plan Note (Signed)
Per hematology 

## 2015-05-21 NOTE — Assessment & Plan Note (Signed)
Hx of, with orthostatic sx.  I'll defer to cards and I didn't change his meds in the meantime.  Patient agrees.

## 2015-05-29 ENCOUNTER — Encounter: Payer: Self-pay | Admitting: Cardiovascular Disease

## 2015-05-29 ENCOUNTER — Ambulatory Visit (INDEPENDENT_AMBULATORY_CARE_PROVIDER_SITE_OTHER): Payer: Medicare HMO | Admitting: Cardiovascular Disease

## 2015-05-29 ENCOUNTER — Telehealth: Payer: Self-pay

## 2015-05-29 VITALS — BP 110/62 | HR 97 | Ht 71.0 in | Wt 200.0 lb

## 2015-05-29 DIAGNOSIS — I10 Essential (primary) hypertension: Secondary | ICD-10-CM

## 2015-05-29 DIAGNOSIS — I5031 Acute diastolic (congestive) heart failure: Secondary | ICD-10-CM | POA: Diagnosis not present

## 2015-05-29 DIAGNOSIS — I4891 Unspecified atrial fibrillation: Secondary | ICD-10-CM | POA: Diagnosis not present

## 2015-05-29 DIAGNOSIS — R600 Localized edema: Secondary | ICD-10-CM

## 2015-05-29 NOTE — Assessment & Plan Note (Signed)
Blood pressure is well controlled on today's visit. No changes made to the medications. 

## 2015-05-29 NOTE — Patient Instructions (Addendum)
Weight is up 10 pounds  Please take torsemide daily for 4 days Then take torsemide 4 days a week instead of every other day  Take torsemide with potassium  Compression hose with a zipper daily   Please monitor the weight, Call the office if weight continues to trend upwards  Please call us if you have new issues that need to be addressed before your next appt.  Your physician wants you to follow-up in: 6 months.  You will receive a reminder letter in the mail two months in advance. If you don't receive a letter, please call our office to schedule the follow-up appointment.

## 2015-05-29 NOTE — Assessment & Plan Note (Signed)
Recommended he take torsemide 4 days in a row then 4 days per week, up from 3 days per week with potassium  Suspect leg edema is more from not wearing his compression hose In the past, leg edema out of proportion to his CHF

## 2015-05-29 NOTE — Telephone Encounter (Signed)
Discuss this with them on visit today, they can take a supplement

## 2015-05-29 NOTE — Telephone Encounter (Signed)
Pt daughter called, has a question, wants to know if pt can take a joint supplement. Please call and advise.

## 2015-05-29 NOTE — Assessment & Plan Note (Signed)
Heart rate mildly elevated but no medication changes made at this time. We'll continue metoprolol, anticoagulation

## 2015-05-29 NOTE — Telephone Encounter (Signed)
Left message on Christian [name]'s vm w/ Dr. Donivan Scull recommendation.  Asked her to call back w/ any questions or concerns.

## 2015-05-29 NOTE — Assessment & Plan Note (Signed)
Worsening leg swelling, likely lymphedema/venous insufficiency Has not been wearing his compression hose Also legs down as recliner was not working Unable to exclude component of acute on chronic diastolic CHF Recommended he take extra torsemide in the next 4 days then increase torsemide up to 4 days per week, up from 3  Talk to his daughter, she will order compression hose with a zipper. Stress importance of raising his legs in the recliner

## 2015-05-29 NOTE — Progress Notes (Signed)
Patient ID: Christian [name], male    DOB: Jun 06, 1941, 75 y.o.   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 He has chronic atrial fibrillation. He presents for follow-up of his atrial fibrillation, chronic diastolic CHF  In follow-up today, weight is up 10 pounds Daughter presents with him today and reports that his energy is fantastic, has not been better the past several weeks. He's not been wearing his compression hose. Reports he is gaining weight around his abdomen from more eating Problems with his recliner recently, had his legs down He denies any significant shortness of breath or cough Continues to use his BiPAP He is been taking torsemide every other day  EKG on today's visit shows atrial fibrillation with ventricular rate 97 bpm, no significant ST or T-wave changes  Compliant with his BiPAP History of chronic fatigue No regular exercise program Previously was taking midodrine, currently this is not on his medication list. He was taking this in the morning Seeing a chiropractor which is helping his chronic back and arm pain, neck pain. Family is happy with progress there.  Other past medical history lab work  01/15/2014. CBC, BMP all normal, hemoglobin A1c well controlled  Remote h/o profound esophagitis, weight loss of more than 50 pounds, anorexia, lack of interest, food getting stuck in his throat, difficulty swallowing dry mouth, periods of hallucinations and memory problems.  hospital at [facility] September 02 2011 following an EGD that showed severe esophagitis, moderate gastritis,.  cardioversion  for atrial fibrillation  which was successful though in followup he converted back to atrial fibrillation. asymptomatic in June 2013 when seen in clinic while in atrial fibrillation .  His biggest complaint is that he has dizziness after he eats Several prior admissions to the hospital for failure to thrive, leg weakness, mental status changes, hypoxia, hypercapnic respiratory distress.   Sees Dr. Gwenette Greet of pulmonary,doing well on a noninvasive ventilator . No recent hospital admissions for hypercapnia and confusion.   Previous  Echocardiogram showed normal LV systolic function greater than 55%, borderline elevated right ventricular systolic pressures pulmonic aortic valve stenosis. an  Allergies  Allergen Reactions  . Ambien [Zolpidem Tartrate]     Over sedation  . Crestor [Rosuvastatin Calcium]     myalgias  . Other     Sedative or medications for sleep.  . Pradaxa [Dabigatran Etexilate Mesylate]     Inflammation in throat.  . Tramadol     Intolerant but not an allergy; sedation    Current Outpatient Prescriptions on File Prior to Visit  Medication Sig Dispense Refill  . acetaminophen (TYLENOL) 500 MG tablet Take 500 mg by mouth every 6 (six) hours as needed.    . dronedarone (MULTAQ) 400 MG tablet Take 1 tablet (400 mg total) by mouth 2 (two) times daily with a meal. 60 tablet 3  . ipratropium (ATROVENT) 0.03 % nasal spray U 2 SPRAYS IEN 2-3 XD  5  . menthol-cetylpyridinium (CEPACOL) 3 MG lozenge Take 1 lozenge by mouth as needed for sore throat.    . metoprolol tartrate (LOPRESSOR) 25 MG tablet TAKE 1 TABLET BY MOUTH TWICE DAILY 60 tablet 11  . Multiple Vitamins-Minerals (CENTRUM SILVER  PO) Take by mouth daily.    . nitroGLYCERIN (NITROSTAT) 0.4 MG SL tablet Place 0.4 mg under the tongue every 5 (five) minutes as needed.      . NON FORMULARY Oxygen @ 2 liters at bedtime.    Marland Kitchen omeprazole (PRILOSEC) 20 MG capsule TAKE 1 CAPSULE BY MOUTH EVERY DAY 90 capsule 3  . potassium chloride (K-DUR,KLOR-CON) 10 MEQ  tablet TAKE 1 TABLET BY MOUTH EVERY DAY AS NEEDED 90 tablet 0  . rivaroxaban (XARELTO) 20 MG TABS tablet Take 1 tablet (20 mg total) by mouth daily. 90 tablet 3  . spironolactone (ALDACTONE) 25 MG tablet Take 25 mg by mouth every other day.     . tamsulosin (FLOMAX) 0.4 MG CAPS capsule TAKE 1 CAPSULE BY MOUTH EVERY EVENING AFTER SUPPER 90 capsule 1  . torsemide (DEMADEX) 20 MG tablet TAKE 1 TABLET BY MOUTH EVERY OTHER DAY 45 tablet 1  . [DISCONTINUED] dabigatran (PRADAXA) 150 MG CAPS Take 1 capsule (150 mg total) by mouth every 12 (twelve) hours. 60 capsule 6  . [DISCONTINUED] lisinopril (PRINIVIL,ZESTRIL) 10 MG tablet Take 1 tablet (10 mg total) by mouth daily. 30 tablet 3  . [DISCONTINUED] metFORMIN (GLUCOPHAGE) 500 MG tablet Take 1 tablet (500 mg total) by mouth 2 (two) times daily with a meal. Intolerant of >500mg  per day 180 tablet 3  . [DISCONTINUED] sotalol (BETAPACE) 120 MG tablet Take 1 tablet (120 mg total) by mouth 2 (two) times daily. 60 tablet 6   No current facility-administered medications on file prior to visit.    Past Medical History  Diagnosis Date  . Coronary artery disease   . Diabetes mellitus   . Hyperlipidemia   . Hypertension   . GERD (gastroesophageal reflux disease)   . Arthritis   . Diastolic CHF, chronic ([place])   . Kidney stones   . Atrial fibrillation ([place])     DCCV 04/2010  . CO2 retention     during admission to [facility] 2013  . Acute renal failure ([place])   . Hepatic encephalopathy ([place])     2013  . 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 Coronary artery bypass graft  2009    x 3 @ [place]  . Cardiac catheterization  2009    showing patent grafts with elevated pulmonary pressures  . Colonoscopy  2011    negative per report  . Iron transfusion      Social History  reports that he quit smoking about 33 years ago. His smoking use included Cigarettes. He has a 45  pack-year smoking history. He has never used smokeless tobacco. He reports that he does not drink alcohol or use illicit drugs.  Family History family history includes Aneurysm in his mother; Arthritis in his mother; Diabetes in his father; Heart disease in his brother, brother, father, and sister; Heart failure in his father; Hyperlipidemia in his father; Hypertension in his mother; Stroke in his father. There is no history of Colon cancer or Prostate cancer.  Review of Systems  Constitutional: Positive for fatigue.  HENT: Negative.   Eyes: Negative.   Respiratory: Negative.   Cardiovascular: Positive for leg swelling.  Gastrointestinal: Negative.   Musculoskeletal: Positive for gait problem.  Neurological: Positive for weakness.  Hematological: Negative.   Psychiatric/Behavioral: Negative.   All other systems reviewed and are negative.   BP 110/62 mmHg  Pulse 97  Ht 5\' 11"  (1.803 m)  Wt 200 lb (90.719 kg)  BMI 27.91 kg/m2  Physical Exam  Constitutional: He is oriented to person, place, and time. He appears well-developed and well-nourished.  HENT:  Head: Normocephalic.  Nose: Nose normal.  Mouth/Throat: Oropharynx is clear and moist.  Eyes: Conjunctivae are normal. Pupils are equal, round, and reactive to light.  Neck: Normal range of motion. Neck supple. No JVD present.  Cardiovascular: Normal rate, S1 normal, S2 normal and intact distal pulses.  An irregularly irregular rhythm present. Exam reveals no gallop and no friction rub.   Murmur heard.  Systolic murmur is present with a grade of 2/6  Trace to 1+ pitting edema to below the knees, compression hose in place  Pulmonary/Chest: Effort normal and breath sounds normal. No respiratory distress. He has no wheezes. He has no rales. He exhibits no tenderness.  Abdominal: Soft. Bowel sounds are normal. He exhibits no distension. There is no tenderness.  Musculoskeletal: Normal range of motion. He exhibits edema. He exhibits no  tenderness.  Lymphadenopathy:    He has no cervical adenopathy.  Neurological: He is alert and oriented to person, place, and time. Coordination normal.  Skin: Skin is warm and dry. No rash noted. No erythema.  Psychiatric: He has a normal mood and affect. His behavior is normal. Judgment and thought content normal.      Assessment and Plan   Nursing note and vitals reviewed.

## 2015-07-08 ENCOUNTER — Other Ambulatory Visit: Payer: Self-pay | Admitting: Family Medicine

## 2015-07-08 LAB — HM DIABETES EYE EXAM

## 2015-07-10 LAB — HM DIABETES EYE EXAM

## 2015-07-30 ENCOUNTER — Encounter: Payer: Self-pay | Admitting: Family Medicine

## 2015-08-05 ENCOUNTER — Inpatient Hospital Stay: Payer: Medicare HMO | Attending: Oncology

## 2015-08-05 ENCOUNTER — Inpatient Hospital Stay (HOSPITAL_BASED_OUTPATIENT_CLINIC_OR_DEPARTMENT_OTHER): Payer: Medicare HMO | Admitting: Oncology

## 2015-08-05 ENCOUNTER — Inpatient Hospital Stay: Payer: Medicare HMO

## 2015-08-05 VITALS — BP 101/68 | HR 116 | Temp 97.2°F | Resp 16 | Wt 204.4 lb

## 2015-08-05 DIAGNOSIS — K219 Gastro-esophageal reflux disease without esophagitis: Secondary | ICD-10-CM | POA: Insufficient documentation

## 2015-08-05 DIAGNOSIS — I4891 Unspecified atrial fibrillation: Secondary | ICD-10-CM | POA: Diagnosis not present

## 2015-08-05 DIAGNOSIS — D509 Iron deficiency anemia, unspecified: Secondary | ICD-10-CM | POA: Insufficient documentation

## 2015-08-05 DIAGNOSIS — I5032 Chronic diastolic (congestive) heart failure: Secondary | ICD-10-CM

## 2015-08-05 DIAGNOSIS — E119 Type 2 diabetes mellitus without complications: Secondary | ICD-10-CM

## 2015-08-05 DIAGNOSIS — I10 Essential (primary) hypertension: Secondary | ICD-10-CM

## 2015-08-05 DIAGNOSIS — N179 Acute kidney failure, unspecified: Secondary | ICD-10-CM | POA: Insufficient documentation

## 2015-08-05 DIAGNOSIS — Z87891 Personal history of nicotine dependence: Secondary | ICD-10-CM | POA: Insufficient documentation

## 2015-08-05 DIAGNOSIS — I2510 Atherosclerotic heart disease of native coronary artery without angina pectoris: Secondary | ICD-10-CM | POA: Insufficient documentation

## 2015-08-05 DIAGNOSIS — E785 Hyperlipidemia, unspecified: Secondary | ICD-10-CM | POA: Insufficient documentation

## 2015-08-05 DIAGNOSIS — M129 Arthropathy, unspecified: Secondary | ICD-10-CM

## 2015-08-05 DIAGNOSIS — Z79899 Other long term (current) drug therapy: Secondary | ICD-10-CM

## 2015-08-05 DIAGNOSIS — R0602 Shortness of breath: Secondary | ICD-10-CM | POA: Insufficient documentation

## 2015-08-05 LAB — CBC WITH DIFFERENTIAL/PLATELET
Basophils Absolute: 0 10*3/uL (ref 0–0.1)
Basophils Relative: 1 %
EOS PCT: 1 %
Eosinophils Absolute: 0.1 10*3/uL (ref 0–0.7)
HEMATOCRIT: 40.6 % (ref 40.0–52.0)
Hemoglobin: 13 g/dL (ref 13.0–18.0)
LYMPHS ABS: 0.6 10*3/uL — AB (ref 1.0–3.6)
LYMPHS PCT: 10 %
MCH: 28.2 pg (ref 26.0–34.0)
MCHC: 32.1 g/dL (ref 32.0–36.0)
MCV: 87.7 fL (ref 80.0–100.0)
MONO ABS: 0.6 10*3/uL (ref 0.2–1.0)
MONOS PCT: 10 %
NEUTROS ABS: 4.9 10*3/uL (ref 1.4–6.5)
Neutrophils Relative %: 78 %
PLATELETS: 216 10*3/uL (ref 150–440)
RBC: 4.63 MIL/uL (ref 4.40–5.90)
RDW: 14.9 % — AB (ref 11.5–14.5)
WBC: 6.3 10*3/uL (ref 3.8–10.6)

## 2015-08-05 LAB — FERRITIN: FERRITIN: 492 ng/mL — AB (ref 24–336)

## 2015-08-05 LAB — IRON AND TIBC
IRON: 39 ug/dL — AB (ref 45–182)
Saturation Ratios: 15 % — ABNORMAL LOW (ref 17.9–39.5)
TIBC: 265 ug/dL (ref 250–450)
UIBC: 226 ug/dL

## 2015-08-05 NOTE — Progress Notes (Signed)
[place]  [phone]) [phone] [phone]) [phone]  ID: Christian [name] OB: March 09, 1941  MR#: [id number]  [id number]  Patient Care Team: Tonia Ghent, MD as PCP - General (Family Medicine)  CHIEF COMPLAINT:  Chief Complaint  Patient presents with  . Anemia    INTERVAL HISTORY: Patient returns to clinic today for repeat laboratory work, further evaluation, and consideration of additional IV iron. He does not complain of weakness or fatigue today. He has no neurologic complaints. He denies any recent fevers. He continues to have chronic dyspnea on exertion which is unchanged. He denies any chest pain. He denies any nausea, vomiting, constipation, or diarrhea. He has no urinary complaints. Patient otherwise feels well and offers no further specific complaints.  REVIEW OF SYSTEMS:   Review of Systems  Constitutional: Negative for fever and malaise/fatigue.  Respiratory: Positive for shortness of breath. Negative for cough.   Cardiovascular: Negative.  Negative for chest pain.  Gastrointestinal: Negative.  Negative for blood in stool and melena.  Musculoskeletal: Negative.   Neurological: Negative.  Negative for weakness.    As per HPI. Otherwise, a complete review of systems is negatve.  PAST MEDICAL HISTORY: Past Medical History  Diagnosis Date  . Coronary artery disease   . Diabetes mellitus   . Hyperlipidemia   . Hypertension   . GERD (gastroesophageal reflux disease)   . Arthritis   . Diastolic CHF, chronic ([place])   . Kidney stones   . Atrial fibrillation ([place])     DCCV 04/2010  . CO2 retention     during admission to [facility] 2013  . Acute renal failure ([place])   . Hepatic encephalopathy ([place])     2013  . Hypoventilation syndrome     probably neuromuscular disease-on NIPPV    PAST SURGICAL HISTORY: Past Surgical History  Procedure Laterality Date  . Coronary artery bypass graft  2009    x 3 @ [place]  . Cardiac catheterization  2009   showing patent grafts with elevated pulmonary pressures  . Colonoscopy  2011    negative per report  . Iron transfusion      FAMILY HISTORY Family History  Problem Relation Age of Onset  . Heart disease Brother   . Arthritis Mother   . Hypertension Mother   . Aneurysm Mother   . Heart disease Father   . Hyperlipidemia Father   . Stroke Father   . Diabetes Father   . Heart failure Father   . Heart disease Sister   . Colon cancer Neg Hx   . Prostate cancer Neg Hx   . Heart disease Brother        ADVANCED DIRECTIVES:    HEALTH MAINTENANCE: Social History  Substance Use Topics  . Smoking status: Former Smoker -- 1.50 packs/day for 30 years    Types: Cigarettes    Quit date: 07/11/1981  . Smokeless tobacco: Never Used  . Alcohol Use: No     Colonoscopy:  PAP:  Bone density:  Lipid panel:  Allergies  Allergen Reactions  . Ambien [Zolpidem Tartrate]     Over sedation  . Crestor [Rosuvastatin Calcium]     myalgias  . Other     Sedative or medications for sleep.  . Pradaxa [Dabigatran Etexilate Mesylate]     Inflammation in throat.  . Tramadol     Intolerant but not an allergy; sedation    Current Outpatient Prescriptions  Medication Sig Dispense Refill  . acetaminophen (TYLENOL) 500 MG tablet Take  500 mg by mouth every 6 (six) hours as needed.    . dronedarone (MULTAQ) 400 MG tablet Take 1 tablet (400 mg total) by mouth 2 (two) times daily with a meal. 60 tablet 3  . ipratropium (ATROVENT) 0.03 % nasal spray U 2 SPRAYS IEN 2-3 XD  5  . menthol-cetylpyridinium (CEPACOL) 3 MG lozenge Take 1 lozenge by mouth as needed for sore throat.    . metoprolol tartrate (LOPRESSOR) 25 MG tablet TAKE 1 TABLET BY MOUTH TWICE DAILY 60 tablet 11  . Multiple Vitamins-Minerals (CENTRUM SILVER PO) Take by mouth daily.    . nitroGLYCERIN (NITROSTAT) 0.4 MG SL tablet Place 0.4 mg under the tongue every 5 (five) minutes as needed.      . NON FORMULARY Oxygen @ 2 liters at bedtime.     Marland Kitchen omeprazole (PRILOSEC) 20 MG capsule TAKE 1 CAPSULE BY MOUTH EVERY DAY 90 capsule 3  . potassium chloride (K-DUR,KLOR-CON) 10 MEQ tablet TAKE 1 TABLET BY MOUTH EVERY DAY AS NEEDED 90 tablet 0  . rivaroxaban (XARELTO) 20 MG TABS tablet Take 1 tablet (20 mg total) by mouth daily. 90 tablet 3  . spironolactone (ALDACTONE) 25 MG tablet Take 25 mg by mouth every other day.     . tamsulosin (FLOMAX) 0.4 MG CAPS capsule TAKE 1 CAPSULE BY MOUTH EVERY EVENING AFTER DINNER 90 capsule 2  . torsemide (DEMADEX) 20 MG tablet TAKE 1 TABLET BY MOUTH EVERY OTHER DAY 45 tablet 1  . [DISCONTINUED] dabigatran (PRADAXA) 150 MG CAPS Take 1 capsule (150 mg total) by mouth every 12 (twelve) hours. 60 capsule 6  . [DISCONTINUED] lisinopril (PRINIVIL,ZESTRIL) 10 MG tablet Take 1 tablet (10 mg total) by mouth daily. 30 tablet 3  . [DISCONTINUED] metFORMIN (GLUCOPHAGE) 500 MG tablet Take 1 tablet (500 mg total) by mouth 2 (two) times daily with a meal. Intolerant of >500mg  per day 180 tablet 3  . [DISCONTINUED] sotalol (BETAPACE) 120 MG tablet Take 1 tablet (120 mg total) by mouth 2 (two) times daily. 60 tablet 6   No current facility-administered medications for this visit.    OBJECTIVE: Filed Vitals:   08/05/15 1036  BP: 101/68  Pulse: 116  Temp: 97.2 F (36.2 C)  Resp: 16     Body mass index is 28.52 kg/(m^2).    ECOG FS:0 - Asymptomatic  General: Well-developed, well-nourished, no acute distress. Eyes: Pink conjunctiva, anicteric sclera. Lungs: Clear to auscultation bilaterally. Heart: Regular rate and rhythm. No rubs, murmurs, or gallops. Abdomen: Soft, nontender, nondistended. No organomegaly noted, normoactive bowel sounds. Musculoskeletal: No edema, cyanosis, or clubbing. Neuro: Alert, answering all questions appropriately. Cranial nerves grossly intact. Skin: No rashes or petechiae noted. Psych: Normal affect.   LAB RESULTS:  Lab Results  Component Value Date   NA 141 05/18/2015   K 4.2  05/18/2015   CL 96 05/18/2015   CO2 40* 05/18/2015   GLUCOSE 89 05/18/2015   BUN 24* 05/18/2015   CREATININE 1.23 05/18/2015   CALCIUM 9.6 05/18/2015   PROT 6.5 05/18/2015   ALBUMIN 3.5 05/18/2015   AST 17 05/18/2015   ALT 12 05/18/2015   ALKPHOS 82 05/18/2015   BILITOT 0.6 05/18/2015   GFRNONAA >60 10/06/2012   GFRAA >60 10/06/2012    Lab Results  Component Value Date   WBC 6.3 08/05/2015   NEUTROABS 4.9 08/05/2015   HGB 13.0 08/05/2015   HCT 40.6 08/05/2015   MCV 87.7 08/05/2015   PLT 216 08/05/2015     STUDIES: No  results found.  ASSESSMENT: Iron deficiency anemia.  PLAN:    1. Iron deficiency anemia: Currently, patient's hemoglobin and iron stores are within normal limits. He last received IV iron in April 2016.  Previously, the remainder of his laboratory work was negative or within normal limits. Return to clinic in 3 months with repeat laboratory work and further evaluation.  Patient expressed understanding and was in agreement with this plan. He also understands that He can call clinic at any time with any questions, concerns, or complaints.    Mayra Reel, NP   08/05/2015 11:30 AM   Patient was seen and evaluated independently and I agree with the assessment and plan as dictated above.  Lloyd Huger, MD 08/05/2015 2:37 PM

## 2015-08-05 NOTE — Progress Notes (Signed)
Patient is having CHF exacerbation and is being followed by cardiologist.

## 2015-08-09 ENCOUNTER — Encounter: Payer: Self-pay | Admitting: Cardiovascular Disease

## 2015-08-09 ENCOUNTER — Other Ambulatory Visit: Payer: Self-pay | Admitting: Cardiovascular Disease

## 2015-08-10 ENCOUNTER — Other Ambulatory Visit: Payer: Self-pay

## 2015-08-10 MED ORDER — RIVAROXABAN 20 MG PO TABS: 20.0000 mg | ORAL_TABLET | Freq: Every day | ORAL | Status: AC

## 2015-08-23 ENCOUNTER — Other Ambulatory Visit: Payer: Self-pay | Admitting: Family Medicine

## 2015-08-24 NOTE — Telephone Encounter (Signed)
Electronic refill request. Last Filled:   ? Quantity on 01/31/2013.  Medicare wellness in November 2016.  Please advise.

## 2015-08-25 NOTE — Telephone Encounter (Signed)
Sent. Thanks.   

## 2015-09-06 ENCOUNTER — Other Ambulatory Visit: Payer: Self-pay | Admitting: Family Medicine

## 2015-09-07 NOTE — Telephone Encounter (Signed)
Electronic refill request. Last Filled:   30 g on 09/18/14.  Last office visit:   05/18/15  For CPE.  Please advise.

## 2015-09-08 NOTE — Telephone Encounter (Signed)
Sent. Thanks.   

## 2015-10-21 ENCOUNTER — Encounter: Payer: Self-pay | Admitting: Medical Oncology

## 2015-10-21 ENCOUNTER — Emergency Department
Admission: EM | Admit: 2015-10-21 | Discharge: 2015-10-21 | Disposition: A | Discharge status: Home / Self Care | Payer: Medicare HMO | Source: Home / Self Care | Attending: Emergency Medicine | Admitting: Emergency Medicine

## 2015-10-21 ENCOUNTER — Emergency Department: Payer: Medicare HMO

## 2015-10-21 DIAGNOSIS — M1990 Unspecified osteoarthritis, unspecified site: Secondary | ICD-10-CM

## 2015-10-21 DIAGNOSIS — Z885 Allergy status to narcotic agent status: Secondary | ICD-10-CM | POA: Insufficient documentation

## 2015-10-21 DIAGNOSIS — Z951 Presence of aortocoronary bypass graft: Secondary | ICD-10-CM

## 2015-10-21 DIAGNOSIS — I110 Hypertensive heart disease with heart failure: Secondary | ICD-10-CM | POA: Insufficient documentation

## 2015-10-21 DIAGNOSIS — E785 Hyperlipidemia, unspecified: Secondary | ICD-10-CM | POA: Insufficient documentation

## 2015-10-21 DIAGNOSIS — Z87891 Personal history of nicotine dependence: Secondary | ICD-10-CM

## 2015-10-21 DIAGNOSIS — A419 Sepsis, unspecified organism: Secondary | ICD-10-CM | POA: Diagnosis not present

## 2015-10-21 DIAGNOSIS — Z79899 Other long term (current) drug therapy: Secondary | ICD-10-CM

## 2015-10-21 DIAGNOSIS — I2510 Atherosclerotic heart disease of native coronary artery without angina pectoris: Secondary | ICD-10-CM

## 2015-10-21 DIAGNOSIS — M542 Cervicalgia: Secondary | ICD-10-CM | POA: Insufficient documentation

## 2015-10-21 DIAGNOSIS — M25512 Pain in left shoulder: Secondary | ICD-10-CM

## 2015-10-21 DIAGNOSIS — J189 Pneumonia, unspecified organism: Secondary | ICD-10-CM | POA: Diagnosis not present

## 2015-10-21 DIAGNOSIS — J449 Chronic obstructive pulmonary disease, unspecified: Secondary | ICD-10-CM

## 2015-10-21 DIAGNOSIS — E1165 Type 2 diabetes mellitus with hyperglycemia: Secondary | ICD-10-CM | POA: Insufficient documentation

## 2015-10-21 DIAGNOSIS — Z888 Allergy status to other drugs, medicaments and biological substances status: Secondary | ICD-10-CM

## 2015-10-21 DIAGNOSIS — J9610 Chronic respiratory failure, unspecified whether with hypoxia or hypercapnia: Secondary | ICD-10-CM | POA: Insufficient documentation

## 2015-10-21 DIAGNOSIS — I5032 Chronic diastolic (congestive) heart failure: Secondary | ICD-10-CM

## 2015-10-21 LAB — BASIC METABOLIC PANEL
ANION GAP: 5 (ref 5–15)
BUN: 20 mg/dL (ref 6–20)
CO2: 37 mmol/L — AB (ref 22–32)
Calcium: 9.2 mg/dL (ref 8.9–10.3)
Chloride: 93 mmol/L — ABNORMAL LOW (ref 101–111)
Creatinine, Ser: 1.18 mg/dL (ref 0.61–1.24)
GFR, EST NON AFRICAN AMERICAN: 59 mL/min — AB (ref >60)
GLUCOSE: 152 mg/dL — AB (ref 65–99)
POTASSIUM: 4.4 mmol/L (ref 3.5–5.1)
Sodium: 135 mmol/L (ref 135–145)

## 2015-10-21 LAB — CBC
HEMATOCRIT: 39.9 % — AB (ref 40.0–52.0)
HEMOGLOBIN: 12.7 g/dL — AB (ref 13.0–18.0)
MCH: 27.9 pg (ref 26.0–34.0)
MCHC: 31.8 g/dL — AB (ref 32.0–36.0)
MCV: 87.8 fL (ref 80.0–100.0)
Platelets: 221 10*3/uL (ref 150–440)
RBC: 4.55 MIL/uL (ref 4.40–5.90)
RDW: 14.2 % (ref 11.5–14.5)
WBC: 5.9 10*3/uL (ref 3.8–10.6)

## 2015-10-21 LAB — TROPONIN I

## 2015-10-21 MED ORDER — ONDANSETRON HCL 4 MG/2ML IJ SOLN
4.0000 mg | Freq: Once | INTRAMUSCULAR | Status: AC
  Administered 2015-10-21: 4 mg via INTRAVENOUS

## 2015-10-21 MED ORDER — ONDANSETRON HCL 4 MG/2ML IJ SOLN
4.0000 mg | Freq: Once | INTRAMUSCULAR | Status: AC
  Administered 2015-10-21: 4 mg via INTRAVENOUS
  Filled 2015-10-21: qty 2

## 2015-10-21 MED ORDER — HYDROMORPHONE HCL 1 MG/ML IJ SOLN
0.5000 mg | Freq: Once | INTRAMUSCULAR | Status: AC
  Administered 2015-10-21: 0.5 mg via INTRAVENOUS
  Filled 2015-10-21: qty 1

## 2015-10-21 MED ORDER — ONDANSETRON HCL 4 MG/2ML IJ SOLN
INTRAMUSCULAR | Status: AC
  Administered 2015-10-21: 4 mg via INTRAVENOUS
  Filled 2015-10-21: qty 2

## 2015-10-21 MED ORDER — KETOROLAC TROMETHAMINE 30 MG/ML IJ SOLN
30.0000 mg | Freq: Once | INTRAMUSCULAR | Status: AC
  Administered 2015-10-21: 30 mg via INTRAVENOUS
  Filled 2015-10-21: qty 1

## 2015-10-21 MED ORDER — IOPAMIDOL (ISOVUE-370) INJECTION 76%
100.0000 mL | Freq: Once | INTRAVENOUS | Status: AC | PRN
  Administered 2015-10-21: 100 mL via INTRAVENOUS

## 2015-10-21 NOTE — ED Notes (Signed)
Pt reports he began having left shoulder pain and left sided neck pain Sunday. Pain radiates into left arm. Pt usually sees a chiropractor for similar pain but since Monday the pain has worsened and feels different now. Pt denies chest pain/sob.

## 2015-10-21 NOTE — ED Notes (Signed)
Patient transported to MRI 

## 2015-10-21 NOTE — ED Provider Notes (Signed)
[facility] Emergency Department Provider Note  ____________________________________________  Time seen: Approximately 3:42 PM  I have reviewed the triage vital signs and the nursing notes.   HISTORY  Chief Complaint Shoulder Pain and Neck Pain    HPI Christian [name] is a 75 y.o. male who complains of severe neck pain radiating into left shoulder. He reports the pain woke him up suddenly on Sunday morning. It is sharp stabbing achy and worse with any neck movement. His been constant since it came on. He went to see the chiropractor but did not get any significant relief. Pain is severe. The pain still seems to make it not get worse. Patient denies any numbness or weakness in the arms no chest pain and no radiation down the back. Pain is not making him short of breath anymore than he is usually short of breath.   Past Medical History  Diagnosis Date  . Coronary artery disease   . Diabetes mellitus   . Hyperlipidemia   . Hypertension   . GERD (gastroesophageal reflux disease)   . Arthritis   . Diastolic CHF, chronic ([facility])   . Kidney stones   . Atrial fibrillation ([facility])     DCCV 04/2010  . CO2 retention     during admission to [facility] 2013  . Acute renal failure ([facility])   . Hepatic encephalopathy ([facility])     20 13  . 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 COPD (chronic obstructive pulmonary disease) [facility])     Patient Active Problem List   Diagnosis Date Noted  . Medicare annual wellness visit, initial 05/21/2015  . Advance care planning 05/21/2015  . 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 Fatigue 11/21/2014  . AK (actinic keratosis) 11/03/2014  . Benign paroxysmal positional vertigo 01/16/2014  . Orthostatic hypotension 04/03/2013  . 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 Anemia 06/19/2012  . Chronic diastolic heart failure ([place]) 05/11/2012  . Neck pain 01/30/2012  . Chronic respiratory failure ([place]) 01/17/2012  . Leg  weakness 01/17/2012  . Hip pain 10/17/2011  . Ingrown toenail 10/17/2011  . Hypercarbia 10/14/2011  . Esophagitis 09/11/2011  . Dysphagia 07/21/2011  . Diastolic CHF, acute ([place]) 06/28/2011  . Weight loss, non-intentional 06/28/2011  . Mental confusion 06/28/2011  . Back pain 12/07/2010  . Hyperglycemia 12/07/2010  . LEFT VENTRICULAR FAILURE 05/19/2010  . EDEMA 04/12/2010  . SHORTNESS OF BREATH 03/18/2010  . Hyperlipidemia 03/03/2010  . HYPERTENSION, BENIGN 03/03/2010  . CAD (coronary artery disease) of artery bypass graft 03/03/2010  . ATRIAL FIBRILLATION 03/03/2010    Past Surgical History  Procedure Laterality Date  . Coronary artery bypass graft  2009    x 3 @ [place]  . Cardiac catheterization  2009    showing patent grafts with elevated pulmonary pressures  . Colonoscopy  2011    negative per report  . Iron transfusion      Current Outpatient Rx  Name  Route  Sig  Dispense  Refill  . acetaminophen (TYLENOL) 500 MG tablet   Oral   Take 500 mg by mouth every 6 (six) hours as needed.         . dronedarone (MULTAQ) 400 MG tablet   Oral   Take 1 tablet (400 mg total) by mouth 2 (two) times daily with a meal.   60 tablet   3   . ipratropium (ATROVENT) 0.03 % nasal spray      U 2 SPRAYS IEN 2-3 XD  5   . menthol-cetylpyridinium (CEPACOL) 3 MG lozenge   Oral   Take 1 lozenge by mouth as needed for sore throat.         . metoprolol tartrate (LOPRESSOR) 25 MG tablet      TAKE 1 TABLET BY MOUTH TWICE DAILY   60 tablet   11   . Multiple Vitamins-Minerals (CENTRUM SILVER PO)   Oral   Take by mouth daily.         . nitroGLYCERIN (NITROSTAT) 0.4 MG SL tablet   Sublingual   Place 0.4 mg under the tongue every 5 (five) minutes as needed.           . NON FORMULARY      Oxygen @ 2 liters at bedtime.         Marland Kitchen omeprazole (PRILOSEC) 20 MG capsule      TAKE 1 CAPSULE BY MOUTH EVERY DAY   90 capsule   3   . potassium chloride (K-DUR,KLOR-CON) 10  MEQ tablet      TAKE 1 TABLET BY MOUTH EVERY DAY AS NEEDED   90 tablet   1   . rivaroxaban (XARELTO) 20 MG TABS tablet   Oral   Take 1 tablet (20 mg total) by mouth daily.   90 tablet   3   . spironolactone (ALDACTONE) 25 MG tablet   Oral   Take 25 mg by mouth every other day.          . spironolactone (ALDACTONE) 25 MG tablet      TAKE 1/2 TO 1 TABLET BY MOUTH EVERY DAY AS NEEDED   90 tablet   1   . tamsulosin (FLOMAX) 0.4 MG CAPS capsule      TAKE 1 CAPSULE BY MOUTH EVERY EVENING AFTER DINNER   90 capsule   2   . torsemide (DEMADEX) 20 MG tablet      TAKE 1 TABLET BY MOUTH EVERY OTHER DAY   45 tablet   1   . triamcinolone cream (KENALOG) 0.1 %      APPLY EXTERNALLY TO THE AFFECTED AREA TWICE DAILY   30 g   1   . XARELTO 20 MG TABS tablet      TAKE 1 TABLET BY MOUTH EVERY DAY   30 tablet   3     Allergies Ambien; Crestor; Other; Pradaxa; and Tramadol  Family History  Problem Relation Age of Onset  . Heart disease Brother   . Arthritis Mother   . Hypertension Mother   . Aneurysm Mother   . Heart disease Father   . Hyperlipidemia Father   . Stroke Father   . Diabetes Father   . Heart failure Father   . Heart disease Sister   . Colon cancer Neg Hx   . Prostate cancer Neg Hx   . Heart disease Brother     Social History Social History  Substance Use Topics  . Smoking status: Former Smoker -- 1.50 packs/day for 30 years    Types: Cigarettes    Quit date: 07/11/1981  . Smokeless tobacco: Never Used  . Alcohol Use: No    Review of Systems Constitutional: No fever/chills Eyes: No visual changes. ENT: No sore throat. Cardiovascular: Denies chest pain. Respiratory:Chronic shortness of breath. Gastrointestinal: No abdominal pain.  No nausea, no vomiting.  No diarrhea.  No constipation. Genitourinary: Negative for dysuria. Musculoskeletal: Negative for back pain. Skin: Negative for rash. Neurological: Negative for headaches, focal weakness  or numbness.  10-point  ROS otherwise negative.  ____________________________________________   PHYSICAL EXAM:  VITAL SIGNS: ED Triage Vitals  Enc Vitals Group     BP 10/21/15 1335 113/71 mmHg     Pulse Rate 10/21/15 1335 102     Resp 10/21/15 1335 18     Temp 10/21/15 1335 97.7 F (36.5 C)     Temp Source 10/21/15 1335 Oral     SpO2 10/21/15 1335 97 %     Weight 10/21/15 1335 192 lb (87.091 kg)     Height 10/21/15 1335 5\' 11"  (1.803 m)     Head Cir --      Peak Flow --      Pain Score 10/21/15 1336 8     Pain Loc --      Pain Edu? --      Excl. in [place]? --     Constitutional: Alert and oriented. Sitting very stiffly in bed Eyes: Conjunctivae are normal. PERRL. EOMI. Head: Atraumatic. Nose: No congestion/rhinnorhea. Mouth/Throat: Mucous membranes are moist.  Oropharynx non-erythematous. Neck: No stridor there is some tenderness on palpation of the neck midline into the left side. Cardiovascular: Normal rate, regular rhythm. Grossly normal heart sounds.  Good peripheral circulation. Respiratory: Normal respiratory effort.  No retractions. Lungs CTAB. Gastrointestinal: Soft and nontender. No distention. No abdominal bruits. No CVA tenderness. Musculoskeletal: No lower extremity tenderness nor edema.  No joint effusions. Neurologic:  Normal speech and language. No gross focal neurologic deficits are appreciated. No gait instability. Skin:  Skin is warm, dry and intact. No rash noted. Psychiatric: Mood and affect are normal. Speech and behavior are normal.  ____________________________________________   LABS (all labs ordered are listed, but only abnormal results are displayed)  Labs Reviewed  BASIC METABOLIC PANEL - Abnormal; Notable for the following:    Chloride 93 (*)    CO2 37 (*)    Glucose, Bld 152 (*)    GFR calc non Af Amer 59 (*)    All other components within normal limits  CBC - Abnormal; Notable for the following:    Hemoglobin 12.7 (*)    HCT 39.9 (*)     MCHC 31.8 (*)    All other components within normal limits  TROPONIN I  TROPONIN I   ____________________________________________  EKG  EKG read and interpreted by me shows A. fib at a rate of 109 normal axis no marked ST-T wave changes it looks similar to an EKG from November of last year. ____________________________________________  RADIOLOGY  MRI of neck shows DJD no cord impingement per radiology Chest x-ray shows COPD and enlargement of the cardiac silhouette per radiology ____________________________________________   PROCEDURES   ____________________________________________   INITIAL IMPRESSION / ASSESSMENT AND PLAN / ED COURSE  Pertinent labs & imaging results that were available during my care of the patient were reviewed by me and considered in my medical decision making (see chart for details).  ----------------------------------------- 8:45 PM on 10/21/2015 -----------------------------------------  Patient last vomited 20 minutes ago he is not currently nauseated he has no pain at this time shortness of breath is stable for him. EKG shows A. fib at 1:15 normal axis essentially normal except for the atrial fibrillation and a PVC. We'll repeat a troponin effect negative and the patient has no further vomiting or nausea as he does now I will discharge him. ____________________________________________   FINAL CLINICAL IMPRESSION(S) / ED DIAGNOSES  Final diagnoses:  Neck pain on left side      Nena Polio, MD 10/22/15  0018 

## 2015-10-21 NOTE — Discharge Instructions (Signed)
Please follow-up with your regular doctor. I will give you a prescription for some Vicodin. You can use one half tablet 3 times a day if needed for pain if regular strength Tylenol does not work. Do not take both Tylenol and Vicodin together there is Tylenol in the Vicodin and you could overdose on Tylenol. Do not take the Tylenol within 6 hours of bed so you do not get too sedated. Please return for any further symptoms.

## 2015-10-21 NOTE — ED Notes (Signed)
Discharge instructions reviewed with patient. Patient verbalized understanding. Patient taken to lobby via wheelchair and helped into vehicle by this RN.

## 2015-10-22 ENCOUNTER — Encounter: Payer: Self-pay | Admitting: *Deleted

## 2015-10-22 ENCOUNTER — Emergency Department: Payer: Medicare HMO

## 2015-10-22 ENCOUNTER — Telehealth: Payer: Self-pay | Admitting: Cardiovascular Disease

## 2015-10-22 ENCOUNTER — Inpatient Hospital Stay
Admission: EM | Admit: 2015-10-22 | Discharge: 2015-10-27 | DRG: 871 | Disposition: A | Discharge status: Home Health | Payer: Medicare HMO | Attending: Internal Medicine | Admitting: Internal Medicine

## 2015-10-22 ENCOUNTER — Telehealth: Payer: Self-pay | Admitting: Family Medicine

## 2015-10-22 DIAGNOSIS — N183 Chronic kidney disease, stage 3 (moderate): Secondary | ICD-10-CM | POA: Diagnosis present

## 2015-10-22 DIAGNOSIS — G4736 Sleep related hypoventilation in conditions classified elsewhere: Secondary | ICD-10-CM | POA: Diagnosis present

## 2015-10-22 DIAGNOSIS — I482 Chronic atrial fibrillation: Secondary | ICD-10-CM | POA: Diagnosis present

## 2015-10-22 DIAGNOSIS — I2510 Atherosclerotic heart disease of native coronary artery without angina pectoris: Secondary | ICD-10-CM | POA: Diagnosis present

## 2015-10-22 DIAGNOSIS — J9621 Acute and chronic respiratory failure with hypoxia: Secondary | ICD-10-CM | POA: Diagnosis present

## 2015-10-22 DIAGNOSIS — K922 Gastrointestinal hemorrhage, unspecified: Secondary | ICD-10-CM | POA: Diagnosis present

## 2015-10-22 DIAGNOSIS — I480 Paroxysmal atrial fibrillation: Secondary | ICD-10-CM | POA: Diagnosis present

## 2015-10-22 DIAGNOSIS — J189 Pneumonia, unspecified organism: Secondary | ICD-10-CM | POA: Diagnosis present

## 2015-10-22 DIAGNOSIS — Z7901 Long term (current) use of anticoagulants: Secondary | ICD-10-CM | POA: Diagnosis not present

## 2015-10-22 DIAGNOSIS — G8929 Other chronic pain: Secondary | ICD-10-CM | POA: Diagnosis present

## 2015-10-22 DIAGNOSIS — Z9981 Dependence on supplemental oxygen: Secondary | ICD-10-CM | POA: Diagnosis not present

## 2015-10-22 DIAGNOSIS — J9601 Acute respiratory failure with hypoxia: Secondary | ICD-10-CM

## 2015-10-22 DIAGNOSIS — E1122 Type 2 diabetes mellitus with diabetic chronic kidney disease: Secondary | ICD-10-CM | POA: Diagnosis present

## 2015-10-22 DIAGNOSIS — J9622 Acute and chronic respiratory failure with hypercapnia: Secondary | ICD-10-CM | POA: Diagnosis not present

## 2015-10-22 DIAGNOSIS — G709 Myoneural disorder, unspecified: Secondary | ICD-10-CM | POA: Diagnosis present

## 2015-10-22 DIAGNOSIS — I130 Hypertensive heart and chronic kidney disease with heart failure and stage 1 through stage 4 chronic kidney disease, or unspecified chronic kidney disease: Secondary | ICD-10-CM | POA: Diagnosis present

## 2015-10-22 DIAGNOSIS — Z87891 Personal history of nicotine dependence: Secondary | ICD-10-CM

## 2015-10-22 DIAGNOSIS — I872 Venous insufficiency (chronic) (peripheral): Secondary | ICD-10-CM | POA: Diagnosis present

## 2015-10-22 DIAGNOSIS — I5032 Chronic diastolic (congestive) heart failure: Secondary | ICD-10-CM | POA: Diagnosis present

## 2015-10-22 DIAGNOSIS — G4733 Obstructive sleep apnea (adult) (pediatric): Secondary | ICD-10-CM | POA: Diagnosis present

## 2015-10-22 DIAGNOSIS — Z951 Presence of aortocoronary bypass graft: Secondary | ICD-10-CM | POA: Diagnosis not present

## 2015-10-22 DIAGNOSIS — Z888 Allergy status to other drugs, medicaments and biological substances status: Secondary | ICD-10-CM

## 2015-10-22 DIAGNOSIS — M542 Cervicalgia: Secondary | ICD-10-CM | POA: Diagnosis present

## 2015-10-22 DIAGNOSIS — K219 Gastro-esophageal reflux disease without esophagitis: Secondary | ICD-10-CM | POA: Diagnosis present

## 2015-10-22 DIAGNOSIS — I4891 Unspecified atrial fibrillation: Secondary | ICD-10-CM | POA: Diagnosis not present

## 2015-10-22 DIAGNOSIS — A419 Sepsis, unspecified organism: Principal | ICD-10-CM | POA: Insufficient documentation

## 2015-10-22 DIAGNOSIS — N400 Enlarged prostate without lower urinary tract symptoms: Secondary | ICD-10-CM | POA: Diagnosis present

## 2015-10-22 DIAGNOSIS — I110 Hypertensive heart disease with heart failure: Secondary | ICD-10-CM | POA: Diagnosis not present

## 2015-10-22 DIAGNOSIS — J9600 Acute respiratory failure, unspecified whether with hypoxia or hypercapnia: Secondary | ICD-10-CM | POA: Diagnosis not present

## 2015-10-22 DIAGNOSIS — J449 Chronic obstructive pulmonary disease, unspecified: Secondary | ICD-10-CM | POA: Diagnosis present

## 2015-10-22 DIAGNOSIS — E119 Type 2 diabetes mellitus without complications: Secondary | ICD-10-CM

## 2015-10-22 DIAGNOSIS — E785 Hyperlipidemia, unspecified: Secondary | ICD-10-CM | POA: Diagnosis present

## 2015-10-22 DIAGNOSIS — J690 Pneumonitis due to inhalation of food and vomit: Secondary | ICD-10-CM | POA: Diagnosis present

## 2015-10-22 DIAGNOSIS — R0902 Hypoxemia: Secondary | ICD-10-CM | POA: Insufficient documentation

## 2015-10-22 DIAGNOSIS — I119 Hypertensive heart disease without heart failure: Secondary | ICD-10-CM | POA: Diagnosis present

## 2015-10-22 DIAGNOSIS — E1159 Type 2 diabetes mellitus with other circulatory complications: Secondary | ICD-10-CM | POA: Diagnosis not present

## 2015-10-22 DIAGNOSIS — J9690 Respiratory failure, unspecified, unspecified whether with hypoxia or hypercapnia: Secondary | ICD-10-CM | POA: Insufficient documentation

## 2015-10-22 DIAGNOSIS — Z8249 Family history of ischemic heart disease and other diseases of the circulatory system: Secondary | ICD-10-CM | POA: Diagnosis not present

## 2015-10-22 HISTORY — DX: Chronic kidney disease, stage 3 unspecified: N18.30

## 2015-10-22 HISTORY — DX: Chronic atrial fibrillation, unspecified: I48.20

## 2015-10-22 HISTORY — DX: Hypertensive heart disease without heart failure: I11.9

## 2015-10-22 HISTORY — DX: Calculus of kidney: N20.0

## 2015-10-22 HISTORY — DX: Type 2 diabetes mellitus without complications: E11.9

## 2015-10-22 HISTORY — DX: Chronic kidney disease, stage 3 (moderate): N18.3

## 2015-10-22 LAB — COMPREHENSIVE METABOLIC PANEL
ALBUMIN: 3.3 g/dL — AB (ref 3.5–5.0)
ALT: 13 U/L — AB (ref 17–63)
AST: 22 U/L (ref 15–41)
Alkaline Phosphatase: 88 U/L (ref 38–126)
Anion gap: 6 (ref 5–15)
BUN: 28 mg/dL — AB (ref 6–20)
CHLORIDE: 93 mmol/L — AB (ref 101–111)
CO2: 36 mmol/L — AB (ref 22–32)
CREATININE: 1.37 mg/dL — AB (ref 0.61–1.24)
Calcium: 8.8 mg/dL — ABNORMAL LOW (ref 8.9–10.3)
GFR calc Af Amer: 57 mL/min — ABNORMAL LOW (ref >60)
GFR calc non Af Amer: 49 mL/min — ABNORMAL LOW (ref >60)
GLUCOSE: 136 mg/dL — AB (ref 65–99)
Potassium: 4.6 mmol/L (ref 3.5–5.1)
Sodium: 135 mmol/L (ref 135–145)
Total Bilirubin: 1 mg/dL (ref 0.3–1.2)
Total Protein: 6.4 g/dL — ABNORMAL LOW (ref 6.5–8.1)

## 2015-10-22 LAB — CBC WITH DIFFERENTIAL/PLATELET
BASOS ABS: 0.1 10*3/uL (ref 0–0.1)
BASOS PCT: 0 %
EOS ABS: 0 10*3/uL (ref 0–0.7)
EOS PCT: 0 %
HEMATOCRIT: 39.2 % — AB (ref 40.0–52.0)
Hemoglobin: 12.2 g/dL — ABNORMAL LOW (ref 13.0–18.0)
Lymphocytes Relative: 1 %
Lymphs Abs: 0.2 10*3/uL — ABNORMAL LOW (ref 1.0–3.6)
MCH: 28.4 pg (ref 26.0–34.0)
MCHC: 31.2 g/dL — AB (ref 32.0–36.0)
MCV: 91 fL (ref 80.0–100.0)
MONO ABS: 1.6 10*3/uL — AB (ref 0.2–1.0)
MONOS PCT: 7 %
NEUTROS ABS: 19.5 10*3/uL — AB (ref 1.4–6.5)
Neutrophils Relative %: 92 %
PLATELETS: 211 10*3/uL (ref 150–440)
RBC: 4.3 MIL/uL — ABNORMAL LOW (ref 4.40–5.90)
RDW: 14.6 % — AB (ref 11.5–14.5)
WBC: 21.3 10*3/uL — ABNORMAL HIGH (ref 3.8–10.6)

## 2015-10-22 LAB — PROTIME-INR
INR: 3.78
PROTHROMBIN TIME: 36.4 s — AB (ref 11.4–15.0)

## 2015-10-22 LAB — BLOOD GAS, ARTERIAL
ACID-BASE EXCESS: 8.2 mmol/L — AB (ref 0.0–3.0)
Allens test (pass/fail): POSITIVE — AB
BICARBONATE: 37.6 meq/L — AB (ref 21.0–28.0)
FIO2: 0.24
O2 SAT: 89.5 %
PATIENT TEMPERATURE: 37
PH ART: 7.28 — AB (ref 7.350–7.450)
pCO2 arterial: 80 mmHg (ref 32.0–48.0)
pO2, Arterial: 65 mmHg — ABNORMAL LOW (ref 83.0–108.0)

## 2015-10-22 LAB — LACTIC ACID, PLASMA
LACTIC ACID, VENOUS: 1.8 mmol/L (ref 0.5–2.0)
Lactic Acid, Venous: 1.3 mmol/L (ref 0.5–2.0)

## 2015-10-22 LAB — MAGNESIUM: Magnesium: 1.8 mg/dL (ref 1.7–2.4)

## 2015-10-22 LAB — MRSA PCR SCREENING: MRSA by PCR: NEGATIVE

## 2015-10-22 LAB — LIPASE, BLOOD: LIPASE: 17 U/L (ref 11–51)

## 2015-10-22 LAB — PHOSPHORUS: Phosphorus: 3.7 mg/dL (ref 2.5–4.6)

## 2015-10-22 LAB — TROPONIN I: Troponin I: 0.03 ng/mL (ref <0.031)

## 2015-10-22 MED ORDER — NITROGLYCERIN 0.4 MG SL SUBL: 0.4000 mg | SUBLINGUAL_TABLET | SUBLINGUAL | Status: DC | PRN

## 2015-10-22 MED ORDER — PIPERACILLIN-TAZOBACTAM 3.375 G IVPB
3.3750 g | Freq: Three times a day (TID) | INTRAVENOUS | Status: DC
  Administered 2015-10-22 – 2015-10-24 (×5): 3.375 g via INTRAVENOUS
  Filled 2015-10-22 (×7): qty 50

## 2015-10-22 MED ORDER — PANTOPRAZOLE SODIUM 40 MG IV SOLR
40.0000 mg | Freq: Two times a day (BID) | INTRAVENOUS | Status: DC
  Administered 2015-10-22 – 2015-10-26 (×9): 40 mg via INTRAVENOUS
  Filled 2015-10-22 (×9): qty 40

## 2015-10-22 MED ORDER — DILTIAZEM HCL 25 MG/5ML IV SOLN
10.0000 mg | Freq: Four times a day (QID) | INTRAVENOUS | Status: DC | PRN
  Administered 2015-10-23 – 2015-10-25 (×5): 10 mg via INTRAVENOUS
  Filled 2015-10-22 (×8): qty 5

## 2015-10-22 MED ORDER — IPRATROPIUM BROMIDE 0.03 % NA SOLN
2.0000 | Freq: Three times a day (TID) | NASAL | Status: DC
  Administered 2015-10-22 – 2015-10-26 (×9): 2 via NASAL
  Filled 2015-10-22 (×3): qty 30

## 2015-10-22 MED ORDER — SPIRONOLACTONE 25 MG PO TABS
25.0000 mg | ORAL_TABLET | ORAL | Status: DC
  Administered 2015-10-22 – 2015-10-26 (×3): 25 mg via ORAL
  Filled 2015-10-22 (×3): qty 1

## 2015-10-22 MED ORDER — ONDANSETRON HCL 4 MG PO TABS: 4.0000 mg | ORAL_TABLET | Freq: Four times a day (QID) | ORAL | Status: DC | PRN

## 2015-10-22 MED ORDER — SODIUM CHLORIDE 0.9 % IV SOLN
INTRAVENOUS | Status: DC
  Administered 2015-10-22: 18:00:00 via INTRAVENOUS

## 2015-10-22 MED ORDER — METOPROLOL TARTRATE 25 MG PO TABS
25.0000 mg | ORAL_TABLET | Freq: Two times a day (BID) | ORAL | Status: DC
  Administered 2015-10-22 – 2015-10-27 (×11): 25 mg via ORAL
  Filled 2015-10-22 (×13): qty 1

## 2015-10-22 MED ORDER — DEXTROSE 5 % IV SOLN
500.0000 mg | Freq: Once | INTRAVENOUS | Status: AC
  Administered 2015-10-22: 500 mg via INTRAVENOUS
  Filled 2015-10-22: qty 500

## 2015-10-22 MED ORDER — TAMSULOSIN HCL 0.4 MG PO CAPS
0.4000 mg | ORAL_CAPSULE | Freq: Every day | ORAL | Status: DC
  Administered 2015-10-22 – 2015-10-26 (×5): 0.4 mg via ORAL
  Filled 2015-10-22 (×5): qty 1

## 2015-10-22 MED ORDER — PIPERACILLIN-TAZOBACTAM 3.375 G IVPB 30 MIN
3.3750 g | Freq: Once | INTRAVENOUS | Status: AC
  Administered 2015-10-22: 3.375 g via INTRAVENOUS
  Filled 2015-10-22: qty 50

## 2015-10-22 MED ORDER — ONDANSETRON HCL 4 MG/2ML IJ SOLN
4.0000 mg | Freq: Four times a day (QID) | INTRAMUSCULAR | Status: DC | PRN
Start: 2015-10-22 — End: 2015-10-27

## 2015-10-22 MED ORDER — TORSEMIDE 20 MG PO TABS
20.0000 mg | ORAL_TABLET | ORAL | Status: DC
  Administered 2015-10-22 – 2015-10-24 (×2): 20 mg via ORAL
  Filled 2015-10-22 (×2): qty 1

## 2015-10-22 MED ORDER — ACETAMINOPHEN 325 MG PO TABS
650.0000 mg | ORAL_TABLET | Freq: Four times a day (QID) | ORAL | Status: DC | PRN
  Administered 2015-10-22: 650 mg via ORAL
  Filled 2015-10-22: qty 2

## 2015-10-22 MED ORDER — ACETAMINOPHEN 650 MG RE SUPP: 650.0000 mg | Freq: Four times a day (QID) | RECTAL | Status: DC | PRN

## 2015-10-22 MED ORDER — SODIUM CHLORIDE 0.9 % IV BOLUS (SEPSIS)
1000.0000 mL | Freq: Once | INTRAVENOUS | Status: AC
  Administered 2015-10-22: 1000 mL via INTRAVENOUS

## 2015-10-22 MED ORDER — CETYLPYRIDINIUM CHLORIDE 0.05 % MT LIQD
7.0000 mL | Freq: Two times a day (BID) | OROMUCOSAL | Status: DC
  Administered 2015-10-22 – 2015-10-26 (×7): 7 mL via OROMUCOSAL

## 2015-10-22 MED ORDER — GLUCOSAMINE-CHONDROITIN 500-400 MG PO TABS: 1.0000 | ORAL_TABLET | Freq: Every day | ORAL | Status: DC

## 2015-10-22 MED ORDER — DRONEDARONE HCL 400 MG PO TABS
400.0000 mg | ORAL_TABLET | Freq: Two times a day (BID) | ORAL | Status: DC
  Administered 2015-10-23 – 2015-10-24 (×4): 400 mg via ORAL
  Filled 2015-10-22 (×7): qty 1

## 2015-10-22 MED ORDER — DEXTROSE 5 % IV SOLN
1.0000 g | Freq: Once | INTRAVENOUS | Status: AC
  Administered 2015-10-22: 1 g via INTRAVENOUS
  Filled 2015-10-22: qty 10

## 2015-10-22 NOTE — Telephone Encounter (Signed)
Patient Name: TREYGAN VIEW  DOB: [birth year]    Initial Comment Caller states father was in ER yesterday afternoon for extreme neck pain and shoulder on the left side. had CT Scan and MRI and last night they said it was extreme arthritis. was given a half of a MG of Dilaldid and addition meds to keep him from vomiting. Vomiting with blood in it. and get his O2 Level past 80    Nurse Assessment  Nurse: Raphael Gibney, RN, Vera Date/Time (Eastern Time): 10/22/2015 8:10:53 AM  Confirm and document reason for call. If symptomatic, describe symptoms. You must click the next button to save text entered. ---Caller states father was in the ER for severe neck and left shoulder pain. He had MRI and CT scan and they said it was extreme arthritis. He had 0.5 mg of Dilaudid. O2 level is 84:%. He vomited several times in the ER. Vomited again on the way home. He is on blood thinner. He has blood in his emesis. At 5 am, he was not very alert and not answering questions appropriately. O2 level has dropped to 81% while talking to the daughter.  Has the patient traveled out of the country within the last 30 days? ---Not Applicable  Does the patient have any new or worsening symptoms? ---Yes  Will a triage be completed? ---Yes  Related visit to physician within the last 2 weeks? ---Yes  Does the PT have any chronic conditions? (i.e. diabetes, asthma, etc.) ---Yes  List chronic conditions. ---A fib;  Is this a behavioral health or substance abuse call? ---No     Guidelines    Guideline Title Affirmed Question Affirmed Notes  Vomiting Blood Taking Coumadin (warfarin) or other strong blood thinner, or known bleeding disorder (e.g., thrombocytopenia)    Final Disposition User   Call EMS 911 Now Raphael Gibney, RN, Vera    Comments  Triage outcome upgraded to call 911 as oxygen level is now down to 81% and pulse is ranging from 44-140.  Pulse is ranging from 77 to 140.   Referrals  [facility] - ED    Disagree/Comply: Comply

## 2015-10-22 NOTE — Progress Notes (Signed)
Pharmacy Antibiotic Note  Christian [name] is a 75 y.o. male admitted on 10/22/2015 with aspiration pneumonia.  Pharmacy has been consulted for piperacillin/tazobactam dosing.  Plan: Zosyn 3.375g IV q8h (4 hour infusion).  Height: 5\' 10"  (177.8 cm) Weight: 193 lb (87.544 kg) IBW/kg (Calculated) : 73  Temp (24hrs), Avg:97.3 F (36.3 C), Min:97.3 F (36.3 C), Max:97.3 F (36.3 C)   Recent Labs Lab 10/21/15 1338 10/22/15 1017  WBC 5.9 21.3*  CREATININE 1.18 1.37*    Estimated Creatinine Clearance: 48.8 mL/min (by C-G formula based on Cr of 1.37).    Allergies  Allergen Reactions  . Ambien [Zolpidem Tartrate]     Over sedation  . Crestor [Rosuvastatin Calcium]     myalgias  . Other     Sedative or medications for sleep.  . Pradaxa [Dabigatran Etexilate Mesylate]     Inflammation in throat.  . Tramadol     Intolerant but not an allergy; sedation   Antimicrobials this admission: Piperacillin/tazobactam 4/13 >>  CTX and azithromycin 1 dose in ED 4/13   Microbiology results: 4/13 BCx: Sent  Thank you for allowing pharmacy to be a part of this patient's care.  Lenis Noon, PharmD Clinical Pharmacist 10/22/2015 2:17 PM

## 2015-10-22 NOTE — ED Notes (Addendum)
Pt was seen yesterday for neck pain, pt reports vomiting since Sunday, daughter reports blood in vomit, pt denies pain, pt arrived to ED via [facility], blood sugar 130, O2 on RA when EMS arrived was 68%, O2 applied by EMS O2 sat 96% on 2 L

## 2015-10-22 NOTE — Consult Note (Signed)
Pt seen and orders reviewed. Full consult note to follow  [name], MD PCCM service Mobile [phone] Pager [phone] 10/22/2015

## 2015-10-22 NOTE — Progress Notes (Signed)
PHARMACIST - PHYSICIAN ORDER COMMUNICATION  CONCERNING: P&T Medication Policy on Herbal Medications  DESCRIPTION:  This patient's order for:  Glucosamine-Chondroitin  has been noted.  This product(s) is classified as an "herbal" or natural product. Due to a lack of definitive safety studies or FDA approval, nonstandard manufacturing practices, plus the potential risk of unknown drug-drug interactions while on inpatient medications, the Pharmacy and Therapeutics Committee does not permit the use of "herbal" or natural products of this type within [place].   ACTION TAKEN: The pharmacy department is unable to verify this order at this time and your patient has been informed of this safety policy. Please reevaluate patient's clinical condition at discharge and address if the herbal or natural product(s) should be resumed at that time.   

## 2015-10-22 NOTE — Telephone Encounter (Signed)
I'll await the ER notes.  Thanks.  

## 2015-10-22 NOTE — Progress Notes (Signed)
[facility] Progress Note Patient Name: Christian [name] DOB: January 11, 1941 MRN: [id number]   Date of Service  10/22/2015  HPI/Events of Note  Pt has an astral NIV at home.  Also, daughter wants Dr. Lauretta Grill consulted.   eICU Interventions  Plan : 1.will order astral. 2. Told RN to call Dr. Verdell Carmine if OK to consult cards.      Intervention Category Minor Interventions: Other:  Rush Landmark 10/22/2015, 4:39 PM

## 2015-10-22 NOTE — Telephone Encounter (Signed)
Pt is at [facility] ED now.

## 2015-10-22 NOTE — Telephone Encounter (Signed)
PLEASE NOTE: All timestamps contained within this report are represented as [place] Standard Time. CONFIDENTIALTY NOTICE: This fax transmission is intended only for the addressee. It contains information that is legally privileged, confidential or otherwise protected from use or disclosure. If you are not the intended recipient, you are strictly prohibited from reviewing, disclosing, copying using or disseminating any of this information or taking any action in reliance on or regarding this information. If you have received this fax in error, please notify us immediately by telephone so that we can arrange for its return to [place]. Phone: [phone], Toll-Free: [phone], Fax: [phone] Page: 1 of 2 Call Id: [id number] [place] Patient Name: Christian [name] Gender: Male DOB: [birth year] Age: 75 Y 2 M 16 D Return Phone Number: [phone] (Primary) Address: City/State/Zip: [place] Client [place] [facility] - Client Client Site [place] - Day Physician Renford Dills Contact Type Call Who Is Calling Patient / Member / Family / Caregiver Call Type Triage / Clinical Caller Name Anderson [name] Relationship To Patient Daughter Return Phone Number [phone] (Primary) Chief Complaint VOMITING - Blood Reason for Call Symptomatic / Request for Health Information Initial Comment Caller states father was in ER yesterday afternoon for extreme neck pain and shoulder on the left side. had CT Scan and MRI and last night they said it was extreme arthritis. was given a half of a MG of Dilaldid and addition meds to keep him from vomiting. Vomiting with blood in it. and get his O2 Level past 80 Appointment Disposition EMR Appointment Not Necessary Info pasted into Epic Yes PreDisposition Call Doctor Translation No Nurse Assessment Nurse: Raphael Gibney, RN, Vanita Ingles Date/Time (Eastern  Time): 10/22/2015 8:10:53 AM Confirm and document reason for call. If symptomatic, describe symptoms. You must click the next button to save text entered. ---Caller states father was in the ER for severe neck and left shoulder pain. He had MRI and CT scan and they said it was extreme arthritis. He had 0.5 mg of Dilaudid. O2 level is 84: %. He vomited several times in the ER. Vomited again on the way home. He is on blood thinner. He has blood in his emesis. At 5 am, he was not very alert and not answering questions appropriately. O2 level has dropped to 81% while talking to the daughter. Has the patient traveled out of the country within the last 30 days? ---Not Applicable Does the patient have any new or worsening symptoms? ---Yes Will a triage be completed? ---Yes Related visit to physician within the last 2 weeks? ---Yes Does the PT have any chronic conditions? (i.e. diabetes, asthma, etc.) ---Yes List chronic conditions. ---A fib; Is this a behavioral health or substance abuse call? ---No PLEASE NOTE: All timestamps contained within this report are represented as [place] Standard Time. CONFIDENTIALTY NOTICE: This fax transmission is intended only for the addressee. It contains information that is legally privileged, confidential or otherwise protected from use or disclosure. If you are not the intended recipient, you are strictly prohibited from reviewing, disclosing, copying using or disseminating any of this information or taking any action in reliance on or regarding this information. If you have received this fax in error, please notify us immediately by telephone so that we can arrange for its return to [place]. Phone: [phone], Toll-Free: [phone], Fax: [phone] Page: 2 of 2 Call Id: [id number] Guidelines Guideline Title Affirmed Question Affirmed Notes Nurse Date/Time Eilene Ghazi  Time) Vomiting Blood Taking Coumadin (warfarin) or other strong blood thinner, or  known bleeding disorder (e.g., thrombocytopenia) Raphael Gibney, RN, Vanita Ingles 10/22/2015 8:14:58 AM Disp. Time Eilene Ghazi Time) Disposition Final User 10/22/2015 8:09:21 AM Send to Urgent Toni Arthurs 10/22/2015 8:27:32 AM Send To RN Personal Raphael Gibney, RN, Vera 10/22/2015 8:32:12 AM 911 Outcome Documentation Raphael Gibney, RN, Vanita Ingles Reason: Attempted 911 outcome documentation call and left message. 10/22/2015 8:26:13 AM Call EMS 911 Now Yes Raphael Gibney, RN, Vanita Ingles Disposition Overriden: Go to ED Now Override Reason: Patient's symptoms need a higher level of care Caller Understands: Yes Disagree/Comply: Comply Care Advice Given Per Guideline CALL EMS 911 NOW: Immediate medical attention is needed. You need to hang up and call 911 (or an ambulance). Psychologist, forensic Discretion: I'll call you back in a few minutes to be sure you were able to reach them.) CARE ADVICE given per Vomiting Blood (Adult) guideline. Comments User: Dannielle Burn, RN Date/Time Eilene Ghazi Time): 10/22/2015 8:27:02 AM Triage outcome upgraded to call 911 as oxygen level is now down to 81% and pulse is ranging from 44-140. User: Dannielle Burn, RN Date/Time Eilene Ghazi Time): 10/22/2015 8:27:16 AM Pulse is ranging from 77 to 140. Referrals [facility] - ED

## 2015-10-22 NOTE — Progress Notes (Signed)
[place] Progress Note Patient Name: Christian [name] DOB: 1940/12/24 MRN: [id number]   Date of Service  10/22/2015  HPI/Events of Note  RN calls re: new pt in ICU. Pt admitted for aspiration PNA. Recievd painmeds earlier and started vomiting and became nauseated.  CXR with possible LLL infiltrate. WBC 21. ABG 7.28/80/65 on 1L.   Pt looks comfortable, not drowsy, not in distress, waving at me. 90/70, 120s. 20-30 92% on 1-2L.   RN states he is NOT vomiting, just some cough  eICU Interventions  Plan : 1. Try bipap 12/5, keep o2 sats > 91 2. Told RN to call if pt starts vomiting > will dc bipap and may use HFNC     Intervention Category Evaluation Type: New Patient Evaluation  Rush Landmark 10/22/2015, 4:19 PM

## 2015-10-22 NOTE — ED Provider Notes (Signed)
[facility] Emergency Department Provider Note  ____________________________________________  Time seen: 10:05 AM  I have reviewed the triage vital signs and the nursing notes.   HISTORY  Chief Complaint Nausea and Emesis    HPI Christian [name] is a 75 y.o. male brought to the ED due to bloody vomit this morning. The patient has a history of hypoventilation while sleeping but does not use supplemental oxygen while awake during the day. Also has a history of chronic neck pain. Yesterday he was seen in the emergency department for acute worsening of the chronic neck pain over the last 5 days. He had an MRI of the C-spine as well as a CT angiogram of the neck which did show some vertebral stenosis. He was given Dilaudid in the emergency department for his pain, which unfortunately resulted in him starting to vomit frequently. He continued vomiting at night after discharge home last night and then this morning daughter noted some blood in the vomit. He is on Xarelto for chronic atrial fibrillation. She reports some increase in shortness of breath as well as dizziness with standing.  Denies chest pain. Per EMS, room air oxygen saturation was 68%. Family reports that normally his room air oxygen saturation is in the range of 85-90%. Patient has been having a nonproductive cough.     Past Medical History  Diagnosis Date  . Coronary artery disease   . Diabetes mellitus   . Hyperlipidemia   . Hypertension   . GERD (gastroesophageal reflux disease)   . Arthritis   . Diastolic CHF, chronic ([place])   . Kidney stones   . Atrial fibrillation ([place])     DCCV 04/2010  . CO2 retention     during admission to [facility] 2013  . Acute renal failure ([place])   . Hepatic encephalopathy ([place])     2013  . 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 COPD (chronic obstructive pulmonary disease) [facility])      Patient Active Problem List   Diagnosis Date Noted   . Medicare annual wellness visit, initial 05/21/2015  . Advance care planning 05/21/2015  . 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 Fatigue 11/21/2014  . AK (actinic keratosis) 11/03/2014  . Benign paroxysmal positional vertigo 01/16/2014  . Orthostatic hypotension 04/03/2013  . 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 Anemia 06/19/2012  . Chronic diastolic heart failure ([place]) 05/11/2012  . Neck pain 01/30/2012  . Chronic respiratory failure ([place]) 01/17/2012  . Leg weakness 01/17/2012  . Hip pain 10/17/2011  . Ingrown toenail 10/17/2011  . Hypercarbia 10/14/2011  . Esophagitis 09/11/2011  . Dysphagia 07/21/2011  . Diastolic CHF, acute ([place]) 06/28/2011  . Weight loss, non-intentional 06/28/2011  . Mental confusion 06/28/2011  . Back pain 12/07/2010  . Hyperglycemia 12/07/2010  . LEFT VENTRICULAR FAILURE 05/19/2010  . EDEMA 04/12/2010  . SHORTNESS OF BREATH 03/18/2010  . Hyperlipidemia 03/03/2010  . HYPERTENSION, BENIGN 03/03/2010  . CAD (coronary artery disease) of artery bypass graft 03/03/2010  . ATRIAL FIBRILLATION 03/03/2010     Past Surgical History  Procedure Laterality Date  . Coronary artery bypass graft  2009    x 3 @ [facility]  . Cardiac catheterization  2009    showing patent grafts with elevated pulmonary pressures  . Colonoscopy  2011    negative per report  . Iron transfusion       Current Outpatient Rx  Name  Route  Sig  Dispense  Refill  . acetaminophen (TYLENOL)  500 MG tablet   Oral   Take 500 mg by mouth every 6 (six) hours as needed.         . dronedarone (MULTAQ) 400 MG tablet   Oral   Take 1 tablet (400 mg total) by mouth 2 (two) times daily with a meal.   60 tablet   3   . ipratropium (ATROVENT) 0.03 % nasal spray      U 2 SPRAYS IEN 2-3 XD      5   . menthol-cetylpyridinium (CEPACOL) 3 MG lozenge   Oral   Take 1 lozenge by mouth as needed for sore throat.         . metoprolol tartrate (LOPRESSOR) 25 MG tablet      TAKE 1 TABLET  BY MOUTH TWICE DAILY   60 tablet   11   . Multiple Vitamins-Minerals (CENTRUM SILVER PO)   Oral   Take by mouth daily.         . nitroGLYCERIN (NITROSTAT) 0.4 MG SL tablet   Sublingual   Place 0.4 mg under the tongue every 5 (five) minutes as needed.           . NON FORMULARY      Oxygen @ 2 liters at bedtime.         Marland Kitchen omeprazole (PRILOSEC) 20 MG capsule      TAKE 1 CAPSULE BY MOUTH EVERY DAY   90 capsule   3   . potassium chloride (K-DUR,KLOR-CON) 10 MEQ tablet      TAKE 1 TABLET BY MOUTH EVERY DAY AS NEEDED   90 tablet   1   . rivaroxaban (XARELTO) 20 MG TABS tablet   Oral   Take 1 tablet (20 mg total) by mouth daily.   90 tablet   3   . spironolactone (ALDACTONE) 25 MG tablet   Oral   Take 25 mg by mouth every other day.          . spironolactone (ALDACTONE) 25 MG tablet      TAKE 1/2 TO 1 TABLET BY MOUTH EVERY DAY AS NEEDED   90 tablet   1   . tamsulosin (FLOMAX) 0.4 MG CAPS capsule      TAKE 1 CAPSULE BY MOUTH EVERY EVENING AFTER DINNER   90 capsule   2   . torsemide (DEMADEX) 20 MG tablet      TAKE 1 TABLET BY MOUTH EVERY OTHER DAY   45 tablet   1   . triamcinolone cream (KENALOG) 0.1 %      APPLY EXTERNALLY TO THE AFFECTED AREA TWICE DAILY   30 g   1   . XARELTO 20 MG TABS tablet      TAKE 1 TABLET BY MOUTH EVERY DAY   30 tablet   3      Allergies Ambien; Crestor; Other; Pradaxa; and Tramadol   Family History  Problem Relation Age of Onset  . Heart disease Brother   . Arthritis Mother   . Hypertension Mother   . Aneurysm Mother   . Heart disease Father   . Hyperlipidemia Father   . Stroke Father   . Diabetes Father   . Heart failure Father   . Heart disease Sister   . Colon cancer Neg Hx   . Prostate cancer Neg Hx   . Heart disease Brother     Social History Social History  Substance Use Topics  . Smoking status: Former Smoker -- 1.50 packs/day for 30  years    Types: Cigarettes    Quit date: 07/11/1981   . Smokeless tobacco: Never Used  . Alcohol Use: No    Review of Systems  Constitutional:   No fever or chills.  Eyes:   No vision changes.  ENT:   No sore throat. No rhinorrhea. Cardiovascular:   No chest pain. Respiratory:   Positive shortness of breath and cough. Gastrointestinal:   Negative for abdominal pain, vomiting and diarrhea.  No bloody stool. Genitourinary:   Negative for dysuria or difficulty urinating. Musculoskeletal:   Negative for focal pain or swelling Neurological:   Negative for headaches. Positive dizziness  10-point ROS otherwise negative.  ____________________________________________   PHYSICAL EXAM:  VITAL SIGNS: ED Triage Vitals  Enc Vitals Group     BP 10/22/15 1005 95/69 mmHg     Pulse Rate 10/22/15 1005 128     Resp 10/22/15 1005 20     Temp 10/22/15 1005 97.3 F (36.3 C)     Temp Source 10/22/15 1005 Oral     SpO2 10/22/15 1005 94 %     Weight 10/22/15 1005 193 lb (87.544 kg)     Height 10/22/15 1005 5\' 10"  (1.778 m)     Head Cir --      Peak Flow --      Pain Score --      Pain Loc --      Pain Edu? --      Excl. in GC? --   Oxygen saturation 77% on room air, 90-94% on 2 L nasal cannula  Vital signs reviewed, nursing assessments reviewed.   Constitutional:   Alert and oriented. Ill-appearing. Eyes:   No scleral icterus. No conjunctival pallor. PERRL. EOMI ENT   Head:   Normocephalic and atraumatic.   Nose:   No congestion/rhinnorhea. No septal hematoma   Mouth/Throat:   Dry mucous membranes, no pharyngeal erythema. No peritonsillar mass.    Neck:   No stridor. No SubQ emphysema. No meningismus. Hematological/Lymphatic/Immunilogical:   No cervical lymphadenopathy. Cardiovascular:   Irregularly irregular rhythm, tachycardic to 140.Marland Kitchen Symmetric bilateral radial and DP pulses.  No murmurs.  Respiratory:  Tachypnea with shallow respirations. Good air entry in all lung fields. No wheezing. Crackles at bilateral  bases.  Gastrointestinal:   Soft and nontender. Non distended. There is no CVA tenderness.  No rebound, rigidity, or guarding. Rectal exam reveals brown stool, Hemoccult negative, controls  Genitourinary:   deferred Musculoskeletal:   Nontender with normal range of motion in all extremities. No joint effusions.  No lower extremity tenderness.  No edema. Neurologic:   Normal speech and language.  CN 2-10 normal. Motor grossly intact. Normal gait though limited by generalized weakness No gross focal neurologic deficits are appreciated.  Skin:    Skin is warm, dry and intact. No rash noted.  No petechiae, purpura, or bullae.  ____________________________________________    LABS (pertinent positives/negatives) (all labs ordered are listed, but only abnormal results are displayed) Labs Reviewed  CBC WITH DIFFERENTIAL/PLATELET - Abnormal; Notable for the following:    WBC 21.3 (*)    RBC 4.30 (*)    Hemoglobin 12.2 (*)    HCT 39.2 (*)    MCHC 31.2 (*)    RDW 14.6 (*)    Neutro Abs 19.5 (*)    Lymphs Abs 0.2 (*)    Monocytes Absolute 1.6 (*)    All other components within normal limits  PROTIME-INR - Abnormal; Notable for the following:    Prothrombin  Time 36.4 (*)    All other components within normal limits  CULTURE, BLOOD (ROUTINE X 2)  CULTURE, BLOOD (ROUTINE X 2)  LACTIC ACID, PLASMA  LACTIC ACID, PLASMA  BLOOD GAS, ARTERIAL  TROPONIN I  PHOSPHORUS  MAGNESIUM  LIPASE, BLOOD  COMPREHENSIVE METABOLIC PANEL   ____________________________________________   EKG  Interpreted by me Atrial fibrillation with rapid ventricular response, heart rate 136. Normal axis. Normal intervals. Normal QRS ST segments and T waves. There is one PVC on the strip.  ____________________________________________    RADIOLOGY  Chest x-ray reveals infiltrate in the left base.  ____________________________________________   PROCEDURES CRITICAL CARE Performed by: Joni Fears,  Gracelyn Coventry   Total critical care time: 35 minutes  Critical care time was exclusive of separately billable procedures and treating other patients.  Critical care was necessary to treat or prevent imminent or life-threatening deterioration.  Critical care was time spent personally by me on the following activities: development of treatment plan with patient and/or surrogate as well as nursing, discussions with consultants, evaluation of patient's response to treatment, examination of patient, obtaining history from patient or surrogate, ordering and performing treatments and interventions, ordering and review of laboratory studies, ordering and review of radiographic studies, pulse oximetry and re-evaluation of patient's condition.   ____________________________________________   INITIAL IMPRESSION / ASSESSMENT AND PLAN / ED COURSE  Pertinent labs & imaging results that were available during my care of the patient were reviewed by me and considered in my medical decision making (see chart for details).  Patient presents with acute on chronic respiratory failure with severe hypoxia. Oxygenation is returned to appropriate adequate levels with 2 L nasal cannula. Also very tachycardic. Mental status and blood pressure appeared to be intact to the patient does not qualify as a code sepsis at this time. We will give some IV fluids for the heart rate while getting a workup. Family is highly informed and reports that they think his hypoxia is related to worsening of his breathing phenomenon (which to me sounds a bit like a central hypoventilation problem) due to some intermittent nasal cannula oxygen the patient received in the emergency department yesterday. I'm concerned the patient may have pneumonia    ----------------------------------------- 12:45 PM on 10/22/2015 -----------------------------------------  Heart rate improved with IV fluids. Rate controlled, 110-120. Oxygen saturation remains  severely low on room air, 91-93% on 2 L nasal cannula. Initial workup shows a leukocytosis of 21,000 and a chest x-ray consistent with an infiltrate. Given these findings, I ordered the patient IV ceftriaxone and azithromycin for the acquired pneumonia and sepsis at 11:30. We'll also check blood cultures and lactic acid. Because the patient has severe baseline respiratory failure and diastolic heart failure, a full 30 mL/kg bolus of IV fluids will be very likely to harm him and cause worsening recently failure resulting in positive pressure ventilation or intubation. Based on his blood pressure currently, large IV fluid boluses do not appear necessary. We'll follow-up the lactic acid to help quantify tissue perfusion. I discussed the case with the patient's cardiologist, Dr. Rockey Situ who agrees that gentle fluids after an initial bolus would likely be the best approach.  The present time were still waiting for lab results even though the patient has been here nearly 3 hours and then will proceed with admission.  ----------------------------------------- 1:06 PM on 10/22/2015 -----------------------------------------   Laboratory results obtained. No severe abnormalities requiring further intervention at this time. Case discussed with the hospitalist for admission.     ____________________________________________  FINAL CLINICAL IMPRESSION(S) / ED DIAGNOSES  Final diagnoses:  CAP (community acquired pneumonia)  Sepsis, due to unspecified organism [facility])  Acute respiratory failure with hypoxia ([place])  Atrial fibrillation with rapid ventricular response ([place])       Portions of this note were generated with dragon dictation software. Dictation errors may occur despite best attempts at proofreading.   Carrie Mew, MD 10/22/15 [phone]

## 2015-10-22 NOTE — Telephone Encounter (Signed)
Pt daughter calling stating pt is on the way to ED They are just letting us know so we may be able to check with attending doctor  To make sure everything goes accordingly for patient.  We know his history.

## 2015-10-22 NOTE — H&P (Signed)
[facility] NAME: Christian [name]    MR#:  [id number]  DATE OF BIRTH:  1941/05/24  DATE OF ADMISSION:  10/22/2015  PRIMARY CARE PHYSICIAN: [name], MD   REQUESTING/REFERRING PHYSICIAN: Dr. Brenton Grills  CHIEF COMPLAINT:   Chief Complaint  Patient presents with  . Nausea  . Emesis    HISTORY OF PRESENT ILLNESS:  Christian [name]  is a 75 y.o. male with a known history of Coronary artery disease status post bypass, diabetes, hypertension, hyperlipidemia, GERD, history of diastolic CHF, history of chronic atrial fibrillation, history of a hypoventilation syndrome, hepatic encephalopathy who presents to the hospital due to nausea and vomiting and also noted to be hypoxic. Patient presented to the emergency room yesterday due to back and neck pain and was given some IV Dilaudid here in the ER which improved his pain but he developed nausea and vomiting from it. he continued to have persistent nausea and vomiting yesterday and on through this morning and today his vomitus became blood-tinged and therefore his daughter brought him to the ER for further evaluation. In the emergency room patient still continues to have some spitting up at its nonbloody. He was noted to be hypoxic with O2 sats in the 70s. Hospitalist services were called in for treatment evaluation. Patient underwent a chest x-ray which showed possible left lower lobe infiltrate and he also had a leukocytosis.  PAST MEDICAL HISTORY:   Past Medical History  Diagnosis Date  . Coronary artery disease   . Diabetes mellitus   . Hyperlipidemia   . Hypertension   . GERD (gastroesophageal reflux disease)   . Arthritis   . Diastolic CHF, chronic ([place])   . Kidney stones   . Atrial fibrillation ([place])     DCCV 04/2010  . CO2 retention     during admission to [facility] 2013  . Acute renal failure ([place])   . Hepatic encephalopathy ([place])     2013  . Hypoventilation syndrome      probably neuromuscular disease-on NIPPV    PAST SURGICAL HISTORY:   Past Surgical History  Procedure Laterality Date  . Coronary artery bypass graft  2009    x 3 @ [facility]  . Cardiac catheterization  2009    showing patent grafts with elevated pulmonary pressures  . Colonoscopy  2011    negative per report  . Iron transfusion      SOCIAL HISTORY:   Social History  Substance Use Topics  . Smoking status: Former Smoker -- 1.50 packs/day for 30 years    Types: Cigarettes    Quit date: 07/11/1981  . Smokeless tobacco: Never Used  . Alcohol Use: No    FAMILY HISTORY:   Family History  Problem Relation Age of Onset  . Heart disease Brother   . Arthritis Mother   . Hypertension Mother   . Aneurysm Mother   . Heart disease Father   . Hyperlipidemia Father   . Stroke Father   . Diabetes Father   . Heart failure Father   . Heart disease Sister   . Colon cancer Neg Hx   . Prostate cancer Neg Hx   . Heart disease Brother     DRUG ALLERGIES:   Allergies  Allergen Reactions  . Ambien [Zolpidem Tartrate]     Over sedation  . Crestor [Rosuvastatin Calcium]     myalgias  . Other     Sedative or medications for sleep.  Marland Kitchen  Pradaxa [Dabigatran Etexilate Mesylate]     Inflammation in throat.  . Tramadol     Intolerant but not an allergy; sedation    REVIEW OF SYSTEMS:   Review of Systems  Constitutional: Negative for fever and weight loss.  HENT: Negative for congestion, nosebleeds and tinnitus.   Eyes: Negative for blurred vision, double vision and redness.  Respiratory: Negative for cough, hemoptysis and shortness of breath.   Cardiovascular: Negative for chest pain, orthopnea, leg swelling and PND.  Gastrointestinal: Positive for nausea and vomiting. Negative for abdominal pain, diarrhea and melena.  Genitourinary: Negative for dysuria, urgency and hematuria.  Musculoskeletal: Positive for back pain and neck pain. Negative for joint pain and falls.  Neurological:  Negative for dizziness, tingling, sensory change, focal weakness, seizures, weakness and headaches.  Endo/Heme/Allergies: Negative for polydipsia. Does not bruise/bleed easily.  Psychiatric/Behavioral: Negative for depression and memory loss. The patient is not nervous/anxious.     MEDICATIONS AT HOME:   Prior to Admission medications   Medication Sig Start Date End Date Taking? Authorizing Provider  acetaminophen (TYLENOL) 500 MG tablet Take 500 mg by mouth every 6 (six) hours as needed.    Historical Provider, MD  dronedarone (MULTAQ) 400 MG tablet Take 1 tablet (400 mg total) by mouth 2 (two) times daily with a meal. 04/07/15   Minna Merritts, MD  ipratropium (ATROVENT) 0.03 % nasal spray U 2 SPRAYS IEN 2-3 XD 04/15/15   Historical Provider, MD  menthol-cetylpyridinium (CEPACOL) 3 MG lozenge Take 1 lozenge by mouth as needed for sore throat.    Historical Provider, MD  metoprolol tartrate (LOPRESSOR) 25 MG tablet TAKE 1 TABLET BY MOUTH TWICE DAILY 01/23/15   Tonia Ghent, MD  Multiple Vitamins-Minerals (CENTRUM SILVER PO) Take by mouth daily.    Historical Provider, MD  nitroGLYCERIN (NITROSTAT) 0.4 MG SL tablet Place 0.4 mg under the tongue every 5 (five) minutes as needed.      Historical Provider, MD  NON FORMULARY Oxygen @ 2 liters at bedtime.    Historical Provider, MD  omeprazole (PRILOSEC) 20 MG capsule TAKE 1 CAPSULE BY MOUTH EVERY DAY 01/23/15   Tonia Ghent, MD  rivaroxaban (XARELTO) 20 MG TABS tablet Take 1 tablet (20 mg total) by mouth daily. 08/10/15   Minna Merritts, MD  spironolactone (ALDACTONE) 25 MG tablet Take 25 mg by mouth every other day.  01/31/13   Tonia Ghent, MD  tamsulosin (FLOMAX) 0.4 MG CAPS capsule TAKE 1 CAPSULE BY MOUTH EVERY EVENING AFTER DINNER 07/08/15   Tonia Ghent, MD  torsemide (DEMADEX) 20 MG tablet TAKE 1 TABLET BY MOUTH EVERY OTHER DAY 09/07/15   Tonia Ghent, MD  triamcinolone cream (KENALOG) 0.1 % APPLY EXTERNALLY TO THE AFFECTED AREA  TWICE DAILY 09/08/15   Tonia Ghent, MD      VITAL SIGNS:  Blood pressure 96/74, pulse 146, temperature 97.3 F (36.3 C), temperature source Oral, resp. rate 41, height 5\' 10"  (1.778 m), weight 87.544 kg (193 lb), SpO2 95 %.  PHYSICAL EXAMINATION:  Physical Exam  GENERAL:  75 y.o.-year-old patient lying in the bed in acute distress.  EYES: Pupils equal, round, reactive to light and accommodation. No scleral icterus. Extraocular muscles intact.  HEENT: Head atraumatic, normocephalic. Oropharynx and nasopharynx clear. No oropharyngeal erythema, moist oral mucosa  NECK:  Supple, no jugular venous distention. No thyroid enlargement, no tenderness.  LUNGS: Poor respiratory effort. Bibasilar crackles. No rhonchi/wheezing. Negative use of accessory muscles.  CARDIOVASCULAR:  S1, S2 irregular. No murmurs, rubs, gallops, clicks.  ABDOMEN: Soft, nontender, nondistended. Bowel sounds present. No organomegaly or mass.  EXTREMITIES: +2 pitting edema from the knees to ankles bilaterally, no cyanosis, or clubbing. + 2 pedal & radial pulses b/l.   NEUROLOGIC: Cranial nerves II through XII are intact. No focal Motor or sensory deficits appreciated b/l PSYCHIATRIC: The patient is alert and oriented x 3. Good affect.  SKIN: No obvious rash, lesion, or ulcer.   LABORATORY PANEL:   CBC  Recent Labs Lab 10/22/15 1017  WBC 21.3*  HGB 12.2*  HCT 39.2*  PLT 211   ------------------------------------------------------------------------------------------------------------------  Chemistries   Recent Labs Lab 10/22/15 1017  NA 135  K 4.6  CL 93*  CO2 36*  GLUCOSE 136*  BUN 28*  CREATININE 1.37*  CALCIUM 8.8*  MG 1.8  AST 22  ALT 13*  ALKPHOS 88  BILITOT 1.0   ------------------------------------------------------------------------------------------------------------------  Cardiac Enzymes  Recent Labs Lab 10/22/15 1017  TROPONINI 0.03    ------------------------------------------------------------------------------------------------------------------  RADIOLOGY:  Dg Chest 2 View  10/21/2015  CLINICAL DATA:  LEFT shoulder pain, shortness breath began today, coronary artery disease, diabetes mellitus, hypertension, atrial fibrillation, hepatic encephalopathy, street diastolic CHF, former smoker EXAM: CHEST  2 VIEW COMPARISON:  10/03/2012 FINDINGS: Enlargement of cardiac silhouette post CABG. Atherosclerotic calcification aorta. Lungs appear emphysematous with atelectasis at both lung bases. RIGHT apex obscured by patient's chin/face. No definite infiltrate, pleural effusion, or pneumothorax. Bones diffusely demineralized. No acute osseous abnormalities identified. IMPRESSION: Enlargement of cardiac silhouette post CABG. COPD changes with bibasilar atelectasis. Electronically Signed   By: Lavonia Dana M.D.   On: 10/21/2015 14:52   Ct Angio Neck W/cm &/or Wo/cm  10/21/2015  CLINICAL DATA:  Severe left-sided neck pain over the last 4 days. EXAM: CT ANGIOGRAPHY NECK TECHNIQUE: Multidetector CT imaging of the neck was performed using the standard protocol during bolus administration of intravenous contrast. Multiplanar CT image reconstructions and MIPs were obtained to evaluate the vascular anatomy. Carotid stenosis measurements (when applicable) are obtained utilizing NASCET criteria, using the distal internal carotid diameter as the denominator. CONTRAST:  100 mL Isovue 370 COMPARISON:  MRI of the cervical spine from the same day. FINDINGS: Aortic arch: Atherosclerotic calcifications are present at the aortic arch. There is no significant stenosis of the great vessels. Right carotid system: Atherosclerotic calcifications are present within the right common carotid artery. Calcifications are noted at the carotid bifurcation is well without a significant luminal stenosis relative to the more distal vessel. The cervical right ICA is within normal  limits to the skullbase. Left carotid system: Calcifications are present within the left common carotid artery. Calcified and noncalcified plaque is present at the left carotid bifurcation. There is no significant luminal stenosis relative to the more distal vessel. Focal calcification is present just below the left skullbase. This may be related to a remote injury or dissection. There is no acute dissection. The cervical left ICA is otherwise normal. Vertebral arteries:Dense calcifications are present at the origin of the right vertebral artery with a high-grade stenosis. The proximal left vertebral artery is tortuous, also with a high-grade stenosis. The vertebral arteries are codominant. There are calcifications at the C2 level on the left with a 50% narrowing. Calcifications are present at dural margin with mild narrowing bilaterally. The PICA origins are visualized and normal. Degenerative facet changes are again noted throughout the cervical spine. Skeleton: Degenerative facet changes are again noted throughout the cervical spine. Other neck: No focal mucosal  or submucosal lesions are present. The thyroid is unremarkable. There is no significant adenopathy. No focal hemorrhage or evidence for acute trauma is present. There is irregular thickening along the right major fissure, stable since 2013. Mild pleural thickening bilaterally is stable is well. IMPRESSION: 1. Atherosclerotic changes at the aortic arch, within the common carotid artery bilaterally and at both carotid bifurcations without significant focal carotid stenosis. 2. Calcification in the distal left common carotid artery may reflect remote dissection. No acute dissection is present. 3. 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 4. Mild narrowing of the vertebral arteries bilaterally at the dural margin. 5. Multilevel facet degenerative changes in the cervical  spine. 6. Multi focal pleural thickening is stable. Electronically Signed   By: San Morelle M.D.   On: 10/21/2015 19:04   Mr Cervical Spine Wo Contrast  10/21/2015  CLINICAL DATA:  Chronic neck pain with change 5 days ago radiating into the left shoulder. The examination had to be discontinued prior to completion due to patient refused further imaging. EXAM: MRI CERVICAL SPINE WITHOUT CONTRAST TECHNIQUE: Multiplanar, multisequence MR imaging of the cervical spine was performed. No intravenous contrast was administered. COMPARISON:  MRI of the brain 09/06/2011. FINDINGS: Normal signal is present in the cervical and upper thoracic spinal cord to the lowest imaged level, T1-2. Marrow signal, vertebral body heights, alignment are normal. The study is mildly degraded by patient motion. C2-3: Mild facet hypertrophy is present on the left without significant stenosis. C3-4: Facet hypertrophy is present bilaterally. There is no focal disc protrusion or stenosis. C4-5: Moderate facet hypertrophy is present bilaterally. There is no significant disc protrusion or stenosis. C5-6: Mild facet hypertrophy is present bilaterally. The central canal and foramina are patent. C6-7:  Negative. C7-T1:  Negative. IMPRESSION: 1. Multilevel facet degenerative changes the upper cervical spine from C2-3 through C5-6. 2. No focal disc protrusion or stenosis in the cervical spine. Electronically Signed   By: San Morelle M.D.   On: 10/21/2015 17:10   Dg Chest Portable 1 View  10/22/2015  CLINICAL DATA:  Vomited last night, coughing blood EXAM: PORTABLE CHEST 1 VIEW COMPARISON:  10/21/2015 FINDINGS: Cardiomediastinal silhouette is stable. Status post CABG. There is small left pleural effusion with hazy left basilar atelectasis or infiltrate. Mild right basilar atelectasis. Atherosclerotic calcifications of thoracic aorta again noted. No convincing pulmonary edema. IMPRESSION: Small left pleural effusion with hazy left  basilar atelectasis or infiltrate. Small right basilar atelectasis. No convincing pulmonary edema. Status post CABG. Electronically Signed   By: Lahoma Crocker M.D.   On: 10/22/2015 11:14     IMPRESSION AND PLAN:   75 year old male with past medical history of hypoventilation syndrome, coronary artery disease status post bypass, chronic atrial fibrillation, diastolic CHF, who presented to the hospital due to persistent nausea and vomiting and also noted to have some hematemesis and also noted to be Hypoxic.   1. Acute on chronic respiratory failure with hypoxia-suspected to be secondary to aspiration pneumonia. -Continue O2 supplementation, IV Zosyn. -Wean O2 as tolerated.  2. Pneumonia-suspected to be aspiration pneumonia given his persistent nausea and vomiting. -I will place him on IV Zosyn. Follow blood, sputum cultures.  3. Leukocytosis-secondary to pneumonia. Follow white cell count with IV antibiotic therapy.  4. Chronic afibrillation-patients rates are somewhat uncontrolled due to the respiratory distress. -I will place him on IV Cardizem as needed.  Continue metoprolol, Multaq.  -Hold Xarelto given the  hematemesis and possible upper GI bleed.  5. Upper GI bleed-patient presented to the hospital with hematemesis. His hemoglobin is stable. -He is on Xarelto which I will hold. I suspect this could be a Mallory-Weiss tear from his prolonged nausea and vomiting. -Follow hemoglobin, get a gastroenterology consult. Place him on Protonix BID  6. Hypoventilation syndrome-place him on minimal oxygen. I will get a arterial blood gas on room air and follow.  7. BPH-continue Flomax.  8. History of diastolic CHF-continue torsemide, Aldactone, metoprolol.    All the records are reviewed and case discussed with ED provider. Management plans discussed with the patient, family and they are in agreement.  CODE STATUS: Full code  TOTAL TIME TAKING CARE OF THIS PATIENT: 50 minutes.     Henreitta Leber M.D on 10/22/2015 at 2:00 PM  Between 7am to 6pm - Pager - [phone]  After 6pm go to [email or web address] - password [email or web address] [facility]  Office  [phone]  CC: Primary care physician; [name], MD

## 2015-10-23 ENCOUNTER — Encounter: Payer: Self-pay | Admitting: Nurse Practitioner

## 2015-10-23 DIAGNOSIS — J9601 Acute respiratory failure with hypoxia: Secondary | ICD-10-CM | POA: Insufficient documentation

## 2015-10-23 DIAGNOSIS — J189 Pneumonia, unspecified organism: Secondary | ICD-10-CM | POA: Insufficient documentation

## 2015-10-23 DIAGNOSIS — J9622 Acute and chronic respiratory failure with hypercapnia: Secondary | ICD-10-CM

## 2015-10-23 DIAGNOSIS — I4891 Unspecified atrial fibrillation: Secondary | ICD-10-CM | POA: Insufficient documentation

## 2015-10-23 DIAGNOSIS — E1159 Type 2 diabetes mellitus with other circulatory complications: Secondary | ICD-10-CM

## 2015-10-23 DIAGNOSIS — I110 Hypertensive heart disease with heart failure: Secondary | ICD-10-CM

## 2015-10-23 DIAGNOSIS — J9621 Acute and chronic respiratory failure with hypoxia: Secondary | ICD-10-CM

## 2015-10-23 DIAGNOSIS — E119 Type 2 diabetes mellitus without complications: Secondary | ICD-10-CM

## 2015-10-23 DIAGNOSIS — I119 Hypertensive heart disease without heart failure: Secondary | ICD-10-CM | POA: Diagnosis present

## 2015-10-23 DIAGNOSIS — A419 Sepsis, unspecified organism: Secondary | ICD-10-CM | POA: Insufficient documentation

## 2015-10-23 LAB — BASIC METABOLIC PANEL
ANION GAP: 5 (ref 5–15)
BUN: 29 mg/dL — AB (ref 6–20)
CO2: 36 mmol/L — ABNORMAL HIGH (ref 22–32)
Calcium: 8.8 mg/dL — ABNORMAL LOW (ref 8.9–10.3)
Chloride: 95 mmol/L — ABNORMAL LOW (ref 101–111)
Creatinine, Ser: 1.44 mg/dL — ABNORMAL HIGH (ref 0.61–1.24)
GFR, EST AFRICAN AMERICAN: 54 mL/min — AB (ref >60)
GFR, EST NON AFRICAN AMERICAN: 46 mL/min — AB (ref >60)
Glucose, Bld: 102 mg/dL — ABNORMAL HIGH (ref 65–99)
POTASSIUM: 4.5 mmol/L (ref 3.5–5.1)
Sodium: 136 mmol/L (ref 135–145)

## 2015-10-23 LAB — CBC
HEMATOCRIT: 38.7 % — AB (ref 40.0–52.0)
HEMOGLOBIN: 12.1 g/dL — AB (ref 13.0–18.0)
MCH: 28.3 pg (ref 26.0–34.0)
MCHC: 31.2 g/dL — AB (ref 32.0–36.0)
MCV: 90.9 fL (ref 80.0–100.0)
Platelets: 181 10*3/uL (ref 150–440)
RBC: 4.26 MIL/uL — ABNORMAL LOW (ref 4.40–5.90)
RDW: 14.1 % (ref 11.5–14.5)
WBC: 14.9 10*3/uL — ABNORMAL HIGH (ref 3.8–10.6)

## 2015-10-23 LAB — BLOOD CULTURE ID PANEL (REFLEXED)
ACINETOBACTER BAUMANNII: NOT DETECTED
CANDIDA ALBICANS: NOT DETECTED
CANDIDA TROPICALIS: NOT DETECTED
Candida glabrata: NOT DETECTED
Candida krusei: NOT DETECTED
Candida parapsilosis: NOT DETECTED
Carbapenem resistance: NOT DETECTED
ENTEROBACTER CLOACAE COMPLEX: NOT DETECTED
ENTEROCOCCUS SPECIES: NOT DETECTED
ESCHERICHIA COLI: NOT DETECTED
Enterobacteriaceae species: NOT DETECTED
HAEMOPHILUS INFLUENZAE: NOT DETECTED
Klebsiella oxytoca: NOT DETECTED
Klebsiella pneumoniae: NOT DETECTED
LISTERIA MONOCYTOGENES: NOT DETECTED
METHICILLIN RESISTANCE: DETECTED — AB
NEISSERIA MENINGITIDIS: NOT DETECTED
PROTEUS SPECIES: NOT DETECTED
Pseudomonas aeruginosa: NOT DETECTED
SERRATIA MARCESCENS: NOT DETECTED
STAPHYLOCOCCUS AUREUS BCID: NOT DETECTED
STREPTOCOCCUS PYOGENES: NOT DETECTED
STREPTOCOCCUS SPECIES: NOT DETECTED
Staphylococcus species: DETECTED — AB
Streptococcus agalactiae: NOT DETECTED
Streptococcus pneumoniae: NOT DETECTED
VANCOMYCIN RESISTANCE: NOT DETECTED

## 2015-10-23 NOTE — Progress Notes (Signed)
[facility] NAME: Christian [name]    MR#:  [id number]  DATE OF BIRTH:  21-Oct-1940  SUBJECTIVE:  CHIEF COMPLAINT:   Chief Complaint  Patient presents with  . Nausea  . Emesis   No further vomiting. Shortness of breath mildly worse than normal. No palpitations or lightheadedness. Has chronic lower extremity edema. Uses oxygen at night with his NIV  REVIEW OF SYSTEMS:    Review of Systems  Constitutional: Positive for malaise/fatigue. Negative for fever and chills.  HENT: Negative for sore throat.   Eyes: Negative for blurred vision, double vision and pain.  Respiratory: Positive for cough. Negative for hemoptysis, shortness of breath and wheezing.   Cardiovascular: Positive for chest pain. Negative for palpitations, orthopnea and leg swelling.  Gastrointestinal: Negative for heartburn, nausea, vomiting, abdominal pain, diarrhea and constipation.  Genitourinary: Negative for dysuria and hematuria.  Musculoskeletal: Negative for back pain and joint pain.  Skin: Negative for rash.  Neurological: Positive for weakness. Negative for sensory change, speech change, focal weakness and headaches.  Endo/Heme/Allergies: Does not bruise/bleed easily.  Psychiatric/Behavioral: Negative for depression. The patient is not nervous/anxious.     DRUG ALLERGIES:   Allergies  Allergen Reactions  . Ambien [Zolpidem Tartrate]     Over sedation  . Crestor [Rosuvastatin Calcium]     myalgias  . Other     Sedative or medications for sleep.  . Pradaxa [Dabigatran Etexilate Mesylate]     Inflammation in throat.  . Tramadol     Intolerant but not an allergy; sedation    VITALS:  Blood pressure 99/83, pulse 110, temperature 98.5 F (36.9 C), temperature source Axillary, resp. rate 25, height 5\' 10"  (1.778 m), weight 87.544 kg (193 lb), SpO2 87 %.  PHYSICAL EXAMINATION:   Physical Exam  GENERAL:  75 y.o.-year-old patient lying in the  bed with no acute distress.  EYES: Pupils equal, round, reactive to light and accommodation. No scleral icterus. Extraocular muscles intact.  HEENT: Head atraumatic, normocephalic. Oropharynx and nasopharynx clear.  NECK:  Supple, no jugular venous distention. No thyroid enlargement, no tenderness.  LUNGS: Normal breath sounds bilaterally, no wheezing, rales, rhonchi. No use of accessory muscles of respiration.  CARDIOVASCULAR: S1, S2 normal. No murmurs, rubs, or gallops.  ABDOMEN: Soft, nontender, nondistended. Bowel sounds present. No organomegaly or mass.  EXTREMITIES: No cyanosis, clubbing. 3+ lower extremity edema NEUROLOGIC: Cranial nerves II through XII are intact. No focal Motor or sensory deficits b/l.   PSYCHIATRIC: The patient is alert and oriented x 3.  SKIN: No obvious rash, lesion, or ulcer.   LABORATORY PANEL:   CBC  Recent Labs Lab 10/23/15 0545  WBC 14.9*  HGB 12.1*  HCT 38.7*  PLT 181   ------------------------------------------------------------------------------------------------------------------ Chemistries   Recent Labs Lab 10/22/15 1017 10/23/15 0545  NA 135 136  K 4.6 4.5  CL 93* 95*  CO2 36* 36*  GLUCOSE 136* 102*  BUN 28* 29*  CREATININE 1.37* 1.44*  CALCIUM 8.8* 8.8*  MG 1.8  --   AST 22  --   ALT 13*  --   ALKPHOS 88  --   BILITOT 1.0  --    ------------------------------------------------------------------------------------------------------------------  Cardiac Enzymes  Recent Labs Lab 10/22/15 1017  TROPONINI 0.03   ------------------------------------------------------------------------------------------------------------------  RADIOLOGY:  Dg Chest 2 View  10/21/2015  CLINICAL DATA:  LEFT shoulder pain, shortness breath began today, coronary artery disease, diabetes mellitus, hypertension, atrial fibrillation, hepatic encephalopathy, street diastolic CHF,  former smoker EXAM: CHEST  2 VIEW COMPARISON:  10/03/2012 FINDINGS:  Enlargement of cardiac silhouette post CABG. Atherosclerotic calcification aorta. Lungs appear emphysematous with atelectasis at both lung bases. RIGHT apex obscured by patient's chin/face. No definite infiltrate, pleural effusion, or pneumothorax. Bones diffusely demineralized. No acute osseous abnormalities identified. IMPRESSION: Enlargement of cardiac silhouette post CABG. COPD changes with bibasilar atelectasis. Electronically Signed   By: Lavonia Dana M.D.   On: 10/21/2015 14:52   Ct Angio Neck W/cm &/or Wo/cm  10/21/2015  CLINICAL DATA:  Severe left-sided neck pain over the last 4 days. EXAM: CT ANGIOGRAPHY NECK TECHNIQUE: Multidetector CT imaging of the neck was performed using the standard protocol during bolus administration of intravenous contrast. Multiplanar CT image reconstructions and MIPs were obtained to evaluate the vascular anatomy. Carotid stenosis measurements (when applicable) are obtained utilizing NASCET criteria, using the distal internal carotid diameter as the denominator. CONTRAST:  100 mL Isovue 370 COMPARISON:  MRI of the cervical spine from the same day. FINDINGS: Aortic arch: Atherosclerotic calcifications are present at the aortic arch. There is no significant stenosis of the great vessels. Right carotid system: Atherosclerotic calcifications are present within the right common carotid artery. Calcifications are noted at the carotid bifurcation is well without a significant luminal stenosis relative to the more distal vessel. The cervical right ICA is within normal limits to the skullbase. Left carotid system: Calcifications are present within the left common carotid artery. Calcified and noncalcified plaque is present at the left carotid bifurcation. There is no significant luminal stenosis relative to the more distal vessel. Focal calcification is present just below the left skullbase. This may be related to a remote injury or dissection. There is no acute dissection. The  cervical left ICA is otherwise normal. Vertebral arteries:Dense calcifications are present at the origin of the right vertebral artery with a high-grade stenosis. The proximal left vertebral artery is tortuous, also with a high-grade stenosis. The vertebral arteries are codominant. There are calcifications at the C2 level on the left with a 50% narrowing. Calcifications are present at dural margin with mild narrowing bilaterally. The PICA origins are visualized and normal. Degenerative facet changes are again noted throughout the cervical spine. Skeleton: Degenerative facet changes are again noted throughout the cervical spine. Other neck: No focal mucosal or submucosal lesions are present. The thyroid is unremarkable. There is no significant adenopathy. No focal hemorrhage or evidence for acute trauma is present. There is irregular thickening along the right major fissure, stable since 2013. Mild pleural thickening bilaterally is stable is well. IMPRESSION: 1. Atherosclerotic changes at the aortic arch, within the common carotid artery bilaterally and at both carotid bifurcations without significant focal carotid stenosis. 2. Calcification in the distal left common carotid artery may reflect remote dissection. No acute dissection is present. 3. 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 4. Mild narrowing of the vertebral arteries bilaterally at the dural margin. 5. Multilevel facet degenerative changes in the cervical spine. 6. Multi focal pleural thickening is stable. Electronically Signed   By: San Morelle M.D.   On: 10/21/2015 19:04   Mr Cervical Spine Wo Contrast  10/21/2015  CLINICAL DATA:  Chronic neck pain with change 5 days ago radiating into the left shoulder. The examination had to be discontinued prior to completion due to patient refused further imaging. EXAM: MRI CERVICAL SPINE WITHOUT CONTRAST TECHNIQUE:  Multiplanar, multisequence MR imaging of the cervical spine was  performed. No intravenous contrast was administered. COMPARISON:  MRI of the brain 09/06/2011. FINDINGS: Normal signal is present in the cervical and upper thoracic spinal cord to the lowest imaged level, T1-2. Marrow signal, vertebral body heights, alignment are normal. The study is mildly degraded by patient motion. C2-3: Mild facet hypertrophy is present on the left without significant stenosis. C3-4: Facet hypertrophy is present bilaterally. There is no focal disc protrusion or stenosis. C4-5: Moderate facet hypertrophy is present bilaterally. There is no significant disc protrusion or stenosis. C5-6: Mild facet hypertrophy is present bilaterally. The central canal and foramina are patent. C6-7:  Negative. C7-T1:  Negative. IMPRESSION: 1. Multilevel facet degenerative changes the upper cervical spine from C2-3 through C5-6. 2. No focal disc protrusion or stenosis in the cervical spine. Electronically Signed   By: San Morelle M.D.   On: 10/21/2015 17:10   Dg Chest Portable 1 View  10/22/2015  CLINICAL DATA:  Vomited last night, coughing blood EXAM: PORTABLE CHEST 1 VIEW COMPARISON:  10/21/2015 FINDINGS: Cardiomediastinal silhouette is stable. Status post CABG. There is small left pleural effusion with hazy left basilar atelectasis or infiltrate. Mild right basilar atelectasis. Atherosclerotic calcifications of thoracic aorta again noted. No convincing pulmonary edema. IMPRESSION: Small left pleural effusion with hazy left basilar atelectasis or infiltrate. Small right basilar atelectasis. No convincing pulmonary edema. Status post CABG. Electronically Signed   By: Lahoma Crocker M.D.   On: 10/22/2015 11:14     ASSESSMENT AND PLAN:   75 year old male with past medical history of hypoventilation syndrome, coronary artery disease status post bypass, chronic atrial fibrillation, diastolic CHF, who presented to the hospital due to persistent  nausea and vomiting and also noted to have some hematemesis and also noted to be Hypoxic.   # Acute on chronic respiratory failure with hypoxia -  secondary to aspiration pneumonia. -Continue O2 supplementation, IV Zosyn. -Wean O2 as tolerated. - Consult with Dr. Leonidas Romberg  # Chronic negative staph in blood cultures Likely contaminant. One out of 2 bottles.  # Chronic afibrillation-patients rates are somewhat uncontrolled due to the respiratory distress. -Hold Xarelto given the hematemesis and possible upper GI bleed. - Continue multi and metoprolol. - Discussed with cardiology Dr. Rockey Situ.  # Upper GI bleed No further bleeding. Hemoglobin stable. Likely Mallory-Weiss tear from vomiting. Appreciate GI help.  # BPH-continue Flomax.  # History of diastolic CHF continue torsemide, Aldactone, metoprolol.  All the records are reviewed and case discussed with Care Management/Social Workerr. Management plans discussed with the patient, family and they are in agreement.  CODE STATUS: FULL CODE  DVT Prophylaxis: SCDs  TOTAL TIME TAKING CARE OF THIS PATIENT: 35 minutes.   POSSIBLE D/C IN 1-2 DAYS, DEPENDING ON CLINICAL CONDITION.  Hillary Bow R M.D on 10/23/2015 at 12:59 PM  Between 7am to 6pm - Pager - [phone]  After 6pm go to [email or web address] - password [email or web address] [facility]  Office  [phone]  CC: Primary care physician; [name], MD  Note: This dictation was prepared with Dragon dictation along with smaller phrase technology. Any transcriptional errors that result from this process are unintentional.

## 2015-10-23 NOTE — Care Management (Signed)
Admitted to icu stepdown due to respiratory difficulty.  There are concerns for aspiration pneumonia. Patient has been in the progress of work up for neck and shoulder pain.  MRI show degenerative changes.  02 requirement is acute.  Current with his pcp [name] and no issues accessing medical care, obtaining medications, or transportation

## 2015-10-23 NOTE — Progress Notes (Signed)
Pharmacy Antibiotic Note  Christian [name] is a 75 y.o. male admitted on 10/22/2015 with aspiration pneumonia.  Pharmacy has been consulted for piperacillin/tazobactam dosing.  Plan: Zosyn 3.375g IV q8h (4 hour infusion).   4/14:  [facility] results called to Dr. Darvin Neighbours 1035. Per lab- Staph.Species, MecA positive in Anaerobic bottle only. Per MD, will repeat blood cultures, no new orders received.  Height: 5\' 10"  (177.8 cm) Weight: 193 lb (87.544 kg) IBW/kg (Calculated) : 73  Temp (24hrs), Avg:97.9 F (36.6 C), Min:97 F (36.1 C), Max:98.5 F (36.9 C)   Recent Labs Lab 10/21/15 1338 10/22/15 1017 10/22/15 1408 10/22/15 1841 10/23/15 0545  WBC 5.9 21.3*  --   --  14.9*  CREATININE 1.18 1.37*  --   --  1.44*  LATICACIDVEN  --   --  1.3 1.8  --     Estimated Creatinine Clearance: 46.5 mL/min (by C-G formula based on Cr of 1.44).    Allergies  Allergen Reactions  . Ambien [Zolpidem Tartrate]     Over sedation  . Crestor [Rosuvastatin Calcium]     myalgias  . Other     Sedative or medications for sleep.  . Pradaxa [Dabigatran Etexilate Mesylate]     Inflammation in throat.  . Tramadol     Intolerant but not an allergy; sedation   Antimicrobials this admission: Piperacillin/tazobactam 4/13 >>  CTX and azithromycin 1 dose in ED 4/13   Microbiology results: 4/13 BCx: Sent 4/13 MRSA PCR negative  Thank you for allowing pharmacy to be a part of this patient's care.  Noralee Space, PharmD Clinical Pharmacist 10/23/2015 10:39 AM

## 2015-10-23 NOTE — Progress Notes (Signed)
Cardiology Consult    Patient ID: [name] MRN: [id number], DOB/AGE: March 01, 1941   Admit date: 10/22/2015 Date of Consult: 10/23/2015  Primary Physician: [name], MD Primary Cardiologist: [name], MD  Requesting Provider: [name]  Patient Profile    75 y/o ? with a h/o CAD s/p CABG and permanent AFib who was admitted 4/13 2/2 persistent n/v and hematemesis and found to have PNA and resp failure.  Past Medical History   Past Medical History  Diagnosis Date  . Coronary artery disease     a. 2009 s/p CABG x 3; 09/2007 Cath: patent grafts.  . Type II diabetes mellitus ([name])   . Hyperlipidemia   . Hypertensive heart disease   . GERD (gastroesophageal reflux disease)   . Arthritis   . Diastolic CHF, chronic ([name])     a. 04/2012 Echo: EF >55%, no rwma, mod conc LVH, mod dil LA, Triv MR/TR.  [name]phrol[facility]iasis   . Chronic atrial fibrillation ([name])     a. DCCV 04/2010-->reverted to AF by 2012;  b. CHA2DS2VASc = 4->Xarelto.  . CO2 retention     during admission to [name] 2013  . CKD (chronic kidney disease), stage III   . Hepatic encephalopathy ([name])     2013  . 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 Coronary artery bypass graft  2009    x 3 @ [facility]  . Cardiac catheterization  2009    showing patent grafts with elevated pulmonary pressures  . Colonoscopy  2011    negative per report  . Iron transfusion       Allergies  Allergies  Allergen Reactions  . Ambien [Zolpidem Tartrate]     Over sedation  . Crestor [Rosuvastatin Calcium]     myalgias  . Other     Sedative or medications for sleep.  . Pradaxa [Dabigatran Etexilate Mesylate]     Inflammation in throat.  . Tramadol     Intolerant but not an allergy; sedation    History of Present Illness    75 y/o ? with a h/o CAD s/p CABG in 2009 with patent graft on f/u cath.  He also has a h/o HTN, HL, DM, and AFib s/p DCCV  in 2011 with subsequent reversion to AF in 2012.  He has been in chronic AF ever since.  He is maintained on dronedarone and xarelto @ home and has done well over the past few years.  In the early morning hours of 4/12, he presented to [name] ED with complaints of severe neck and left shoulder pain that awoke him from sleep on the morning of 4/11 and persisted even after seeing his chiropractor.  In the ED, CTA of the neck showed cervical disk dzs and bilat vertebral artery stenoses, and MRI, again showing degenerative cervical disk dzs w/o other acute findings.  He was treated with dilaudid resulting with improvement in pain and he was subsequently d/c'd from the ED in the late afternoon.  His dtr says that on the way home, he was sleepy and began c/o nausea and even vomited in the car.  Upon returning home, he continued to experience n, dry heaving, and vomiting.  As the evening progressed, his vomitus became blood tinged.  He re-presented to the ED late on 4/12.  On arrival, he was hypoxic.  CXR showed a left sided infiltrate and his WBC were elevated.  He  was admitted for mgmt of presumed aspiration pna and hematemesis - likely Mallory-Weiss tear.  In that setting, his HR's have been elevated into the 140-150 range.  He is asymptomatic.  Despite presentation, troponins are neg.  He currently says that he feels much better than last night.  He denies c/p or dyspnea @ rest.  Xarelto is currently on hold.  Inpatient Medications    . antiseptic oral rinse  7 mL Mouth Rinse BID  . dronedarone  400 mg Oral BID WC  . ipratropium  2 spray Each Nare TID  . metoprolol tartrate  25 mg Oral BID  . pantoprazole (PROTONIX) IV  40 mg Intravenous Q12H  . piperacillin-tazobactam (ZOSYN)  IV  3.375 g Intravenous 3 times per day  . spironolactone  25 mg Oral QODAY  . tamsulosin  0.4 mg Oral QPC supper  . torsemide  20 mg Oral QODAY    Family History    Family History  Problem Relation Age of Onset  . Heart disease  Brother   . Arthritis Mother   . Hypertension Mother   . Aneurysm Mother   . Heart disease Father   . Hyperlipidemia Father   . Stroke Father   . Diabetes Father   . Heart failure Father   . Heart disease Sister   . Colon cancer Neg Hx   . Prostate cancer Neg Hx   . Heart disease Brother     Social History    Social History   Social History  . Marital Status: Single    Spouse Name: N/A  . Number of Children: 2  . Years of Education: N/A   Occupational History  . Retired     [employer]   Social History Main Topics  . Smoking status: Former Smoker -- 1.50 packs/day for 30 years    Types: Cigarettes    Quit date: 07/11/1981  . Smokeless tobacco: Never Used  . Alcohol Use: No  . Drug Use: No  . Sexual Activity: Not on file   Other Topics Concern  . Not on file   Social History Narrative   Divorced 2000.  Lives in [place] by himself.   1 son, 1 daughter, both local   Retired from [employer]:  No chills, fever, night sweats or weight changes.  Cardiovascular:  No chest pain, dyspnea on exertion, edema, orthopnea, palpitations, paroxysmal nocturnal dyspnea. Dermatological: No rash, lesions/masses Respiratory: No cough, dyspnea Urologic: No hematuria, dysuria Abdominal:   +++ nausea, vomiting, hematemesis.  No diarrhea, bright red blood per rectum, melena, or hematemesis Neurologic:  No visual changes, wkns, changes in mental status. MSK: left neck pain. All other systems reviewed and are otherwise negative except as noted above.  Physical Exam    Blood pressure 104/80, pulse 122, temperature 98.5 F (36.9 C), temperature source Axillary, resp. rate 22, height 5\' 10"  (1.778 m), weight 193 lb (87.544 kg), SpO2 90 %.  General: Pleasant, NAD Psych: Flat affect. Neuro: Alert and oriented X 3. Moves all extremities spontaneously. HEENT: Normal except HOH.  Neck: Supple without bruits or JVD. Lungs:  Resp regular and  unlabored, diminished breath sounds bilat with bibasilar crackles. Heart: IR, IR, tachy. No s3, s4.  2/6 syst murmur noted @ LUSB. Abdomen: Soft, non-tender, non-distended, BS + x 4.  Extremities: No clubbing, cyanosis.  Trace bilat ankle edema. DP/PT/Radials 2+ and equal bilaterally.  Labs     Recent Labs  10/21/15 1338 10/21/15 2043 10/22/15 1017  TROPONINI <0.03 <0.03 0.03   Lab Results  Component Value Date   WBC 14.9* 10/23/2015   HGB 12.1* 10/23/2015   HCT 38.7* 10/23/2015   MCV 90.9 10/23/2015   PLT 181 10/23/2015     Recent Labs Lab 10/22/15 1017 10/23/15 0545  NA 135 136  K 4.6 4.5  CL 93* 95*  CO2 36* 36*  BUN 28* 29*  CREATININE 1.37* 1.44*  CALCIUM 8.8* 8.8*  PROT 6.4*  --   BILITOT 1.0  --   ALKPHOS 88  --   ALT 13*  --   AST 22  --   GLUCOSE 136* 102*   Lab Results  Component Value Date   CHOL 176 05/18/2015   HDL 51.70 05/18/2015   LDLCALC 112* 05/18/2015   TRIG 63.0 05/18/2015     Radiology Studies    Dg Chest 2 View  10/21/2015  CLINICAL DATA:  LEFT shoulder pain, shortness breath began today, coronary artery disease, diabetes mellitus, hypertension, atrial fibrillation, hepatic encephalopathy, street diastolic CHF, former smoker EXAM: CHEST  2 VIEW COMPARISON:  10/03/2012 FINDINGS: Enlargement of cardiac silhouette post CABG. Atherosclerotic calcification aorta. Lungs appear emphysematous with atelectasis at both lung bases. RIGHT apex obscured by patient's chin/face. No definite infiltrate, pleural effusion, or pneumothorax. Bones diffusely demineralized. No acute osseous abnormalities identified. IMPRESSION: Enlargement of cardiac silhouette post CABG. COPD changes with bibasilar atelectasis. Electronically Signed   By: Lavonia Dana M.D.   On: 10/21/2015 14:52   Ct Angio Neck W/cm &/or Wo/cm  10/21/2015  CLINICAL DATA:  Severe left-sided neck pain over the last 4 days. EXAM: CT ANGIOGRAPHY NECK TECHNIQUE: Multidetector CT imaging of the neck  was performed using the standard protocol during bolus administration of intravenous contrast. Multiplanar CT image reconstructions and MIPs were obtained to evaluate the vascular anatomy. Carotid stenosis measurements (when applicable) are obtained utilizing NASCET criteria, using the distal internal carotid diameter as the denominator. CONTRAST:  100 mL Isovue 370 COMPARISON:  MRI of the cervical spine from the same day. FINDINGS: Aortic arch: Atherosclerotic calcifications are present at the aortic arch. There is no significant stenosis of the great vessels. Right carotid system: Atherosclerotic calcifications are present within the right common carotid artery. Calcifications are noted at the carotid bifurcation is well without a significant luminal stenosis relative to the more distal vessel. The cervical right ICA is within normal limits to the skullbase. Left carotid system: Calcifications are present within the left common carotid artery. Calcified and noncalcified plaque is present at the left carotid bifurcation. There is no significant luminal stenosis relative to the more distal vessel. Focal calcification is present just below the left skullbase. This may be related to a remote injury or dissection. There is no acute dissection. The cervical left ICA is otherwise normal. Vertebral arteries:Dense calcifications are present at the origin of the right vertebral artery with a high-grade stenosis. The proximal left vertebral artery is tortuous, also with a high-grade stenosis. The vertebral arteries are codominant. There are calcifications at the C2 level on the left with a 50% narrowing. Calcifications are present at dural margin with mild narrowing bilaterally. The PICA origins are visualized and normal. Degenerative facet changes are again noted throughout the cervical spine. Skeleton: Degenerative facet changes are again noted throughout the cervical spine. Other neck: No focal mucosal or submucosal  lesions are present. The thyroid is unremarkable. There is no significant adenopathy. No focal hemorrhage or evidence for acute trauma  is present. There is irregular thickening along the right major fissure, stable since 2013. Mild pleural thickening bilaterally is stable is well. IMPRESSION: 1. Atherosclerotic changes at the aortic arch, within the common carotid artery bilaterally and at both carotid bifurcations without significant focal carotid stenosis. 2. Calcification in the distal left common carotid artery may reflect remote dissection. No acute dissection is present. 3. 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 4. Mild narrowing of the vertebral arteries bilaterally at the dural margin. 5. Multilevel facet degenerative changes in the cervical spine. 6. Multi focal pleural thickening is stable. Electronically Signed   By: San Morelle M.D.   On: 10/21/2015 19:04   Mr Cervical Spine Wo Contrast  10/21/2015  CLINICAL DATA:  Chronic neck pain with change 5 days ago radiating into the left shoulder. The examination had to be discontinued prior to completion due to patient refused further imaging. EXAM: MRI CERVICAL SPINE WITHOUT CONTRAST TECHNIQUE: Multiplanar, multisequence MR imaging of the cervical spine was performed. No intravenous contrast was administered. COMPARISON:  MRI of the brain 09/06/2011. FINDINGS: Normal signal is present in the cervical and upper thoracic spinal cord to the lowest imaged level, T1-2. Marrow signal, vertebral body heights, alignment are normal. The study is mildly degraded by patient motion. C2-3: Mild facet hypertrophy is present on the left without significant stenosis. C3-4: Facet hypertrophy is present bilaterally. There is no focal disc protrusion or stenosis. C4-5: Moderate facet hypertrophy is present bilaterally. There is no significant disc protrusion or stenosis. C5-6:  Mild facet hypertrophy is present bilaterally. The central canal and foramina are patent. C6-7:  Negative. C7-T1:  Negative. IMPRESSION: 1. Multilevel facet degenerative changes the upper cervical spine from C2-3 through C5-6. 2. No focal disc protrusion or stenosis in the cervical spine. Electronically Signed   By: San Morelle M.D.   On: 10/21/2015 17:10   Dg Chest Portable 1 View  10/22/2015  CLINICAL DATA:  Vomited last night, coughing blood EXAM: PORTABLE CHEST 1 VIEW COMPARISON:  10/21/2015 FINDINGS: Cardiomediastinal silhouette is stable. Status post CABG. There is small left pleural effusion with hazy left basilar atelectasis or infiltrate. Mild right basilar atelectasis. Atherosclerotic calcifications of thoracic aorta again noted. No convincing pulmonary edema. IMPRESSION: Small left pleural effusion with hazy left basilar atelectasis or infiltrate. Small right basilar atelectasis. No convincing pulmonary edema. Status post CABG. Electronically Signed   By: Lahoma Crocker M.D.   On: 10/22/2015 11:14    ECG & Cardiac Imaging    AF, 109, PVC's, no acute ST/T changes.  Assessment & Plan    1.  Acute on chronic respiratory failure/aspiration PNA:  Pt presented back to the ED on the evening of 4/12 secondary to n/v/hematemesis and was found to be hypoxic.  CXR showed L infiltrate and WBC were elevated.  He is on IV abx per IM/CCM for mgmt of presumed aspiration pna.  Currently stable on nasal cannula.  He is on chronic O2 @ night @ home.  2.  Hematemesis/upper GIB:  Resolved.  H/H stable.  Xarelto on hold in setting of presumed Mallory-Weiss tear.  3.  CAD:  H/o CAD s/p CABG with patent grafts on cath in 2009.  He has been doing well w/o chest pain @ home.  Despite above, along with rapid/chronic AF, his troponins are normal.  Cont  blocker.  No ASA in setting of chronic xarelto and #2.  No statin 2/2 intolerance.  No role for ischemic testing @ this time.  4.  Chronic Atrial  Fibrillation:  S/p DCCV in 2011 with recurrent AF in 2012  chronic and asymptomatic since.  Rates have been elevated since admission  likely driven by hyperadrenergic state and acute illness.  Cont  blocker and titrate as pressure allows. He is on dronedarone @ home for rate control only.  Xarelto currently on hold in setting of #2.  Plan to resume once ok with GI.  5.  Chronic Diastolic CHF: This has not been too much of an issue for him @ home.  Appears euvolemic today.  In setting of rapid rates, he will be more prone to fluid retentions.  Follow I/O and daily wts.  Cont  blocker, spiro, torsemide.   6.  Hypertensive Heart Dzs:  Stable.  7.  HL:  Statin intolerant.  8.  CKD III:  Creat up in setting of n/v.  Follow.  May need to hold diuretics for a day if no improvement.  Signed, Murray Hodgkins, NP 10/23/2015, 8:40 AM

## 2015-10-23 NOTE — Consult Note (Signed)
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 He has been on nocturnal noninvasive vent support for several years. He is presently followed by Dr Stevenson Clinch. He initially presented to the ED o4/12 with neck pain and was treated with opioid analgesics which caused significant nausea and vomiting. He then developed hematemesis and presented again to the to the ED. He was noted to be hypoxic (he does not usually wear O2 while awake) with a L basilar infiltrate and small effusion on CXR. PCCM asked to assist in mgmt of his acute on chronic hypercarbic and hypoxemia respiratory failure  Past Medical History  Diagnosis Date  . Coronary artery disease     a. 2009 s/p CABG x 3; 09/2007 Cath: patent grafts.  . Type II diabetes mellitus ([place])   . Hyperlipidemia   . Hypertensive heart disease   . GERD (gastroesophageal reflux disease)   . Arthritis   . Diastolic CHF, chronic ([place])     a. 04/2012 Echo: EF >55%, no rwma, mod conc LVH, mod dil LA, Triv MR/TR.  [facility] Nephrolithiasis   . Chronic atrial fibrillation ([place])     a. DCCV 04/2010-->reverted to AF by 2012;  b. CHA2DS2VASc = 4->Xarelto.  . CO2 retention     during admission to [facility] 2013  . CKD (chronic kidney disease), stage III   . Hepatic encephalopathy ([place])     2013  . 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 Coronary artery bypass graft  2009    x 3 @ [place]  . Cardiac catheterization  2009    showing patent grafts with elevated pulmonary pressures  . Colonoscopy  2011    negative per report  . Iron transfusion      MEDICATIONS: I have reviewed all  medications and confirmed regimen as documented  Social History   Social History  . Marital Status: Single    Spouse Name: N/A  . Number of Children: 2  . Years of Education: N/A   Occupational History  . Retired     [employer]   Social History Main Topics  . Smoking status: Former Smoker -- 1.50 packs/day for 30 years    Types: Cigarettes    Quit date: 07/11/1981  . Smokeless tobacco: Never Used  . Alcohol Use: No  . Drug Use: No  . Sexual Activity: Not on file   Other Topics Concern  . Not on file   Social History Narrative   Divorced 2000.  Lives in [place] by himself.   1 son, 1 daughter, both local   Retired from [employer]    Family History  Problem Relation Age of Onset  . Heart disease Brother   . Arthritis Mother   . Hypertension Mother   . Aneurysm Mother   . Heart disease Father   . Hyperlipidemia Father   . Stroke Father   . Diabetes Father   . Heart failure Father   . Heart disease Sister   . Colon cancer Neg Hx   . Prostate cancer Neg Hx   . Heart disease Brother     ROS: No unexplained weight loss or weight gain No new focal  weakness or sensory deficits No otalgia, hearing loss, visual changes, nasal and sinus symptoms, mouth and throat problems No adenopathy No abdominal pain, N/V/D, diarrhea, change in bowel pattern No dysuria, change in urinary pattern No LE edema or calf tenderness   Filed Vitals:   10/23/15 1338 10/23/15 1400 10/23/15 1413 10/23/15 1436  BP:  99/62    Pulse: 101 109 115 111  Temp:      TempSrc:      Resp: 28 27 26 25   Height:      Weight:      SpO2: 94% 88% 92% 94%     EXAM:  Gen: Chronically ill appearing, no overt distress, cognition intact HEENT: NCAT, sclera white, oropharynx normal Neck: Supple without LAN, thyromegaly, JVD Lungs: breath sounds mildly diminished, bibasilar crackles, no wheezes Cardiovascular: tachy, IRIR Abdomen: Soft, nontender, normal BS Ext: @-3+ symmetric LE  edema Neuro: CNs intact, diffusely weak without focal deficits   DATA:   BMP Latest Ref Rng 10/23/2015 10/22/2015 10/21/2015  Glucose 65 - 99 mg/dL 102(H) 136(H) 152(H)  BUN 6 - 20 mg/dL 29(H) 28(H) 20  Creatinine 0.61 - 1.24 mg/dL 1.44(H) 1.37(H) 1.18  Sodium 135 - 145 mmol/L 136 135 135  Potassium 3.5 - 5.1 mmol/L 4.5 4.6 4.4  Chloride 101 - 111 mmol/L 95(L) 93(L) 93(L)  CO2 22 - 32 mmol/L 36(H) 36(H) 37(H)  Calcium 8.9 - 10.3 mg/dL 8.8(L) 8.8(L) 9.2    CBC Latest Ref Rng 10/23/2015 10/22/2015 10/21/2015  WBC 3.8 - 10.6 K/uL 14.9(H) 21.3(H) 5.9  Hemoglobin 13.0 - 18.0 g/dL 12.1(L) 12.2(L) 12.7(L)  Hematocrit 40.0 - 52.0 % 38.7(L) 39.2(L) 39.9(L)  Platelets 150 - 440 K/uL 181 211 221    CXR:  L basilar opacity, suspect small effusion  IMPRESSION:   1) N/V, hematemesis - likely induced by opioids. Possible Mallory-Weiss tear 2) Chronic AF, now with RVR 3) Acute on chronic hypercarbic and hypoxemic resp failure due to NM dz and aspiration PNA 4) Severe LE edema - suspect cor pulmonale   PLAN:  GI and Cardiology following Cont Pip-tazo Continue Nocturnal noninvasive ventilation Would watch in SDU through today   [name], MD PCCM service Mobile [phone] Pager [phone] 10/23/2015

## 2015-10-23 NOTE — Plan of Care (Signed)
Problem: ICU Phase Progression Outcomes Goal: O2 sats trending toward baseline Outcome: Progressing Pt maintaining adequate saturation level on 1-2 L [place] during the day and throughout the night on his auto ventilator.

## 2015-10-23 NOTE — Consult Note (Signed)
GI Inpatient Consult Note  Reason for Consult: hematemesis   Attending Requesting Consult:  History of Present Illness: Christian [name] is a 75 y.o. male with hx of CAD, CHF, and Afib on xarelto who started vomiting after he took dilaudid for neck pain. After multiple bouts of vomiting, pt had small amount of blood per vomitus. No prior GI sxs until he took dilaudid. Now may have aspiration pneumonia. Hgb has stayed stable overnight.  Past Medical History:  Past Medical History  Diagnosis Date  . Coronary artery disease     a. 2009 s/p CABG x 3; 09/2007 Cath: patent grafts.  . Type II diabetes mellitus ([place])   . Hyperlipidemia   . Hypertensive heart disease   . GERD (gastroesophageal reflux disease)   . Arthritis   . Diastolic CHF, chronic ([place])     a. 04/2012 Echo: EF >55%, no rwma, mod conc LVH, mod dil LA, Triv MR/TR.  Marland Kitchen Nephrolithiasis   . Chronic atrial fibrillation ([place])     a. DCCV 04/2010-->reverted to AF by 2012;  b. CHA2DS2VASc = 4->Xarelto.  . CO2 retention     during admission to [facility] 2013  . CKD (chronic kidney disease), stage III   . Hepatic encephalopathy ([place])     2013  . Hypoventilation syndrome     probably neuromuscular disease-on NIPPV    Problem List: Patient Active Problem List   Diagnosis Date Noted  . Hypertensive heart disease   . Type II diabetes mellitus ([place])   . Pneumonia 10/22/2015  . Medicare annual wellness visit, initial 05/21/2015  . Advance care planning 05/21/2015  . 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 Fatigue 11/21/2014  . AK (actinic keratosis) 11/03/2014  . Benign paroxysmal positional vertigo 01/16/2014  . Orthostatic hypotension 04/03/2013  . 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 Anemia 06/19/2012  . Chronic diastolic heart failure ([place]) 05/11/2012  . Neck pain 01/30/2012  . Chronic respiratory failure ([place]) 01/17/2012  . Leg weakness 01/17/2012  . Hip pain 10/17/2011  . Ingrown toenail 10/17/2011  . Hypercarbia  10/14/2011  . Esophagitis 09/11/2011  . Dysphagia 07/21/2011  . Diastolic CHF, acute ([place]) 06/28/2011  . Weight loss, non-intentional 06/28/2011  . Mental confusion 06/28/2011  . Back pain 12/07/2010  . Hyperglycemia 12/07/2010  . LEFT VENTRICULAR FAILURE 05/19/2010  . EDEMA 04/12/2010  . SHORTNESS OF BREATH 03/18/2010  . Hyperlipidemia 03/03/2010  . HYPERTENSION, BENIGN 03/03/2010  . CAD (coronary artery disease) of artery bypass graft 03/03/2010  . ATRIAL FIBRILLATION 03/03/2010    Past Surgical History: Past Surgical History  Procedure Laterality Date  . Coronary artery bypass graft  2009    x 3 @ [facility]  . Cardiac catheterization  2009    showing patent grafts with elevated pulmonary pressures  . Colonoscopy  2011    negative per report  . Iron transfusion      Allergies: Allergies  Allergen Reactions  . Ambien [Zolpidem Tartrate]     Over sedation  . Crestor [Rosuvastatin Calcium]     myalgias  . Other     Sedative or medications for sleep.  . Pradaxa [Dabigatran Etexilate Mesylate]     Inflammation in throat.  . Tramadol     Intolerant but not an allergy; sedation    Home Medications: Prescriptions prior to admission  Medication Sig Dispense Refill Last Dose  . acetaminophen (TYLENOL) 500 MG tablet Take 500 mg by mouth every 6 (six) hours as needed.   prn at prn  . dronedarone (  MULTAQ) 400 MG tablet Take 1 tablet (400 mg total) by mouth 2 (two) times daily with a meal. 60 tablet 3 10/21/2015 at pm  . glucosamine-chondroitin 500-400 MG tablet Take 1 tablet by mouth daily.   10/21/2015 at Unknown time  . ipratropium (ATROVENT) 0.03 % nasal spray Place 2 sprays into both nostrils 3 (three) times daily.   10/21/2015 at Unknown time  . menthol-cetylpyridinium (CEPACOL) 3 MG lozenge Take 1 lozenge by mouth as needed for sore throat.   prn at prn  . metoprolol tartrate (LOPRESSOR) 25 MG tablet Take 25 mg by mouth 2 (two) times daily.   10/21/2015 at 2300  . Multiple  Vitamins-Minerals (CENTRUM SILVER PO) Take 1 tablet by mouth daily.    Past Month at Unknown time  . nitroGLYCERIN (NITROSTAT) 0.4 MG SL tablet Place 0.4 mg under the tongue every 5 (five) minutes as needed.     prn at prn  . NON FORMULARY Oxygen @ 2 liters at bedtime.   10/21/2015 at Unknown time  . omeprazole (PRILOSEC) 20 MG capsule Take 20 mg by mouth daily.   10/21/2015 at Unknown time  . potassium chloride (K-DUR,KLOR-CON) 10 MEQ tablet Take 10 mEq by mouth every other day.   10/20/2015 at am  . rivaroxaban (XARELTO) 20 MG TABS tablet Take 1 tablet (20 mg total) by mouth daily. 90 tablet 3 10/21/2015 at 2300  . spironolactone (ALDACTONE) 25 MG tablet Take 25 mg by mouth every other day.    10/20/2015  . tamsulosin (FLOMAX) 0.4 MG CAPS capsule Take 0.4 mg by mouth daily after supper.   10/21/2015 at Unknown time  . torsemide (DEMADEX) 20 MG tablet Take 20 mg by mouth every other day.   10/20/2015  . triamcinolone cream (KENALOG) 0.1 % Apply 1 application topically 2 (two) times daily as needed.   prn at prn   Home medication reconciliation was completed with the patient.   Scheduled Inpatient Medications:   . antiseptic oral rinse  7 mL Mouth Rinse BID  . dronedarone  400 mg Oral BID WC  . ipratropium  2 spray Each Nare TID  . metoprolol tartrate  25 mg Oral BID  . pantoprazole (PROTONIX) IV  40 mg Intravenous Q12H  . piperacillin-tazobactam (ZOSYN)  IV  3.375 g Intravenous 3 times per day  . spironolactone  25 mg Oral QODAY  . tamsulosin  0.4 mg Oral QPC supper  . torsemide  20 mg Oral QODAY    Continuous Inpatient Infusions:   . sodium chloride 75 mL/hr at 10/23/15 0934    PRN Inpatient Medications:  acetaminophen **OR** acetaminophen, diltiazem, nitroGLYCERIN, ondansetron **OR** ondansetron (ZOFRAN) IV  Family History: family history includes Aneurysm in his mother; Arthritis in his mother; Diabetes in his father; Heart disease in his brother, brother, father, and sister; Heart  failure in his father; Hyperlipidemia in his father; Hypertension in his mother; Stroke in his father. There is no history of Colon cancer or Prostate cancer.  The patient's family history is negative for inflammatory bowel disorders, GI malignancy, or solid organ transplantation.  Social History:   reports that he quit smoking about 34 years ago. His smoking use included Cigarettes. He has a 45 pack-year smoking history. He has never used smokeless tobacco. He reports that he does not drink alcohol or use illicit drugs. The patient denies ETOH, tobacco, or drug use.   Review of Systems: Constitutional: Weight is stable.  Eyes: No changes in vision. ENT: No oral lesions, sore  throat.  GI: see HPI.  Heme/Lymph: No easy bruising.  CV: No chest pain.  GU: No hematuria.  Integumentary: No rashes.  Neuro: No headaches.  Psych: No depression/anxiety.  Endocrine: No heat/cold intolerance.  Allergic/Immunologic: No urticaria.  Resp: No cough, SOB.  Musculoskeletal: No joint swelling.    Physical Examination: BP 97/78 mmHg  Pulse 130  Temp(Src) 98.5 F (36.9 C) (Axillary)  Resp 22  Ht 5\' 10"  (1.778 m)  Wt 87.544 kg (193 lb)  BMI 27.69 kg/m2  SpO2 96% Gen: NAD, alert and oriented x 4 HEENT: PEERLA, EOMI, Neck: supple, no JVD or thyromegaly Chest: CTA bilaterally, no wheezes, crackles, or other adventitious sounds CV: RRR, no m/g/c/r Abd: soft, NT, ND, +BS in all four quadrants; no HSM, guarding, ridigity, or rebound tenderness Ext: no edema, well perfused with 2+ pulses, Skin: no rash or lesions noted Lymph: no LAD  Data: Lab Results  Component Value Date   WBC 14.9* 10/23/2015   HGB 12.1* 10/23/2015   HCT 38.7* 10/23/2015   MCV 90.9 10/23/2015   PLT 181 10/23/2015    Recent Labs Lab 10/21/15 1338 10/22/15 1017 10/23/15 0545  HGB 12.7* 12.2* 12.1*   Lab Results  Component Value Date   NA 136 10/23/2015   K 4.5 10/23/2015   CL 95* 10/23/2015   CO2 36* 10/23/2015    BUN 29* 10/23/2015   CREATININE 1.44* 10/23/2015   Lab Results  Component Value Date   ALT 13* 10/22/2015   AST 22 10/22/2015   ALKPHOS 88 10/22/2015   BILITOT 1.0 10/22/2015    Recent Labs Lab 10/22/15 1048  INR 3.78   Assessment/Plan: Mr. Bleau is a 75 y.o. male with possible M-W tear from retching, exacerbated by xarelto. Pt stable from GI point of view.  Recommendations: No need for EGD since there is no active bleeding. Ok for daily PPI. Can start diet and advanced as tolerated. If no signs of further bleeding, ok to resume xarelto tomorrow. Will sign off.  Thank you for the consult. Please call with questions or concerns.  Kayton Dunaj, Lupita Dawn, MD

## 2015-10-24 ENCOUNTER — Inpatient Hospital Stay: Payer: Medicare HMO

## 2015-10-24 DIAGNOSIS — J690 Pneumonitis due to inhalation of food and vomit: Secondary | ICD-10-CM

## 2015-10-24 DIAGNOSIS — I4891 Unspecified atrial fibrillation: Secondary | ICD-10-CM

## 2015-10-24 DIAGNOSIS — J9601 Acute respiratory failure with hypoxia: Secondary | ICD-10-CM

## 2015-10-24 LAB — COMPREHENSIVE METABOLIC PANEL
ALBUMIN: 3 g/dL — AB (ref 3.5–5.0)
ALK PHOS: 73 U/L (ref 38–126)
ALT: 13 U/L — ABNORMAL LOW (ref 17–63)
ANION GAP: 4 — AB (ref 5–15)
AST: 15 U/L (ref 15–41)
BUN: 25 mg/dL — ABNORMAL HIGH (ref 6–20)
CHLORIDE: 92 mmol/L — AB (ref 101–111)
CO2: 36 mmol/L — AB (ref 22–32)
Calcium: 8.4 mg/dL — ABNORMAL LOW (ref 8.9–10.3)
Creatinine, Ser: 1.1 mg/dL (ref 0.61–1.24)
GFR calc non Af Amer: >60 mL/min (ref >60)
GLUCOSE: 106 mg/dL — AB (ref 65–99)
POTASSIUM: 4.4 mmol/L (ref 3.5–5.1)
SODIUM: 132 mmol/L — AB (ref 135–145)
Total Bilirubin: 0.6 mg/dL (ref 0.3–1.2)
Total Protein: 5.8 g/dL — ABNORMAL LOW (ref 6.5–8.1)

## 2015-10-24 LAB — BLOOD GAS, ARTERIAL
Acid-Base Excess: 9.5 mmol/L — ABNORMAL HIGH (ref 0.0–3.0)
Bicarbonate: 38.8 mEq/L — ABNORMAL HIGH (ref 21.0–28.0)
FIO2: 0.28
O2 SAT: 89.5 %
PATIENT TEMPERATURE: 37
PO2 ART: 63 mmHg — AB (ref 83.0–108.0)
pCO2 arterial: 77 mmHg (ref 32.0–48.0)
pH, Arterial: 7.31 — ABNORMAL LOW (ref 7.350–7.450)

## 2015-10-24 LAB — CBC WITH DIFFERENTIAL/PLATELET
BASOS PCT: 0 %
Basophils Absolute: 0 10*3/uL (ref 0–0.1)
EOS PCT: 1 %
Eosinophils Absolute: 0.1 10*3/uL (ref 0–0.7)
HEMATOCRIT: 37.4 % — AB (ref 40.0–52.0)
Hemoglobin: 11.7 g/dL — ABNORMAL LOW (ref 13.0–18.0)
Lymphocytes Relative: 4 %
Lymphs Abs: 0.4 10*3/uL — ABNORMAL LOW (ref 1.0–3.6)
MCH: 28.5 pg (ref 26.0–34.0)
MCHC: 31.2 g/dL — AB (ref 32.0–36.0)
MCV: 91.2 fL (ref 80.0–100.0)
MONO ABS: 0.6 10*3/uL (ref 0.2–1.0)
MONOS PCT: 6 %
NEUTROS ABS: 9.7 10*3/uL — AB (ref 1.4–6.5)
Neutrophils Relative %: 89 %
PLATELETS: 161 10*3/uL (ref 150–440)
RBC: 4.1 MIL/uL — ABNORMAL LOW (ref 4.40–5.90)
RDW: 14.2 % (ref 11.5–14.5)
WBC: 10.8 10*3/uL — ABNORMAL HIGH (ref 3.8–10.6)

## 2015-10-24 LAB — PHOSPHORUS: PHOSPHORUS: 2.7 mg/dL (ref 2.5–4.6)

## 2015-10-24 LAB — MAGNESIUM: Magnesium: 1.9 mg/dL (ref 1.7–2.4)

## 2015-10-24 LAB — GLUCOSE, CAPILLARY: GLUCOSE-CAPILLARY: 125 mg/dL — AB (ref 65–99)

## 2015-10-24 MED ORDER — AMIODARONE HCL IN DEXTROSE 360-4.14 MG/200ML-% IV SOLN
60.0000 mg/h | INTRAVENOUS | Status: DC
  Administered 2015-10-25: 30 mg/h via INTRAVENOUS
  Administered 2015-10-25 – 2015-10-26 (×2): 60 mg/h via INTRAVENOUS
  Filled 2015-10-24 (×7): qty 200

## 2015-10-24 MED ORDER — RIVAROXABAN 10 MG PO TABS: 20.0000 mg | ORAL_TABLET | Freq: Every day | ORAL | Status: DC

## 2015-10-24 MED ORDER — AMOXICILLIN-POT CLAVULANATE 875-125 MG PO TABS
1.0000 | ORAL_TABLET | Freq: Two times a day (BID) | ORAL | Status: DC
  Administered 2015-10-24 – 2015-10-27 (×7): 1 via ORAL
  Filled 2015-10-24 (×9): qty 1

## 2015-10-24 MED ORDER — RIVAROXABAN 20 MG PO TABS
20.0000 mg | ORAL_TABLET | Freq: Every day | ORAL | Status: DC
  Administered 2015-10-24 – 2015-10-26 (×3): 20 mg via ORAL
  Filled 2015-10-24: qty 2
  Filled 2015-10-24 (×2): qty 1

## 2015-10-24 MED ORDER — AMIODARONE LOAD VIA INFUSION
150.0000 mg | Freq: Once | INTRAVENOUS | Status: AC
  Administered 2015-10-24: 150 mg via INTRAVENOUS
  Filled 2015-10-24: qty 83.34

## 2015-10-24 MED ORDER — AMIODARONE HCL IN DEXTROSE 360-4.14 MG/200ML-% IV SOLN
60.0000 mg/h | INTRAVENOUS | Status: DC
Start: 2015-10-24 — End: 2015-10-26
  Administered 2015-10-25: 60 mg/h via INTRAVENOUS
  Filled 2015-10-24: qty 200

## 2015-10-24 NOTE — Progress Notes (Signed)
No response from 4:51pm page, paged once more, awaiting response. Charge nurse paged prime doc  at 5:20pm, awaiting response.

## 2015-10-24 NOTE — Progress Notes (Signed)
Report called to [facility]. Pt. Transferred to CCU 12

## 2015-10-24 NOTE — Progress Notes (Signed)
Patient: Christian [name] / Admit Date: 10/22/2015 / Date of Encounter: 10/24/2015, 11:27 AM   Subjective: Sleeping this morning, respiratory assist device in place, ABG this morning with hypercapnia, hypoxia Heart rate elevated up to 130 up to 150 Received diltiazem IV push 10 mg with improvement of heart rate. This has continued to stay in the 90-100 range Low blood pressure documented 89 systolic but nurse reports pressure has been stable around 100  Review of Systems: Review of Systems  Unable to perform ROS Bipap in place  Objective: Telemetry: atrial fib Physical Exam: Blood pressure 89/56, pulse 94, temperature 98.2 F (36.8 C), temperature source Oral, resp. rate 25, height 5\' 10"  (1.778 m), weight 193 lb (87.544 kg), SpO2 93 %. Body mass index is 27.69 kg/(m^2). General: Well developed, well nourished, in no acute distress. Head: Normocephalic, atraumatic, sclera non-icteric, no xanthomas, nares are without discharge. Neck: Negative for carotid bruits. JVP not elevated. Lungs: Clear bilaterally to auscultation without wheezes, rales, or rhonchi. Breathing is unlabored. Heart: RRR S1 S2 without murmurs, rubs, or gallops.  Abdomen: Soft, non-tender, non-distended with normoactive bowel sounds. No rebound/guarding. Extremities: No clubbing or cyanosis. No edema. Distal pedal pulses are 2+ and equal bilaterally. Neuro: Alert and oriented X 3. Moves all extremities spontaneously. Psych:  Responds to questions appropriately with a normal affect.   Intake/Output Summary (Last 24 hours) at 10/24/15 1127 Last data filed at 10/24/15 1052  Gross per 24 hour  Intake 2040.83 ml  Output    760 ml  Net 1280.83 ml    Inpatient Medications:  . antiseptic oral rinse  7 mL Mouth Rinse BID  . dronedarone  400 mg Oral BID WC  . ipratropium  2 spray Each Nare TID  . metoprolol tartrate  25 mg Oral BID  . pantoprazole (PROTONIX) IV  40 mg Intravenous Q12H  . piperacillin-tazobactam  (ZOSYN)  IV  3.375 g Intravenous 3 times per day  . rivaroxaban  20 mg Oral Daily  . spironolactone  25 mg Oral QODAY  . tamsulosin  0.4 mg Oral QPC supper  . torsemide  20 mg Oral QODAY   Infusions:    Labs:  Recent Labs  10/22/15 1017 10/23/15 0545 10/24/15 0553  NA 135 136 132*  K 4.6 4.5 4.4  CL 93* 95* 92*  CO2 36* 36* 36*  GLUCOSE 136* 102* 106*  BUN 28* 29* 25*  CREATININE 1.37* 1.44* 1.10  CALCIUM 8.8* 8.8* 8.4*  MG 1.8  --  1.9  PHOS 3.7  --  2.7    Recent Labs  10/22/15 1017 10/24/15 0553  AST 22 15  ALT 13* 13*  ALKPHOS 88 73  BILITOT 1.0 0.6  PROT 6.4* 5.8*  ALBUMIN 3.3* 3.0*    Recent Labs  10/22/15 1017 10/23/15 0545 10/24/15 0553  WBC 21.3* 14.9* 10.8*  NEUTROABS 19.5*  --  9.7*  HGB 12.2* 12.1* 11.7*  HCT 39.2* 38.7* 37.4*  MCV 91.0 90.9 91.2  PLT 211 181 161    Recent Labs  10/21/15 1338 10/21/15 2043 10/22/15 1017  TROPONINI <0.03 <0.03 0.03   Invalid input(s): POCBNP No results for input(s): HGBA1C in the last 72 hours.   Weights: Filed Weights   10/22/15 1005  Weight: 193 lb (87.544 kg)     Radiology/Studies:  Dg Chest 2 View  10/21/2015  CLINICAL DATA:  LEFT shoulder pain, shortness breath began today, coronary artery disease, diabetes mellitus, hypertension, atrial fibrillation, hepatic encephalopathy, street diastolic CHF,  former smoker EXAM: CHEST  2 VIEW COMPARISON:  10/03/2012 FINDINGS: Enlargement of cardiac silhouette post CABG. Atherosclerotic calcification aorta. Lungs appear emphysematous with atelectasis at both lung bases. RIGHT apex obscured by patient's chin/face. No definite infiltrate, pleural effusion, or pneumothorax. Bones diffusely demineralized. No acute osseous abnormalities identified. IMPRESSION: Enlargement of cardiac silhouette post CABG. COPD changes with bibasilar atelectasis. Electronically Signed   By: Lavonia Dana M.D.   On: 10/21/2015 14:52   Ct Angio Neck W/cm &/or Wo/cm  10/21/2015   CLINICAL DATA:  Severe left-sided neck pain over the last 4 days. EXAM: CT ANGIOGRAPHY NECK TECHNIQUE: Multidetector CT imaging of the neck was performed using the standard protocol during bolus administration of intravenous contrast. Multiplanar CT image reconstructions and MIPs were obtained to evaluate the vascular anatomy. Carotid stenosis measurements (when applicable) are obtained utilizing NASCET criteria, using the distal internal carotid diameter as the denominator. CONTRAST:  100 mL Isovue 370 COMPARISON:  MRI of the cervical spine from the same day. FINDINGS: Aortic arch: Atherosclerotic calcifications are present at the aortic arch. There is no significant stenosis of the great vessels. Right carotid system: Atherosclerotic calcifications are present within the right common carotid artery. Calcifications are noted at the carotid bifurcation is well without a significant luminal stenosis relative to the more distal vessel. The cervical right ICA is within normal limits to the skullbase. Left carotid system: Calcifications are present within the left common carotid artery. Calcified and noncalcified plaque is present at the left carotid bifurcation. There is no significant luminal stenosis relative to the more distal vessel. Focal calcification is present just below the left skullbase. This may be related to a remote injury or dissection. There is no acute dissection. The cervical left ICA is otherwise normal. Vertebral arteries:Dense calcifications are present at the origin of the right vertebral artery with a high-grade stenosis. The proximal left vertebral artery is tortuous, also with a high-grade stenosis. The vertebral arteries are codominant. There are calcifications at the C2 level on the left with a 50% narrowing. Calcifications are present at dural margin with mild narrowing bilaterally. The PICA origins are visualized and normal. Degenerative facet changes are again noted throughout the cervical  spine. Skeleton: Degenerative facet changes are again noted throughout the cervical spine. Other neck: No focal mucosal or submucosal lesions are present. The thyroid is unremarkable. There is no significant adenopathy. No focal hemorrhage or evidence for acute trauma is present. There is irregular thickening along the right major fissure, stable since 2013. Mild pleural thickening bilaterally is stable is well. IMPRESSION: 1. Atherosclerotic changes at the aortic arch, within the common carotid artery bilaterally and at both carotid bifurcations without significant focal carotid stenosis. 2. Calcification in the distal left common carotid artery may reflect remote dissection. No acute dissection is present. 3. 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 4. Mild narrowing of the vertebral arteries bilaterally at the dural margin. 5. Multilevel facet degenerative changes in the cervical spine. 6. Multi focal pleural thickening is stable. Electronically Signed   By: San Morelle M.D.   On: 10/21/2015 19:04   Mr Cervical Spine Wo Contrast  10/21/2015  CLINICAL DATA:  Chronic neck pain with change 5 days ago radiating into the left shoulder. The examination had to be discontinued prior to completion due to patient refused further imaging. EXAM: MRI CERVICAL SPINE WITHOUT CONTRAST TECHNIQUE: Multiplanar, multisequence MR imaging of the cervical spine was performed.  No intravenous contrast was administered. COMPARISON:  MRI of the brain 09/06/2011. FINDINGS: Normal signal is present in the cervical and upper thoracic spinal cord to the lowest imaged level, T1-2. Marrow signal, vertebral body heights, alignment are normal. The study is mildly degraded by patient motion. C2-3: Mild facet hypertrophy is present on the left without significant stenosis. C3-4: Facet hypertrophy is present bilaterally. There is no focal disc  protrusion or stenosis. C4-5: Moderate facet hypertrophy is present bilaterally. There is no significant disc protrusion or stenosis. C5-6: Mild facet hypertrophy is present bilaterally. The central canal and foramina are patent. C6-7:  Negative. C7-T1:  Negative. IMPRESSION: 1. Multilevel facet degenerative changes the upper cervical spine from C2-3 through C5-6. 2. No focal disc protrusion or stenosis in the cervical spine. Electronically Signed   By: San Morelle M.D.   On: 10/21/2015 17:10   Dg Chest Port 1 View  10/24/2015  CLINICAL DATA:  Acute respiratory failure. EXAM: PORTABLE CHEST 1 VIEW COMPARISON:  10/22/2015. FINDINGS: A poor inspiration is again is demonstrated with stable enlarged cardiac silhouette and post CABG changes. Stable prominence of the interstitial markings, mild patchy opacity at the left lung base and possible small left pleural effusion. Mild left shoulder degenerative changes. IMPRESSION: 1. Stable mild left basilar atelectasis or pneumonia and possible minimal left pleural effusion. 2. Stable cardiomegaly and mild chronic interstitial lung disease. Electronically Signed   By: Claudie Revering M.D.   On: 10/24/2015 07:20   Dg Chest Portable 1 View  10/22/2015  CLINICAL DATA:  Vomited last night, coughing blood EXAM: PORTABLE CHEST 1 VIEW COMPARISON:  10/21/2015 FINDINGS: Cardiomediastinal silhouette is stable. Status post CABG. There is small left pleural effusion with hazy left basilar atelectasis or infiltrate. Mild right basilar atelectasis. Atherosclerotic calcifications of thoracic aorta again noted. No convincing pulmonary edema. IMPRESSION: Small left pleural effusion with hazy left basilar atelectasis or infiltrate. Small right basilar atelectasis. No convincing pulmonary edema. Status post CABG. Electronically Signed   By: Lahoma Crocker M.D.   On: 10/22/2015 11:14     Assessment and Plan  75 y.o. male    ---->Acute on chronic hypoxic and hypercapnic respiratory  failure. Secondary to neuromuscular disease of some type Has been stable on noninvasive positive pressure ventilation at home Blood gas this morning with hypercapnia  ----> Atrial fibrillation with RVR Likely exacerbated by underlying acute lung issues, hypoxia, hypercapnia As outpatient, rate is well controlled -For now could start diltiazem 30 mg every 8 hours with hold parameters for systolic pressure less than 100  current outpatient medication regiment including multaq and metoprolol, xarelto  -----> chronic diastolic CHF  leg edema is chronic, component of venous insufficiency Echo does not suggest elevated right heart pressures -Would continue on regular outpatient diuretic regimen  ---->cad, cabg: Cardiac enzymes negative, he has been stable for many years Would continue beta blocker, statin, aspirin   Signed, Esmond Plants, MD, Ph.D. [facility] 10/24/2015, 11:27 AM

## 2015-10-24 NOTE — Progress Notes (Signed)
Dr Posey Pronto paged about patient's heart rate, waiting on callback.

## 2015-10-24 NOTE — Consult Note (Addendum)
PCCM PROGRESS NOTE     HPI:  No new complaints this morning, the patient's heart rate is elevated this morning at a rate of 140  ROS: The patient denies chest pain, PND, orthopnea, palpitations, the remainder of the review of systems was reviewed with this patient and was found to be negative.   Filed Vitals:   10/24/15 0700 10/24/15 0800 10/24/15 0900 10/24/15 1000  BP: 100/68 104/67  89/56  Pulse: 142 146  94  Temp:  98.2 F (36.8 C)    TempSrc:  Oral    Resp: 33 45  25  Height:      Weight:      SpO2: 93% 97% 95% 93%     EXAM:  Gen: Chronically ill appearing, no overt distress, cognition intact. The patient appears to be in good spirits this morning, he is conversational without dyspnea. HEENT: NCAT, sclera white, oropharynx normal Neck: Supple without LAN, thyromegaly, JVD Lungs: breath sounds mildly diminished, bibasilar crackles, decreased air entry in both lung bases. Cardiovascular: Fast heart rate at a rate of 140 bpm., IRIR Abdomen: Soft, nontender, normal BS Ext: @-3+ symmetric LE edema Neuro: CNs intact, diffusely weak without focal deficits   DATA:   BMP Latest Ref Rng 10/24/2015 10/23/2015 10/22/2015  Glucose 65 - 99 mg/dL 106(H) 102(H) 136(H)  BUN 6 - 20 mg/dL 25(H) 29(H) 28(H)  Creatinine 0.61 - 1.24 mg/dL 1.10 1.44(H) 1.37(H)  Sodium 135 - 145 mmol/L 132(L) 136 135  Potassium 3.5 - 5.1 mmol/L 4.4 4.5 4.6  Chloride 101 - 111 mmol/L 92(L) 95(L) 93(L)  CO2 22 - 32 mmol/L 36(H) 36(H) 36(H)  Calcium 8.9 - 10.3 mg/dL 8.4(L) 8.8(L) 8.8(L)    CBC Latest Ref Rng 10/24/2015 10/23/2015 10/22/2015  WBC 3.8 - 10.6 K/uL 10.8(H) 14.9(H) 21.3(H)  Hemoglobin 13.0 - 18.0 g/dL 11.7(L) 12.1(L) 12.2(L)  Hematocrit 40.0 - 52.0 % 37.4(L) 38.7(L) 39.2(L)  Platelets 150 - 440 K/uL 161 181 211    CXR:  4/15 reviewed, images today L basilar opacity, suspect small effusion, essentially minimally changed from yesterday.  Reviewed. RTO blood gas from 4/15:7.31/77/63/38.8;  consistent with acute on chronic hypercapnic respiratory failure.  IMPRESSION:   1) N/V, hematemesis - likely induced by opioids. Possible Mallory-Weiss tear 2) Chronic AF, now with RVR 3) Acute on chronic hypercarbic and hypoxemic resp failure due to NM dz and aspiration PNA 4) Severe LE edema - suspect cor pulmonale -Acute on chronic hypoxic and hypercapnic respiratory failure. -Severe baseline COPD/emphysema contributing to hypercapnia.   PLAN:  GI and Cardiology following Patient is given a dose of Cardizem IV push 10 mg 1. Continue multaq Continue Lopressor twice daily. Cont Pip-tazo Continue Nocturnal noninvasive ventilation    Deep Soha Thorup M.D. Pager [phone] 10/24/2015

## 2015-10-24 NOTE — Progress Notes (Deleted)
Nursing supervisor notified of bed request placed to go to ICU. States she needs to talk to patient placement to get patient transferred. Will continue to monitor patient.

## 2015-10-24 NOTE — Progress Notes (Signed)
[place] Progress Note Patient Name: Christian [name] DOB: [birth year] MRN: [id number]   Date of Service  10/24/2015  HPI/Events of Note  Acute on chronic hypercarbic and hypoxemic resp failure due to NM dz and aspiration PNA Tr back for AF-RVR  eICU Interventions  none     Intervention Category Evaluation Type: New Patient Evaluation  Romy Mcgue V. 10/24/2015, 8:20 PM

## 2015-10-24 NOTE — Progress Notes (Signed)
New order from Dr. Doy Hutching to transfer patient to ICU, notified patient and family

## 2015-10-24 NOTE — Progress Notes (Signed)
Nursing supervisor notified of bed request placed to go to ICU. States she needs to talk to patient placement to get patient transferred. Will continue to monitor patient.

## 2015-10-24 NOTE — Progress Notes (Signed)
Paged Fritzi Mandes r/t patient with 4 beats of vtach from central, awaiting response.

## 2015-10-24 NOTE — Progress Notes (Signed)
Spoke with Dr. Posey Pronto about drug durg interaction with xarelto and dronedarone (CYP3A4 mod inhib and p-glycoprotein). Increase concentration of xarelto, especially in reduce renal function (crcl <80). Both these meds are home medications. Pt is followed by Dr. Rockey Situ. GI ruled out bleeding. Per. Dr Fritzi Mandes, ok to resume Xarelto.  Ramond Dial, Pharm.D Clinical Pharmacist

## 2015-10-24 NOTE — Progress Notes (Signed)
[place] [facility] [place] NAME: Christian [name]    MR#:  [id number]  DATE OF BIRTH:  Aug 06, 1940  SUBJECTIVE:  No further vomiting. Shortness of breath mildly worse than normal. No palpitations or lightheadedness. Has chronic lower extremity edema. Uses oxygen at night with his NIV  REVIEW OF SYSTEMS:    Review of Systems  Constitutional: Positive for malaise/fatigue. Negative for fever and chills.  HENT: Negative for sore throat.   Eyes: Negative for blurred vision, double vision and pain.  Respiratory: Positive for cough. Negative for hemoptysis, shortness of breath and wheezing.   Cardiovascular: Positive for chest pain. Negative for palpitations, orthopnea and leg swelling.  Gastrointestinal: Negative for heartburn, nausea, vomiting, abdominal pain, diarrhea and constipation.  Genitourinary: Negative for dysuria and hematuria.  Musculoskeletal: Negative for back pain and joint pain.  Skin: Negative for rash.  Neurological: Positive for weakness. Negative for sensory change, speech change, focal weakness and headaches.  Endo/Heme/Allergies: Does not bruise/bleed easily.  Psychiatric/Behavioral: Negative for depression. The patient is not nervous/anxious.     DRUG ALLERGIES:   Allergies  Allergen Reactions  . Ambien [Zolpidem Tartrate]     Over sedation  . Crestor [Rosuvastatin Calcium]     myalgias  . Other     Sedative or medications for sleep.  . Pradaxa [Dabigatran Etexilate Mesylate]     Inflammation in throat.  . Tramadol     Intolerant but not an allergy; sedation    VITALS:  Blood pressure 106/63, pulse 46, temperature 98.2 F (36.8 C), temperature source Oral, resp. rate 18, height 5\' 10"  (1.778 m), weight 87.544 kg (193 lb), SpO2 95 %.  PHYSICAL EXAMINATION:   Physical Exam  GENERAL:  75 y.o.-year-old patient lying in the bed with no acute distress.  EYES: Pupils equal, round, reactive to light and  accommodation. No scleral icterus. Extraocular muscles intact.  HEENT: Head atraumatic, normocephalic. Oropharynx and nasopharynx clear.  NECK:  Supple, no jugular venous distention. No thyroid enlargement, no tenderness.  LUNGS: Normal breath sounds bilaterally, no wheezing, rales, rhonchi. No use of accessory muscles of respiration.  CARDIOVASCULAR: S1, S2 normal. No murmurs, rubs, or gallops.  ABDOMEN: Soft, nontender, nondistended. Bowel sounds present. No organomegaly or mass.  EXTREMITIES: No cyanosis, clubbing. 3+ lower extremity edema NEUROLOGIC: Cranial nerves II through XII are intact. No focal Motor or sensory deficits b/l.   PSYCHIATRIC: The patient is alert and oriented x 3.  SKIN: No obvious rash, lesion, or ulcer.   LABORATORY PANEL:   CBC  Recent Labs Lab 10/24/15 0553  WBC 10.8*  HGB 11.7*  HCT 37.4*  PLT 161   ------------------------------------------------------------------------------------------------------------------ Chemistries   Recent Labs Lab 10/24/15 0553  NA 132*  K 4.4  CL 92*  CO2 36*  GLUCOSE 106*  BUN 25*  CREATININE 1.10  CALCIUM 8.4*  MG 1.9  AST 15  ALT 13*  ALKPHOS 73  BILITOT 0.6   ------------------------------------------------------------------------------------------------------------------  Cardiac Enzymes  Recent Labs Lab 10/22/15 1017  TROPONINI 0.03   ------------------------------------------------------------------------------------------------------------------  RADIOLOGY:  Dg Chest Port 1 View  10/24/2015  CLINICAL DATA:  Acute respiratory failure. EXAM: PORTABLE CHEST 1 VIEW COMPARISON:  10/22/2015. FINDINGS: A poor inspiration is again is demonstrated with stable enlarged cardiac silhouette and post CABG changes. Stable prominence of the interstitial markings, mild patchy opacity at the left lung base and possible small left pleural effusion. Mild left shoulder degenerative changes. IMPRESSION: 1. Stable mild  left basilar atelectasis or  pneumonia and possible minimal left pleural effusion. 2. Stable cardiomegaly and mild chronic interstitial lung disease. Electronically Signed   By: Claudie Revering M.D.   On: 10/24/2015 07:20     ASSESSMENT AND PLAN:   75 year old male with past medical history of hypoventilation syndrome, coronary artery disease status post bypass, chronic atrial fibrillation, diastolic CHF, who presented to the hospital due to persistent nausea and vomiting and also noted to have some hematemesis and also noted to be Hypoxic.   # Acute on chronic respiratory failure with hypoxia -  secondary to aspiration pneumonia. -Continue O2 supplementation, IV Zosyn---change to po augmentin -WBC trending down to 10K, afebrile -Wean O2 as tolerated. - Consult with Dr. Leonidas Romberg appreciated  # Chronic negative staph in blood cultures Likely contaminant. One out of 2 bottles.  # Chronic afibrillation-patients rates are somewhat uncontrolled due to the respiratory distress. -resumed Xarelto ok with Dr Candace Cruise. No further Gi bleed - Continue multaq and metoprolol. - Discussed with cardiology Dr. Rockey Situ.  # Upper GI bleed No further bleeding. Hemoglobin stable. Likely Mallory-Weiss tear from vomiting. Appreciate GI help.  # BPH-continue Flomax.  # History of diastolic CHF continue torsemide, Aldactone, metoprolol.  All the records are reviewed and case discussed with Care Management/Social Workerr. Management plans discussed with the patient, family and they are in agreement.  CODE STATUS: FULL CODE  DVT Prophylaxis: SCDs  TOTAL TIME TAKING CARE OF THIS PATIENT: 30 minutes.   POSSIBLE D/C IN 1-2 DAYS, DEPENDING ON CLINICAL CONDITION.  Takeisha Cianci M.D on 10/24/2015 at 1:41 PM  Between 7am to 6pm - Pager - [phone]  After 6pm go to [email or web address] - password [email or web address] [facility]  Office  [phone]  CC: Primary care physician; [name], MD  Note: This  dictation was prepared with Dragon dictation along with smaller phrase technology. Any transcriptional errors that result from this process are unintentional.

## 2015-10-24 NOTE — Progress Notes (Signed)
No response from page at 4:40pm, paged once more, awaiting response

## 2015-10-24 NOTE — Progress Notes (Signed)
Called report to [facility]. Pt transferring to 1-42

## 2015-10-25 DIAGNOSIS — J9690 Respiratory failure, unspecified, unspecified whether with hypoxia or hypercapnia: Secondary | ICD-10-CM | POA: Insufficient documentation

## 2015-10-25 LAB — CBC
HEMATOCRIT: 35.3 % — AB (ref 40.0–52.0)
Hemoglobin: 11.2 g/dL — ABNORMAL LOW (ref 13.0–18.0)
MCH: 28.4 pg (ref 26.0–34.0)
MCHC: 31.8 g/dL — ABNORMAL LOW (ref 32.0–36.0)
MCV: 89.3 fL (ref 80.0–100.0)
PLATELETS: 175 10*3/uL (ref 150–440)
RBC: 3.96 MIL/uL — ABNORMAL LOW (ref 4.40–5.90)
RDW: 14.2 % (ref 11.5–14.5)
WBC: 9.4 10*3/uL (ref 3.8–10.6)

## 2015-10-25 LAB — BASIC METABOLIC PANEL
ANION GAP: 6 (ref 5–15)
BUN: 21 mg/dL — ABNORMAL HIGH (ref 6–20)
CALCIUM: 8.5 mg/dL — AB (ref 8.9–10.3)
CO2: 35 mmol/L — AB (ref 22–32)
Chloride: 93 mmol/L — ABNORMAL LOW (ref 101–111)
Creatinine, Ser: 1.11 mg/dL (ref 0.61–1.24)
Glucose, Bld: 109 mg/dL — ABNORMAL HIGH (ref 65–99)
Potassium: 4.1 mmol/L (ref 3.5–5.1)
Sodium: 134 mmol/L — ABNORMAL LOW (ref 135–145)

## 2015-10-25 LAB — GLUCOSE, CAPILLARY: Glucose-Capillary: 159 mg/dL — ABNORMAL HIGH (ref 65–99)

## 2015-10-25 LAB — MAGNESIUM: Magnesium: 1.8 mg/dL (ref 1.7–2.4)

## 2015-10-25 MED ORDER — FUROSEMIDE 10 MG/ML IJ SOLN
40.0000 mg | Freq: Every day | INTRAMUSCULAR | Status: DC
  Administered 2015-10-25 – 2015-10-27 (×3): 40 mg via INTRAVENOUS
  Filled 2015-10-25 (×3): qty 4

## 2015-10-25 MED ORDER — DIGOXIN 125 MCG PO TABS: 0.2500 mg | ORAL_TABLET | Freq: Every day | ORAL | Status: DC

## 2015-10-25 MED ORDER — DIGOXIN 0.25 MG/ML IJ SOLN
0.5000 mg | Freq: Once | INTRAMUSCULAR | Status: AC
  Administered 2015-10-25: 0.5 mg via INTRAVENOUS
  Filled 2015-10-25: qty 2

## 2015-10-25 MED ORDER — DILTIAZEM HCL 30 MG PO TABS
30.0000 mg | ORAL_TABLET | Freq: Four times a day (QID) | ORAL | Status: DC
  Administered 2015-10-25 – 2015-10-27 (×7): 30 mg via ORAL
  Filled 2015-10-25 (×9): qty 1

## 2015-10-25 NOTE — Progress Notes (Addendum)
PCCM PROGRESS NOTE     HPI:  No new complaints this morning, the patient's heart rate is elevated this morning at a rate of 140, was transferred from floor overnight due to Afib with RVR.   ROS: The patient denies chest pain, PND, orthopnea, palpitations, the remainder of the review of systems was reviewed with this patient and was found to be negative.   Filed Vitals:   10/25/15 0100 10/25/15 0200 10/25/15 0300 10/25/15 0400  BP: 104/72 91/67 105/71 106/71  Pulse: 42 32 104 111  Temp:    98.7 F (37.1 C)  TempSrc:    Oral  Resp: 33 33 31 31  Height:      Weight:      SpO2: 95% 92% 93% 93%     EXAM:  Gen: Pt currently on BIpap, no overt distress, cognition intact. The patient appears to be in good spirits this morning, he is conversational without dyspnea despite elevated HR. HEENT: NCAT, sclera white, oropharynx normal Neck: Supple without LAN, thyromegaly, JVD Lungs: breath sounds mildly diminished, bibasilar crackles, decreased air entry in both lung bases. Cardiovascular: Fast heart rate at a rate of 140 bpm., IRIR Abdomen: Soft, nontender, normal BS Ext: @-3+ symmetric LE edema Neuro: CNs intact, diffusely weak without focal deficits   DATA:   BMP Latest Ref Rng 10/25/2015 10/24/2015 10/23/2015  Glucose 65 - 99 mg/dL 109(H) 106(H) 102(H)  BUN 6 - 20 mg/dL 21(H) 25(H) 29(H)  Creatinine 0.61 - 1.24 mg/dL 1.11 1.10 1.44(H)  Sodium 135 - 145 mmol/L 134(L) 132(L) 136  Potassium 3.5 - 5.1 mmol/L 4.1 4.4 4.5  Chloride 101 - 111 mmol/L 93(L) 92(L) 95(L)  CO2 22 - 32 mmol/L 35(H) 36(H) 36(H)  Calcium 8.9 - 10.3 mg/dL 8.5(L) 8.4(L) 8.8(L)    CBC Latest Ref Rng 10/25/2015 10/24/2015 10/23/2015  WBC 3.8 - 10.6 K/uL 9.4 10.8(H) 14.9(H)  Hemoglobin 13.0 - 18.0 g/dL 11.2(L) 11.7(L) 12.1(L)  Hematocrit 40.0 - 52.0 % 35.3(L) 37.4(L) 38.7(L)  Platelets 150 - 440 K/uL 175 161 181    CXR:  4/15 reviewed, images today L basilar opacity, suspect small effusion, essentially minimally  changed from yesterday.  Reviewed. RTO blood gas from 4/15:7.31/77/63/38.8; consistent with acute on chronic hypercapnic respiratory failure.  IMPRESSION:   1) N/V, hematemesis - doing better.  2) Chronic AF, now with RVR 3) Acute on chronic hypercarbic and hypoxemic resp failure due to NM dz and aspiration PNA 4) Severe LE edema - suspect cor pulmonale -Acute on chronic hypoxic and hypercapnic respiratory failure. -Severe baseline hypoventilation from neuromuscular contributing to hypercapnia.   PLAN:  GI and Cardiology following Continue amiodarone, will await cards eval for further recs.  Continue multaq Continue Lopressor twice daily. Cont Pip-tazo Continue Nocturnal noninvasive ventilation    Deep Clea Dubach M.D. Pager [phone] 10/25/2015  Critical Care Attestation.  I have personally obtained a history, examined the patient, evaluated laboratory and imaging results, formulated the assessment and plan and placed orders. The Patient requires high complexity decision making for assessment and support, frequent evaluation and titration of therapies, application of advanced monitoring technologies and extensive interpretation of multiple databases. The patient has critical illness that could lead imminently to failure of 1 or more organ systems and requires the highest level of physician preparedness to intervene.  Critical Care Time devoted to patient care services described in this note is 35 minutes and is exclusive of time spent in procedures.

## 2015-10-25 NOTE — Progress Notes (Signed)
[name] [facility] [place] NAME: Christian [name]    MR#:  [id number]  DATE OF BIRTH:  1941-03-01  SUBJECTIVE:  Transferred back to ICU for rapi afib. Back on Amiodarone gtt Has chronic lower extremity edema. Uses oxygen at night with his NIV  REVIEW OF SYSTEMS:    Review of Systems  Constitutional: Positive for malaise/fatigue. Negative for fever and chills.  HENT: Negative for sore throat.   Eyes: Negative for blurred vision, double vision and pain.  Respiratory: Negative for cough, hemoptysis, shortness of breath and wheezing.   Cardiovascular: Positive for palpitations. Negative for chest pain, orthopnea and leg swelling.  Gastrointestinal: Negative for heartburn, nausea, vomiting, abdominal pain, diarrhea and constipation.  Genitourinary: Negative for dysuria and hematuria.  Musculoskeletal: Negative for back pain and joint pain.  Skin: Negative for rash.  Neurological: Positive for weakness. Negative for sensory change, speech change, focal weakness and headaches.  Endo/Heme/Allergies: Does not bruise/bleed easily.  Psychiatric/Behavioral: Negative for depression. The patient is not nervous/anxious.   All other systems reviewed and are negative.   DRUG ALLERGIES:   Allergies  Allergen Reactions  . Ambien [Zolpidem Tartrate]     Over sedation  . Crestor [Rosuvastatin Calcium]     myalgias  . Other     Sedative or medications for sleep.  . Pradaxa [Dabigatran Etexilate Mesylate]     Inflammation in throat.  . Tramadol     Intolerant but not an allergy; sedation    VITALS:  Blood pressure 106/71, pulse 111, temperature 98.7 F (37.1 C), temperature source Oral, resp. rate 31, height 5\' 10"  (1.778 m), weight 87.544 kg (193 lb), SpO2 93 %.  PHYSICAL EXAMINATION:   Physical Exam  GENERAL:  75 y.o.-year-old patient lying in the bed with no acute distress.  EYES: Pupils equal, round, reactive to light and accommodation. No  scleral icterus. Extraocular muscles intact.  HEENT: Head atraumatic, normocephalic. Oropharynx and nasopharynx clear. NIV+ NECK:  Supple, no jugular venous distention. No thyroid enlargement, no tenderness.  LUNGS: Normal breath sounds bilaterally, no wheezing, rales, rhonchi. No use of accessory muscles of respiration.  CARDIOVASCULAR: S1, S2 normal. No murmurs, rubs, or gallops. irregualry irregular ABDOMEN: Soft, nontender, nondistended. Bowel sounds present. No organomegaly or mass.  EXTREMITIES: No cyanosis, clubbing. 3+ lower extremity edema NEUROLOGIC: Cranial nerves II through XII are intact. No focal Motor or sensory deficits b/l.   PSYCHIATRIC: The patient is alert and oriented x 3.  SKIN: No obvious rash, lesion, or ulcer.   LABORATORY PANEL:   CBC  Recent Labs Lab 10/24/15 0553  WBC 10.8*  HGB 11.7*  HCT 37.4*  PLT 161   ------------------------------------------------------------------------------------------------------------------ Chemistries   Recent Labs Lab 10/24/15 0553  NA 132*  K 4.4  CL 92*  CO2 36*  GLUCOSE 106*  BUN 25*  CREATININE 1.10  CALCIUM 8.4*  MG 1.9  AST 15  ALT 13*  ALKPHOS 73  BILITOT 0.6   ------------------------------------------------------------------------------------------------------------------  Cardiac Enzymes  Recent Labs Lab 10/22/15 1017  TROPONINI 0.03   ------------------------------------------------------------------------------------------------------------------  RADIOLOGY:  Dg Chest Port 1 View  10/24/2015  CLINICAL DATA:  Acute respiratory failure. EXAM: PORTABLE CHEST 1 VIEW COMPARISON:  10/22/2015. FINDINGS: A poor inspiration is again is demonstrated with stable enlarged cardiac silhouette and post CABG changes. Stable prominence of the interstitial markings, mild patchy opacity at the left lung base and possible small left pleural effusion. Mild left shoulder degenerative changes. IMPRESSION: 1.  Stable mild left  basilar atelectasis or pneumonia and possible minimal left pleural effusion. 2. Stable cardiomegaly and mild chronic interstitial lung disease. Electronically Signed   By: Claudie Revering M.D.   On: 10/24/2015 07:20     ASSESSMENT AND PLAN:   76 year old male with past medical history of hypoventilation syndrome, coronary artery disease status post bypass, chronic atrial fibrillation, diastolic CHF, who presented to the hospital due to persistent nausea and vomiting and also noted to have some hematemesis and also noted to be Hypoxic.   # Acute on chronic respiratory failure with hypoxia -  secondary to aspiration pneumonia. -Continue O2 supplementation, IV Zosyn---change to po augmentin -WBC trending down to 10K, afebrile -Wean O2 as tolerated. - Consult with PCCM appreciated -pt on chronic NIV at home  # Chronic negative staph in blood cultures Likely contaminant. One out of 2 bottles.  # Acute on Chronic afibrillation-patients rates are somewhat uncontrolled due to the respiratory distress. -resumed Xarelto ok with Dr Candace Cruise. No further Gi bleed - His  multaq is on hold -cont metoprolol. -now back on IV Amidarone -  Cardiology f/u by Dr. Rockey Situ.  # Upper GI bleed No further bleeding. Hemoglobin stable. Likely Mallory-Weiss tear from vomiting. Appreciate GI help.  # BPH-continue Flomax.  # History of diastolic CHF continue torsemide, Aldactone, metoprolol.  All the records are reviewed and case discussed with Care Management/Social Workerr. Management plans discussed with the patient, family and they are in agreement.  CODE STATUS: FULL CODE  DVT Prophylaxis: SCDs  TOTAL critical TIME TAKING CARE OF THIS PATIENT: 30 minutes.   Mikya Don M.D on 10/25/2015 at 7:06 AM  Between 7am to 6pm - Pager - [phone]  After 6pm go to [email or web address] - password [email or web address] [facility]  Office  [phone]  CC: Primary care physician; [name],  MD  Note: This dictation was prepared with Dragon dictation along with smaller phrase technology. Any transcriptional errors that result from this process are unintentional.

## 2015-10-25 NOTE — Progress Notes (Addendum)
Patient: Christian [name] / Admit Date: 10/22/2015 / Date of Encounter: 10/25/2015, 12:59 PM   Subjective: Was on to a ventricular back to ICU for atrial fibrillation with RVR, rate up to 150 beats per minutes last night. Contacted by nursing, I recommended starting amiodarone with bolus then infusion Mild improvement in heart rate, this morning still running 130 bpm Chest x-ray yesterday showing left lower effusion or pneumonia, pulmonary edema Long discussion with family this morning, they have numerous concerns   Review of Systems: Review of Systems  Constitutional: Negative.   Respiratory: Positive for shortness of breath.   Cardiovascular: Positive for palpitations.  Gastrointestinal: Negative.   Musculoskeletal: Negative.   Neurological: Negative.   Psychiatric/Behavioral: Negative.   All other systems reviewed and are negative.   Objective: Telemetry: atrial fib rate 130s Physical Exam: Blood pressure 106/71, pulse 111, temperature 98.7 F (37.1 C), temperature source Oral, resp. rate 31, height 5\' 10"  (1.778 m), weight 193 lb (87.544 kg), SpO2 93 %. Body mass index is 27.69 kg/(m^2). General: Well developed, well nourished, in no acute distress. Head: Normocephalic, atraumatic, sclera non-icteric, no xanthomas, nares are without discharge. Neck: Negative for carotid bruits. JVP not elevated. Lungs: Scant rails, worse at the left base , Breathing is mildly labored. Heart: Tachycardic irregular rate and rhythm without murmurs, rubs, or gallops.  Abdomen: Soft, non-tender, non-distended with normoactive bowel sounds. No rebound/guarding. Extremities: No clubbing or cyanosis. No edema. Distal pedal pulses are 2+ and equal bilaterally. Neuro: Alert and oriented X 3. Moves all extremities spontaneously. Psych:  Responds to questions appropriately with a normal affect.   Intake/Output Summary (Last 24 hours) at 10/25/15 1259 Last data filed at 10/25/15 1257  Gross per 24  hour  Intake      0 ml  Output    930 ml  Net   -930 ml    Inpatient Medications:  . amoxicillin-clavulanate  1 tablet Oral Q12H  . antiseptic oral rinse  7 mL Mouth Rinse BID  . [START ON 10/26/2015] digoxin  0.25 mg Oral Daily  . diltiazem  30 mg Oral 4 times per day  . furosemide  40 mg Intravenous Daily  . ipratropium  2 spray Each Nare TID  . metoprolol tartrate  25 mg Oral BID  . pantoprazole (PROTONIX) IV  40 mg Intravenous Q12H  . rivaroxaban  20 mg Oral Q supper  . spironolactone  25 mg Oral QODAY  . tamsulosin  0.4 mg Oral QPC supper   Infusions:  . amiodarone 60 mg/hr (10/25/15 1213)    Labs:  Recent Labs  10/24/15 0553 10/25/15 0723  NA 132* 134*  K 4.4 4.1  CL 92* 93*  CO2 36* 35*  GLUCOSE 106* 109*  BUN 25* 21*  CREATININE 1.10 1.11  CALCIUM 8.4* 8.5*  MG 1.9 1.8  PHOS 2.7  --     Recent Labs  10/24/15 0553  AST 15  ALT 13*  ALKPHOS 73  BILITOT 0.6  PROT 5.8*  ALBUMIN 3.0*    Recent Labs  10/24/15 0553 10/25/15 0723  WBC 10.8* 9.4  NEUTROABS 9.7*  --   HGB 11.7* 11.2*  HCT 37.4* 35.3*  MCV 91.2 89.3  PLT 161 175   No results for input(s): CKTOTAL, CKMB, TROPONINI in the last 72 hours. Invalid input(s): POCBNP No results for input(s): HGBA1C in the last 72 hours.   Weights: Filed Weights   10/22/15 1005  Weight: 193 lb (87.544 kg)  Radiology/Studies:  Dg Chest 2 View  10/21/2015  CLINICAL DATA:  LEFT shoulder pain, shortness breath began today, coronary artery disease, diabetes mellitus, hypertension, atrial fibrillation, hepatic encephalopathy, street diastolic CHF, former smoker EXAM: CHEST  2 VIEW COMPARISON:  10/03/2012 FINDINGS: Enlargement of cardiac silhouette post CABG. Atherosclerotic calcification aorta. Lungs appear emphysematous with atelectasis at both lung bases. RIGHT apex obscured by patient's chin/face. No definite infiltrate, pleural effusion, or pneumothorax. Bones diffusely demineralized. No acute osseous  abnormalities identified. IMPRESSION: Enlargement of cardiac silhouette post CABG. COPD changes with bibasilar atelectasis. Electronically Signed   By: Lavonia Dana M.D.   On: 10/21/2015 14:52   Ct Angio Neck W/cm &/or Wo/cm  10/21/2015  IMPRESSION: 1. Atherosclerotic changes at the aortic arch, within the common carotid artery bilaterally and at both carotid bifurcations without significant focal carotid stenosis. 2. Calcification in the distal left common carotid artery may reflect remote dissection. No acute dissection is present. 3. 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 4. Mild narrowing of the vertebral arteries bilaterally at the dural margin. 5. Multilevel facet degenerative changes in the cervical spine. 6. Multi focal pleural thickening is stable. Electronically Signed   By: San Morelle M.D.   On: 10/21/2015 19:04   Mr Cervical Spine Wo Contrast  10/21/2015  IMPRESSION: 1. Multilevel facet degenerative changes the upper cervical spine from C2-3 through C5-6. 2. No focal disc protrusion or stenosis in the cervical spine. Electronically Signed   By: San Morelle M.D.   On: 10/21/2015 17:10   Dg Chest Port 1 View  10/24/2015  IMPRESSION: 1. Stable mild left basilar atelectasis or pneumonia and possible minimal left pleural effusion. 2. Stable cardiomegaly and mild chronic interstitial lung disease. Electronically Signed   By: Claudie Revering M.D.   On: 10/24/2015 07:20   Dg Chest Portable 1 View  10/22/2015   IMPRESSION: Small left pleural effusion with hazy left basilar atelectasis or infiltrate. Small right basilar atelectasis. No convincing pulmonary edema. Status post CABG. Electronically Signed   By: Lahoma Crocker M.D.   On: 10/22/2015 11:14     Assessment and Plan  75 y.o. male    ---->Acute on chronic hypoxic and hypercapnic respiratory failure. Secondary to neuromuscular disease of  some type Has been stable on noninvasive positive pressure ventilation at home Would continue positive pressure ventilation with napping, sleeping, and as tolerated. Chest x-ray worse yesterday, will give Lasix for possible CHF  ----> Atrial fibrillation with RVR Likely exacerbated by underlying acute lung issues, hypoxia, hypercapnia We will hold the multaq, Continue amiodarone infusion at high rate 1 mg/m, Order place to start digoxin this morning for rate control, Continue metoprolol, Placed order again for diltiazem 30 mg every 6 hours with hold parameters Continue xarelto Low Blood pressure limiting titration of medications  -----> chronic diastolic CHF  leg edema is chronic, component of venous insufficiency Echo does not suggest elevated right heart pressures Without said, x-ray yesterday looks worse, will give Lasix 40 mg IV, hold torsemide High risk of diastolic CHF in the setting of atrial fibrillation with RVR  ---->cad, cabg: Cardiac enzymes negative, he has been stable for many years Would continue beta blocker, statin, aspirin  Long discussion with family today concerning his hospital course, answered all their questions. There are frustrated by the slow progress, movement from one room to another, Drs. not knowing about his positive pressure ventilation unit If indicated they would  like to talk to critical care doctor   Total encounter time more than 35 minutes  Greater than 50% was spent in counseling and coordination of care with the patient    Signed, Esmond Plants, MD, Ph.D. [facility] 10/25/2015, 12:59 PM

## 2015-10-25 NOTE — Evaluation (Signed)
Physical Therapy Evaluation Patient Details Name: Christian [name] MRN: [id number] DOB: 1940/08/02 Today's Date: 10/25/2015   History of Present Illness  75 y.o. male with a known history of Coronary artery disease status post bypass, diabetes, hypertension, hyperlipidemia, GERD, history of diastolic CHF, history of chronic atrial fibrillation, history of a hypoventilation syndrome, hepatic encephalopathy who presents to the hospital due to nausea and vomiting and also noted to be hypoxic. Patient presented to the emergency room yesterday due to back and neck pain.    Clinical Impression  Pt is able to ambulate near the EOB with surprising confidence and performance.  He does have drop in O2 (at times into the low 80s) on 2 liters, but ultimately does not have excessive fatigue and has no balance issues.  Performed some exercises and standing/ambulation apart from examination and pt did do well with most aspects of PT despite his O2 and HR issues (>100 most of the time and increasing to high 120s near the end of session).    Follow Up Recommendations Home health PT    Equipment Recommendations  3in1 (PT)    Recommendations for Other Services       Precautions / Restrictions Precautions Precautions: Fall Restrictions Weight Bearing Restrictions: No      Mobility  Bed Mobility Overal bed mobility: Modified Independent             General bed mobility comments: Pt did well with getting to sitting at EOB.  CGA for getting LEs back into bed.  Transfers Overall transfer level: Needs assistance Equipment used: Rolling walker (2 wheeled) Transfers: Sit to/from Stand Sit to Stand: Min guard         General transfer comment: Pt needing cues to use UEs appropriately and for sequencing.  Ambulation/Gait Ambulation/Gait assistance: Min guard Ambulation Distance (Feet): 30 Feet Assistive device: Rolling walker (2 wheeled);1 person hand held assist       General Gait Details: Pt  was able to side step along EOB ~5 ft multiple times to each side and also one bout of forward and backward ambulation.  Some with walker, some with single HHA - pt has some drop in O2 but overall does relatively well and has no LOBs.   Stairs            Wheelchair Mobility    Modified Rankin (Stroke Patients Only)       Balance Overall balance assessment: Modified Independent                                           Pertinent Vitals/Pain Pain Assessment:  (reports pain is much more controlled)    Home Living Family/patient expects to be discharged to:: Private residence   Available Help at Discharge: Family (son lives 5 minutes away, daughter ~36) Type of Home:  (independent living apartment)         Home Equipment: Kasandra Knudsen - single point;Walker - 4 wheels      Prior Function Level of Independence: Independent with assistive device(s)         Comments: Pt regularly walks 300 ft(?) to a senior center.     Hand Dominance        Extremity/Trunk Assessment   Upper Extremity Assessment: Overall WFL for tasks assessed           Lower Extremity Assessment: Overall WFL for tasks assessed  Communication   Communication: HOH  Cognition Arousal/Alertness: Awake/alert Behavior During Therapy: WFL for tasks assessed/performed Overall Cognitive Status: Within Functional Limits for tasks assessed                      General Comments      Exercises General Exercises - Lower Extremity Hip Flexion/Marching: 10 reps;Both;AROM Other Exercises Other Exercises: static standing marching with b/l LEs      Assessment/Plan    PT Assessment Patient needs continued PT services  PT Diagnosis Difficulty walking;Generalized weakness   PT Problem List Decreased strength;Decreased range of motion;Decreased activity tolerance;Decreased balance;Decreased mobility;Decreased safety awareness;Cardiopulmonary status limiting  activity;Pain  PT Treatment Interventions DME instruction;Gait training;Functional mobility training;Therapeutic activities;Therapeutic exercise;Balance training;Neuromuscular re-education;Patient/family education   PT Goals (Current goals can be found in the Care Plan section) Acute Rehab PT Goals Patient Stated Goal: Go home PT Goal Formulation: With patient/family Time For Goal Achievement: 11/08/15 Potential to Achieve Goals: Fair    Frequency Min 2X/week   Barriers to discharge        Co-evaluation               End of Session Equipment Utilized During Treatment: Oxygen (2 liters) Activity Tolerance: Patient limited by fatigue;Patient tolerated treatment well Patient left: in bed;with nursing/sitter in room;with family/visitor present Nurse Communication: Mobility status         Time: 1325-1401 PT Time Calculation (min) (ACUTE ONLY): 36 min   Charges:   PT Evaluation $PT Eval Moderate Complexity: 1 Procedure PT Treatments $Therapeutic Exercise: 8-22 mins   PT G Codes:       Wayne Both, PT, DPT [phone]  Kreg Shropshire 10/25/2015, 3:01 PM

## 2015-10-26 DIAGNOSIS — J9600 Acute respiratory failure, unspecified whether with hypoxia or hypercapnia: Secondary | ICD-10-CM

## 2015-10-26 LAB — BASIC METABOLIC PANEL
ANION GAP: 3 — AB (ref 5–15)
BUN: 17 mg/dL (ref 6–20)
CHLORIDE: 90 mmol/L — AB (ref 101–111)
CO2: 41 mmol/L — AB (ref 22–32)
Calcium: 8.4 mg/dL — ABNORMAL LOW (ref 8.9–10.3)
Creatinine, Ser: 1 mg/dL (ref 0.61–1.24)
GFR calc non Af Amer: >60 mL/min (ref >60)
Glucose, Bld: 108 mg/dL — ABNORMAL HIGH (ref 65–99)
POTASSIUM: 3.8 mmol/L (ref 3.5–5.1)
SODIUM: 134 mmol/L — AB (ref 135–145)

## 2015-10-26 LAB — CBC
HEMATOCRIT: 35.5 % — AB (ref 40.0–52.0)
HEMOGLOBIN: 11.5 g/dL — AB (ref 13.0–18.0)
MCH: 28.2 pg (ref 26.0–34.0)
MCHC: 32.3 g/dL (ref 32.0–36.0)
MCV: 87.5 fL (ref 80.0–100.0)
Platelets: 186 10*3/uL (ref 150–440)
RBC: 4.06 MIL/uL — AB (ref 4.40–5.90)
RDW: 14.1 % (ref 11.5–14.5)
WBC: 7.1 10*3/uL (ref 3.8–10.6)

## 2015-10-26 LAB — DIGOXIN LEVEL: DIGOXIN LVL: 1.3 ng/mL (ref 0.8–2.0)

## 2015-10-26 MED ORDER — AMIODARONE HCL 200 MG PO TABS: 400.0000 mg | ORAL_TABLET | Freq: Two times a day (BID) | ORAL | Status: DC

## 2015-10-26 MED ORDER — DIGOXIN 125 MCG PO TABS
0.1250 mg | ORAL_TABLET | Freq: Every day | ORAL | Status: DC
  Administered 2015-10-26 – 2015-10-27 (×2): 0.125 mg via ORAL
  Filled 2015-10-26 (×2): qty 1

## 2015-10-26 NOTE — Care Management Note (Addendum)
Case Management Note  Patient Details  Name: JARED SNEDEN MRN: [id number] Date of Birth: 1941-04-28  Subjective/Objective:       75yo Mr Travonn Alexis was admitted 10/22/15 with PNA. Resides at home alone but daughter and son are very supportive and assist with transportation, groceries, etc. Hx: Chronic A-Fib on [place]. CHF, HOH. PCP=Graham Duncan. Pharmacy=[facility] on [address]. On chronic 2L N/C at home via oxygen concentrator, and at night uses a Non-Invasive Auto Ventilator which son reports is supplied by [facility].  Floydene Flock updated that son would like to speak to him. Has a rolling walker and a cane. Son would like a BSC. Son chose [facility] if home health is ordered. Case management will follow for discharge planning.             Action/Plan:   Expected Discharge Date:                  Expected Discharge Plan:     In-House Referral:     Discharge planning Services     Post Acute Care Choice:    Choice offered to:     DME Arranged:    DME Agency:     HH Arranged:    [facility] Agency:     Status of Service:     Medicare Important Message Given:    Date Medicare IM Given:    Medicare IM give by:    Date Additional Medicare IM Given:    Additional Medicare Important Message give by:     If discussed at [place] of Stay Meetings, dates discussed:    Additional Comments:  Lakima Dona A, RN 10/26/2015, 2:55 PM

## 2015-10-26 NOTE — Progress Notes (Signed)
PCCM PROGRESS NOTE     HPI:  No new complaints this morning, the patient's heart rate is elevated this morning at a rate of 140, was transferred from floor  due to Afib with RVR.  resp status is stable, wears biPAP for OSA   ROS: The patient denies chest pain, PND, orthopnea, palpitations, the remainder of the review of systems was reviewed with this patient and was found to be negative.   Filed Vitals:   10/26/15 0700 10/26/15 0800 10/26/15 0810 10/26/15 0814  BP: 120/61 125/63    Pulse: 90 101  98  Temp:    98.3 F (36.8 C)  TempSrc:    Oral  Resp: 33 28  34  Height:      Weight:      SpO2: 92% 94% 95% 92%     EXAM:  Gen: Pt currently on BIpap, no overt distress, cognition intact. The patient appears to be in good spirits this morning, he is conversational without dyspnea despite elevated HR. HEENT: NCAT, sclera white, oropharynx normal Neck: Supple without LAN, thyromegaly, JVD Lungs: breath sounds mildly diminished, bibasilar crackles, decreased air entry in both lung bases. Cardiovascular: Fast heart rate at a rate of 140 bpm., IRIR Abdomen: Soft, nontender, normal BS Ext: @-3+ symmetric LE edema Neuro: CNs intact, diffusely weak without focal deficits   DATA:   BMP Latest Ref Rng 10/26/2015 10/25/2015 10/24/2015  Glucose 65 - 99 mg/dL 108(H) 109(H) 106(H)  BUN 6 - 20 mg/dL 17 21(H) 25(H)  Creatinine 0.61 - 1.24 mg/dL 1.00 1.11 1.10  Sodium 135 - 145 mmol/L 134(L) 134(L) 132(L)  Potassium 3.5 - 5.1 mmol/L 3.8 4.1 4.4  Chloride 101 - 111 mmol/L 90(L) 93(L) 92(L)  CO2 22 - 32 mmol/L 41(H) 35(H) 36(H)  Calcium 8.9 - 10.3 mg/dL 8.4(L) 8.5(L) 8.4(L)    CBC Latest Ref Rng 10/26/2015 10/25/2015 10/24/2015  WBC 3.8 - 10.6 K/uL 7.1 9.4 10.8(H)  Hemoglobin 13.0 - 18.0 g/dL 11.5(L) 11.2(L) 11.7(L)  Hematocrit 40.0 - 52.0 % 35.5(L) 35.3(L) 37.4(L)  Platelets 150 - 440 K/uL 186 175 161    CXR:  4/15 reviewed, images today L basilar opacity, suspect small effusion,  essentially minimally changed from yesterday.  Reviewed. RTO blood gas from 4/15:7.31/77/63/38.8; consistent with acute on chronic hypercapnic respiratory failure.  IMPRESSION:   1) N/V, hematemesis - doing better.  2) Chronic AF, now with RVR 3) Acute on chronic hypercarbic and hypoxemic resp failure due to NM dz and aspiration PNA 4) Severe LE edema - suspect cor pulmonale -Acute on chronic hypoxic and hypercapnic respiratory failure. -Severe baseline hypoventilation from neuromuscular contributing to hypercapnia.   PLAN:  GI and Cardiology following Continue amiodarone, will await cards eval for further recs.  Continue Lopressor twice daily. Cont Pip-tazo Continue Nocturnal noninvasive ventilation  OK to transfer to 2A if floor can take amiodarone infusion  I have personally obtained a history, examined the patient, evaluated Pertinent laboratory and RadioGraphic/imaging results, and  formulated the assessment and plan   The Patient requires high complexity decision making for assessment and support, frequent evaluation and titration of therapies.   Patient/Family are satisfied with Plan of action and management. All questions answered  Corrin Parker, M.D.  [facility]  Medical Director [place] Director [facility] Cardio-Pulmonary Department

## 2015-10-26 NOTE — Progress Notes (Signed)
[facility] [place] NAME: Christian [name]    MR#:  [id number]  DATE OF BIRTH:  11-Mar-1941  SUBJECTIVE:  Mild SOB. Dry cough. Feels weak. HR around 100 on amiodarone drip. Has chronic lower extremity edema. Uses oxygen at night with his NIV at baseline. Afebrile  REVIEW OF SYSTEMS:    Review of Systems  Constitutional: Positive for malaise/fatigue. Negative for fever and chills.  HENT: Negative for sore throat.   Eyes: Negative for blurred vision, double vision and pain.  Respiratory: Negative for cough, hemoptysis, shortness of breath and wheezing.   Cardiovascular: Positive for palpitations. Negative for chest pain, orthopnea and leg swelling.  Gastrointestinal: Negative for heartburn, nausea, vomiting, abdominal pain, diarrhea and constipation.  Genitourinary: Negative for dysuria and hematuria.  Musculoskeletal: Negative for back pain and joint pain.  Skin: Negative for rash.  Neurological: Positive for weakness. Negative for sensory change, speech change, focal weakness and headaches.  Endo/Heme/Allergies: Does not bruise/bleed easily.  Psychiatric/Behavioral: Negative for depression. The patient is not nervous/anxious.   All other systems reviewed and are negative.   DRUG ALLERGIES:   Allergies  Allergen Reactions  . Ambien [Zolpidem Tartrate]     Over sedation  . Crestor [Rosuvastatin Calcium]     myalgias  . Other     Sedative or medications for sleep.  . Pradaxa [Dabigatran Etexilate Mesylate]     Inflammation in throat.  . Tramadol     Intolerant but not an allergy; sedation    VITALS:  Blood pressure 125/63, pulse 98, temperature 98.3 F (36.8 C), temperature source Oral, resp. rate 34, height 5\' 10"  (1.778 m), weight 87.544 kg (193 lb), SpO2 92 %.  PHYSICAL EXAMINATION:   Physical Exam  GENERAL:  75 y.o.-year-old patient lying in the bed with no acute distress.  EYES: Pupils equal, round, reactive to  light and accommodation. No scleral icterus. Extraocular muscles intact.  HEENT: Head atraumatic, normocephalic. Oropharynx and nasopharynx clear. NIV+ NECK:  Supple, no jugular venous distention. No thyroid enlargement, no tenderness.  LUNGS: Normal breath sounds bilaterally, no wheezing, rales, rhonchi. No use of accessory muscles of respiration.  CARDIOVASCULAR: S1, S2 normal. No murmurs, rubs, or gallops. irregualry irregular ABDOMEN: Soft, nontender, nondistended. Bowel sounds present. No organomegaly or mass.  EXTREMITIES: No cyanosis, clubbing. 3+ lower extremity edema NEUROLOGIC: Cranial nerves II through XII are intact. No focal Motor or sensory deficits b/l.   PSYCHIATRIC: The patient is alert and oriented x 3.  SKIN: No obvious rash, lesion, or ulcer.   LABORATORY PANEL:   CBC  Recent Labs Lab 10/26/15 0546  WBC 7.1  HGB 11.5*  HCT 35.5*  PLT 186   ------------------------------------------------------------------------------------------------------------------ Chemistries   Recent Labs Lab 10/24/15 0553 10/25/15 0723 10/26/15 0546  NA 132* 134* 134*  K 4.4 4.1 3.8  CL 92* 93* 90*  CO2 36* 35* 41*  GLUCOSE 106* 109* 108*  BUN 25* 21* 17  CREATININE 1.10 1.11 1.00  CALCIUM 8.4* 8.5* 8.4*  MG 1.9 1.8  --   AST 15  --   --   ALT 13*  --   --   ALKPHOS 73  --   --   BILITOT 0.6  --   --    ------------------------------------------------------------------------------------------------------------------  Cardiac Enzymes  Recent Labs Lab 10/22/15 1017  TROPONINI 0.03   ------------------------------------------------------------------------------------------------------------------  RADIOLOGY:  No results found.   ASSESSMENT AND PLAN:   75 year old male with past medical history of hypoventilation  syndrome, coronary artery disease status post bypass, chronic atrial fibrillation, diastolic CHF, who presented to the hospital due to persistent nausea  and vomiting and also noted to have some hematemesis and also noted to be Hypoxic.   # Acute on chronic respiratory failure with hypoxia - secondary to aspiration pneumonia. - Was on IV Zosyn. Now on Augmentin. -Wean O2 as tolerated. - Consult with PCCM appreciated - Pt on chronic NIV at home and continued here   # paroxysmal atrial fibrillation with rapid ventricular rate - Resumed Xarelto ok with Dr Candace Cruise. No further Gi bleed - His  multaq is on hold -cont metoprolol. - continue amiodarone drip. Transition to oral as per cardiology recommendation.   # Upper GI bleed - mild and resolved  No further bleeding. Hemoglobin stable. Likely Mallory-Weiss tear from vomiting. Appreciate GI help.  # BPH-continue Flomax.  # History of diastolic CHF continue torsemide, Aldactone, metoprolol.  # Coagnegative staph in blood cultures  contaminant. One out of 2 bottles.  All the records are reviewed and case discussed with Care Management/Social Workerr. Management plans discussed with the patient, family and they are in agreement.  CODE STATUS: FULL CODE  DVT Prophylaxis: SCDs  TOTAL critical TIME TAKING CARE OF THIS PATIENT: 30 minutes.   Hillary Bow R M.D on 10/26/2015 at 8:42 AM  Between 7am to 6pm - Pager - [phone]  After 6pm go to [email or web address] - password [email or web address] [facility]  Office  [phone]  CC: Primary care physician; [name], MD  Note: This dictation was prepared with Dragon dictation along with smaller phrase technology. Any transcriptional errors that result from this process are unintentional.

## 2015-10-26 NOTE — Progress Notes (Signed)
Patient: Christian [name] / Admit Date: 10/22/2015 / Date of Encounter: 10/26/2015, 7:51 AM   Subjective: SOB improving. On BiPAP. Herat rate improved from the 1-teens/120's to 80's to 90's this morning. No acute overnight events. Net output overnight of 2 L.   Review of Systems: Review of Systems  Constitutional: Positive for weight loss and malaise/fatigue. Negative for fever, chills and diaphoresis.  HENT: Negative for congestion.   Eyes: Negative for discharge and redness.  Respiratory: Positive for cough and shortness of breath. Negative for hemoptysis, sputum production and wheezing.   Cardiovascular: Negative for chest pain, palpitations, orthopnea, claudication, leg swelling and PND.  Gastrointestinal: Negative for nausea and vomiting.  Musculoskeletal: Negative for myalgias and falls.  Skin: Negative for rash.  Neurological: Positive for weakness.  Endo/Heme/Allergies: Does not bruise/bleed easily.  Psychiatric/Behavioral: The patient is not nervous/anxious.     Objective: Telemetry: Afib, 80's to 90's, rare PVC Physical Exam: Blood pressure 108/58, pulse 81, temperature 98.6 F (37 C), temperature source Oral, resp. rate 30, height 5\' 10"  (1.778 m), weight 193 lb (87.544 kg), SpO2 93 %. Body mass index is 27.69 kg/(m^2). General: Well developed, well nourished, in no acute distress. Head: Normocephalic, atraumatic, sclera non-icteric, no xanthomas, nares are without discharge. Neck: Negative for carotid bruits. JVP not elevated. Lungs: Decreased, coarse breath sounds. Breathing is unlabored. Heart: irregularly irregular, S1 S2 without murmurs, rubs, or gallops.  Abdomen: Soft, non-tender, non-distended with normoactive bowel sounds. No rebound/guarding. Extremities: No clubbing or cyanosis. No edema. Distal pedal pulses are 2+ and equal bilaterally. Neuro: Alert and oriented X 3. Moves all extremities spontaneously. Psych:  Responds to questions appropriately with a normal  affect.   Intake/Output Summary (Last 24 hours) at 10/26/15 0751 Last data filed at 10/25/15 2254  Gross per 24 hour  Intake      0 ml  Output   1970 ml  Net  -1970 ml    Inpatient Medications:  . amoxicillin-clavulanate  1 tablet Oral Q12H  . antiseptic oral rinse  7 mL Mouth Rinse BID  . digoxin  0.25 mg Oral Daily  . diltiazem  30 mg Oral 4 times per day  . furosemide  40 mg Intravenous Daily  . ipratropium  2 spray Each Nare TID  . metoprolol tartrate  25 mg Oral BID  . pantoprazole (PROTONIX) IV  40 mg Intravenous Q12H  . rivaroxaban  20 mg Oral Q supper  . spironolactone  25 mg Oral QODAY  . tamsulosin  0.4 mg Oral QPC supper   Infusions:  . amiodarone 60 mg/hr (10/26/15 0019)    Labs:  Recent Labs  10/24/15 0553 10/25/15 0723 10/26/15 0546  NA 132* 134* 134*  K 4.4 4.1 3.8  CL 92* 93* 90*  CO2 36* 35* 41*  GLUCOSE 106* 109* 108*  BUN 25* 21* 17  CREATININE 1.10 1.11 1.00  CALCIUM 8.4* 8.5* 8.4*  MG 1.9 1.8  --   PHOS 2.7  --   --     Recent Labs  10/24/15 0553  AST 15  ALT 13*  ALKPHOS 73  BILITOT 0.6  PROT 5.8*  ALBUMIN 3.0*    Recent Labs  10/24/15 0553 10/25/15 0723 10/26/15 0546  WBC 10.8* 9.4 7.1  NEUTROABS 9.7*  --   --   HGB 11.7* 11.2* 11.5*  HCT 37.4* 35.3* 35.5*  MCV 91.2 89.3 87.5  PLT 161 175 186   No results for input(s): CKTOTAL, CKMB, TROPONINI in the last  72 hours. Invalid input(s): POCBNP No results for input(s): HGBA1C in the last 72 hours.   Weights: Filed Weights   10/22/15 1005  Weight: 193 lb (87.544 kg)     Radiology/Studies:  Dg Chest 2 View  10/21/2015  CLINICAL DATA:  LEFT shoulder pain, shortness breath began today, coronary artery disease, diabetes mellitus, hypertension, atrial fibrillation, hepatic encephalopathy, street diastolic CHF, former smoker EXAM: CHEST  2 VIEW COMPARISON:  10/03/2012 FINDINGS: Enlargement of cardiac silhouette post CABG. Atherosclerotic calcification aorta. Lungs appear  emphysematous with atelectasis at both lung bases. RIGHT apex obscured by patient's chin/face. No definite infiltrate, pleural effusion, or pneumothorax. Bones diffusely demineralized. No acute osseous abnormalities identified. IMPRESSION: Enlargement of cardiac silhouette post CABG. COPD changes with bibasilar atelectasis. Electronically Signed   By: Lavonia Dana M.D.   On: 10/21/2015 14:52   Ct Angio Neck W/cm &/or Wo/cm  10/21/2015  CLINICAL DATA:  Severe left-sided neck pain over the last 4 days. EXAM: CT ANGIOGRAPHY NECK TECHNIQUE: Multidetector CT imaging of the neck was performed using the standard protocol during bolus administration of intravenous contrast. Multiplanar CT image reconstructions and MIPs were obtained to evaluate the vascular anatomy. Carotid stenosis measurements (when applicable) are obtained utilizing NASCET criteria, using the distal internal carotid diameter as the denominator. CONTRAST:  100 mL Isovue 370 COMPARISON:  MRI of the cervical spine from the same day. FINDINGS: Aortic arch: Atherosclerotic calcifications are present at the aortic arch. There is no significant stenosis of the great vessels. Right carotid system: Atherosclerotic calcifications are present within the right common carotid artery. Calcifications are noted at the carotid bifurcation is well without a significant luminal stenosis relative to the more distal vessel. The cervical right ICA is within normal limits to the skullbase. Left carotid system: Calcifications are present within the left common carotid artery. Calcified and noncalcified plaque is present at the left carotid bifurcation. There is no significant luminal stenosis relative to the more distal vessel. Focal calcification is present just below the left skullbase. This may be related to a remote injury or dissection. There is no acute dissection. The cervical left ICA is otherwise normal. Vertebral arteries:Dense calcifications are present at the  origin of the right vertebral artery with a high-grade stenosis. The proximal left vertebral artery is tortuous, also with a high-grade stenosis. The vertebral arteries are codominant. There are calcifications at the C2 level on the left with a 50% narrowing. Calcifications are present at dural margin with mild narrowing bilaterally. The PICA origins are visualized and normal. Degenerative facet changes are again noted throughout the cervical spine. Skeleton: Degenerative facet changes are again noted throughout the cervical spine. Other neck: No focal mucosal or submucosal lesions are present. The thyroid is unremarkable. There is no significant adenopathy. No focal hemorrhage or evidence for acute trauma is present. There is irregular thickening along the right major fissure, stable since 2013. Mild pleural thickening bilaterally is stable is well. IMPRESSION: 1. Atherosclerotic changes at the aortic arch, within the common carotid artery bilaterally and at both carotid bifurcations without significant focal carotid stenosis. 2. Calcification in the distal left common carotid artery may reflect remote dissection. No acute dissection is present. 3. 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 4. Mild narrowing of the vertebral arteries bilaterally at the dural margin. 5. Multilevel facet degenerative changes in the cervical spine. 6. Multi focal pleural thickening is stable. Electronically Signed   By: Harrell Gave  Mattern M.D.   On: 10/21/2015 19:04   Mr Cervical Spine Wo Contrast  10/21/2015  CLINICAL DATA:  Chronic neck pain with change 5 days ago radiating into the left shoulder. The examination had to be discontinued prior to completion due to patient refused further imaging. EXAM: MRI CERVICAL SPINE WITHOUT CONTRAST TECHNIQUE: Multiplanar, multisequence MR imaging of the cervical spine was performed. No intravenous  contrast was administered. COMPARISON:  MRI of the brain 09/06/2011. FINDINGS: Normal signal is present in the cervical and upper thoracic spinal cord to the lowest imaged level, T1-2. Marrow signal, vertebral body heights, alignment are normal. The study is mildly degraded by patient motion. C2-3: Mild facet hypertrophy is present on the left without significant stenosis. C3-4: Facet hypertrophy is present bilaterally. There is no focal disc protrusion or stenosis. C4-5: Moderate facet hypertrophy is present bilaterally. There is no significant disc protrusion or stenosis. C5-6: Mild facet hypertrophy is present bilaterally. The central canal and foramina are patent. C6-7:  Negative. C7-T1:  Negative. IMPRESSION: 1. Multilevel facet degenerative changes the upper cervical spine from C2-3 through C5-6. 2. No focal disc protrusion or stenosis in the cervical spine. Electronically Signed   By: San Morelle M.D.   On: 10/21/2015 17:10   Dg Chest Port 1 View  10/24/2015  CLINICAL DATA:  Acute respiratory failure. EXAM: PORTABLE CHEST 1 VIEW COMPARISON:  10/22/2015. FINDINGS: A poor inspiration is again is demonstrated with stable enlarged cardiac silhouette and post CABG changes. Stable prominence of the interstitial markings, mild patchy opacity at the left lung base and possible small left pleural effusion. Mild left shoulder degenerative changes. IMPRESSION: 1. Stable mild left basilar atelectasis or pneumonia and possible minimal left pleural effusion. 2. Stable cardiomegaly and mild chronic interstitial lung disease. Electronically Signed   By: Claudie Revering M.D.   On: 10/24/2015 07:20   Dg Chest Portable 1 View  10/22/2015  CLINICAL DATA:  Vomited last night, coughing blood EXAM: PORTABLE CHEST 1 VIEW COMPARISON:  10/21/2015 FINDINGS: Cardiomediastinal silhouette is stable. Status post CABG. There is small left pleural effusion with hazy left basilar atelectasis or infiltrate. Mild right basilar  atelectasis. Atherosclerotic calcifications of thoracic aorta again noted. No convincing pulmonary edema. IMPRESSION: Small left pleural effusion with hazy left basilar atelectasis or infiltrate. Small right basilar atelectasis. No convincing pulmonary edema. Status post CABG. Electronically Signed   By: Lahoma Crocker M.D.   On: 10/22/2015 11:14     Assessment and Plan   1. Acute on chronic hypoxic and hypercapnic respiratory failure: -Secondary to neuromuscular disease of some type -Has been stable on noninvasive positive pressure ventilation at home -Would continue positive pressure ventilation with napping, sleeping, and as tolerated -Chest x-ray worse 4/15, s/p IV Lasix for possible CHF -Breathing improving   2. Chronic atrial fibrillation with RVR: -Status post successful DCCV 04/20/2010 until he went back into Afib in 2012, and has remained in Afib since -Heart rate improved on amiodarone infusion, digoxin 0.25 mg, Lopressor, and prn diltiazem  -Likely exacerbated by underlying acute lung issues, hypoxia, hypercapnia -Multaq on hold -Transition from amiodarone infusion to PO amiodarone at this time given rate control -On digoxin 0.25 mg, has received one dose on 10/25/15 -Decrease digoxin to 0.15 mg daily -Continue metoprolol -Continue diltiazem 30 mg every 6 hours with hold parameters -Continue xarelto -Low Blood pressure limiting titration of medications  3. Chronic diastolic CHF -Leg edema is chronic, component of venous insufficiency -Echo does not suggest elevated right heart pressures -Improving -  Continue low-dose IV Lasix -High risk of diastolic CHF in the setting of atrial fibrillation with RVR -Continue Lopressor, spironolactone   4. CAD s/p CABG x 3 in 2009: -No angina  -Cardiac enzymes negative, he has been stable for many years -Continue beta blocker, statin, aspirin  Melvern Banker, PA-C Pager: [phone] 10/26/2015, 7:51 AM

## 2015-10-26 NOTE — Progress Notes (Signed)
Patient continues to use his home NIV device at bedtime with o2 bleedin.

## 2015-10-27 ENCOUNTER — Inpatient Hospital Stay: Payer: Medicare HMO

## 2015-10-27 DIAGNOSIS — R0902 Hypoxemia: Secondary | ICD-10-CM | POA: Insufficient documentation

## 2015-10-27 DIAGNOSIS — J9602 Acute respiratory failure with hypercapnia: Secondary | ICD-10-CM

## 2015-10-27 LAB — CULTURE, BLOOD (ROUTINE X 2): CULTURE: NO GROWTH

## 2015-10-27 LAB — GLUCOSE, CAPILLARY: Glucose-Capillary: 133 mg/dL — ABNORMAL HIGH (ref 65–99)

## 2015-10-27 MED ORDER — DIGOXIN 125 MCG PO TABS: 0.1250 mg | ORAL_TABLET | Freq: Every day | ORAL | Status: DC

## 2015-10-27 MED ORDER — DILTIAZEM HCL 30 MG PO TABS: 30.0000 mg | ORAL_TABLET | Freq: Three times a day (TID) | ORAL | Status: DC

## 2015-10-27 MED ORDER — AMOXICILLIN-POT CLAVULANATE 875-125 MG PO TABS: 1.0000 | ORAL_TABLET | Freq: Two times a day (BID) | ORAL | Status: DC

## 2015-10-27 MED ORDER — DRONEDARONE HCL 400 MG PO TABS
400.0000 mg | ORAL_TABLET | Freq: Two times a day (BID) | ORAL | Status: DC
  Filled 2015-10-27 (×2): qty 1

## 2015-10-27 MED ORDER — DILTIAZEM HCL 30 MG PO TABS
30.0000 mg | ORAL_TABLET | Freq: Three times a day (TID) | ORAL | Status: DC
  Filled 2015-10-27: qty 1

## 2015-10-27 MED ORDER — PANTOPRAZOLE SODIUM 40 MG PO TBEC
40.0000 mg | DELAYED_RELEASE_TABLET | Freq: Every day | ORAL | Status: DC
  Administered 2015-10-27: 40 mg via ORAL
  Filled 2015-10-27: qty 1

## 2015-10-27 NOTE — Progress Notes (Signed)
Per Dr. Darvin Neighbours patient might discharge depending on results of repeat chest xray. Orders to switch to PO protonix instead of IV. Patient still needs IV dose of lasix this morning.

## 2015-10-27 NOTE — Care Management (Signed)
I spoke with patient's daughter and she confirmed that patient is on continuous O2 at home through [facility] so no new orders needed for O2 at this time however they have been obtained. Patient has not used portable tanks at home for "3 years" and daughter "is scared to use them". I advised daughter to contact [facility] about this concern. I have asked Corene Cornea with [facility] to provide another O2 for patient to get home- daughter aware. [facility] aware of patient discharge to home today. When O2 tank is delivered patient may discharge. Lovena Le RN at lunch; message left with Lucina Mellow. No further RNCM needs. Case closed.

## 2015-10-27 NOTE — Care Management Important Message (Signed)
Important Message  Patient Details  Name: Christian [name] MRN: [id number] Date of Birth: Aug 09, 1940   Medicare Important Message Given:  Yes    [name] 10/27/2015, 10:31 AM

## 2015-10-27 NOTE — Progress Notes (Addendum)
Patient: Christian [name] / Admit Date: 10/22/2015 / Date of Encounter: 10/27/2015, 10:08 AM   Subjective: Doing well in the past 24-48 hours Heart rate well-controlled on metoprolol, digoxin, amiodarone, diltiazem Amiodarone infusion held yesterday Long discussion with family this morning, daughter at the bedside He has ambulated with PT Family still concerned about CHF and needing home PT They report he looks the best today than he has looked in the past several days -Daughter does report saturations into the 80s on room air yesterday while in bed, possibly related to poor expiratory effort   Review of Systems: Review of Systems  Respiratory: Negative.   Cardiovascular: Negative.   Gastrointestinal: Negative.   Musculoskeletal: Negative.   Neurological: Negative.   Psychiatric/Behavioral: Negative.   All other systems reviewed and are negative.   Objective: Telemetry: atrial fib rate 90s Physical Exam: Blood pressure 114/58, pulse 105, temperature 98.7 F (37.1 C), temperature source Axillary, resp. rate 20, height 5\' 10"  (1.778 m), weight 187 lb 8 oz (85.049 kg), SpO2 90 %. Body mass index is 26.9 kg/(m^2). General: Well developed, well nourished, in no acute distress. Head: Normocephalic, atraumatic, sclera non-icteric, no xanthomas, nares are without discharge. Neck: Negative for carotid bruits. JVP not elevated. Lungs: Scant rails, worse at the left base , Breathing is mildly labored. Heart:  irregular rate and rhythm without murmurs, rubs, or gallops.  Abdomen: Soft, non-tender, non-distended with normoactive bowel sounds. No rebound/guarding. Extremities: No clubbing or cyanosis. No edema. Distal pedal pulses are 2+ and equal bilaterally. Neuro: Alert and oriented X 3. Moves all extremities spontaneously. Psych:  Responds to questions appropriately with a normal affect.   Intake/Output Summary (Last 24 hours) at 10/27/15 1008 Last data filed at 10/27/15 0942  Gross  per 24 hour  Intake    480 ml  Output   1675 ml  Net  -1195 ml    Inpatient Medications:  . amoxicillin-clavulanate  1 tablet Oral Q12H  . antiseptic oral rinse  7 mL Mouth Rinse BID  . digoxin  0.125 mg Oral Daily  . diltiazem  30 mg Oral TID  . dronedarone  400 mg Oral BID WC  . furosemide  40 mg Intravenous Daily  . ipratropium  2 spray Each Nare TID  . metoprolol tartrate  25 mg Oral BID  . pantoprazole  40 mg Oral Daily  . rivaroxaban  20 mg Oral Q supper  . spironolactone  25 mg Oral QODAY  . tamsulosin  0.4 mg Oral QPC supper   Infusions:     Labs:  Recent Labs  10/25/15 0723 10/26/15 0546  NA 134* 134*  K 4.1 3.8  CL 93* 90*  CO2 35* 41*  GLUCOSE 109* 108*  BUN 21* 17  CREATININE 1.11 1.00  CALCIUM 8.5* 8.4*  MG 1.8  --    No results for input(s): AST, ALT, ALKPHOS, BILITOT, PROT, ALBUMIN in the last 72 hours.  Recent Labs  10/25/15 0723 10/26/15 0546  WBC 9.4 7.1  HGB 11.2* 11.5*  HCT 35.3* 35.5*  MCV 89.3 87.5  PLT 175 186   No results for input(s): CKTOTAL, CKMB, TROPONINI in the last 72 hours. Invalid input(s): POCBNP No results for input(s): HGBA1C in the last 72 hours.   Weights: Filed Weights   10/22/15 1005 10/26/15 1623  Weight: 193 lb (87.544 kg) 187 lb 8 oz (85.049 kg)     Radiology/Studies:  Dg Chest 2 View  10/21/2015  CLINICAL DATA:  LEFT shoulder  pain, shortness breath began today, coronary artery disease, diabetes mellitus, hypertension, atrial fibrillation, hepatic encephalopathy, street diastolic CHF, former smoker EXAM: CHEST  2 VIEW COMPARISON:  10/03/2012 FINDINGS: Enlargement of cardiac silhouette post CABG. Atherosclerotic calcification aorta. Lungs appear emphysematous with atelectasis at both lung bases. RIGHT apex obscured by patient's chin/face. No definite infiltrate, pleural effusion, or pneumothorax. Bones diffusely demineralized. No acute osseous abnormalities identified. IMPRESSION: Enlargement of cardiac  silhouette post CABG. COPD changes with bibasilar atelectasis. Electronically Signed   By: Lavonia Dana M.D.   On: 10/21/2015 14:52   Ct Angio Neck W/cm &/or Wo/cm  10/21/2015  IMPRESSION: 1. Atherosclerotic changes at the aortic arch, within the common carotid artery bilaterally and at both carotid bifurcations without significant focal carotid stenosis. 2. Calcification in the distal left common carotid artery may reflect remote dissection. No acute dissection is present. 3. 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 4. Mild narrowing of the vertebral arteries bilaterally at the dural margin. 5. Multilevel facet degenerative changes in the cervical spine. 6. Multi focal pleural thickening is stable. Electronically Signed   By: San Morelle M.D.   On: 10/21/2015 19:04   Mr Cervical Spine Wo Contrast  10/21/2015  IMPRESSION: 1. Multilevel facet degenerative changes the upper cervical spine from C2-3 through C5-6. 2. No focal disc protrusion or stenosis in the cervical spine. Electronically Signed   By: San Morelle M.D.   On: 10/21/2015 17:10   Dg Chest Port 1 View  10/24/2015  IMPRESSION: 1. Stable mild left basilar atelectasis or pneumonia and possible minimal left pleural effusion. 2. Stable cardiomegaly and mild chronic interstitial lung disease. Electronically Signed   By: Claudie Revering M.D.   On: 10/24/2015 07:20   Dg Chest Portable 1 View  10/22/2015   IMPRESSION: Small left pleural effusion with hazy left basilar atelectasis or infiltrate. Small right basilar atelectasis. No convincing pulmonary edema. Status post CABG. Electronically Signed   By: Lahoma Crocker M.D.   On: 10/22/2015 11:14     Assessment and Plan  75 y.o. male    ---->Acute on chronic hypoxic and hypercapnic respiratory failure. Secondary to neuromuscular disease of some type Has been stable on noninvasive positive pressure  ventilation at home -- positive pressure ventilation with napping, sleeping, and as tolerated. --Lasix today, then continue torsemide and Aldactone at discharge  ----> Atrial fibrillation with RVR Likely exacerbated by underlying acute lung issues, hypoxia, hypercapnia Continue xarelto --- Would continue metoprolol 25 mg twice a day, will restart multaq as he was taking as an outpatient, Low-dose digoxin 0.125 mg daily, I decrease diltiazem down to 30 minute grams 3 times a day (may need  to wean off diltiazem as an outpt as this may contribute to worsening leg edema, venous insufficiency, chronic issue)  -----> chronic diastolic CHF  leg edema is chronic, component of venous insufficiency Echo does not suggest elevated right heart pressures --Would continue Lasix today, consider changing to torsemide at discharge as he was taking as an outpatient  ---->cad, cabg: Cardiac enzymes negative, he has been stable for many years Would continue beta blocker, statin Not on asa, on xarelto   Long discussion with family today concerning his hospital course, answered all their questions.    Total encounter time more than 35 minutes  Greater than 50% was spent in counseling and coordination of care with the patient    Signed, Esmond Plants, MD, Ph.D. [facility] Porteous  [facility] 10/27/2015, 10:08 AM

## 2015-10-27 NOTE — Progress Notes (Signed)
Physical Therapy Treatment Patient Details Name: Christian [name] MRN: [id number] DOB: [birth year] Today's Date: 10/27/2015    History of Present Illness 75 y.o. male with a known history of Coronary artery disease status post bypass, diabetes, hypertension, hyperlipidemia, GERD, history of diastolic CHF, history of chronic atrial fibrillation, history of a hypoventilation syndrome, hepatic encephalopathy who presents to the hospital due to nausea and vomiting and also noted to be hypoxic. Patient presented to the emergency room yesterday due to back and neck pain.      PT Comments    Pt in bed, daughter in room.  Plan to D/C home today with home health.  Agreed to mobility with encouragement.  Out of bed with min a x 1.  Ambulated with rolling walker 40' without loss of balance.  Gait slow.  Pt was able to stand to use urinal without loss of balance.  Returned to bed with use of rail.  Discussed discharge with patient and daughter.  Both feel comfortable with plan.    Follow Up Recommendations        Equipment Recommendations       Recommendations for Other Services       Precautions / Restrictions Restrictions Weight Bearing Restrictions: No    Mobility  Bed Mobility Overal bed mobility: Needs Assistance Bed Mobility: Sidelying to Sit   Sidelying to sit: Min assist       General bed mobility comments: minimal assist to EOB, able to transition to supine ind.  Transfers Overall transfer level: Needs assistance Equipment used: Rolling walker (2 wheeled) Transfers: Sit to/from Stand Sit to Stand: Min guard         General transfer comment: good hand placements today  Ambulation/Gait Ambulation/Gait assistance: Min guard Ambulation Distance (Feet): 40 Feet Assistive device: Rolling walker (2 wheeled) Gait Pattern/deviations: Step-through pattern   Gait velocity interpretation: Below normal speed for age/gender     Stairs            Wheelchair Mobility     Modified Rankin (Stroke Patients Only)       Balance                                    Cognition Arousal/Alertness: Awake/alert Behavior During Therapy: WFL for tasks assessed/performed Overall Cognitive Status: Within Functional Limits for tasks assessed                      Exercises      General Comments        Pertinent Vitals/Pain      Home Living                      Prior Function            PT Goals (current goals can now be found in the care plan section) Acute Rehab PT Goals Patient Stated Goal: Go home    Frequency       PT Plan      Co-evaluation             End of Session Equipment Utilized During Treatment: Oxygen;Gait belt Activity Tolerance: Patient limited by fatigue;Patient tolerated treatment well Patient left: in bed;with call bell/phone within reach;with bed alarm set;with family/visitor present;with nursing/sitter in room     Time: [id number] PT Time Calculation (min) (ACUTE ONLY): 26 min  Charges:  $Gait Training: 23-37 mins  G Codes:      Chesley Noon 10/27/2015, 11:52 AM

## 2015-10-27 NOTE — Progress Notes (Signed)
SATURATION QUALIFICATIONS: (This note is used to comply with regulatory documentation for home oxygen)  Patient Saturations on Room Air at Rest = 86%  Patient Saturations on Room Air while Ambulating = n/a%  Patient Saturations on n/a liters of oxygen while Ambulating = n/a%  Please briefly explain why patient needs home oxygen: desats on room air at rest

## 2015-10-27 NOTE — Progress Notes (Signed)
O2 tank delivered by [facility]. Patient given discharge teaching and paperwork regarding medications, diet, follow-up appointments and activity. Patient understanding verbalized. No complaints at this time. IV and telemetry discontinued prior to leaving. Skin assessment as previously charted and vitals are stable; on portable O2 tank. Patient being discharged to home. Daughter present during discharge teaching. No further needs by Care Management. Prescriptions given to daughter at bedside.

## 2015-10-28 NOTE — Telephone Encounter (Signed)
Son dropped off FMLA ppw for completion. He will pick up at 4/25 appt if ready. PPw given to Grant.

## 2015-10-29 NOTE — Telephone Encounter (Signed)
Paperwork faxed to [facility] Jon aware paperwork has been faxed Copy for pt  Copy for file Copy for scan

## 2015-10-29 NOTE — Telephone Encounter (Signed)
fmla paperwork  In Dr Josefine Class IN BOX For review and signature PT has appointment with dr Damita Dunnings 4/25 would like to pick up then if possible

## 2015-10-29 NOTE — Telephone Encounter (Signed)
Form done, please scan and give to him.  Thanks.

## 2015-10-29 NOTE — Discharge Summary (Signed)
[facility] [place] NAME: Christian [name]    MR#:  [id number]  DATE OF BIRTH:  08-Apr-1941  DATE OF ADMISSION:  10/22/2015 ADMITTING PHYSICIAN: Henreitta Leber, MD  DATE OF DISCHARGE: 10/27/2015  2:27 PM  PRIMARY CARE PHYSICIAN: [name], MD   ADMISSION DIAGNOSIS:  CAP (community acquired pneumonia) [J18.9] Atrial fibrillation with rapid ventricular response ([place]) [I48.91] Acute respiratory failure with hypoxia ([place]) [J96.01] Sepsis, due to unspecified organism ([place]) [A41.9]  DISCHARGE DIAGNOSIS:  Active Problems:   Pneumonia   Hypertensive heart disease   Type II diabetes mellitus ([place])   Acute respiratory failure with hypoxia (HCC)   Atrial fibrillation with rapid ventricular response ([place])   CAP (community acquired pneumonia)   Sepsis ([place])   Acute on chronic respiratory failure with hypoxia and hypercapnia ([place])   Respiratory failure ([place])   Hypoxia   SECONDARY DIAGNOSIS:   Past Medical History  Diagnosis Date  . Coronary artery disease     a. 2009 s/p CABG x 3; 09/2007 Cath: patent grafts.  . Type II diabetes mellitus ([place])   . Hyperlipidemia   . Hypertensive heart disease   . GERD (gastroesophageal reflux disease)   . Arthritis   . Diastolic CHF, chronic ([place])     a. 04/2012 Echo: EF >55%, no rwma, mod conc LVH, mod dil LA, Triv MR/TR.  Marland Kitchen Nephrolithiasis   . Chronic atrial fibrillation ([place])     a. DCCV 04/2010-->reverted to AF by 2012;  b. CHA2DS2VASc = 4->Xarelto.  . CO2 retention     during admission to [facility] 2013  . CKD (chronic kidney disease), stage III   . Hepatic encephalopathy ([place])     2013  . 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 male with a known history of Coronary artery disease status post bypass, diabetes, hypertension, hyperlipidemia, GERD, history of diastolic CHF, history of chronic atrial fibrillation,  history of a hypoventilation syndrome, hepatic encephalopathy who presents to the hospital due to nausea and vomiting and also noted to be hypoxic. Patient presented to the emergency room yesterday due to back and neck pain and was given some IV Dilaudid here in the ER which improved his pain but he developed nausea and vomiting from it. he continued to have persistent nausea and vomiting yesterday and on through this morning and today his vomitus became blood-tinged and therefore his daughter brought him to the ER for further evaluation. In the emergency room patient still continues to have some spitting up at its nonbloody. He was noted to be hypoxic with O2 sats in the 70s. Hospitalist services were called in for treatment evaluation. Patient underwent a chest x-ray which showed possible left lower lobe infiltrate and he also had a leukocytosis.  HOSPITAL COURSE:   75 year old male with past medical history of hypoventilation syndrome, coronary artery disease status post bypass, chronic atrial fibrillation, diastolic CHF, who presented to the hospital due to persistent nausea and vomiting and also noted to have some hematemesis and also noted to be Hypoxic.   # Acute on chronic respiratory failure with hypoxia - secondary to aspiration pneumonia. - Was on IV Zosyn and transitioned to oral Augmentin. -Wean O2 as tolerated. - Consult with PCCM appreciated - Pt on chronic NIV at home and continued here .  # paroxysmal atrial fibrillation with rapid ventricular rate - now rate controlled - Resumed  Xarelto ok with Dr Candace Cruise. No further Gi bleed - His multaq is resumed at discharge -cont metoprolol. Patient was on amiodarone drip. 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   # Upper GI bleed - mild and resolved  No further bleeding. Hemoglobin stable. Likely Mallory-Weiss tear from vomiting. Appreciate GI help.  # BPH-continue Flomax.  #  History of diastolic CHF continue torsemide, Aldactone, metoprolol.  # Coagnegative staph in blood cultures contaminant. One out of 2 bottles.  Patient has been set up for continuous home oxygen at home. We will continue his NIV at night. Stable for discharge home with physical therapy.  CONSULTS OBTAINED:  Treatment Team:  Wilhelmina Mcardle, MD  DRUG ALLERGIES:   Allergies  Allergen Reactions  . Ambien [Zolpidem Tartrate]     Over sedation  . Crestor [Rosuvastatin Calcium]     myalgias  . Other     Sedative or medications for sleep.  . Pradaxa [Dabigatran Etexilate Mesylate]     Inflammation in throat.  . Tramadol     Intolerant but not an allergy; sedation    DISCHARGE MEDICATIONS:   Discharge Medication List as of 10/27/2015  1:22 PM    START taking these medications   Details  amoxicillin-clavulanate (AUGMENTIN) 875-125 MG tablet Take 1 tablet by mouth every 12 (twelve) hours., Starting 10/27/2015, Until Discontinued, Print    digoxin (LANOXIN) 0.125 MG tablet Take 1 tablet (0.125 mg total) by mouth daily., Starting 10/27/2015, Until Discontinued, Normal    diltiazem (CARDIZEM) 30 MG tablet Take 1 tablet (30 mg total) by mouth 3 (three) times daily., Starting 10/27/2015, Until Discontinued, Print      CONTINUE these medications which have NOT CHANGED   Details  acetaminophen (TYLENOL) 500 MG tablet Take 500 mg by mouth every 6 (six) hours as needed., Until Discontinued, Historical Med    dronedarone (MULTAQ) 400 MG tablet Take 1 tablet (400 mg total) by mouth 2 (two) times daily with a meal., Starting 04/07/2015, Until Discontinued, Normal    glucosamine-chondroitin 500-400 MG tablet Take 1 tablet by mouth daily., Until Discontinued, Historical Med    ipratropium (ATROVENT) 0.03 % nasal spray Place 2 sprays into both nostrils 3 (three) times daily., Until Discontinued, Historical Med    menthol-cetylpyridinium (CEPACOL) 3 MG lozenge Take 1 lozenge by mouth as needed  for sore throat., Until Discontinued, Historical Med    metoprolol tartrate (LOPRESSOR) 25 MG tablet Take 25 mg by mouth 2 (two) times daily., Until Discontinued, Historical Med    Multiple Vitamins-Minerals (CENTRUM SILVER PO) Take 1 tablet by mouth daily. , Until Discontinued, Historical Med    nitroGLYCERIN (NITROSTAT) 0.4 MG SL tablet Place 0.4 mg under the tongue every 5 (five) minutes as needed.  , Until Discontinued, Historical Med    NON FORMULARY Oxygen @ 2 liters at bedtime., Until Discontinued, Historical Med    omeprazole (PRILOSEC) 20 MG capsule Take 20 mg by mouth daily., Until Discontinued, Historical Med    potassium chloride (K-DUR,KLOR-CON) 10 MEQ tablet Take 10 mEq by mouth every other day., Until Discontinued, Historical Med    rivaroxaban (XARELTO) 20 MG TABS tablet Take 1 tablet (20 mg total) by mouth daily., Starting 08/10/2015, Until Discontinued, Normal    spironolactone (ALDACTONE) 25 MG tablet Take 25 mg by mouth every other day. , Starting 01/31/2013, Until Discontinued, Historical Med    tamsulosin (FLOMAX) 0.4 MG CAPS capsule Take 0.4 mg by mouth daily after  supper., Until Discontinued, Historical Med    torsemide (DEMADEX) 20 MG tablet Take 20 mg by mouth every other day., Until Discontinued, Historical Med    triamcinolone cream (KENALOG) 0.1 % Apply 1 application topically 2 (two) times daily as needed., Until Discontinued, Historical Med        Today   VITAL SIGNS:  Blood pressure 130/72, pulse 81, temperature 97.9 F (36.6 C), temperature source Axillary, resp. rate 17, height 5\' 10"  (1.778 m), weight 85.049 kg (187 lb 8 oz), SpO2 92 %.  I/O:  No intake or output data in the 24 hours ending 10/29/15 1424  PHYSICAL EXAMINATION:  Physical Exam  GENERAL:  75 y.o.-year-old patient lying in the bed with no acute distress.  LUNGS: Normal breath sounds bilaterally, no wheezing, rales,rhonchi or crepitation. No use of accessory muscles of respiration.   CARDIOVASCULAR: S1, S2 normal. No murmurs, rubs, or gallops.  ABDOMEN: Soft, non-tender, non-distended. Bowel sounds present. No organomegaly or mass.  NEUROLOGIC: Moves all 4 extremities. PSYCHIATRIC: The patient is alert and oriented x 3.  SKIN: No obvious rash, lesion, or ulcer.   DATA REVIEW:   CBC  Recent Labs Lab 10/26/15 0546  WBC 7.1  HGB 11.5*  HCT 35.5*  PLT 186    Chemistries   Recent Labs Lab 10/24/15 0553 10/25/15 0723 10/26/15 0546  NA 132* 134* 134*  K 4.4 4.1 3.8  CL 92* 93* 90*  CO2 36* 35* 41*  GLUCOSE 106* 109* 108*  BUN 25* 21* 17  CREATININE 1.10 1.11 1.00  CALCIUM 8.4* 8.5* 8.4*  MG 1.9 1.8  --   AST 15  --   --   ALT 13*  --   --   ALKPHOS 73  --   --   BILITOT 0.6  --   --     Cardiac Enzymes No results for input(s): TROPONINI in the last 168 hours.  Microbiology Results  Results for orders placed or performed during the hospital encounter of 10/22/15  Culture, blood (routine x 2)     Status: None   Collection Time: 10/22/15  2:00 PM  Result Value Ref Range Status   Specimen Description BLOOD RIGHT WRIST  Final   Special Requests BOTTLES DRAWN AEROBIC AND ANAEROBIC [place]  Final   Culture  Setup Time   Final    GRAM POSITIVE COCCI ANAEROBIC BOTTLE ONLY CRITICAL RESULT CALLED TO, READ BACK BY AND VERIFIED WITH: KAREN HAYES @ 1012 10/23/15 BY [place] Organism ID to follow    Culture   Final    COAGULASE NEGATIVE STAPHYLOCOCCUS ANAEROBIC BOTTLE ONLY Results consistent with contamination.    Report Status 10/27/2015 FINAL  Final  Blood Culture ID Panel (Reflexed)     Status: Abnormal   Collection Time: 10/22/15  2:00 PM  Result Value Ref Range Status   Enterococcus species NOT DETECTED NOT DETECTED Final   Vancomycin resistance NOT DETECTED NOT DETECTED Final   Listeria monocytogenes NOT DETECTED NOT DETECTED Final   Staphylococcus species DETECTED (A) NOT DETECTED Final    Comment: CRITICAL RESULT CALLED TO, READ BACK BY AND  VERIFIED WITH: Charlane Ferretti @ [id number] 10/23/15 by [place]    Staphylococcus aureus NOT DETECTED NOT DETECTED Final   Methicillin resistance DETECTED (A) NOT DETECTED Final    Comment: CRITICAL RESULT CALLED TO, READ BACK BY AND VERIFIED WITH: Charlane Ferretti @ [id number] 10/23/15 by [place]    Streptococcus species NOT DETECTED NOT DETECTED Final   Streptococcus agalactiae NOT DETECTED NOT  DETECTED Final   Streptococcus pneumoniae NOT DETECTED NOT DETECTED Final   Streptococcus pyogenes NOT DETECTED NOT DETECTED Final   Acinetobacter baumannii NOT DETECTED NOT DETECTED Final   Enterobacteriaceae species NOT DETECTED NOT DETECTED Final   Enterobacter cloacae complex NOT DETECTED NOT DETECTED Final   Escherichia coli NOT DETECTED NOT DETECTED Final   Klebsiella oxytoca NOT DETECTED NOT DETECTED Final   Klebsiella pneumoniae NOT DETECTED NOT DETECTED Final   Proteus species NOT DETECTED NOT DETECTED Final   Serratia marcescens NOT DETECTED NOT DETECTED Final   Carbapenem resistance NOT DETECTED NOT DETECTED Final   Haemophilus influenzae NOT DETECTED NOT DETECTED Final   Neisseria meningitidis NOT DETECTED NOT DETECTED Final   Pseudomonas aeruginosa NOT DETECTED NOT DETECTED Final   Candida albicans NOT DETECTED NOT DETECTED Final   Candida glabrata NOT DETECTED NOT DETECTED Final   Candida krusei NOT DETECTED NOT DETECTED Final   Candida parapsilosis NOT DETECTED NOT DETECTED Final   Candida tropicalis NOT DETECTED NOT DETECTED Final  Culture, blood (routine x 2)     Status: None   Collection Time: 10/22/15  2:08 PM  Result Value Ref Range Status   Specimen Description BLOOD LEFT ASSIST CONTROL  Final   Special Requests   Final    BOTTLES DRAWN AEROBIC AND ANAEROBIC 6CC AERO [place] ANA   Culture NO GROWTH 5 DAYS  Final   Report Status 10/27/2015 FINAL  Final  MRSA PCR Screening     Status: None   Collection Time: 10/22/15  3:47 PM  Result Value Ref Range Status   MRSA by PCR NEGATIVE NEGATIVE Final     Comment:        The GeneXpert MRSA Assay (FDA approved for NASAL specimens only), is one component of a comprehensive MRSA colonization surveillance program. It is not intended to diagnose MRSA infection nor to guide or monitor treatment for MRSA infections.     RADIOLOGY:  No results found.  Follow up with PCP in 1 week.  Management plans discussed with the patient, family and they are in agreement.  CODE STATUS:  Code Status History    Date Active Date Inactive Code Status Order ID Comments User Context   10/22/2015  3:46 PM 10/27/2015  5:27 PM Full Code [id number]  Henreitta Leber, MD Inpatient    Advance Directive Documentation        Most Recent Value   Type of Advance Directive  Healthcare Power of [place], Living will   Pre-existing out of facility DNR order (yellow form or pink MOST form)     "MOST" Form in Place?        TOTAL TIME TAKING CARE OF THIS PATIENT ON DAY OF DISCHARGE: more than 30 minutes.   Hillary Bow R M.D on 10/29/2015 at 2:24 PM  Between 7am to 6pm - Pager - [phone]  After 6pm go to [email or web address] - password [email or web address] [place]  Office  [phone]  CC: Primary care physician; [name], MD  Note: This dictation was prepared with Dragon dictation along with smaller phrase technology. Any transcriptional errors that result from this process are unintentional.

## 2015-11-03 ENCOUNTER — Encounter: Payer: Self-pay | Admitting: Family Medicine

## 2015-11-03 ENCOUNTER — Inpatient Hospital Stay: Payer: Medicare HMO

## 2015-11-03 ENCOUNTER — Ambulatory Visit (INDEPENDENT_AMBULATORY_CARE_PROVIDER_SITE_OTHER): Payer: Medicare HMO | Admitting: Family Medicine

## 2015-11-03 ENCOUNTER — Inpatient Hospital Stay: Payer: Medicare HMO | Attending: Oncology

## 2015-11-03 ENCOUNTER — Inpatient Hospital Stay (HOSPITAL_BASED_OUTPATIENT_CLINIC_OR_DEPARTMENT_OTHER): Payer: Medicare HMO | Admitting: Oncology

## 2015-11-03 VITALS — BP 112/72 | Temp 96.2°F

## 2015-11-03 VITALS — BP 102/58 | HR 102 | Temp 97.7°F | Wt 196.5 lb

## 2015-11-03 DIAGNOSIS — M542 Cervicalgia: Secondary | ICD-10-CM

## 2015-11-03 DIAGNOSIS — I517 Cardiomegaly: Secondary | ICD-10-CM

## 2015-11-03 DIAGNOSIS — I2510 Atherosclerotic heart disease of native coronary artery without angina pectoris: Secondary | ICD-10-CM | POA: Diagnosis not present

## 2015-11-03 DIAGNOSIS — I119 Hypertensive heart disease without heart failure: Secondary | ICD-10-CM | POA: Diagnosis not present

## 2015-11-03 DIAGNOSIS — M1990 Unspecified osteoarthritis, unspecified site: Secondary | ICD-10-CM | POA: Insufficient documentation

## 2015-11-03 DIAGNOSIS — I6529 Occlusion and stenosis of unspecified carotid artery: Secondary | ICD-10-CM | POA: Insufficient documentation

## 2015-11-03 DIAGNOSIS — J90 Pleural effusion, not elsewhere classified: Secondary | ICD-10-CM

## 2015-11-03 DIAGNOSIS — I6509 Occlusion and stenosis of unspecified vertebral artery: Secondary | ICD-10-CM | POA: Diagnosis not present

## 2015-11-03 DIAGNOSIS — K219 Gastro-esophageal reflux disease without esophagitis: Secondary | ICD-10-CM | POA: Insufficient documentation

## 2015-11-03 DIAGNOSIS — E785 Hyperlipidemia, unspecified: Secondary | ICD-10-CM | POA: Insufficient documentation

## 2015-11-03 DIAGNOSIS — I700 Atherosclerosis of aorta: Secondary | ICD-10-CM | POA: Diagnosis not present

## 2015-11-03 DIAGNOSIS — J849 Interstitial pulmonary disease, unspecified: Secondary | ICD-10-CM

## 2015-11-03 DIAGNOSIS — I482 Chronic atrial fibrillation: Secondary | ICD-10-CM | POA: Diagnosis not present

## 2015-11-03 DIAGNOSIS — Z79899 Other long term (current) drug therapy: Secondary | ICD-10-CM

## 2015-11-03 DIAGNOSIS — D509 Iron deficiency anemia, unspecified: Secondary | ICD-10-CM

## 2015-11-03 DIAGNOSIS — J9622 Acute and chronic respiratory failure with hypercapnia: Secondary | ICD-10-CM | POA: Diagnosis not present

## 2015-11-03 DIAGNOSIS — R0602 Shortness of breath: Secondary | ICD-10-CM | POA: Diagnosis not present

## 2015-11-03 DIAGNOSIS — K7290 Hepatic failure, unspecified without coma: Secondary | ICD-10-CM | POA: Insufficient documentation

## 2015-11-03 DIAGNOSIS — Z87442 Personal history of urinary calculi: Secondary | ICD-10-CM | POA: Diagnosis not present

## 2015-11-03 DIAGNOSIS — I5032 Chronic diastolic (congestive) heart failure: Secondary | ICD-10-CM

## 2015-11-03 DIAGNOSIS — Z87891 Personal history of nicotine dependence: Secondary | ICD-10-CM

## 2015-11-03 DIAGNOSIS — J9621 Acute and chronic respiratory failure with hypoxia: Secondary | ICD-10-CM | POA: Diagnosis not present

## 2015-11-03 DIAGNOSIS — I129 Hypertensive chronic kidney disease with stage 1 through stage 4 chronic kidney disease, or unspecified chronic kidney disease: Secondary | ICD-10-CM

## 2015-11-03 DIAGNOSIS — Z7901 Long term (current) use of anticoagulants: Secondary | ICD-10-CM

## 2015-11-03 DIAGNOSIS — J9600 Acute respiratory failure, unspecified whether with hypoxia or hypercapnia: Secondary | ICD-10-CM | POA: Diagnosis not present

## 2015-11-03 DIAGNOSIS — E119 Type 2 diabetes mellitus without complications: Secondary | ICD-10-CM | POA: Insufficient documentation

## 2015-11-03 DIAGNOSIS — N183 Chronic kidney disease, stage 3 (moderate): Secondary | ICD-10-CM

## 2015-11-03 LAB — CBC WITH DIFFERENTIAL/PLATELET
Basophils Absolute: 0.1 10*3/uL (ref 0–0.1)
Basophils Relative: 1 %
EOS ABS: 0.1 10*3/uL (ref 0–0.7)
Eosinophils Relative: 1 %
HCT: 38.3 % — ABNORMAL LOW (ref 40.0–52.0)
Hemoglobin: 12.2 g/dL — ABNORMAL LOW (ref 13.0–18.0)
LYMPHS ABS: 0.5 10*3/uL — AB (ref 1.0–3.6)
LYMPHS PCT: 5 %
MCH: 28.5 pg (ref 26.0–34.0)
MCHC: 32 g/dL (ref 32.0–36.0)
MCV: 88.9 fL (ref 80.0–100.0)
MONO ABS: 0.6 10*3/uL (ref 0.2–1.0)
Monocytes Relative: 7 %
Neutro Abs: 7.8 10*3/uL — ABNORMAL HIGH (ref 1.4–6.5)
Neutrophils Relative %: 86 %
PLATELETS: 245 10*3/uL (ref 150–440)
RBC: 4.3 MIL/uL — ABNORMAL LOW (ref 4.40–5.90)
RDW: 13.9 % (ref 11.5–14.5)
WBC: 9 10*3/uL (ref 3.8–10.6)

## 2015-11-03 LAB — IRON AND TIBC
Iron: 44 ug/dL — ABNORMAL LOW (ref 45–182)
Saturation Ratios: 18 % (ref 17.9–39.5)
TIBC: 245 ug/dL — ABNORMAL LOW (ref 250–450)
UIBC: 201 ug/dL

## 2015-11-03 LAB — FERRITIN: Ferritin: 532 ng/mL — ABNORMAL HIGH (ref 24–336)

## 2015-11-03 NOTE — Progress Notes (Signed)
Patient has recently been treated for aspiration pneumonia.

## 2015-11-03 NOTE — Progress Notes (Signed)
Pre visit review using our clinic review tool, if applicable. No additional management support is needed unless otherwise documented below in the visit note.  Recently with CAP admission now back home.  H/o hypercapnia with high O2 flow rates.  Is needing more care at home- family is checking on home care agencies.  Both son and daughter are helping out, but need FLMA papers done.  I'll work on those when possible.  In meantime, his nausea is better, at least some better. No vomiting.  No more bleeding.  He is deconditioned and weaker overall, ie had a set back in general with the most recent illness.  Still on prn O2 daily and then at night.  His energy level is up and down.  He still has some BLE edema, hasn't been on daily diuretics recently, but taking every other day.  Usually has a "better day" on days with diuretic use.  Last took torsemide yesterday.    He also has whitish irritation on the glans of the penis.    He wants to avoid hospitalization if at all possible.  He has f/u with cards pending for tomorrow.    He has f/u with the cancer center pending for later today.  I wanted to get BMET and BNP done concurrently with one lab draw.   PMH and SH reviewed  ROS: See HPI, otherwise noncontributory.  Meds, vitals, and allergies reviewed.   GEN: nad, alert and oriented, 89-90% on RA, 92% on 1/2 L O2 HEENT: mucous membranes moist NECK: supple w/o LA CV: IRR, not tachy PULM: ctab, no inc wob ABD: soft, +bs EXT: 1-2 + BLE edema noted SKIN: glans of penis irritated with likely fungal changes noted.

## 2015-11-03 NOTE — Patient Instructions (Addendum)
Get the labs done at the cancer clinic.  Take an extra torsemide and potassium today.   Update me tomorrow.   I think you'll have to check with home care agencies in the meantime about extra help at home.  I'll work on the [employer] in the meantime.  I would continue 1/2 L O2 at rest for now, with a goal pulse ox at ~90-92%.  Take care.

## 2015-11-04 ENCOUNTER — Ambulatory Visit (INDEPENDENT_AMBULATORY_CARE_PROVIDER_SITE_OTHER): Payer: Medicare HMO | Admitting: Cardiovascular Disease

## 2015-11-04 ENCOUNTER — Encounter: Payer: Self-pay | Admitting: Cardiovascular Disease

## 2015-11-04 VITALS — BP 110/60 | HR 94 | Ht 70.0 in | Wt 195.5 lb

## 2015-11-04 DIAGNOSIS — I5031 Acute diastolic (congestive) heart failure: Secondary | ICD-10-CM

## 2015-11-04 DIAGNOSIS — R600 Localized edema: Secondary | ICD-10-CM | POA: Diagnosis not present

## 2015-11-04 DIAGNOSIS — I4891 Unspecified atrial fibrillation: Secondary | ICD-10-CM | POA: Diagnosis not present

## 2015-11-04 DIAGNOSIS — I25810 Atherosclerosis of coronary artery bypass graft(s) without angina pectoris: Secondary | ICD-10-CM

## 2015-11-04 MED ORDER — METOPROLOL TARTRATE 50 MG PO TABS: 50.0000 mg | ORAL_TABLET | Freq: Two times a day (BID) | ORAL | Status: DC

## 2015-11-04 NOTE — Progress Notes (Signed)
Patient ID: Christian [name], male    DOB: Dec 08, 1940, 75 y.o.   MRN: [id number]  HPI Comments: 45 -year-old gentleman with a history of coronary artery disease, CABG x3 at [facility] in 2009, diabetes, history of DVT on the left with chronic lower extremity edema, catheterization in March 2009 showing patent grafts with elevated pulmonary pressures who developed atrial fibrillation at the beginning of the summer 2011, cardioversion 04/20/2010 who maintained NSR, Until 2012. He has chronic atrial fibrillation. He presents for follow-up of his atrial fibrillation, chronic diastolic CHF  He presents after recent discharge from the hospital He had intractable nausea, vomiting, suspected aspiration, sepsis picture, hypotension, atrial fibrillation with RVR Difficult time controlling his heart rate requiring numerous medications.  He required extensive BiPAP for respiratory distress, spent many days in bed, deconditioned. He was given aggressive diuretic/Lasix IV with improvement of his respiratory status.  In follow-up today, he is slowly improving, still weak. Family is helping him at home. He has significant leg edema bilaterally, has compression hose in place Did not take torsemide yesterday but realizes he needs to take it every day until edema has improved Family who presents with him today reports that he is very lethargic, gets tired easily, needs a nap  EKG on today's visit shows atrial fibrillation with ventricular rate 94 bpm, no significant ST or T-wave changes  Other past medical history Compliant with his BiPAP History of chronic fatigue No regular exercise program Previously was taking midodrine, currently this is not on his medication list. He was taking this in the morning Seeing a chiropractor which is helping his chronic back and arm pain, neck pain. Family is happy with progress there.  lab work  01/15/2014. CBC, BMP all normal, hemoglobin A1c well controlled  Remote h/o profound  esophagitis, weight loss of more than 50 pounds, anorexia, lack of interest, food getting stuck in his throat, difficulty swallowing dry mouth, periods of hallucinations and memory problems.  hospital at [facility] September 02 2011 following an EGD that showed severe esophagitis, moderate gastritis,.  cardioversion  for atrial fibrillation which was successful though in followup he converted back to atrial fibrillation. asymptomatic in June 2013 when seen in clinic while in atrial fibrillation .  His biggest complaint is that he has dizziness after he eats Several prior admissions to the hospital for failure to thrive, leg weakness, mental status changes, hypoxia, hypercapnic respiratory distress.   Sees Dr. Gwenette Greet of pulmonary,doing well on a noninvasive ventilator . No recent hospital admissions for hypercapnia and confusion.   Previous  Echocardiogram showed normal LV systolic function greater than 55%, borderline elevated right ventricular systolic pressures pulmonic aortic valve stenosis. an  Allergies  Allergen Reactions  . Ambien [Zolpidem Tartrate]     Over sedation  . Crestor [Rosuvastatin Calcium]     myalgias  . Other     Sedative or medications for sleep.  . Pradaxa [Dabigatran Etexilate Mesylate]     Inflammation in throat.  . Tramadol     Intolerant but not an allergy; sedation    Current Outpatient Prescriptions on File Prior to Visit  Medication Sig Dispense Refill  . acetaminophen (TYLENOL) 500 MG tablet Take 500 mg by mouth every 6 (six) hours as needed.    . digoxin (LANOXIN) 0.125 MG tablet Take 1 tablet (0.125 mg total) by mouth daily. 30 tablet 0  . dronedarone (MULTAQ) 400 MG tablet Take 1 tablet (400 mg total) by mouth 2 (two) times daily with a meal.  60 tablet 3  . glucosamine-chondroitin 500-400 MG tablet Take 1 tablet by mouth daily.    Marland Kitchen ipratropium (ATROVENT) 0.03 % nasal spray Place 2 sprays into both nostrils 3 (three) times daily.    Marland Kitchen  menthol-cetylpyridinium (CEPACOL) 3 MG lozenge Take 1 lozenge by mouth as needed for sore throat.    . Multiple Vitamins-Minerals (CENTRUM SILVER PO) Take 1 tablet by mouth daily.     . nitroGLYCERIN (NITROSTAT) 0.4 MG SL tablet Place 0.4 mg under the tongue every 5 (five) minutes as needed.      . NON FORMULARY Oxygen @ 2 liters at bedtime.    Marland Kitchen omeprazole (PRILOSEC) 20 MG capsule Take 20 mg by mouth daily.    . potassium chloride (K-DUR,KLOR-CON) 10 MEQ tablet Take 10 mEq by mouth every other day.    . rivaroxaban (XARELTO) 20 MG TABS tablet Take 1 tablet (20 mg total) by mouth daily. 90 tablet 3  . spironolactone (ALDACTONE) 25 MG tablet Take 25 mg by mouth every other day.     . tamsulosin (FLOMAX) 0.4 MG CAPS capsule Take 0.4 mg by mouth daily after supper.    . torsemide (DEMADEX) 20 MG tablet Take 20 mg by mouth every other day.    . triamcinolone cream (KENALOG) 0.1 % Apply 1 application topically 2 (two) times daily as needed.    . [DISCONTINUED] dabigatran (PRADAXA) 150 MG CAPS Take 1 capsule (150 mg total) by mouth every 12 (twelve) hours. 60 capsule 6  . [DISCONTINUED] lisinopril (PRINIVIL,ZESTRIL) 10 MG tablet Take 1 tablet (10 mg total) by mouth daily. 30 tablet 3  . [DISCONTINUED] metFORMIN (GLUCOPHAGE) 500 MG tablet Take 1 tablet (500 mg total) by mouth 2 (two) times daily with a meal. Intolerant of >500mg  per day 180 tablet 3  . [DISCONTINUED] sotalol (BETAPACE) 120 MG tablet Take 1 tablet (120 mg total) by mouth 2 (two) times daily. 60 tablet 6   No current facility-administered medications on file prior to visit.    Past Medical History  Diagnosis Date  . Coronary artery disease     a. 2009 s/p CABG x 3; 09/2007 Cath: patent grafts.  . Type II diabetes mellitus ([place])   . Hyperlipidemia   . Hypertensive heart disease   . GERD (gastroesophageal reflux disease)   . Arthritis   . Diastolic CHF, chronic ([place])     a. 04/2012 Echo: EF >55%, no rwma, mod conc LVH, mod dil LA,  Triv MR/TR.  Marland Kitchen Nephrolithiasis   . Chronic atrial fibrillation ([place])     a. DCCV 04/2010-->reverted to AF by 2012;  b. CHA2DS2VASc = 4->Xarelto.  . CO2 retention     during admission to [facility] 2013  . CKD (chronic kidney disease), stage III   . Hepatic encephalopathy ([place])     2013  . 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 Coronary artery bypass graft  2009    x 3 @ [place]  . Cardiac catheterization  2009    showing patent grafts with elevated pulmonary pressures  . Colonoscopy  2011    negative per report  . Iron transfusion      Social History  reports that he quit smoking about 34 years ago. His smoking use included Cigarettes. He has a 45 pack-year smoking history. He has never used smokeless tobacco. He reports that he does not drink alcohol or use illicit drugs.  Family  History family history includes Aneurysm in his mother; Arthritis in his mother; Diabetes in his father; Heart disease in his brother, brother, father, and sister; Heart failure in his father; Hyperlipidemia in his father; Hypertension in his mother; Stroke in his father. There is no history of Colon cancer or Prostate cancer.  Review of Systems  Constitutional: Positive for fatigue.  Respiratory: Negative.   Cardiovascular: Positive for leg swelling.  Gastrointestinal: Negative.   Musculoskeletal: Positive for gait problem.  Neurological: Positive for weakness.  Hematological: Negative.   Psychiatric/Behavioral: Negative.   All other systems reviewed and are negative.   BP 110/60 mmHg  Pulse 94  Ht 5\' 10"  (1.778 m)  Wt 195 lb 8 oz (88.678 kg)  BMI 28.05 kg/m2  Physical Exam  Constitutional: He is oriented to person, place, and time. He appears well-developed and well-nourished.  HENT:  Head: Normocephalic.  Nose: Nose normal.  Mouth/Throat: Oropharynx is clear and moist.  Eyes: Conjunctivae are normal. Pupils  are equal, round, and reactive to light.  Neck: Normal range of motion. Neck supple. No JVD present.  Cardiovascular: Normal rate, S1 normal, S2 normal and intact distal pulses.  An irregularly irregular rhythm present. Exam reveals no gallop and no friction rub.   Murmur heard.  Systolic murmur is present with a grade of 2/6  1-2+ pitting edema to below the knees, compression hose in place  Pulmonary/Chest: Effort normal and breath sounds normal. No respiratory distress. He has no wheezes. He has no rales. He exhibits no tenderness.  Abdominal: Soft. Bowel sounds are normal. He exhibits no distension. There is no tenderness.  Musculoskeletal: Normal range of motion. He exhibits edema. He exhibits no tenderness.  Lymphadenopathy:    He has no cervical adenopathy.  Neurological: He is alert and oriented to person, place, and time. Coordination normal.  Skin: Skin is warm and dry. No rash noted. No erythema.  Psychiatric: He has a normal mood and affect. His behavior is normal. Judgment and thought content normal.      Assessment and Plan   Nursing note and vitals reviewed.

## 2015-11-04 NOTE — Patient Instructions (Addendum)
You are doing well.  Please stop the diltiazem  Please increase the metoprolol up to 50 mg twice a day  Take torsemide daily with potassium until leg edema gets better  Please call us if you have new issues that need to be addressed before your next appt.  Your physician wants you to follow-up in: 3 months.  You will receive a reminder letter in the mail two months in advance. If you don't receive a letter, please call our office to schedule the follow-up appointment.

## 2015-11-04 NOTE — Assessment & Plan Note (Signed)
The diltiazem is likely complicating the leg edema picture. Recommended he stop the calcium channel blocker, increase his metoprolol as above, continue his compression hose, leg elevation   Total encounter time more than 25 minutes  Greater than 50% was spent in counseling and coordination of care with the patient

## 2015-11-04 NOTE — Assessment & Plan Note (Signed)
Heart rate is adequate, Given his leg edema we have recommended he stop the diltiazem, For rate control, increase metoprolol up to 50 mg twice a day He is on anticoagulation

## 2015-11-04 NOTE — Assessment & Plan Note (Signed)
Currently with no symptoms of angina. No further workup at this time. Continue current medication regimen. 

## 2015-11-04 NOTE — Assessment & Plan Note (Signed)
He continues to have significant leg edema, recommended he take torsemide daily until he gets back to his baseline. This would also likely help his respiratory status, as he tends to become hypercapnic in the setting of CHF exacerbation

## 2015-11-05 ENCOUNTER — Telehealth: Payer: Self-pay | Admitting: Family Medicine

## 2015-11-05 ENCOUNTER — Encounter: Payer: Self-pay | Admitting: Family Medicine

## 2015-11-05 NOTE — Assessment & Plan Note (Signed)
At this point, would need daily diuretic and K use, at least for a few days.  We'll check on getting BMET/BNP done.  He can still be followed as outpatient, doesn't appear to need inpatient tx at this point, he wants to avoid inpatient tx.   Continue with low flow O2, ~1/2 L O2 for now with goal sats in the lower 90s.   Family is checking on extra help at home.  I'll work on [employer] in the meantime.  Incidentally- d/w pt about topical otc antifungal as needed.  He agrees.   I'll await cards input in the meantime.  All in agreement.   >25 minutes spent in face to face time with patient, >50% spent in counselling or coordination of care

## 2015-11-05 NOTE — Telephone Encounter (Signed)
Daughter says they will have him out tomorrow for another MD appt and they will bring him by here for the lab draw.  Lab appt scheduled.

## 2015-11-05 NOTE — Telephone Encounter (Signed)
Notify pt.  Cancer center didn't draw the BMET and BNP.   I wouldn't make a special trip to get labs done today.   I saw Dr. Donivan Scull note.   I would continue the daily diuretic use until back to baseline.  Can HH draw labs on patient early next week? Thanks.

## 2015-11-06 ENCOUNTER — Other Ambulatory Visit (INDEPENDENT_AMBULATORY_CARE_PROVIDER_SITE_OTHER): Payer: Medicare HMO

## 2015-11-06 ENCOUNTER — Ambulatory Visit (INDEPENDENT_AMBULATORY_CARE_PROVIDER_SITE_OTHER): Payer: Medicare HMO | Admitting: Internal Medicine

## 2015-11-06 ENCOUNTER — Encounter: Payer: Self-pay | Admitting: Internal Medicine

## 2015-11-06 VITALS — BP 118/68 | HR 65 | Ht 69.0 in | Wt 192.0 lb

## 2015-11-06 DIAGNOSIS — R0602 Shortness of breath: Secondary | ICD-10-CM

## 2015-11-06 DIAGNOSIS — J9622 Acute and chronic respiratory failure with hypercapnia: Secondary | ICD-10-CM

## 2015-11-06 DIAGNOSIS — J690 Pneumonitis due to inhalation of food and vomit: Secondary | ICD-10-CM | POA: Diagnosis not present

## 2015-11-06 DIAGNOSIS — J9621 Acute and chronic respiratory failure with hypoxia: Secondary | ICD-10-CM | POA: Diagnosis not present

## 2015-11-06 DIAGNOSIS — R0689 Other abnormalities of breathing: Secondary | ICD-10-CM | POA: Diagnosis not present

## 2015-11-06 LAB — BASIC METABOLIC PANEL
BUN: 27 mg/dL — ABNORMAL HIGH (ref 6–23)
CO2: 47 mEq/L — ABNORMAL HIGH (ref 19–32)
Calcium: 9.3 mg/dL (ref 8.4–10.5)
Chloride: 89 mEq/L — ABNORMAL LOW (ref 96–112)
Creatinine, Ser: 1.19 mg/dL (ref 0.40–1.50)
GFR: 63.47 mL/min (ref >60.00)
GLUCOSE: 131 mg/dL — AB (ref 70–99)
Potassium: 4.3 mEq/L (ref 3.5–5.1)
SODIUM: 136 meq/L (ref 135–145)

## 2015-11-06 LAB — BRAIN NATRIURETIC PEPTIDE: Pro B Natriuretic peptide (BNP): 482 pg/mL — ABNORMAL HIGH (ref 0.0–100.0)

## 2015-11-06 NOTE — Assessment & Plan Note (Signed)
The patient continues to do very well on his noninvasive positive pressure device. He feels that he sleeps adequately, and that it has helped his breathing as well.   As stated by Dr. Gwenette Greet, I agree that he should continue this device.  It remains unclear whether this is a neuromuscular disease or central hypoventilation, but the family member tells me that he has siblings who are now experiencing very similar issues, patient is currently on 2 L supplemental oxygen at night only. Given that all the males in his family have had some sort of breathing or cardiac problem before the age of 5 I have recommended that the patient's son and the patient may need to get genetic counseling. This can be done by her primary care physician, and Anderson [name] (patient's daughter) stated she would look into it.

## 2015-11-06 NOTE — Assessment & Plan Note (Signed)
Recent hospitalization for cardiac Charvez pneumonia, chest x-ray at that time showed mild left basilar atelectasis versus infiltrate. Treated with antibiotics and weaning steroids. Since discharge is steadily improving, on supplemental oxygen, 0.5 L at rest, 1 L with exertion.  Plan: -Overall great critical improvement, will assess need for supplemental oxygen prior to next visit with 6 minute walk test -Incentive spirometry 10-15 times per day -Continue nighttime ventilation, 2 L bleed in at night.

## 2015-11-06 NOTE — Patient Instructions (Signed)
Follow up with Dr. Stevenson Clinch in:4-6 weeks - cont with supplemental O2 (0.5L at rest, and 1L with exertion) - 6 mwt (without O2) prior to follow up visit - continue night time ventilation (2L of O2 at night)

## 2015-11-06 NOTE — Telephone Encounter (Signed)
Wille Glaser called needing some changes on fmla paperwork  i changed a couple things that just need your initials  And on orange sticker needs time and days  In dr Josefine Class in box

## 2015-11-06 NOTE — Progress Notes (Signed)
Christian Christian [name]      MRN# [id number] Christian Christian [name] 26-May-1941   CC: Chief Complaint  Patient presents with  . Hospitalization Follow-up    pt. states breathing up & down since hosp. c/o SOB & occ wheezing. denies cough or cp/tightness.       Brief History: 07/2014 HPI The patient comes in today for follow-up of his known chronic respiratory failure, secondary to hypoventilation. It is unclear whether he has central hypoventilation, or some type of neuromuscular disease. His family member tells me that he has siblings who are starting to develop similar issues. He has been on noninvasive positive pressure ventilation, and has done exceptionally well. He sleeps better, and has better oxygenation with recruitment. He has no issues with his mask fit, and feels that it is very comfortable. He is aggravated with having to use oxygen at night, because it interferes with travel. It is difficult for him to carry the large concentrator with him. Plan: The patient continues to do very well on his noninvasive positive pressure device. He feels that he sleeps adequately, and that it has helped his breathing as well. I would like for him to continue on his device, and we'll get a three-month download from his home care company. It remains unclear whether this is a neuromuscular disease or central hypoventilation, but the family member tells me that he has siblings who are now experiencing very similar issues. Will check overnight oximetry on room air to see if he still needs oxygen, and I suspect that he does. If so, we can hopefully make travel easier for him with a loaner travel concentrator.   Events since last clinic visit: She presents today for follow-up visit of a recent hospitalization. He is a known history of chronic respiratory failure on nighttime ventilation with 2 L, this is mostly due to neuromuscular disorder, he's had multiple workup in urology, however in  etiology still yet to be determined. Hospitalization [place] recently from 10/25/2015 to 10/27/2015. Diagnosed with possible left lower lobe Christian [name], treated with antibiotics and steroids. He was discharged with supplemental oxygen at rest and with exertion, and weaning antibiotics and steroids. Today he does still have some mild shortness of breath and intermittent cough, however states that overall he is gradually improving. He is currently wearing supplemental oxygen 1 L with exertion and 0.5 L with rest. He states that he has some mild shallow breathing, but it is not getting worse since discharge. He uses nighttime ventilation every night with 2 L of oxygen bleed in. Today's accompanied he is by his daughter.     [name] 4/16-4/18 Active Problems:   Christian [name]   Hypertensive heart disease   Type II diabetes mellitus ([place])   Acute respiratory failure with hypoxia (HCC)   Atrial fibrillation with rapid ventricular response ([place])   CAP (community acquired Christian [name])   Sepsis ([place])   Acute on chronic respiratory failure with hypoxia and hypercapnia ([place])   Respiratory failure ([place])   Hypoxia Christian Christian [name]  is a 75 y.o. male with a known history of Coronary artery disease status post bypass, diabetes, hypertension, hyperlipidemia, GERD, history of diastolic CHF, history of chronic atrial fibrillation, history of a hypoventilation syndrome, hepatic encephalopathy who presents to the hospital due to nausea and vomiting and also noted to be hypoxic. Patient presented to the emergency room yesterday due to back and neck pain and was given some IV Dilaudid here in the ER which improved his pain but he  developed nausea and vomiting from it. he continued to have persistent nausea and vomiting yesterday and on through this morning and today his vomitus became blood-tinged and therefore his daughter brought him to the ER for further evaluation. In the emergency room patient still  continues to have some spitting up at its nonbloody. He was noted to be hypoxic with O2 sats in the 70s. Hospitalist services were called in for treatment evaluation. Patient underwent a chest x-ray which showed possible left lower lobe infiltrate and he also had a leukocytosis.  75 year old male with past medical history of hypoventilation syndrome, coronary artery disease status post bypass, chronic atrial fibrillation, diastolic CHF, who presented to the hospital due to persistent nausea and vomiting and also noted to have some hematemesis and also noted to be Hypoxic.   # Acute on chronic respiratory failure with hypoxia - secondary to aspiration Christian [name]. - Was on IV Zosyn and transitioned to oral Augmentin. -Wean O2 as tolerated. - Consult with PCCM appreciated - Pt on chronic NIV at home and continued here .  # paroxysmal atrial fibrillation with rapid ventricular rate - now rate controlled - Resumed Xarelto ok with Dr Christian Christian [name]. No further Gi bleed - His  multaq is resumed at discharge -cont metoprolol. Patient was on amiodarone drip. 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   # Upper GI bleed - mild and resolved   No further bleeding. Hemoglobin stable. Likely Mallory-Weiss tear from vomiting. Appreciate GI help.  # BPH-continue Flomax.  # History of diastolic CHF continue torsemide, Aldactone, metoprolol.  # Coagnegative staph in blood cultures  contaminant. One out of 2 bottles.  Patient has been set up for continuous home oxygen at home. We will continue his NIV at night. Stable for discharge home with physical therapy. 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 He has chronic atrial fibrillation. He presents for follow-up of his atrial fibrillation, chronic diastolic CHF Remote h/o profound esophagitis, weight loss of more than 50 pounds, anorexia, lack of interest, food getting stuck in his throat, difficulty swallowing dry mouth, periods of hallucinations and memory problems.  hospital at [facility] September 02 2011 following an EGD that showed severe esophagitis, moderate gastritis,.  cardioversion  for atrial fibrillation which was successful though in followup he converted back to atrial fibrillation. asymptomatic in June 2013 when seen in clinic while in atrial fibrillation .   His biggest complaint is that he has dizziness after he eats Several prior admissions to the hospital for failure to thrive, leg weakness, mental status changes, hypoxia, hypercapnic respiratory distress. Cardiology addressing the following - dCHF, CAD, Afib(on metoprolol and anticoagulation), Edema (diltiazem stopped)  Medication:   Current Outpatient Rx  Name  Route  Sig  Dispense  Refill  . acetaminophen (TYLENOL) 500 MG tablet   Oral   Take 500 mg by mouth every 6 (six) hours as needed.         . digoxin (LANOXIN) 0.125 MG tablet   Oral   Take 1 tablet (0.125 mg total) by mouth daily.   30 tablet   0   . dronedarone (MULTAQ) 400 MG tablet  Oral   Take 1 tablet (400 mg total) by mouth 2 (two) times daily with a meal.   60 tablet   3   . glucosamine-chondroitin 500-400 MG tablet   Oral   Take 1 tablet by mouth daily.         Marland Kitchen ipratropium (ATROVENT) 0.03 % nasal spray   Each Nare   Place 2 sprays into both nostrils 3 (three) times daily.         Marland Kitchen menthol-cetylpyridinium (CEPACOL) 3 MG lozenge   Oral   Take 1 lozenge by mouth as needed for sore throat.         . metoprolol tartrate (LOPRESSOR) 50 MG tablet   Oral   Take 1 tablet (50 mg total) by mouth 2  (two) times daily.   60 tablet   11   . Multiple Vitamins-Minerals (CENTRUM SILVER PO)   Oral   Take 1 tablet by mouth daily.          . nitroGLYCERIN (NITROSTAT) 0.4 MG SL tablet   Sublingual   Place 0.4 mg under the tongue every 5 (five) minutes as needed.           . NON FORMULARY      Oxygen @ 2 liters at bedtime.         Marland Kitchen omeprazole (PRILOSEC) 20 MG capsule   Oral   Take 20 mg by mouth daily.         . potassium chloride (K-DUR,KLOR-CON) 10 MEQ tablet   Oral   Take 10 mEq by mouth every other day.         . rivaroxaban (XARELTO) 20 MG TABS tablet   Oral   Take 1 tablet (20 mg total) by mouth daily.   90 tablet   3   . spironolactone (ALDACTONE) 25 MG tablet   Oral   Take 25 mg by mouth every other day.          . tamsulosin (FLOMAX) 0.4 MG CAPS capsule   Oral   Take 0.4 mg by mouth daily after supper.         . torsemide (DEMADEX) 20 MG tablet   Oral   Take 20 mg by mouth every other day.         . triamcinolone cream (KENALOG) 0.1 %   Topical   Apply 1 application topically 2 (two) times daily as needed.            Review of Systems  Constitutional: Negative for fever and chills.  Eyes: Negative for blurred vision.  Respiratory: Positive for cough, sputum production and shortness of breath. Negative for wheezing.   Cardiovascular: Negative for chest pain.  Gastrointestinal: Negative for heartburn.  Genitourinary: Negative for dysuria.  Musculoskeletal: Negative for myalgias.  Neurological: Negative for headaches.  Endo/Heme/Allergies: Does not bruise/bleed easily.  Psychiatric/Behavioral: Negative for depression.      Allergies:  Ambien; Crestor; Other; Pradaxa; and Tramadol  Physical Examination:  VS: BP 118/68 mmHg  Pulse 65  Ht 5\' 9"  (1.753 m)  Wt 192 lb (87.091 kg)  BMI 28.34 kg/m2  SpO2 92%  General Appearance: No distress  HEENT: PERRLA, no ptosis, no other lesions noticed Pulmonary:normal breath sounds.,  diaphragmatic excursion normal.No wheezing, No rales   Cardiovascular:  Normal S1,S2.  No m/r/g.     Abdomen:Exam: Benign, Soft, non-tender, No masses  Skin:   warm, no rashes, no ecchymosis  Extremities: normal, no cyanosis, clubbing, warm with normal capillary refill.  Rad results: (The following images and results were reviewed by Dr. Stevenson Clinch). CXR 10/22/15 EXAM: PORTABLE CHEST 1 VIEW  COMPARISON: 10/21/2015  FINDINGS: Cardiomediastinal silhouette is stable. Status post CABG. There is small left pleural effusion with hazy left basilar atelectasis or infiltrate. Mild right basilar atelectasis. Atherosclerotic calcifications of thoracic aorta again noted. No convincing pulmonary edema.  IMPRESSION: Small left pleural effusion with hazy left basilar atelectasis or infiltrate. Small right basilar atelectasis. No convincing pulmonary edema. Status post CABG.    Assessment and Plan: Christian [name] Recent hospitalization for cardiac Christian Christian [name], chest x-ray at that time showed mild left basilar atelectasis versus infiltrate. Treated with antibiotics and weaning steroids. Since discharge is steadily improving, on supplemental oxygen, 0.5 L at rest, 1 L with exertion.  Plan: -Overall great critical improvement, will assess need for supplemental oxygen prior to next visit with 6 minute walk test -Incentive spirometry 10-15 times per day -Continue nighttime ventilation, 2 L bleed in at night.  Hypoventilation syndrome The patient continues to do very well on his noninvasive positive pressure device. He feels that he sleeps adequately, and that it has helped his breathing as well.   As stated by Dr. Gwenette Greet, I agree that he should continue this device.  It remains unclear whether this is a neuromuscular disease or central hypoventilation, but the family member tells me that he has siblings who are now experiencing very similar issues, patient is currently on 2 L supplemental oxygen  at night only. Given that all the males in his family have had some sort of breathing or cardiac problem before the age of 64 I have recommended that the patient's son and the patient may need to get genetic counseling. This can be done by her primary care physician, and Christian Christian [name] (patient's daughter) stated she would look into it.      Acute on chronic respiratory failure with hypoxia and hypercapnia (HCC) Secondary to recent community acquired Christian [name], left lower lobe. Completed antibiotic and steroid therapy during hospitalization and shortly afterwards. Due to deconditioning and recent Christian [name], now has oxygen requirement. I believe the suction requirement can be weaned off he is on minimal oxygen at this time. We'll plan for 6 minute walk test and further evaluate oxygen needs prior to follow-up with me    Updated Medication List Outpatient Encounter Prescriptions as of 11/06/2015  Medication Sig  . acetaminophen (TYLENOL) 500 MG tablet Take 500 mg by mouth every 6 (six) hours as needed.  . digoxin (LANOXIN) 0.125 MG tablet Take 1 tablet (0.125 mg total) by mouth daily.  Marland Kitchen dronedarone (MULTAQ) 400 MG tablet Take 1 tablet (400 mg total) by mouth 2 (two) times daily with a meal.  . glucosamine-chondroitin 500-400 MG tablet Take 1 tablet by mouth daily.  Marland Kitchen ipratropium (ATROVENT) 0.03 % nasal spray Place 2 sprays into both nostrils 3 (three) times daily.  Marland Kitchen menthol-cetylpyridinium (CEPACOL) 3 MG lozenge Take 1 lozenge by mouth as needed for sore throat.  . metoprolol tartrate (LOPRESSOR) 50 MG tablet Take 1 tablet (50 mg total) by mouth 2 (two) times daily.  . Multiple Vitamins-Minerals (CENTRUM SILVER PO) Take 1 tablet by mouth daily.   . nitroGLYCERIN (NITROSTAT) 0.4 MG SL tablet Place 0.4 mg under the tongue every 5 (five) minutes as needed.    . NON FORMULARY Oxygen @ 2 liters at bedtime.  Marland Kitchen omeprazole (PRILOSEC) 20 MG capsule Take 20 mg by mouth daily.  . potassium chloride  (K-DUR,KLOR-CON) 10 MEQ tablet Take 10 mEq by mouth  every other day.  . rivaroxaban (XARELTO) 20 MG TABS tablet Take 1 tablet (20 mg total) by mouth daily.  Marland Kitchen spironolactone (ALDACTONE) 25 MG tablet Take 25 mg by mouth every other day.   . tamsulosin (FLOMAX) 0.4 MG CAPS capsule Take 0.4 mg by mouth daily after supper.  . torsemide (DEMADEX) 20 MG tablet Take 20 mg by mouth every other day.  . triamcinolone cream (KENALOG) 0.1 % Apply 1 application topically 2 (two) times daily as needed.   No facility-administered encounter medications on file as of 11/06/2015.    Orders for this visit: Orders Placed This Encounter  Procedures  . 6 minute walk    On room air    Standing Status: Future     Number of Occurrences:      Standing Expiration Date: 11/05/2016    Order Specific Question:  Where should this test be performed?    Answer:  Other    Thank  you for the visitation and for allowing  [facility] to assist in the care of your patient. Our recommendations are noted above.  Please contact us if we can be of further service.  Vilinda Boehringer, MD [facility] Office Number: [phone]  Note: This note was prepared with Dragon dictation along with smaller phrase technology. Any transcriptional errors that result from this process are unintentional.

## 2015-11-06 NOTE — Assessment & Plan Note (Signed)
Secondary to recent community acquired pneumonia, left lower lobe. Completed antibiotic and steroid therapy during hospitalization and shortly afterwards. Due to deconditioning and recent pneumonia, now has oxygen requirement. I believe the suction requirement can be weaned off he is on minimal oxygen at this time. We'll plan for 6 minute walk test and further evaluate oxygen needs prior to follow-up with me

## 2015-11-08 NOTE — Progress Notes (Signed)
[place]  [phone] [phone]  ID: Schafer Studer [place] OB: 09/07/1940  MR#: [id number]  [id number]  Patient Care Team: Tonia Ghent, MD as PCP - General (Family Medicine) Lloyd Huger, MD as Consulting Physician (Oncology)  CHIEF COMPLAINT:  Chief Complaint  Patient presents with  . Anemia    INTERVAL HISTORY: Patient returns to clinic today for repeat laboratory work, further evaluation, and consideration of additional IV iron. He does not complain of weakness or fatigue today. He has no neurologic complaints. He denies any recent fevers. He continues to have chronic dyspnea on exertion which is unchanged. He denies any chest pain. He denies any nausea, vomiting, constipation, or diarrhea. He has no urinary complaints. Patient offers no further specific complaints today.  REVIEW OF SYSTEMS:   Review of Systems  Constitutional: Negative for fever and malaise/fatigue.  Respiratory: Positive for shortness of breath. Negative for cough.   Cardiovascular: Negative.  Negative for chest pain.  Gastrointestinal: Negative.  Negative for blood in stool and melena.  Musculoskeletal: Negative.   Neurological: Negative.  Negative for weakness.    As per HPI. Otherwise, a complete review of systems is negatve.  PAST MEDICAL HISTORY: Past Medical History  Diagnosis Date  . Coronary artery disease     a. 2009 s/p CABG x 3; 09/2007 Cath: patent grafts.  . Type II diabetes mellitus ([place])   . Hyperlipidemia   . Hypertensive heart disease   . GERD (gastroesophageal reflux disease)   . Arthritis   . Diastolic CHF, chronic ([place])     a. 04/2012 Echo: EF >55%, no rwma, mod conc LVH, mod dil LA, Triv MR/TR.  Marland Kitchen Nephrolithiasis   . Chronic atrial fibrillation ([place])     a. DCCV 04/2010-->reverted to AF by 2012;  b. CHA2DS2VASc = 4->Xarelto.  . CO2 retention     during admission to [facility] 2013  . CKD (chronic kidney disease), stage III   . 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 Hypoventilation syndrome     probably neuromuscular disease-on NIPPV    PAST SURGICAL HISTORY: Past Surgical History  Procedure Laterality Date  . Coronary artery bypass graft  2009    x 3 @ [facility]  . Cardiac catheterization  2009    showing patent grafts with elevated pulmonary pressures  . Colonoscopy  2011    negative per report  . Iron transfusion      FAMILY HISTORY Family History  Problem Relation Age of Onset  . Heart disease Brother   . Arthritis Mother   . Hypertension Mother   . Aneurysm Mother   . Heart disease Father   . Hyperlipidemia Father   . Stroke Father   . Diabetes Father   . Heart failure Father   . Heart disease Sister   . Colon cancer Neg Hx   . Prostate cancer Neg Hx   . Heart disease Brother        ADVANCED DIRECTIVES:    HEALTH MAINTENANCE: Social History  Substance Use Topics  . Smoking status: Former Smoker -- 1.50 packs/day for 30 years    Types: Cigarettes    Quit date: 07/11/1981  . Smokeless tobacco: Never Used  . Alcohol Use: No     Colonoscopy:  PAP:  Bone density:  Lipid panel:  Allergies  Allergen Reactions  . Ambien [Zolpidem Tartrate]     Over sedation  . Crestor [Rosuvastatin Calcium]     myalgias  .  Other     Sedative or medications for sleep.  . Pradaxa [Dabigatran Etexilate Mesylate]     Inflammation in throat.  . Tramadol     Intolerant but not an allergy; sedation    Current Outpatient Prescriptions  Medication Sig Dispense Refill  . acetaminophen (TYLENOL) 500 MG tablet Take 500 mg by mouth every 6 (six) hours as needed.    . digoxin (LANOXIN) 0.125 MG tablet Take 1 tablet (0.125 mg total) by mouth daily. 30 tablet 0  . dronedarone (MULTAQ) 400 MG tablet Take 1 tablet (400 mg total) by mouth 2 (two) times daily with a meal. 60 tablet 3  . glucosamine-chondroitin 500-400 MG tablet Take 1 tablet by mouth daily.    Marland Kitchen ipratropium (ATROVENT) 0.03 % nasal spray Place 2  sprays into both nostrils 3 (three) times daily.    Marland Kitchen menthol-cetylpyridinium (CEPACOL) 3 MG lozenge Take 1 lozenge by mouth as needed for sore throat.    . Multiple Vitamins-Minerals (CENTRUM SILVER PO) Take 1 tablet by mouth daily.     . nitroGLYCERIN (NITROSTAT) 0.4 MG SL tablet Place 0.4 mg under the tongue every 5 (five) minutes as needed.      . NON FORMULARY Oxygen @ 2 liters at bedtime.    Marland Kitchen omeprazole (PRILOSEC) 20 MG capsule Take 20 mg by mouth daily.    . potassium chloride (K-DUR,KLOR-CON) 10 MEQ tablet Take 10 mEq by mouth every other day.    . rivaroxaban (XARELTO) 20 MG TABS tablet Take 1 tablet (20 mg total) by mouth daily. 90 tablet 3  . spironolactone (ALDACTONE) 25 MG tablet Take 25 mg by mouth every other day.     . tamsulosin (FLOMAX) 0.4 MG CAPS capsule Take 0.4 mg by mouth daily after supper.    . torsemide (DEMADEX) 20 MG tablet Take 20 mg by mouth every other day.    . triamcinolone cream (KENALOG) 0.1 % Apply 1 application topically 2 (two) times daily as needed.    . metoprolol tartrate (LOPRESSOR) 50 MG tablet Take 1 tablet (50 mg total) by mouth 2 (two) times daily. 60 tablet 11  . [DISCONTINUED] dabigatran (PRADAXA) 150 MG CAPS Take 1 capsule (150 mg total) by mouth every 12 (twelve) hours. 60 capsule 6  . [DISCONTINUED] lisinopril (PRINIVIL,ZESTRIL) 10 MG tablet Take 1 tablet (10 mg total) by mouth daily. 30 tablet 3  . [DISCONTINUED] metFORMIN (GLUCOPHAGE) 500 MG tablet Take 1 tablet (500 mg total) by mouth 2 (two) times daily with a meal. Intolerant of >500mg  per day 180 tablet 3  . [DISCONTINUED] sotalol (BETAPACE) 120 MG tablet Take 1 tablet (120 mg total) by mouth 2 (two) times daily. 60 tablet 6   No current facility-administered medications for this visit.    OBJECTIVE: Filed Vitals:   11/03/15 1400  BP: 112/72  Temp: 96.2 F (35.7 C)     There is no weight on file to calculate BMI.    ECOG FS:0 - Asymptomatic  General: Well-developed,  well-nourished, no acute distress. Eyes: Pink conjunctiva, anicteric sclera. Lungs: Clear to auscultation bilaterally. Heart: Regular rate and rhythm. No rubs, murmurs, or gallops. Abdomen: Soft, nontender, nondistended. No organomegaly noted, normoactive bowel sounds. Musculoskeletal: No edema, cyanosis, or clubbing. Neuro: Alert, answering all questions appropriately. Cranial nerves grossly intact. Skin: No rashes or petechiae noted. Psych: Normal affect.   LAB RESULTS:  Lab Results  Component Value Date   NA 136 11/06/2015   K 4.3 11/06/2015   CL 89* 11/06/2015  CO2 47* 11/06/2015   GLUCOSE 131* 11/06/2015   BUN 27* 11/06/2015   CREATININE 1.19 11/06/2015   CALCIUM 9.3 11/06/2015   PROT 5.8* 10/24/2015   ALBUMIN 3.0* 10/24/2015   AST 15 10/24/2015   ALT 13* 10/24/2015   ALKPHOS 73 10/24/2015   BILITOT 0.6 10/24/2015   GFRNONAA >60 10/26/2015   GFRAA >60 10/26/2015    Lab Results  Component Value Date   WBC 9.0 11/03/2015   NEUTROABS 7.8* 11/03/2015   HGB 12.2* 11/03/2015   HCT 38.3* 11/03/2015   MCV 88.9 11/03/2015   PLT 245 11/03/2015   Lab Results  Component Value Date   IRON 44* 11/03/2015   TIBC 245* 11/03/2015   IRONPCTSAT 18 11/03/2015    Lab Results  Component Value Date   FERRITIN 532* 11/03/2015      STUDIES: Dg Chest 2 View  10/27/2015  CLINICAL DATA:  Followup aspiration pneumonia EXAM: CHEST  2 VIEW COMPARISON:  10/24/2015 FINDINGS: Moderate cardiac enlargement and aortic atherosclerosis. The heart size is moderately enlarged. Pulmonary edema is slightly improved from previous exam. Persistent left base opacity compatible with aspiration pneumonia. IMPRESSION: 1. Persistent left base pneumonia Electronically Signed   By: Kerby Moors M.D.   On: 10/27/2015 10:45   Dg Chest 2 View  10/21/2015  CLINICAL DATA:  LEFT shoulder pain, shortness breath began today, coronary artery disease, diabetes mellitus, hypertension, atrial fibrillation,  hepatic encephalopathy, street diastolic CHF, former smoker EXAM: CHEST  2 VIEW COMPARISON:  10/03/2012 FINDINGS: Enlargement of cardiac silhouette post CABG. Atherosclerotic calcification aorta. Lungs appear emphysematous with atelectasis at both lung bases. RIGHT apex obscured by patient's chin/face. No definite infiltrate, pleural effusion, or pneumothorax. Bones diffusely demineralized. No acute osseous abnormalities identified. IMPRESSION: Enlargement of cardiac silhouette post CABG. COPD changes with bibasilar atelectasis. Electronically Signed   By: Lavonia Dana M.D.   On: 10/21/2015 14:52   Ct Angio Neck W/cm &/or Wo/cm  10/21/2015  CLINICAL DATA:  Severe left-sided neck pain over the last 4 days. EXAM: CT ANGIOGRAPHY NECK TECHNIQUE: Multidetector CT imaging of the neck was performed using the standard protocol during bolus administration of intravenous contrast. Multiplanar CT image reconstructions and MIPs were obtained to evaluate the vascular anatomy. Carotid stenosis measurements (when applicable) are obtained utilizing NASCET criteria, using the distal internal carotid diameter as the denominator. CONTRAST:  100 mL Isovue 370 COMPARISON:  MRI of the cervical spine from the same day. FINDINGS: Aortic arch: Atherosclerotic calcifications are present at the aortic arch. There is no significant stenosis of the great vessels. Right carotid system: Atherosclerotic calcifications are present within the right common carotid artery. Calcifications are noted at the carotid bifurcation is well without a significant luminal stenosis relative to the more distal vessel. The cervical right ICA is within normal limits to the skullbase. Left carotid system: Calcifications are present within the left common carotid artery. Calcified and noncalcified plaque is present at the left carotid bifurcation. There is no significant luminal stenosis relative to the more distal vessel. Focal calcification is present just below  the left skullbase. This may be related to a remote injury or dissection. There is no acute dissection. The cervical left ICA is otherwise normal. Vertebral arteries:Dense calcifications are present at the origin of the right vertebral artery with a high-grade stenosis. The proximal left vertebral artery is tortuous, also with a high-grade stenosis. The vertebral arteries are codominant. There are calcifications at the C2 level on the left with a 50% narrowing. Calcifications are  present at dural margin with mild narrowing bilaterally. The PICA origins are visualized and normal. Degenerative facet changes are again noted throughout the cervical spine. Skeleton: Degenerative facet changes are again noted throughout the cervical spine. Other neck: No focal mucosal or submucosal lesions are present. The thyroid is unremarkable. There is no significant adenopathy. No focal hemorrhage or evidence for acute trauma is present. There is irregular thickening along the right major fissure, stable since 2013. Mild pleural thickening bilaterally is stable is well. IMPRESSION: 1. Atherosclerotic changes at the aortic arch, within the common carotid artery bilaterally and at both carotid bifurcations without significant focal carotid stenosis. 2. Calcification in the distal left common carotid artery may reflect remote dissection. No acute dissection is present. 3. 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 4. Mild narrowing of the vertebral arteries bilaterally at the dural margin. 5. Multilevel facet degenerative changes in the cervical spine. 6. Multi focal pleural thickening is stable. Electronically Signed   By: San Morelle M.D.   On: 10/21/2015 19:04   Mr Cervical Spine Wo Contrast  10/21/2015  CLINICAL DATA:  Chronic neck pain with change 5 days ago radiating into the left shoulder. The examination had to be discontinued  prior to completion due to patient refused further imaging. EXAM: MRI CERVICAL SPINE WITHOUT CONTRAST TECHNIQUE: Multiplanar, multisequence MR imaging of the cervical spine was performed. No intravenous contrast was administered. COMPARISON:  MRI of the brain 09/06/2011. FINDINGS: Normal signal is present in the cervical and upper thoracic spinal cord to the lowest imaged level, T1-2. Marrow signal, vertebral body heights, alignment are normal. The study is mildly degraded by patient motion. C2-3: Mild facet hypertrophy is present on the left without significant stenosis. C3-4: Facet hypertrophy is present bilaterally. There is no focal disc protrusion or stenosis. C4-5: Moderate facet hypertrophy is present bilaterally. There is no significant disc protrusion or stenosis. C5-6: Mild facet hypertrophy is present bilaterally. The central canal and foramina are patent. C6-7:  Negative. C7-T1:  Negative. IMPRESSION: 1. Multilevel facet degenerative changes the upper cervical spine from C2-3 through C5-6. 2. No focal disc protrusion or stenosis in the cervical spine. Electronically Signed   By: San Morelle M.D.   On: 10/21/2015 17:10   Dg Chest Port 1 View  10/24/2015  CLINICAL DATA:  Acute respiratory failure. EXAM: PORTABLE CHEST 1 VIEW COMPARISON:  10/22/2015. FINDINGS: A poor inspiration is again is demonstrated with stable enlarged cardiac silhouette and post CABG changes. Stable prominence of the interstitial markings, mild patchy opacity at the left lung base and possible small left pleural effusion. Mild left shoulder degenerative changes. IMPRESSION: 1. Stable mild left basilar atelectasis or pneumonia and possible minimal left pleural effusion. 2. Stable cardiomegaly and mild chronic interstitial lung disease. Electronically Signed   By: Claudie Revering M.D.   On: 10/24/2015 07:20   Dg Chest Portable 1 View  10/22/2015  CLINICAL DATA:  Vomited last night, coughing blood EXAM: PORTABLE CHEST 1 VIEW  COMPARISON:  10/21/2015 FINDINGS: Cardiomediastinal silhouette is stable. Status post CABG. There is small left pleural effusion with hazy left basilar atelectasis or infiltrate. Mild right basilar atelectasis. Atherosclerotic calcifications of thoracic aorta again noted. No convincing pulmonary edema. IMPRESSION: Small left pleural effusion with hazy left basilar atelectasis or infiltrate. Small right basilar atelectasis. No convincing pulmonary edema. Status post CABG. Electronically Signed   By: Lahoma Crocker M.D.   On: 10/22/2015 11:14  ASSESSMENT: Iron deficiency anemia.  PLAN:    1. Iron deficiency anemia: Patient's hemoglobin and iron stores continue to be within normal limits. He last received IV iron in April 2016.  Previously, the remainder of his laboratory work was either negative or within normal limits. Because of the stability of patient's hemoglobin and iron stores, no further follow-up is necessary. Please refer him back if his hemoglobin climbs or he becomes more symptomatic. 2. Atrial fibrillation: Continue Xarelto as prescribed.   Patient expressed understanding and was in agreement with this plan. He also understands that He can call clinic at any time with any questions, concerns, or complaints.    Lloyd Huger, MD   11/08/2015 9:30 AM

## 2015-11-08 NOTE — Telephone Encounter (Signed)
Done. Thanks.

## 2015-11-09 ENCOUNTER — Telehealth: Payer: Self-pay | Admitting: Family Medicine

## 2015-11-09 NOTE — Telephone Encounter (Signed)
Paperwork faxed Jon aware

## 2015-11-09 NOTE — Telephone Encounter (Signed)
Left message letting Christian [name] know fmla paperwork has been faxed Copy for file Copy for scan Copt for pt

## 2015-11-12 ENCOUNTER — Telehealth: Payer: Self-pay

## 2015-11-12 NOTE — Telephone Encounter (Signed)
Dawn nurse with [facility] said has seen pt and pt appears depressed;pts lungs are clear and on 1L of oxygen;Dawn said pt acts depressed; no SI or HI. pts family agrees that pt is not acting himself; pt is not joking around and has "no zip". Vital signs normal and eating well. Dawn request cb.

## 2015-11-12 NOTE — Telephone Encounter (Signed)
If his sats stay in the 90s, then okay to go down to 0.5L O2.  He sometimes does better on lower O2 and worse on higher flow rate.  If this only happening today, or is this more longstanding? Is patient willing to come in and discuss? Le me know.  Thanks.

## 2015-11-12 NOTE — Telephone Encounter (Signed)
Spoke with Dawn with [facility] who says the patient has not been himself since leaving the hospital.  Patient seems to think that he should be doing much better than he is.  Dawn says she will make a note in the chart and if his mood does not improve, she will suggest that he make an appt to see Dr. Damita Dunnings.  Dawn says his sats drop into the high 80's upon moving around but then he recovers quickly back into the 90's.

## 2015-11-13 ENCOUNTER — Other Ambulatory Visit: Payer: Self-pay | Admitting: Cardiovascular Disease

## 2015-11-13 NOTE — Telephone Encounter (Signed)
Noted. Thanks.

## 2015-11-17 ENCOUNTER — Encounter: Payer: Self-pay | Admitting: Family Medicine

## 2015-11-17 ENCOUNTER — Encounter: Payer: Self-pay | Admitting: Cardiovascular Disease

## 2015-11-17 NOTE — Telephone Encounter (Signed)
Dawn nurse with [facility] said pt walked today without O2 and pulse ox went down to 83%. Pt lungs sound clear. pts daughter thinks pt has lung disease; should pt have respiratory rehab therapy. pts daughter is going to call and schedule appt with Dr Damita Dunnings; pt pulse is usually 48-50. Dawn wondered if inhaler might open lungs more even though no wheezing. Dawn request cb.

## 2015-11-18 NOTE — Telephone Encounter (Signed)
For low heart rate would suggest cut metoprolol in half - 1/2 tab bid (25mg  bid) Would recommend more regular use of oxygen throughout the day to keep O2 sats low 90s if able.  Would offer in office eval if thought inhaler needed

## 2015-11-18 NOTE — Telephone Encounter (Signed)
Message left advising Christian [name].

## 2015-11-19 ENCOUNTER — Telehealth: Payer: Self-pay

## 2015-11-19 NOTE — Telephone Encounter (Signed)
Ok to do thanks. 

## 2015-11-19 NOTE — Telephone Encounter (Signed)
Stacy PT with [facility] left v/m requesting verbal order to change home health PT order to 2 x a week for few more weeks. Pt is not progressing as hoped.

## 2015-11-20 NOTE — Telephone Encounter (Signed)
Stacey notified.  

## 2015-11-22 ENCOUNTER — Other Ambulatory Visit: Payer: Self-pay | Admitting: Cardiovascular Disease

## 2015-11-27 ENCOUNTER — Ambulatory Visit (INDEPENDENT_AMBULATORY_CARE_PROVIDER_SITE_OTHER): Payer: Medicare HMO | Admitting: Family Medicine

## 2015-11-27 ENCOUNTER — Encounter: Payer: Self-pay | Admitting: Family Medicine

## 2015-11-27 VITALS — BP 110/50 | HR 65 | Temp 98.3°F | Wt 196.0 lb

## 2015-11-27 DIAGNOSIS — J9611 Chronic respiratory failure with hypoxia: Secondary | ICD-10-CM | POA: Diagnosis not present

## 2015-11-27 DIAGNOSIS — R0602 Shortness of breath: Secondary | ICD-10-CM

## 2015-11-27 DIAGNOSIS — R5383 Other fatigue: Secondary | ICD-10-CM

## 2015-11-27 LAB — COMPREHENSIVE METABOLIC PANEL
ALBUMIN: 3.5 g/dL (ref 3.5–5.2)
ALK PHOS: 72 U/L (ref 39–117)
ALT: 11 U/L (ref 0–53)
AST: 18 U/L (ref 0–37)
BILIRUBIN TOTAL: 0.5 mg/dL (ref 0.2–1.2)
BUN: 27 mg/dL — ABNORMAL HIGH (ref 6–23)
CALCIUM: 9.3 mg/dL (ref 8.4–10.5)
CO2: 43 mEq/L — ABNORMAL HIGH (ref 19–32)
Chloride: 95 mEq/L — ABNORMAL LOW (ref 96–112)
Creatinine, Ser: 1.15 mg/dL (ref 0.40–1.50)
GFR: 66.02 mL/min (ref >60.00)
Glucose, Bld: 116 mg/dL — ABNORMAL HIGH (ref 70–99)
Potassium: 4.5 mEq/L (ref 3.5–5.1)
Sodium: 139 mEq/L (ref 135–145)
TOTAL PROTEIN: 6 g/dL (ref 6.0–8.3)

## 2015-11-27 LAB — CBC WITH DIFFERENTIAL/PLATELET
BASOS ABS: 0 10*3/uL (ref 0.0–0.1)
Basophils Relative: 0.4 % (ref 0.0–3.0)
Eosinophils Absolute: 0.1 10*3/uL (ref 0.0–0.7)
Eosinophils Relative: 1.1 % (ref 0.0–5.0)
HEMATOCRIT: 37.4 % — AB (ref 39.0–52.0)
Hemoglobin: 11.6 g/dL — ABNORMAL LOW (ref 13.0–17.0)
LYMPHS PCT: 7.5 % — AB (ref 12.0–46.0)
Lymphs Abs: 0.6 10*3/uL — ABNORMAL LOW (ref 0.7–4.0)
MCHC: 31 g/dL (ref 30.0–36.0)
MCV: 90.1 fl (ref 78.0–100.0)
MONOS PCT: 10.2 % (ref 3.0–12.0)
Monocytes Absolute: 0.8 10*3/uL (ref 0.1–1.0)
Neutro Abs: 6.7 10*3/uL (ref 1.4–7.7)
Neutrophils Relative %: 80.8 % — ABNORMAL HIGH (ref 43.0–77.0)
Platelets: 174 10*3/uL (ref 150.0–400.0)
RBC: 4.15 Mil/uL — AB (ref 4.22–5.81)
RDW: 14.8 % (ref 11.5–15.5)
WBC: 8.3 10*3/uL (ref 4.0–10.5)

## 2015-11-27 LAB — BRAIN NATRIURETIC PEPTIDE: Pro B Natriuretic peptide (BNP): 426 pg/mL — ABNORMAL HIGH (ref 0.0–100.0)

## 2015-11-27 LAB — VITAMIN B12

## 2015-11-27 NOTE — Patient Instructions (Signed)
Don't change your meds for now.  Go to the lab on the way out.  We'll contact you with your lab report. Take care.  Glad to see you.  

## 2015-11-27 NOTE — Progress Notes (Signed)
Pre visit review using our clinic review tool, if applicable. No additional management support is needed unless otherwise documented below in the visit note.  Dec in energy level.  Prev with normal B12 level, he was asking about getting a B12 shot.     Still on O2.  Diffusely weaker in the last few weeks.   He has been having more low O2 readings with exertion at home.   His energy level has been low in general since coming home from the last hospitalization.   No bleeding.  No fevers.  Weight is at baseline.  Still on diuretics QOD now.  He has some abd swelling but hasn't taking his diuretic today.  We agreed that he'd monitor this in the meantime.   L foot with a sore spot.  Putting coconut oil on it prev.   His hearing is worse but he didn't want intervention at this point.   Meds, vitals, and allergies reviewed.   ROS: Per HPI unless specifically indicated in ROS section   GEN: nad, alert and oriented HEENT: mucous membranes moist NECK: supple w/o LA CV: rrr.  PULM: ctab, no inc wob ABD: soft, +bs, no tttp EXT: 1+ edema but no skin breakdown on the L foot.  SKIN: no acute rash

## 2015-11-29 NOTE — Assessment & Plan Note (Signed)
Presumed to be multifactorial.   Still on O2. With fatigue noted.   Doesn't appear to be in acute failure.  Labs are fine/at baseline. B12 is high, so he doesn't need a B12 shot.  I think that some of his sx are still a holdover from the PNA he had relatively recently.  I don't see anything new on the labs.  At this point, I don't see a clear target to address.  I think it is reasonable to give him at least a few more days to see if he gets some of his strength/energy back. I think that some of his abd sx (ie some bloating) will improve with diuretic use and he'll monitor that.   >25 minutes spent in face to face time with patient, >50% spent in counselling or coordination of care.

## 2015-11-30 ENCOUNTER — Telehealth: Payer: Self-pay | Admitting: Family Medicine

## 2015-11-30 NOTE — Telephone Encounter (Signed)
Dawn, [facility] nurse, saw him today and said he is scared of what is happening right now with his limited mobility and there is a possible need for antidepressants and daughter would like to have [place] and Dr. Damita Dunnings speak concerning his condition. Dawn's phone number is [phone]. Or call Anderson [name].

## 2015-12-01 ENCOUNTER — Encounter: Payer: Self-pay | Admitting: Family Medicine

## 2015-12-01 ENCOUNTER — Telehealth: Payer: Self-pay

## 2015-12-01 NOTE — Telephone Encounter (Signed)
Stacy PT with [facility] left v/m; pt is supposed to be discharged from home health PT but pt is not progressing as quickly as [facility] Board hoped and [facility] Board request an extention of PT home health services. Pt does acknowledge the value of getting the PT. [place] Board said a lot of barriers; pt not motivated to get up on his own; some issues with managing oxygen tubing. Stacy request cb.

## 2015-12-01 NOTE — Telephone Encounter (Signed)
Called daughter yesterday after clinic.  B12 wnl, other labs stable for patient. Unclear if patient is depressed. He has had global dec in functional capacity, unclear if solely from deconditioning from recent illness or if depressive/adjustment sx contributing.   D/w pt's daughter.  She and her brother were en route to talk to patient and she'll update me.  If patient does endorse depressive sx, and is willing to start treatment, then we can address.   She agrees to check with patient and update me.  App help of all involved.

## 2015-12-02 ENCOUNTER — Telehealth: Payer: Self-pay | Admitting: Family Medicine

## 2015-12-02 NOTE — Telephone Encounter (Signed)
Please give the order.  Thanks.   

## 2015-12-02 NOTE — Telephone Encounter (Signed)
See mychart message from patient's daughter.  Please call her back.  Thanks for the update.  I would continue as is for now.  If patient/they notice changes, then let me know.  Thanks.

## 2015-12-02 NOTE — Telephone Encounter (Signed)
Left message on voice mail  to call back

## 2015-12-02 NOTE — Telephone Encounter (Signed)
Spoke to daughter, Anderson [name]. She said thank you for the call back. She will let us know if anything changes.

## 2015-12-02 NOTE — Telephone Encounter (Signed)
Verbal order given to [facility] by telephone.

## 2015-12-15 ENCOUNTER — Telehealth: Payer: Self-pay | Admitting: Cardiovascular Disease

## 2015-12-15 ENCOUNTER — Telehealth: Payer: Self-pay | Admitting: *Deleted

## 2015-12-15 NOTE — Telephone Encounter (Signed)
[facility]urse [facility]  Stating his hr is low 45-49 respiration  increased 22-24  Needs to know what to do Please call back.  She is not with patient she states he is stable.

## 2015-12-15 NOTE — Telephone Encounter (Signed)
Dawn called stating that she has concerns with patient's heart rate and breathing. Dawn stated that patient's Metoprolol has been cut to 25 mg twice a day and his heart rate is still in the forties. Dawn stated that even when he walks 25-40 feet his heart rate is 49. Dawn stated that he is having sob and is on 2 liters of oxygen and his lungs are clear and the right side not expanding as much. Dawn stated that she has been seeing the patient for two month and is going to have to discharge him next week because there is nothing else that she can do and has done all of the teaching that she can. Dawn stated that she talked with the family about possibly getting palliative care set up.  Dawn stated that she did not realized until after she called you that patient has a cardiologist and she will be calling him about his heart rate also.

## 2015-12-15 NOTE — Telephone Encounter (Signed)
Spoke w/ Anderson [name]. Advised her of Ryan's recommendation.  She verbalizes understanding and is agreeable.  Asked her to call back in a few days if sx do not improve.

## 2015-12-15 NOTE — Telephone Encounter (Signed)
I would typically dec the metoprolol but I'll ask for input with Dr. Rockey Situ first, given his situation.  Routed.   App cards input.

## 2015-12-15 NOTE — Telephone Encounter (Signed)
Left message for pt to call back  °

## 2015-12-15 NOTE — Telephone Encounter (Signed)
Sounds like he may benefit from decreasing metoprolol further to 12.5 mg bid. The issue with doing this is he will be less rate controlled for his chronic Afib and may experience breakthrough tachycardic episodes. However, given the symptoms and bradycardia would decrease to 12.5 mg bid for now with the possibility of cessation of BB pending chronotropic response. With less rate control he may be more SOB as well given his ICM. If lungs are clear this is less likely to be volume overload and may be more related to medication given his chronic respiratory failure. Other options include repeat DCCV in the near future in an effort to restore sinus rhythm.

## 2015-12-18 ENCOUNTER — Ambulatory Visit: Payer: Medicare HMO

## 2015-12-18 ENCOUNTER — Telehealth: Payer: Self-pay | Admitting: Family Medicine

## 2015-12-18 NOTE — Telephone Encounter (Signed)
PLEASE NOTE: All timestamps contained within this report are represented as [place] Standard Time. CONFIDENTIALTY NOTICE: This fax transmission is intended only for the addressee. It contains information that is legally privileged, confidential or otherwise protected from use or disclosure. If you are not the intended recipient, you are strictly prohibited from reviewing, disclosing, copying using or disseminating any of this information or taking any action in reliance on or regarding this information. If you have received this fax in error, please notify us immediately by telephone so that we can arrange for its return to [place]. Phone: [phone], Toll-Free: [phone], Fax: [phone] Page: 1 of 2 Call Id: [id number] [place] Patient Name: Christian [name] Gender: Male DOB: 01-23-1941 Age: 75 Y 41 M 12 D Return Phone Number: [phone] (Primary), [phone] (Secondary) Address: City/State/Zip: [place] Client [facility] - Client Client Site [place] Physician Renford Dills - MD Contact Type Call Who Is Calling Patient / Member / Family / Caregiver Call Type Triage / Clinical Caller Name Anderson [name] Relationship To Patient Daughter Return Phone Number [phone] (Primary) Chief Complaint Constipation Reason for Call Symptomatic / Request for [place] is requesting to speak to Dr. Damita Dunnings. Has been unable to have a BM in several days, oxygen level is low, is very tired. Not short of breath. Appointment Disposition EMR Appointment Not Necessary Info pasted into Epic Yes PreDisposition Call Doctor Translation No Nurse Assessment Nurse: Amalia Hailey, RN, Melissa Date/Time (Eastern Time): 12/18/2015 3:54:09 PM Confirm and document reason for call. If symptomatic, describe symptoms. You must click the next button to save  text entered. ---Caller is requesting to speak to Dr. Damita Dunnings. Has been unable to have a BM in several days, oxygen level is low, is very tired. Not short of breath. Has the patient traveled out of the country within the last 30 days? ---Not Applicable Does the patient have any new or worsening symptoms? ---Yes Will a triage be completed? ---Yes Related visit to physician within the last 2 weeks? ---No Does the PT have any chronic conditions? (i.e. diabetes, asthma, etc.) ---Yes List chronic conditions. 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 ---No Guidelines Guideline Title Affirmed Question Affirmed Notes Nurse Date/Time (Eastern Time) Weakness (Generalized) and Fatigue [1] MODERATE weakness (i.e., interferes with work, school, normal [place], Therapist, sports, Melissa 12/18/2015 4:08:19 PM PLEASE NOTE: All timestamps contained within this report are represented as [place] Standard Time. CONFIDENTIALTY NOTICE: This fax transmission is intended only for the addressee. It contains information that is legally privileged, confidential or otherwise protected from use or disclosure. If you are not the intended recipient, you are strictly prohibited from reviewing, disclosing, copying using or disseminating any of this information or taking any action in reliance on or regarding this information. If you have received this fax in error, please notify us immediately by telephone so that we can arrange for its return to [place]. Phone: [phone], Toll-Free: [phone], Fax: [phone] Page: 2 of 2 Call Id: [id number] Guidelines Guideline Title Affirmed Question Affirmed Notes Nurse Date/Time Eilene Ghazi Time) activities) AND [2] persists > 3 days Disp. Time Eilene Ghazi Time) Disposition Final User 12/18/2015 4:14:14 PM See Physician within 24 Hours Yes Amalia Hailey, RN, [name] Caller Understands:  Yes Disagree/Comply: Comply Care Advice Given Per Guideline SEE PHYSICIAN WITHIN 24 HOURS: CALL BACK IF: *  You become worse. Comments User: Colin Ina, RN Date/Time (Eastern Time): 12/18/2015 4:22:45 PM Caller reports she has noticed more decline in her dad's condition and decisions need to make about her dad's care besides her and her brother, father has once a week homecare but this is not enough help in his care regarding his day to day care. Caller requests to speak to Dr. Damita Dunnings about this. Caller informed will place a note on the chart so when Dr.Duncan is back in the office can review her request on Monday. Caller advised to f/u on Monday with the office if does not get a call back. Referred to the [facility] for Saturday clinic appt. Caller reports will talk with her dad about this and not sure he will go. Referrals GO TO FACILITY UNDECIDED [facility] GO TO FACILITY UNDECIDED [facility]

## 2015-12-18 NOTE — Telephone Encounter (Signed)
Noted. Agree that he needs follow-up with Dr. Damita Dunnings with family present to discuss direction of care in the future. Would also recommend stool softener such as Colace to help move his bowels. If he is not eating much then he may not have as frequent bowel movements.

## 2015-12-18 NOTE — Telephone Encounter (Signed)
Spoke with daughter.  Appt scheduled for Thursday, June 15th.

## 2015-12-18 NOTE — Telephone Encounter (Signed)
[facility]     Patient Name: Christian [name] [name] Christian [name]    [name] [place] - Day    Physician Renford Dills - MD    Contact Type Call    Who Is Calling Patient / Member / Family / Caregiver    Call Type Triage / Clinical    Caller Name Anderson [name]    Relationship To Patient Daughter  Gender: Male Return Phone Number [phone] (Primary)  DOB: 05/05/1941  Chief Complaint Constipation  Age: 75 Y 13 M 12 D Reason for Call Symptomatic / Request for Health Information  Return Phone Number: [phone] (Primary), [phone] (Secondary) Initial Comment Caller is requesting to speak to Dr. Damita Dunnings. Has been unable to have a BM in several days, oxygen level is low, is very tired. Not short of breath.   Address:  PreDisposition Call Doctor  City/State/Zip: [place]  Translation No    Nurse Assessment  Nurse: Christian Hailey, RN, Melissa Date/Time (Eastern Time): 12/18/2015 3:54:09 PM  Confirm and document reason for call. If symptomatic, describe symptoms. You must click the next button to save text entered. ---Caller is requesting to speak to Dr. Damita Dunnings. Has been unable to have a BM in several days, oxygen level is low, is very tired. Not short of breath.  Has the patient traveled out of the country within the last 30 days? ---Not Applicable  Does the patient have any new or worsening symptoms? ---Yes  Will a triage be completed? ---Yes  Related visit to physician within the last 2 weeks? ---No  Does the PT have any chronic conditions? (i.e. diabetes, asthma, etc.) ---Yes  List chronic conditions. 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 ---No    Guidelines      Guideline Title Affirmed Question Affirmed Notes Nurse Date/Time  (Eastern Time)  Weakness (Generalized) and Fatigue [1] MODERATE weakness (i.e., interferes with work, school, normal activities) AND [2] persists > 3 days  Christian Hailey, RN, Melissa 12/18/2015 4:08:19 PM  Disp. Time Eilene Ghazi Time) Disposition Final User         12/18/2015 4:14:14 PM See Physician within 24 Hours Yes Christian Hailey, RN, [name]         Caller Understands: Yes   Disagree/Comply: Comply      Care Advice Given Per Guideline         SEE PHYSICIAN WITHIN 24 HOURS: CALL BACK IF: * You become worse.           Comments  User: Colin Ina, RN Date/Time (Eastern Time): 12/18/2015 4:22:45 PM  Caller reports she has noticed more decline in her dad's condition and decisions need to make about her dad's care besides her and her brother, father has once a week homecare but this is not enough help in his care regarding his day to day care. Caller requests to speak to Dr. Damita Dunnings about this. Caller informed will place a note on the chart so when Dr.Duncan is back in the office can review her request on Monday. Caller advised to f/u on Monday with the office if does not get a call back. Referred to the [facility] for Saturday clinic appt. Caller reports will talk with her dad about this and not sure he will go.  Referrals  GO TO FACILITY UNDECIDED   [facility]   GO TO FACILITY UNDECIDED   [facility]

## 2015-12-20 NOTE — Telephone Encounter (Signed)
Please see what issues they have that need to be addressed before the OV Thursday and let me know.  Thanks.

## 2015-12-20 NOTE — Telephone Encounter (Signed)
Agree with lower dose metoprolol 12.5 BID Would make sure he is taking torsemide daily when he has leg edema and SOB He tends to miss days

## 2015-12-21 NOTE — Telephone Encounter (Signed)
Left message on machine for patient to contact the office.   

## 2015-12-21 NOTE — Telephone Encounter (Signed)
Duly noted, I would have him continue on the lower dose of metoprolol per cards in the meantime (12.5mg  BID) and make sure he is taking torsemide daily when he has leg edema and SOB.  We'll talk at the [place].  Thanks.

## 2015-12-21 NOTE — Telephone Encounter (Signed)
Spoke with daughter.  Daughter states that the patient is having a lot of problems keeping his O2 levels up.  Daughter says that the nurse has spoken with them and is suggesting that Palliative Care be instituted.

## 2015-12-22 ENCOUNTER — Ambulatory Visit: Payer: Medicare HMO

## 2015-12-22 ENCOUNTER — Telehealth: Payer: Self-pay

## 2015-12-22 NOTE — Telephone Encounter (Signed)
Noted  

## 2015-12-22 NOTE — Telephone Encounter (Signed)
Dawn with [facility] said pt is being discharged from [facility] 12-22-15. There is nothing more HH can do for pt. Pt is deteriorating.  Recommended to pt and pts daughter to consider hospice care; Dawn said has bad connotation to pt. Pts pulse rate stays 45-55. Pt O2 sats go into the 80s upon walking even when on 2 L of oxygen. FYI to Dr Damita Dunnings. Pt has appt with Dr Damita Dunnings on 12/24/15.

## 2015-12-22 NOTE — Telephone Encounter (Signed)
Left detailed message on voicemail of son Wille Glaser).  Daughter is out of town until Thursday.

## 2015-12-22 NOTE — Telephone Encounter (Signed)
He may have to go up to 2.5 to 3 L per minute with exertion in the meantime. I would be judicious on use of O2 in the meantime and not leave him on a high flow rate if not needed, if not exerting himself.  We'll talk at the [facility].  Thanks.

## 2015-12-23 ENCOUNTER — Ambulatory Visit: Payer: Medicare HMO | Admitting: Internal Medicine

## 2015-12-23 ENCOUNTER — Emergency Department (HOSPITAL_COMMUNITY): Payer: Medicare HMO

## 2015-12-23 ENCOUNTER — Inpatient Hospital Stay (HOSPITAL_COMMUNITY)
Admission: EM | Admit: 2015-12-23 | Discharge: 2016-01-09 | DRG: 208 | Disposition: E | Payer: Medicare HMO | Attending: Pulmonary Disease | Admitting: Pulmonary Disease

## 2015-12-23 DIAGNOSIS — J9621 Acute and chronic respiratory failure with hypoxia: Secondary | ICD-10-CM | POA: Diagnosis present

## 2015-12-23 DIAGNOSIS — N183 Chronic kidney disease, stage 3 (moderate): Secondary | ICD-10-CM | POA: Diagnosis not present

## 2015-12-23 DIAGNOSIS — R32 Unspecified urinary incontinence: Secondary | ICD-10-CM | POA: Diagnosis not present

## 2015-12-23 DIAGNOSIS — K7290 Hepatic failure, unspecified without coma: Secondary | ICD-10-CM | POA: Diagnosis present

## 2015-12-23 DIAGNOSIS — Z7189 Other specified counseling: Secondary | ICD-10-CM | POA: Insufficient documentation

## 2015-12-23 DIAGNOSIS — J690 Pneumonitis due to inhalation of food and vomit: Secondary | ICD-10-CM | POA: Diagnosis not present

## 2015-12-23 DIAGNOSIS — K219 Gastro-esophageal reflux disease without esophagitis: Secondary | ICD-10-CM | POA: Diagnosis present

## 2015-12-23 DIAGNOSIS — H9190 Unspecified hearing loss, unspecified ear: Secondary | ICD-10-CM | POA: Diagnosis present

## 2015-12-23 DIAGNOSIS — J189 Pneumonia, unspecified organism: Secondary | ICD-10-CM

## 2015-12-23 DIAGNOSIS — R5381 Other malaise: Secondary | ICD-10-CM | POA: Insufficient documentation

## 2015-12-23 DIAGNOSIS — Z87442 Personal history of urinary calculi: Secondary | ICD-10-CM

## 2015-12-23 DIAGNOSIS — E1122 Type 2 diabetes mellitus with diabetic chronic kidney disease: Secondary | ICD-10-CM | POA: Diagnosis present

## 2015-12-23 DIAGNOSIS — Z7901 Long term (current) use of anticoagulants: Secondary | ICD-10-CM

## 2015-12-23 DIAGNOSIS — Z951 Presence of aortocoronary bypass graft: Secondary | ICD-10-CM

## 2015-12-23 DIAGNOSIS — Z66 Do not resuscitate: Secondary | ICD-10-CM | POA: Diagnosis present

## 2015-12-23 DIAGNOSIS — I959 Hypotension, unspecified: Secondary | ICD-10-CM | POA: Diagnosis not present

## 2015-12-23 DIAGNOSIS — R0600 Dyspnea, unspecified: Secondary | ICD-10-CM | POA: Insufficient documentation

## 2015-12-23 DIAGNOSIS — G9340 Encephalopathy, unspecified: Secondary | ICD-10-CM | POA: Diagnosis not present

## 2015-12-23 DIAGNOSIS — E785 Hyperlipidemia, unspecified: Secondary | ICD-10-CM | POA: Diagnosis present

## 2015-12-23 DIAGNOSIS — Y95 Nosocomial condition: Secondary | ICD-10-CM | POA: Diagnosis present

## 2015-12-23 DIAGNOSIS — J9690 Respiratory failure, unspecified, unspecified whether with hypoxia or hypercapnia: Secondary | ICD-10-CM

## 2015-12-23 DIAGNOSIS — Z87891 Personal history of nicotine dependence: Secondary | ICD-10-CM

## 2015-12-23 DIAGNOSIS — I130 Hypertensive heart and chronic kidney disease with heart failure and stage 1 through stage 4 chronic kidney disease, or unspecified chronic kidney disease: Secondary | ICD-10-CM | POA: Diagnosis present

## 2015-12-23 DIAGNOSIS — Z9981 Dependence on supplemental oxygen: Secondary | ICD-10-CM

## 2015-12-23 DIAGNOSIS — I482 Chronic atrial fibrillation: Secondary | ICD-10-CM | POA: Diagnosis present

## 2015-12-23 DIAGNOSIS — I5032 Chronic diastolic (congestive) heart failure: Secondary | ICD-10-CM | POA: Diagnosis present

## 2015-12-23 DIAGNOSIS — Z833 Family history of diabetes mellitus: Secondary | ICD-10-CM

## 2015-12-23 DIAGNOSIS — I2510 Atherosclerotic heart disease of native coronary artery without angina pectoris: Secondary | ICD-10-CM | POA: Diagnosis present

## 2015-12-23 DIAGNOSIS — Z888 Allergy status to other drugs, medicaments and biological substances status: Secondary | ICD-10-CM

## 2015-12-23 DIAGNOSIS — Z515 Encounter for palliative care: Secondary | ICD-10-CM | POA: Diagnosis not present

## 2015-12-23 DIAGNOSIS — Z8249 Family history of ischemic heart disease and other diseases of the circulatory system: Secondary | ICD-10-CM

## 2015-12-23 DIAGNOSIS — T68XXXA Hypothermia, initial encounter: Secondary | ICD-10-CM | POA: Diagnosis present

## 2015-12-23 DIAGNOSIS — Z79899 Other long term (current) drug therapy: Secondary | ICD-10-CM

## 2015-12-23 DIAGNOSIS — E11649 Type 2 diabetes mellitus with hypoglycemia without coma: Secondary | ICD-10-CM | POA: Diagnosis present

## 2015-12-23 DIAGNOSIS — J9622 Acute and chronic respiratory failure with hypercapnia: Secondary | ICD-10-CM | POA: Diagnosis not present

## 2015-12-23 DIAGNOSIS — Z4659 Encounter for fitting and adjustment of other gastrointestinal appliance and device: Secondary | ICD-10-CM

## 2015-12-23 DIAGNOSIS — E876 Hypokalemia: Secondary | ICD-10-CM | POA: Diagnosis present

## 2015-12-23 HISTORY — DX: Chronic respiratory failure with hypoxia: J96.11

## 2015-12-23 LAB — CBC WITH DIFFERENTIAL/PLATELET
BASOS PCT: 0 %
Basophils Absolute: 0 10*3/uL (ref 0.0–0.1)
EOS ABS: 0 10*3/uL (ref 0.0–0.7)
Eosinophils Relative: 1 %
HCT: 43.6 % (ref 39.0–52.0)
Hemoglobin: 12 g/dL — ABNORMAL LOW (ref 13.0–17.0)
LYMPHS ABS: 0.5 10*3/uL — AB (ref 0.7–4.0)
Lymphocytes Relative: 6 %
MCH: 27.3 pg (ref 26.0–34.0)
MCHC: 27.5 g/dL — ABNORMAL LOW (ref 30.0–36.0)
MCV: 99.3 fL (ref 78.0–100.0)
MONO ABS: 0.3 10*3/uL (ref 0.1–1.0)
MONOS PCT: 4 %
NEUTROS PCT: 89 %
Neutro Abs: 6.6 10*3/uL (ref 1.7–7.7)
PLATELETS: 184 10*3/uL (ref 150–400)
RBC: 4.39 MIL/uL (ref 4.22–5.81)
RDW: 13.6 % (ref 11.5–15.5)
WBC: 7.4 10*3/uL (ref 4.0–10.5)

## 2015-12-23 LAB — I-STAT CG4 LACTIC ACID, ED: Lactic Acid, Venous: 0.81 mmol/L (ref 0.5–2.0)

## 2015-12-23 LAB — I-STAT ARTERIAL BLOOD GAS, ED
ACID-BASE EXCESS: 15 mmol/L — AB (ref 0.0–2.0)
Bicarbonate: 41.1 mEq/L — ABNORMAL HIGH (ref 20.0–24.0)
O2 SAT: 100 %
TCO2: 43 mmol/L (ref 0–100)
pCO2 arterial: 48.8 mmHg — ABNORMAL HIGH (ref 35.0–45.0)
pH, Arterial: 7.52 — ABNORMAL HIGH (ref 7.350–7.450)
pO2, Arterial: 510 mmHg — ABNORMAL HIGH (ref 80.0–100.0)

## 2015-12-23 LAB — I-STAT TROPONIN, ED: Troponin i, poc: 0.02 ng/mL (ref 0.00–0.08)

## 2015-12-23 MED ORDER — PROPOFOL 1000 MG/100ML IV EMUL
0.0000 ug/kg/min | INTRAVENOUS | Status: DC
  Administered 2015-12-23: 5 ug/kg/min via INTRAVENOUS
  Administered 2015-12-24: 15 ug/kg/min via INTRAVENOUS
  Administered 2015-12-24: 25 ug/kg/min via INTRAVENOUS
  Administered 2015-12-25: 10 ug/kg/min via INTRAVENOUS
  Filled 2015-12-23 (×4): qty 100

## 2015-12-23 MED ORDER — FENTANYL CITRATE (PF) 100 MCG/2ML IJ SOLN: 50.0000 ug | INTRAMUSCULAR | Status: DC | PRN

## 2015-12-23 MED ORDER — VANCOMYCIN HCL 10 G IV SOLR
1500.0000 mg | INTRAVENOUS | Status: AC
  Administered 2015-12-23: 1500 mg via INTRAVENOUS
  Filled 2015-12-23: qty 1500

## 2015-12-23 MED ORDER — CEFEPIME HCL 1 G IJ SOLR
1.0000 g | Freq: Once | INTRAMUSCULAR | Status: AC
  Administered 2015-12-23: 1 g via INTRAVENOUS
  Filled 2015-12-23: qty 1

## 2015-12-23 MED ORDER — PROPOFOL 1000 MG/100ML IV EMUL
INTRAVENOUS | Status: AC
  Administered 2015-12-24: 25 ug/kg/min via INTRAVENOUS
  Filled 2015-12-23: qty 100

## 2015-12-23 NOTE — H&P (Signed)
PULMONARY / CRITICAL CARE MEDICINE   Name: Christian [name] MRN: [id number] DOB: 07-25-1940    ADMISSION DATE:  12/28/2015 CONSULTATION DATE:  12/10/2015  REFERRING MD:  EDP  CHIEF COMPLAINT:  Acute Hypoxic Respiratory Failure  HISTORY OF PRESENT ILLNESS:  Per the electronic medical record patient became increasingly lethargic and "wasn't acting like himself". Patient was unable to hold a fork dinner. Family called EMS. On arrival patient was saturating 84% on room air and drooling. Patient was unresponsive to sternal rub.. Patient was subsequently intubated in the emergency department after arrival and assessment by emergency department physician. Patient's family reports that he was hospitalized in February for aspiration pneumonia. Over the last month they have noticed a gradual decline with increasing fatigue and dyspnea on exertion. Patient had been compliant with his diuretic medications but family do report increasing edema that they know today. The patient has also had his Lopressor adjusted recently as well for persistent bradycardia. At approximately 5 PM patient was noted to have decreased level of consciousness and incontinence but was able to respond. Family reports that after contacting him later in the evening his altered mentation became evident with repeated dropping of the phone. Patient's family reports that they had planned to discuss palliative medicine at appointment with his general practitioner tomorrow.  PAST MEDICAL HISTORY :  Past Medical History  Diagnosis Date  . Coronary artery disease     a. 2009 s/p CABG x 3; 09/2007 Cath: patent grafts.  . Type II diabetes mellitus ([place])   . Hyperlipidemia   . Hypertensive heart disease   . GERD (gastroesophageal reflux disease)   . Arthritis   . Diastolic CHF, chronic ([place])     a. 04/2012 Echo: EF >55%, no rwma, mod conc LVH, mod dil LA, Triv MR/TR.  Marland Kitchen Nephrolithiasis   . Chronic atrial fibrillation ([place])     a. DCCV  04/2010-->reverted to AF by 2012;  b. CHA2DS2VASc = 4->Xarelto.  . CO2 retention     during admission to [facility] 2013  . CKD (chronic kidney disease), stage III   . Hepatic encephalopathy ([place])     2013  . 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 Chronic respiratory failure with hypoxia ([facility])     PAST SURGICAL HISTORY: Past Surgical History  Procedure Laterality Date  . Coronary artery bypass graft  2009    x 3 @ [facility]  . Cardiac catheterization  2009    showing patent grafts with elevated pulmonary pressures  . Colonoscopy  2011    negative per report  . Iron transfusion      Allergies  Allergen Reactions  . Ambien [Zolpidem Tartrate]     Over sedation  . Crestor [Rosuvastatin Calcium]     myalgias  . Other     Sedative or medications for sleep.  . Pradaxa [Dabigatran Etexilate Mesylate]     Inflammation in throat.  . Tramadol     Intolerant but not an allergy; sedation    No current facility-administered medications on file prior to encounter.   Current Outpatient Prescriptions on File Prior to Encounter  Medication Sig  . acetaminophen (TYLENOL) 500 MG tablet Take 500 mg by mouth every 6 (six) hours as needed.  . Cyanocobalamin (VITAMIN B-12) 6000 MCG SUBL Place under the tongue.  Marland Kitchen glucosamine-chondroitin 500-400 MG tablet Take 1 tablet by mouth daily.  Marland Kitchen ipratropium (ATROVENT) 0.03 % nasal spray Place 2 sprays into both nostrils 3 (three) times daily.  Marland Kitchen  menthol-cetylpyridinium (CEPACOL) 3 MG lozenge Take 1 lozenge by mouth as needed for sore throat.  . metoprolol tartrate (LOPRESSOR) 25 MG tablet Take 12.5 mg by mouth 2 (two) times daily.   . MULTAQ 400 MG tablet TAKE 1 TABLET(400 MG) BY MOUTH TWICE DAILY WITH A MEAL  . Multiple Vitamins-Minerals (CENTRUM SILVER PO) Take 1 tablet by mouth daily.   . nitroGLYCERIN (NITROSTAT) 0.4 MG SL tablet Place 0.4 mg under the tongue every 5 (five) minutes as needed.    . NON FORMULARY Oxygen @ 2  liters at bedtime.  Marland Kitchen omeprazole (PRILOSEC) 20 MG capsule Take 20 mg by mouth daily.  . potassium chloride (K-DUR,KLOR-CON) 10 MEQ tablet Take 10 mEq by mouth every other day.  . rivaroxaban (XARELTO) 20 MG TABS tablet Take 1 tablet (20 mg total) by mouth daily.  Marland Kitchen spironolactone (ALDACTONE) 25 MG tablet Take 25 mg by mouth every other day.   . tamsulosin (FLOMAX) 0.4 MG CAPS capsule Take 0.4 mg by mouth daily after supper.  . torsemide (DEMADEX) 20 MG tablet Take 20 mg by mouth every other day.  . triamcinolone cream (KENALOG) 0.1 % Apply 1 application topically 2 (two) times daily as needed.  . [DISCONTINUED] dabigatran (PRADAXA) 150 MG CAPS Take 1 capsule (150 mg total) by mouth every 12 (twelve) hours.  . [DISCONTINUED] lisinopril (PRINIVIL,ZESTRIL) 10 MG tablet Take 1 tablet (10 mg total) by mouth daily.  . [DISCONTINUED] metFORMIN (GLUCOPHAGE) 500 MG tablet Take 1 tablet (500 mg total) by mouth 2 (two) times daily with a meal. Intolerant of >500mg  per day  . [DISCONTINUED] sotalol (BETAPACE) 120 MG tablet Take 1 tablet (120 mg total) by mouth 2 (two) times daily.    FAMILY HISTORY:  Family History  Problem Relation Age of Onset  . Heart disease Brother   . Arthritis Mother   . Hypertension Mother   . Aneurysm Mother   . Heart disease Father   . Hyperlipidemia Father   . Stroke Father   . Diabetes Father   . Heart failure Father   . Heart disease Sister   . Colon cancer Neg Hx   . Prostate cancer Neg Hx   . Heart disease Brother     SOCIAL HISTORY: Social History  Substance Use Topics  . Smoking status: Former Smoker -- 1.50 packs/day for 30 years    Types: Cigarettes    Quit date: 07/11/1981  . Smokeless tobacco: Never Used  . Alcohol Use: No    REVIEW OF SYSTEMS:  Unable to obtain given intubation and sedation.  SUBJECTIVE: As above.  VITAL SIGNS: BP 106/55 mmHg  Pulse 82  Resp 17  Wt 200 lb (90.719 kg)  SpO2 100%  HEMODYNAMICS:    VENTILATOR  SETTINGS: Vent Mode:  [-] PRVC FiO2 (%):  [50 %-100 %] 50 % Set Rate:  [14 bmp] 14 bmp Vt Set:  [620 mL] 620 mL PEEP:  [5 cmH20] 5 cmH20 Plateau Pressure:  [30 cmH20] 30 cmH20  INTAKE / OUTPUT:    PHYSICAL EXAMINATION: General:  Sedated. No acute distress. Family at bedside.  Integument:  Warm & dry. No rash on exposed skin. Some dermal exfoliation on his feet bilaterally. Lymphatics:  No appreciated cervical or supraclavicular lymphadenoapthy. HEENT:  No scleral injection or icterus. Endotracheal tube in place.  Cardiovascular:  Tachycardic. Pitting lower extremity edema.  Pulmonary:  Diminished and coarse breath sounds bilaterally. Symmetric chest wall rise on ventilator. Abdomen: Soft. Normal bowel sounds. Nondistended. Musculoskeletal:  Normal bulk.  No joint deformity or effusion appreciated. Neurological:  Patient overbreathing the vent. Pupils reactive. Withdraws extremities to pain. Psychiatric:  Unable to assess given intubation and sedation.  LABS:  BMET  Recent Labs Lab 12/28/2015 2230  NA 140  K 5.1  CL 93*  CO2 44*  BUN 26*  CREATININE 1.32*  GLUCOSE 191*    Electrolytes  Recent Labs Lab 12/17/2015 2230  CALCIUM 9.4    CBC  Recent Labs Lab 12/19/2015 2230  WBC 7.4  HGB 12.0*  HCT 43.6  PLT 184    Coag's No results for input(s): APTT, INR in the last 168 hours.  Sepsis Markers  Recent Labs Lab 01/06/2016 2251  LATICACIDVEN 0.81    ABG  Recent Labs Lab 12/28/2015 2338  PHART 7.520*  PCO2ART 48.8*  PO2ART 510.0*    Liver Enzymes  Recent Labs Lab 12/27/2015 2230  AST 29  ALT 19  ALKPHOS 80  BILITOT 0.9  ALBUMIN 3.9    Cardiac Enzymes No results for input(s): TROPONINI, PROBNP in the last 168 hours.  Glucose No results for input(s): GLUCAP in the last 168 hours.  Imaging Dg Chest Portable 1 View  12/20/2015  CLINICAL DATA:  Endotracheal tube placement.  Initial encounter. EXAM: PORTABLE CHEST 1 VIEW COMPARISON:  Chest  radiograph performed 10/27/2015 FINDINGS: The patient's endotracheal tube is seen ending 2 cm above the carina. The lungs are mildly hypoexpanded. Right midlung and left basilar airspace opacities raise concern for pneumonia. There is no evidence of pleural effusion or pneumothorax. The cardiomediastinal silhouette is mildly enlarged. The patient is status post median sternotomy, with evidence of prior CABG. No acute osseous abnormalities are seen. IMPRESSION: 1. Endotracheal tube seen ending 2 cm above the carina. 2. Lungs mildly hypoexpanded. Right midlung and left basilar airspace opacities raise concern for pneumonia. 3. Mild cardiomegaly. Electronically Signed   By: Garald Balding M.D.   On: 01/02/2016 22:58     STUDIES:  PORT CXR 6/14:  Endotracheal tube in acceptable position. Low lung volumes. Blunting of left costophrenic angle & questionable right midlung opacity. CT HEAD W/O 6/15 >> TTE 6/15 >>  MICROBIOLOGY: Blood Ctx x2 6/14 >> Urine Ctx 6/14 >> MRSA PCR 6/14 >> Tracheal Asp Ctx 6/14 >>  ANTIBIOTICS: Cefepime 6/14 >> Vancomycin 6/14 >>  SIGNIFICANT EVENTS: 6/14 - Admit  LINES/TUBES: OETT 7.5 6/14 >> Foley 6/14 >> PIV x3  DISCUSSION:  75 year old male with acute on chronic hypoxic and hypercarbic respiratory failure. Unclear etiology as patient's BNP does not appear above baseline. CT head pending for possible intracranial hemorrhage given chronic Xarelto with chronic renal failure. Continuing empiric cataracts with low threshold to de-escalate depending upon Procalcitonin and culture data. Lengthy discussion with the patient's family at bedside regarding goals of care. Plan to consult palliative medicine in the morning and patient will remain intubated but be DO NOT RESUSCITATE otherwise.  ASSESSMENT / PLAN:  PULMONARY A: Acute on Chronic Hypoxic Respiratory Failure - Baseline 2 L/m requirement. Chronic Hypercarbic Respiratory Failure - Previously followed with [facility]. On  nocturnal NIPPV. Possible Aspiration Pneumonia  P:   Full Vent Support SBT & WUA  In AM  CARDIOVASCULAR A:  H/O Chronic Diastolic CHF H/O Hyperlipidemia H/O HTN H/O Chronic A fib - On Xarelto. H/O CAD - S/P 3 Vessel CABG 2009.  P:  Telemetry Monitoring Vitals per unit protocol Trending Troponin I q6hr Checking TTE Holding Xarelto Holding Lopresssor, Aldactone, Demadex, & Multaq  RENAL A:   H/O CKD Stage III  P:   Trending UOP Daily electrolytes & renal function Replacing electrolytes as indicated  GASTROINTESTINAL A:   No acute issues.  P:   NPO Pepcid IV daily  HEMATOLOGIC A:   No acute issues. Systemic Anticoagulation - With Xarelto.  P:  Trending cell counts daily SCDs Holding Xarelto.  INFECTIOUS A:   Possible Aspiration Pneumonia  P:   Empiric Vancomycin & Cefepime Day #1 Awaiting culture results Trending PCT per algorithm Low threshold to de-escalate antibiotics  ENDOCRINE A:   H/O DM Type II  P:   Accu-Checks q4hr Low dose SSI per algorithm Checking Hgb A1c  NEUROLOGIC A:   Acute Encephalopathy  Sedation on Ventilator H/O Hepatic Encephalopathy  P:   RASS goal: 0 to -1 CT Head w/o Propofol gtt Fentanyl IV prn  FAMILY  - Updates: Extensive family discussion with children at bedside 6/15 by Dr. Ashok Cordia.  - Inter-disciplinary family meet or Palliative Care meeting due by: 6/21  I have spent a total of 65 minutes of critical care time today caring for the patient, discussing the plan of care with family at bedside, and reviewing the patient's electronic medical record.   Sonia Baller Ashok Cordia, M.D. [facility] Pager:  [phone] After 3pm or if no response, call [phone] 12:38 AM 12/24/2015

## 2015-12-23 NOTE — Progress Notes (Signed)
Pharmacy Antibiotic Note  Christian [name] is a 75 y.o. male admitted on 12/17/2015 with HCAP.  Pharmacy has been consulted for Vancomycin and Cefepime dosing.  Plan: Cefepime 1gm IV q8h Vancomycin 1500mg  IV now then 750mg  IV q12h Will f/u micro data, renal function, and pt's clinical condition Vanc trough prn  Weight: 200 lb (90.719 kg)  No data recorded.   Recent Labs Lab 12/10/2015 2230 12/13/2015 2251  WBC 7.4  --   CREATININE 1.32*  --   LATICACIDVEN  --  0.81    Estimated Creatinine Clearance: 54.7 mL/min (by C-G formula based on Cr of 1.32).    Allergies  Allergen Reactions  . Ambien [Zolpidem Tartrate]     Over sedation  . Crestor [Rosuvastatin Calcium]     myalgias  . Other     Sedative or medications for sleep.  . Pradaxa [Dabigatran Etexilate Mesylate]     Inflammation in throat.  . Tramadol     Intolerant but not an allergy; sedation    Antimicrobials this admission: 6/14 Cefepime >>  6/15 Vanc >>   Dose adjustments this admission: n/a  Microbiology results: 6/14 BCx x2:   UCx:    Thank you for allowing pharmacy to be a part of this patient's care.  Sherlon Handing, PharmD, BCPS Clinical pharmacist, pager [phone] 12/24/2015 1:00 AM

## 2015-12-23 NOTE — ED Notes (Addendum)
24 at the lip   Pt intubated Color changed positive Bilateral breath sounds heard

## 2015-12-23 NOTE — ED Notes (Signed)
Patients family reported he became increasingly lethargic today and "wasn't acting like himself". Patient wasn't able to hold fork during dinner. Family called EMS. 02 sat 84% on room  Air on arrival. Patient drooling on arrival to ED. Unresponsive to sternal rub. MD at bedside. Plan to intubate patient. Family not in ED at this time.

## 2015-12-23 NOTE — Progress Notes (Signed)
Chaplain met family in waiting room and escorted them to consultation room.  Listened to family and personal stories of their father's resilience and ability to recover each time he was thought to be gravely ill.  Present:  Daughter & husband, son, mother of these adults (former wife of patient.)  Dr. Alvino Chapel spoke with family...then family took more time to discuss options, history, etc.  With RN approval, Chaplain escorted family to patient room.  ICU MD was in room and began conversations with them.  Will follow as needed.  Rev. [place], [place]

## 2015-12-23 NOTE — ED Notes (Addendum)
80 mg of roocc is in

## 2015-12-23 NOTE — ED Notes (Addendum)
20 mg of atomidate is in

## 2015-12-24 ENCOUNTER — Ambulatory Visit: Payer: Medicare HMO | Admitting: Family Medicine

## 2015-12-24 ENCOUNTER — Other Ambulatory Visit (HOSPITAL_COMMUNITY): Payer: Medicare HMO

## 2015-12-24 ENCOUNTER — Inpatient Hospital Stay (HOSPITAL_COMMUNITY): Payer: Medicare HMO

## 2015-12-24 ENCOUNTER — Emergency Department (HOSPITAL_COMMUNITY): Payer: Medicare HMO

## 2015-12-24 ENCOUNTER — Encounter (HOSPITAL_COMMUNITY): Payer: Self-pay | Admitting: Pulmonary Disease

## 2015-12-24 DIAGNOSIS — Z7189 Other specified counseling: Secondary | ICD-10-CM | POA: Diagnosis not present

## 2015-12-24 DIAGNOSIS — R5381 Other malaise: Secondary | ICD-10-CM | POA: Diagnosis not present

## 2015-12-24 DIAGNOSIS — E11649 Type 2 diabetes mellitus with hypoglycemia without coma: Secondary | ICD-10-CM | POA: Diagnosis present

## 2015-12-24 DIAGNOSIS — I130 Hypertensive heart and chronic kidney disease with heart failure and stage 1 through stage 4 chronic kidney disease, or unspecified chronic kidney disease: Secondary | ICD-10-CM | POA: Diagnosis present

## 2015-12-24 DIAGNOSIS — Z951 Presence of aortocoronary bypass graft: Secondary | ICD-10-CM | POA: Diagnosis not present

## 2015-12-24 DIAGNOSIS — J9622 Acute and chronic respiratory failure with hypercapnia: Secondary | ICD-10-CM | POA: Diagnosis present

## 2015-12-24 DIAGNOSIS — I509 Heart failure, unspecified: Secondary | ICD-10-CM | POA: Diagnosis not present

## 2015-12-24 DIAGNOSIS — I4891 Unspecified atrial fibrillation: Secondary | ICD-10-CM | POA: Diagnosis not present

## 2015-12-24 DIAGNOSIS — T68XXXA Hypothermia, initial encounter: Secondary | ICD-10-CM | POA: Diagnosis present

## 2015-12-24 DIAGNOSIS — Z888 Allergy status to other drugs, medicaments and biological substances status: Secondary | ICD-10-CM | POA: Diagnosis not present

## 2015-12-24 DIAGNOSIS — E1122 Type 2 diabetes mellitus with diabetic chronic kidney disease: Secondary | ICD-10-CM | POA: Diagnosis present

## 2015-12-24 DIAGNOSIS — I482 Chronic atrial fibrillation: Secondary | ICD-10-CM | POA: Diagnosis present

## 2015-12-24 DIAGNOSIS — Z7901 Long term (current) use of anticoagulants: Secondary | ICD-10-CM | POA: Diagnosis not present

## 2015-12-24 DIAGNOSIS — N183 Chronic kidney disease, stage 3 (moderate): Secondary | ICD-10-CM | POA: Diagnosis present

## 2015-12-24 DIAGNOSIS — E876 Hypokalemia: Secondary | ICD-10-CM | POA: Diagnosis present

## 2015-12-24 DIAGNOSIS — J690 Pneumonitis due to inhalation of food and vomit: Principal | ICD-10-CM

## 2015-12-24 DIAGNOSIS — K7290 Hepatic failure, unspecified without coma: Secondary | ICD-10-CM | POA: Diagnosis present

## 2015-12-24 DIAGNOSIS — Y95 Nosocomial condition: Secondary | ICD-10-CM | POA: Diagnosis present

## 2015-12-24 DIAGNOSIS — I2510 Atherosclerotic heart disease of native coronary artery without angina pectoris: Secondary | ICD-10-CM | POA: Diagnosis present

## 2015-12-24 DIAGNOSIS — Z66 Do not resuscitate: Secondary | ICD-10-CM | POA: Diagnosis present

## 2015-12-24 DIAGNOSIS — R0600 Dyspnea, unspecified: Secondary | ICD-10-CM | POA: Diagnosis not present

## 2015-12-24 DIAGNOSIS — Z8249 Family history of ischemic heart disease and other diseases of the circulatory system: Secondary | ICD-10-CM | POA: Diagnosis not present

## 2015-12-24 DIAGNOSIS — J9621 Acute and chronic respiratory failure with hypoxia: Secondary | ICD-10-CM | POA: Diagnosis present

## 2015-12-24 DIAGNOSIS — Z79899 Other long term (current) drug therapy: Secondary | ICD-10-CM | POA: Diagnosis not present

## 2015-12-24 DIAGNOSIS — H9190 Unspecified hearing loss, unspecified ear: Secondary | ICD-10-CM | POA: Diagnosis present

## 2015-12-24 DIAGNOSIS — Z9981 Dependence on supplemental oxygen: Secondary | ICD-10-CM | POA: Diagnosis not present

## 2015-12-24 DIAGNOSIS — R32 Unspecified urinary incontinence: Secondary | ICD-10-CM | POA: Diagnosis not present

## 2015-12-24 DIAGNOSIS — Z87442 Personal history of urinary calculi: Secondary | ICD-10-CM | POA: Diagnosis not present

## 2015-12-24 DIAGNOSIS — Z87891 Personal history of nicotine dependence: Secondary | ICD-10-CM | POA: Diagnosis not present

## 2015-12-24 DIAGNOSIS — K219 Gastro-esophageal reflux disease without esophagitis: Secondary | ICD-10-CM | POA: Diagnosis present

## 2015-12-24 DIAGNOSIS — J189 Pneumonia, unspecified organism: Secondary | ICD-10-CM | POA: Diagnosis present

## 2015-12-24 DIAGNOSIS — E785 Hyperlipidemia, unspecified: Secondary | ICD-10-CM | POA: Diagnosis present

## 2015-12-24 DIAGNOSIS — G9340 Encephalopathy, unspecified: Secondary | ICD-10-CM | POA: Diagnosis not present

## 2015-12-24 DIAGNOSIS — J810 Acute pulmonary edema: Secondary | ICD-10-CM | POA: Diagnosis not present

## 2015-12-24 DIAGNOSIS — I959 Hypotension, unspecified: Secondary | ICD-10-CM | POA: Diagnosis not present

## 2015-12-24 DIAGNOSIS — I5032 Chronic diastolic (congestive) heart failure: Secondary | ICD-10-CM | POA: Diagnosis present

## 2015-12-24 DIAGNOSIS — Z515 Encounter for palliative care: Secondary | ICD-10-CM | POA: Diagnosis not present

## 2015-12-24 DIAGNOSIS — Z833 Family history of diabetes mellitus: Secondary | ICD-10-CM | POA: Diagnosis not present

## 2015-12-24 LAB — CBG MONITORING, ED: Glucose-Capillary: 177 mg/dL — ABNORMAL HIGH (ref 65–99)

## 2015-12-24 LAB — URINE MICROSCOPIC-ADD ON

## 2015-12-24 LAB — CBC WITH DIFFERENTIAL/PLATELET
Basophils Absolute: 0 10*3/uL (ref 0.0–0.1)
Basophils Relative: 0 %
EOS PCT: 0 %
Eosinophils Absolute: 0 10*3/uL (ref 0.0–0.7)
HCT: 35 % — ABNORMAL LOW (ref 39.0–52.0)
Hemoglobin: 10 g/dL — ABNORMAL LOW (ref 13.0–17.0)
LYMPHS ABS: 0.7 10*3/uL (ref 0.7–4.0)
LYMPHS PCT: 8 %
MCH: 27.2 pg (ref 26.0–34.0)
MCHC: 28.6 g/dL — AB (ref 30.0–36.0)
MCV: 95.1 fL (ref 78.0–100.0)
MONO ABS: 0.6 10*3/uL (ref 0.1–1.0)
MONOS PCT: 8 %
Neutro Abs: 7 10*3/uL (ref 1.7–7.7)
Neutrophils Relative %: 84 %
PLATELETS: 131 10*3/uL — AB (ref 150–400)
RBC: 3.68 MIL/uL — AB (ref 4.22–5.81)
RDW: 13.7 % (ref 11.5–15.5)
WBC: 8.4 10*3/uL (ref 4.0–10.5)

## 2015-12-24 LAB — TRIGLYCERIDES: TRIGLYCERIDES: 59 mg/dL (ref <150)

## 2015-12-24 LAB — BLOOD GAS, ARTERIAL
ACID-BASE EXCESS: 12.7 mmol/L — AB (ref 0.0–2.0)
BICARBONATE: 36.3 meq/L — AB (ref 20.0–24.0)
Drawn by: 449561
FIO2: 0.6
O2 SAT: 100 %
PEEP: 5 cmH2O
PH ART: 7.557 — AB (ref 7.350–7.450)
Patient temperature: 98.6
RATE: 14 resp/min
TCO2: 37.5 mmol/L (ref 0–100)
VT: 580 mL
pCO2 arterial: 40.8 mmHg (ref 35.0–45.0)
pO2, Arterial: 224 mmHg — ABNORMAL HIGH (ref 80.0–100.0)

## 2015-12-24 LAB — COMPREHENSIVE METABOLIC PANEL
ALBUMIN: 3.9 g/dL (ref 3.5–5.0)
ALT: 19 U/L (ref 17–63)
ANION GAP: 3 — AB (ref 5–15)
AST: 29 U/L (ref 15–41)
Alkaline Phosphatase: 80 U/L (ref 38–126)
BUN: 26 mg/dL — ABNORMAL HIGH (ref 6–20)
CO2: 44 mmol/L — AB (ref 22–32)
Calcium: 9.4 mg/dL (ref 8.9–10.3)
Chloride: 93 mmol/L — ABNORMAL LOW (ref 101–111)
Creatinine, Ser: 1.32 mg/dL — ABNORMAL HIGH (ref 0.61–1.24)
GFR calc non Af Amer: 51 mL/min — ABNORMAL LOW (ref >60)
GFR, EST AFRICAN AMERICAN: 60 mL/min — AB (ref >60)
GLUCOSE: 191 mg/dL — AB (ref 65–99)
POTASSIUM: 5.1 mmol/L (ref 3.5–5.1)
SODIUM: 140 mmol/L (ref 135–145)
Total Bilirubin: 0.9 mg/dL (ref 0.3–1.2)
Total Protein: 7.4 g/dL (ref 6.5–8.1)

## 2015-12-24 LAB — PROTIME-INR
INR: 4.8 — ABNORMAL HIGH (ref 0.00–1.49)
Prothrombin Time: 43.6 seconds — ABNORMAL HIGH (ref 11.6–15.2)

## 2015-12-24 LAB — GLUCOSE, CAPILLARY
GLUCOSE-CAPILLARY: 149 mg/dL — AB (ref 65–99)
GLUCOSE-CAPILLARY: 44 mg/dL — AB (ref 65–99)
Glucose-Capillary: 124 mg/dL — ABNORMAL HIGH (ref 65–99)
Glucose-Capillary: 82 mg/dL (ref 65–99)
Glucose-Capillary: 86 mg/dL (ref 65–99)
Glucose-Capillary: 87 mg/dL (ref 65–99)
Glucose-Capillary: 90 mg/dL (ref 65–99)

## 2015-12-24 LAB — RENAL FUNCTION PANEL
Albumin: 2.2 g/dL — ABNORMAL LOW (ref 3.5–5.0)
Anion gap: 8 (ref 5–15)
BUN: 21 mg/dL — AB (ref 6–20)
CHLORIDE: 104 mmol/L (ref 101–111)
CO2: 29 mmol/L (ref 22–32)
Calcium: 7.4 mg/dL — ABNORMAL LOW (ref 8.9–10.3)
Creatinine, Ser: 0.99 mg/dL (ref 0.61–1.24)
GFR calc Af Amer: >60 mL/min (ref >60)
GFR calc non Af Amer: >60 mL/min (ref >60)
GLUCOSE: 67 mg/dL (ref 65–99)
POTASSIUM: 3.6 mmol/L (ref 3.5–5.1)
Phosphorus: <1 mg/dL — CL (ref 2.5–4.6)
Sodium: 141 mmol/L (ref 135–145)

## 2015-12-24 LAB — TROPONIN I
Troponin I: 0.08 ng/mL — ABNORMAL HIGH (ref <0.031)
Troponin I: 0.14 ng/mL — ABNORMAL HIGH (ref <0.031)

## 2015-12-24 LAB — URINALYSIS, ROUTINE W REFLEX MICROSCOPIC
BILIRUBIN URINE: NEGATIVE
Glucose, UA: 100 mg/dL — AB
Ketones, ur: 15 mg/dL — AB
Leukocytes, UA: NEGATIVE
NITRITE: NEGATIVE
PH: 6.5 (ref 5.0–8.0)
Protein, ur: 30 mg/dL — AB
SPECIFIC GRAVITY, URINE: 1.017 (ref 1.005–1.030)

## 2015-12-24 LAB — PROCALCITONIN

## 2015-12-24 LAB — APTT: aPTT: 45 seconds — ABNORMAL HIGH (ref 24–37)

## 2015-12-24 LAB — MRSA PCR SCREENING: MRSA by PCR: NEGATIVE

## 2015-12-24 LAB — MAGNESIUM: MAGNESIUM: 1.7 mg/dL (ref 1.7–2.4)

## 2015-12-24 LAB — BRAIN NATRIURETIC PEPTIDE: B Natriuretic Peptide: 467.4 pg/mL — ABNORMAL HIGH (ref 0.0–100.0)

## 2015-12-24 MED ORDER — DEXTROSE 50 % IV SOLN
50.0000 mL | Freq: Once | INTRAVENOUS | Status: AC
  Administered 2015-12-24: 50 mL via INTRAVENOUS

## 2015-12-24 MED ORDER — METOPROLOL TARTRATE 5 MG/5ML IV SOLN
5.0000 mg | Freq: Four times a day (QID) | INTRAVENOUS | Status: DC
  Administered 2015-12-24 – 2015-12-27 (×8): 5 mg via INTRAVENOUS
  Filled 2015-12-24 (×16): qty 5

## 2015-12-24 MED ORDER — ANTISEPTIC ORAL RINSE SOLUTION (CORINZ)
7.0000 mL | Freq: Four times a day (QID) | OROMUCOSAL | Status: DC
  Administered 2015-12-24 – 2015-12-27 (×14): 7 mL via OROMUCOSAL

## 2015-12-24 MED ORDER — ROCURONIUM BROMIDE 50 MG/5ML IV SOLN
1.0000 mg/kg | Freq: Once | INTRAVENOUS | Status: DC
  Administered 2015-12-23: 80 mg via INTRAVENOUS

## 2015-12-24 MED ORDER — SODIUM CHLORIDE 0.9 % IV SOLN: 250.0000 mL | INTRAVENOUS | Status: DC | PRN

## 2015-12-24 MED ORDER — HEPARIN SODIUM (PORCINE) 5000 UNIT/ML IJ SOLN
5000.0000 [IU] | Freq: Three times a day (TID) | INTRAMUSCULAR | Status: DC
  Administered 2015-12-24 – 2015-12-28 (×12): 5000 [IU] via SUBCUTANEOUS
  Filled 2015-12-24 (×12): qty 1

## 2015-12-24 MED ORDER — AMIODARONE HCL IN DEXTROSE 360-4.14 MG/200ML-% IV SOLN
60.0000 mg/h | INTRAVENOUS | Status: DC
Start: 2015-12-24 — End: 2015-12-25
  Administered 2015-12-25: 60 mg/h via INTRAVENOUS
  Filled 2015-12-24: qty 200

## 2015-12-24 MED ORDER — AMIODARONE HCL IN DEXTROSE 360-4.14 MG/200ML-% IV SOLN
INTRAVENOUS | Status: AC
  Filled 2015-12-24: qty 200

## 2015-12-24 MED ORDER — AMIODARONE HCL IN DEXTROSE 360-4.14 MG/200ML-% IV SOLN
30.0000 mg/h | INTRAVENOUS | Status: DC
  Administered 2015-12-25 (×2): 30 mg/h via INTRAVENOUS
  Filled 2015-12-24 (×5): qty 200

## 2015-12-24 MED ORDER — DILTIAZEM LOAD VIA INFUSION
10.0000 mg | Freq: Once | INTRAVENOUS | Status: AC
  Administered 2015-12-24: 10 mg via INTRAVENOUS
  Filled 2015-12-24: qty 10

## 2015-12-24 MED ORDER — AMIODARONE LOAD VIA INFUSION
150.0000 mg | Freq: Once | INTRAVENOUS | Status: AC
  Administered 2015-12-24: 150 mg via INTRAVENOUS

## 2015-12-24 MED ORDER — FAMOTIDINE IN NACL 20-0.9 MG/50ML-% IV SOLN
20.0000 mg | INTRAVENOUS | Status: DC
  Administered 2015-12-24 – 2015-12-25 (×2): 20 mg via INTRAVENOUS
  Filled 2015-12-24 (×2): qty 50

## 2015-12-24 MED ORDER — VANCOMYCIN HCL 10 G IV SOLR
1250.0000 mg | Freq: Two times a day (BID) | INTRAVENOUS | Status: DC
  Administered 2015-12-24 – 2015-12-26 (×5): 1250 mg via INTRAVENOUS
  Filled 2015-12-24 (×6): qty 1250

## 2015-12-24 MED ORDER — POTASSIUM PHOSPHATES 15 MMOLE/5ML IV SOLN
30.0000 mmol | Freq: Once | INTRAVENOUS | Status: AC
  Administered 2015-12-24: 30 mmol via INTRAVENOUS
  Filled 2015-12-24: qty 10

## 2015-12-24 MED ORDER — DEXTROSE 50 % IV SOLN
INTRAVENOUS | Status: AC
  Filled 2015-12-24: qty 50

## 2015-12-24 MED ORDER — DEXTROSE 5 % IV SOLN
1.0000 g | Freq: Three times a day (TID) | INTRAVENOUS | Status: DC
  Administered 2015-12-24 – 2015-12-26 (×6): 1 g via INTRAVENOUS
  Filled 2015-12-24 (×10): qty 1

## 2015-12-24 MED ORDER — VANCOMYCIN HCL IN DEXTROSE 750-5 MG/150ML-% IV SOLN
750.0000 mg | Freq: Two times a day (BID) | INTRAVENOUS | Status: DC
  Filled 2015-12-24: qty 150

## 2015-12-24 MED ORDER — CHLORHEXIDINE GLUCONATE 0.12% ORAL RINSE (MEDLINE KIT)
15.0000 mL | Freq: Two times a day (BID) | OROMUCOSAL | Status: DC
  Administered 2015-12-24 – 2015-12-27 (×8): 15 mL via OROMUCOSAL

## 2015-12-24 MED ORDER — INSULIN ASPART 100 UNIT/ML ~~LOC~~ SOLN
0.0000 [IU] | SUBCUTANEOUS | Status: DC
  Administered 2015-12-24: 1 [IU] via SUBCUTANEOUS
  Administered 2015-12-24: 2 [IU] via SUBCUTANEOUS
  Administered 2015-12-25: 1 [IU] via SUBCUTANEOUS
  Filled 2015-12-24: qty 1

## 2015-12-24 MED ORDER — METOPROLOL TARTRATE 5 MG/5ML IV SOLN
2.5000 mg | INTRAVENOUS | Status: DC | PRN
  Administered 2015-12-24: 5 mg via INTRAVENOUS
  Filled 2015-12-24: qty 5

## 2015-12-24 MED ORDER — FENTANYL CITRATE (PF) 100 MCG/2ML IJ SOLN
25.0000 ug | INTRAMUSCULAR | Status: DC | PRN
  Administered 2015-12-24 (×4): 50 ug via INTRAVENOUS
  Administered 2015-12-24: 25 ug via INTRAVENOUS
  Administered 2015-12-24 – 2015-12-26 (×11): 50 ug via INTRAVENOUS
  Filled 2015-12-24 (×16): qty 2

## 2015-12-24 MED ORDER — ETOMIDATE 2 MG/ML IV SOLN
20.0000 mg | Freq: Once | INTRAVENOUS | Status: DC
  Administered 2015-12-23: 20 mg via INTRAVENOUS

## 2015-12-24 MED ORDER — DILTIAZEM HCL 100 MG IV SOLR
5.0000 mg/h | INTRAVENOUS | Status: DC
  Administered 2015-12-24: 5 mg/h via INTRAVENOUS
  Filled 2015-12-24: qty 100

## 2015-12-24 NOTE — Care Management Note (Signed)
Case Management Note  Patient Details  Name: Christian [name] MRN: [id number] Date of Birth: [birth year]  Subjective/Objective:     Pt admitted with acute on chronic resp failure               Action/Plan:  PTA pt home with family.  Family will have palliative care meeting today.  CM will continue to follow for discharge needs   Expected Discharge Date:                  Expected Discharge Plan:  [place]  In-House Referral:     Discharge planning Services  CM Consult  Post Acute Care Choice:    Choice offered to:     DME Arranged:    DME Agency:     HH Arranged:    HH Agency:     Status of Service:  In process, will continue to follow  Medicare Important Message Given:    Date Medicare IM Given:    Medicare IM give by:    Date Additional Medicare IM Given:    Additional Medicare Important Message give by:     If discussed at [place] of Stay Meetings, dates discussed:    Additional Comments:  Maryclare Labrador, RN 12/24/2015, 3:06 PM

## 2015-12-24 NOTE — Progress Notes (Signed)
[facility] Progress Note Patient Name: Christian [name] DOB: [birth year] MRN: [id number]   Date of Service  12/24/2015  HPI/Events of Note  Afib with RVR despite max dose dilt gtt and bolus  eICU Interventions  amidarone orderset for infusion     Intervention Category Major Interventions: Arrhythmia - evaluation and management  Simonne Maffucci 12/24/2015, 9:19 PM

## 2015-12-24 NOTE — Progress Notes (Signed)
Hypoglycemic Event  CBG: 44  Treatment: D50 IV 25 mL  Symptoms: None  Follow-up CBG: Time: 0817 CBG Result: 124  Possible Reasons for Event: Unknown  Comments/MD notified: MD on floor and aware    Navia Lindahl, Garnette Scheuermann

## 2015-12-24 NOTE — Progress Notes (Signed)
Pharmacy Antibiotic Note  Christian [name] is a 75 y.o. male admitted on 12/21/2015 with HCAP.  Pharmacy has been consulted for Vancomycin and Cefepime dosing.  Improving renal function, CrCl ~70 ml/min  Plan: Continue cefepime 1gm IV q8h Increase vancomycin maintenance dose to  1250mg  IV q12h Will f/u micro data, renal function, and pt's clinical condition Vanc trough prn  Height: 5\' 10"  (177.8 cm) Weight: 201 lb 15.1 oz (91.6 kg) IBW/kg (Calculated) : 73  Temp (24hrs), Avg:96.9 F (36.1 C), Min:92.8 F (33.8 C), Max:99.3 F (37.4 C)   Recent Labs Lab 12/13/2015 2230 12/30/2015 2251 12/24/15 0527  WBC 7.4  --  8.4  CREATININE 1.32*  --  0.99  LATICACIDVEN  --  0.81  --     Estimated Creatinine Clearance: 74.4 mL/min (by C-G formula based on Cr of 0.99).    Allergies  Allergen Reactions  . Ambien [Zolpidem Tartrate]     Over sedation  . Crestor [Rosuvastatin Calcium]     myalgias  . Other     Sedative or medications for sleep.  . Pradaxa [Dabigatran Etexilate Mesylate]     Inflammation in throat.  . Tramadol     Intolerant but not an allergy; sedation    Antimicrobials this admission: 6/14 Cefepime >>  6/15 Vanc >>   Dose adjustments this admission: vanc 750 mg BID to 1250 mg BID on 6/14  Microbiology results: 6/14 BCx x2: sent 6/14 UCx: sent 6/14 TA aspirate: 6/14 MRSA screen neg     Thank you for allowing pharmacy to be a part of this patient's care.  Heloise Ochoa, [place].D., BCPS PGY2 Pharmacy Resident Pager: [phone]  12/24/2015 10:13 AM

## 2015-12-24 NOTE — ED Provider Notes (Signed)
CSN: [id number]     Arrival date & time 12/30/2015  2226 History   First MD Initiated Contact with Patient 12/20/2015 2224     No chief complaint on file.    Level 5 caveat due to altered mental status and urgent need for intervention. HPI  Patient was brought in by EMS for altered mental status. Reportedly was not holding his fork as well. Found to be hypoxic. Initially for me unresponsive. No response to a tongue depressor in his posterior pharynx and decision was made to intubate. Moderate edema to bilateral lower extremities. No response to Narcan by EMS. Past Medical History  Diagnosis Date  . Coronary artery disease     a. 2009 s/p CABG x 3; 09/2007 Cath: patent grafts.  . Type II diabetes mellitus ([place])   . Hyperlipidemia   . Hypertensive heart disease   . GERD (gastroesophageal reflux disease)   . Arthritis   . Diastolic CHF, chronic ([place])     a. 04/2012 Echo: EF >55%, no rwma, mod conc LVH, mod dil LA, Triv MR/TR.  Marland Kitchen Nephrolithiasis   . Chronic atrial fibrillation ([place])     a. DCCV 04/2010-->reverted to AF by 2012;  b. CHA2DS2VASc = 4->Xarelto.  . CO2 retention     during admission to [facility] 2013  . CKD (chronic kidney disease), stage III   . Hepatic encephalopathy ([place])     2013  . 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 Coronary artery bypass graft  2009    x 3 @ [facility]  . Cardiac catheterization  2009    showing patent grafts with elevated pulmonary pressures  . Colonoscopy  2011    negative per report  . Iron transfusion     Family History  Problem Relation Age of Onset  . Heart disease Brother   . Arthritis Mother   . Hypertension Mother   . Aneurysm Mother   . Heart disease Father   . Hyperlipidemia Father   . Stroke Father   . Diabetes Father   . Heart failure Father   . Heart disease Sister   . Colon cancer Neg Hx   . Prostate cancer Neg Hx   . Heart disease Brother     Social History  Substance Use Topics  . Smoking status: Former Smoker -- 1.50 packs/day for 30 years    Types: Cigarettes    Quit date: 07/11/1981  . Smokeless tobacco: Never Used  . Alcohol Use: No    Review of Systems  Unable to perform ROS: Mental status change      Allergies  Ambien; Crestor; Other; Pradaxa; and Tramadol  Home Medications   Prior to Admission medications   Medication Sig Start Date End Date Taking? Authorizing Provider  acetaminophen (TYLENOL) 500 MG tablet Take 500 mg by mouth every 6 (six) hours as needed.    Historical Provider, MD  Cyanocobalamin (VITAMIN B-12) 6000 MCG SUBL Place under the tongue.    Historical Provider, MD  glucosamine-chondroitin 500-400 MG tablet Take 1 tablet by mouth daily.    Historical Provider, MD  ipratropium (ATROVENT) 0.03 % nasal spray Place 2 sprays into both nostrils 3 (three) times daily.    Historical Provider, MD  menthol-cetylpyridinium (CEPACOL) 3 MG lozenge Take 1 lozenge by mouth as needed for sore throat.    Historical Provider, MD  metoprolol tartrate (LOPRESSOR) 25 MG tablet Take 12.5 mg by mouth 2 (  two) times daily.     Historical Provider, MD  MULTAQ 400 MG tablet TAKE 1 TABLET(400 MG) BY MOUTH TWICE DAILY WITH A MEAL 11/16/15   Minna Merritts, MD  Multiple Vitamins-Minerals (CENTRUM SILVER PO) Take 1 tablet by mouth daily.     Historical Provider, MD  nitroGLYCERIN (NITROSTAT) 0.4 MG SL tablet Place 0.4 mg under the tongue every 5 (five) minutes as needed.      Historical Provider, MD  NON FORMULARY Oxygen @ 2 liters at bedtime.    Historical Provider, MD  omeprazole (PRILOSEC) 20 MG capsule Take 20 mg by mouth daily.    Historical Provider, MD  potassium chloride (K-DUR,KLOR-CON) 10 MEQ tablet Take 10 mEq by mouth every other day.    Historical Provider, MD  rivaroxaban (XARELTO) 20 MG TABS tablet Take 1 tablet (20 mg total) by mouth daily. 08/10/15   Minna Merritts, MD  spironolactone (ALDACTONE) 25 MG  tablet Take 25 mg by mouth every other day.  01/31/13   Tonia Ghent, MD  tamsulosin (FLOMAX) 0.4 MG CAPS capsule Take 0.4 mg by mouth daily after supper.    Historical Provider, MD  torsemide (DEMADEX) 20 MG tablet Take 20 mg by mouth every other day.    Historical Provider, MD  triamcinolone cream (KENALOG) 0.1 % Apply 1 application topically 2 (two) times daily as needed.    Historical Provider, MD   BP 106/55 mmHg  Pulse 82  Resp 17  Wt 200 lb (90.719 kg)  SpO2 100% Physical Exam  Constitutional: He appears well-developed.  HENT:  Head: Atraumatic.  Eyes:  Pupils somewhat constricted  Neck: Neck supple.  Cardiovascular: Normal rate.   Pulmonary/Chest:  Decreased breath sounds throughout.  Abdominal: There is no tenderness.  Musculoskeletal: He exhibits edema.  Moderate to severe edema bilateral lower extremities.  Neurological:  Unresponsive to pain. Nonverbal. No response to tongue depressor in posterior pharynx.  Skin:  Legs are cool.    ED Course  Procedures (including critical care time) Labs Review Labs Reviewed  CBC WITH DIFFERENTIAL/PLATELET - Abnormal; Notable for the following:    Hemoglobin 12.0 (*)    MCHC 27.5 (*)    Lymphs Abs 0.5 (*)    All other components within normal limits  BRAIN NATRIURETIC PEPTIDE - Abnormal; Notable for the following:    B Natriuretic Peptide 467.4 (*)    All other components within normal limits  I-STAT ARTERIAL BLOOD GAS, ED - Abnormal; Notable for the following:    pH, Arterial 7.520 (*)    pCO2 arterial 48.8 (*)    pO2, Arterial 510.0 (*)    Bicarbonate 41.1 (*)    Acid-Base Excess 15.0 (*)    All other components within normal limits  CULTURE, BLOOD (ROUTINE X 2)  CULTURE, BLOOD (ROUTINE X 2)  URINE CULTURE  COMPREHENSIVE METABOLIC PANEL  URINALYSIS, ROUTINE W REFLEX MICROSCOPIC (NOT AT [facility])  BLOOD GAS, ARTERIAL  TRIGLYCERIDES  I-STAT TROPOININ, ED  I-STAT CG4 LACTIC ACID, ED  I-STAT CHEM 8, ED    Imaging  Review Dg Chest Portable 1 View  12/28/2015  CLINICAL DATA:  Endotracheal tube placement.  Initial encounter. EXAM: PORTABLE CHEST 1 VIEW COMPARISON:  Chest radiograph performed 10/27/2015 FINDINGS: The patient's endotracheal tube is seen ending 2 cm above the carina. The lungs are mildly hypoexpanded. Right midlung and left basilar airspace opacities raise concern for pneumonia. There is no evidence of pleural effusion or pneumothorax. The cardiomediastinal silhouette is mildly enlarged. The patient  is status post median sternotomy, with evidence of prior CABG. No acute osseous abnormalities are seen. IMPRESSION: 1. Endotracheal tube seen ending 2 cm above the carina. 2. Lungs mildly hypoexpanded. Right midlung and left basilar airspace opacities raise concern for pneumonia. 3. Mild cardiomegaly. Electronically Signed   By: Garald Balding M.D.   On: 12/21/2015 22:58   I have personally reviewed and evaluated these images and lab results as part of my medical decision-making.   EKG Interpretation   Date/Time:  Wednesday December 23 2015 22:33:53 EDT Ventricular Rate:  73 PR Interval:  184 QRS Duration: 102 QT Interval:  432 QTC Calculation: 475 R Axis:   49 Text Interpretation:  Sinus rhythm with occasional Premature ventricular  complexes and Premature atrial complexes Otherwise normal ECG Confirmed by  Chalene Treu  MD, Ovid Curd [phone]) on 12/10/2015 10:49:14 PM      MDM   Final diagnoses:  Acute on chronic respiratory failure with hypoxia and hypercapnia (HCC)  HCAP (healthcare-associated pneumonia)    Patient presented in respiratory failure with decreased mental status. Intubated by resident. Her had discussions with the patient's family. He would not want extraordinary measures and has had   difficult to coming off the vent in the past. he is very sensitive to opiates. X-ray shows possible pneumonia. He was admitted around 2 months ago so this would be age. Normal lactic acid but is  hypothermic. Admit to ICU. With normal lactic acid and lack of hypotension 30/kg fluid bolus was not given. Seen by Dr. Milinda Hirschfeld in the ER.  CRITICAL CARE Performed by: Mackie Pai Total critical care time: 30 minutes Critical care time was exclusive of separately billable procedures and treating other patients. Critical care was necessary to treat or prevent imminent or life-threatening deterioration. Critical care was time spent personally by me on the following activities: development of treatment plan with patient and/or surrogate as well as nursing, discussions with consultants, evaluation of patient's response to treatment, examination of patient, obtaining history from patient or surrogate, ordering and performing treatments and interventions, ordering and review of laboratory studies, ordering and review of radiographic studies, pulse oximetry and re-evaluation of patient's condition.     Davonna Belling, MD 12/24/15 [phone]

## 2015-12-24 NOTE — ED Notes (Signed)
Per family pt has to sleep on his side and sit up some

## 2015-12-24 NOTE — Progress Notes (Signed)
CRITICAL VALUE ALERT  Critical value received:  Phosphorus <1  Date of notification:  12/24/15  Time of notification:  0617  Critical value read back:Yes.    Nurse who received alert:  Donald Siva RN  MD notified (1st page):  Dr. Ashby Dawes  Time of first page:  0617  MD notified (2nd page):  Time of second page:  Responding MD:  Dr. Ashby Dawes  Time MD responded:  [phone]

## 2015-12-24 NOTE — Progress Notes (Signed)
[place] Progress Note Patient Name: Christian [name] DOB: [birth year] MRN: [id number]   Date of Service  12/24/2015  HPI/Events of Note  afib with rvr despite metoprolol MAP 73  eICU Interventions  dilt gtt     Intervention Category Major Interventions: Arrhythmia - evaluation and management  Simonne Maffucci 12/24/2015, 5:33 PM

## 2015-12-24 NOTE — Progress Notes (Signed)
Richardson Landry Minor, NP on floor and made aware pts HR maintaining 130's Afib despite PRN dose of 5mg  Lopressor.

## 2015-12-24 NOTE — ED Provider Notes (Signed)
  Procedure note: INTUBATION  HPI - See Dr. Neomia Dear note.   Review of Systems - See Dr. Neomia Dear note.   Physical Exam - See Dr. Neomia Dear note.   ED Course  .Intubation Date/Time: 12/24/2015 2:23 AM Performed by: Marigene Ehlers Avyan Livesay Authorized by: Davonna Belling Consent: The procedure was performed in an emergent situation. Time out: Immediately prior to procedure a "time out" was called to verify the correct patient, procedure, equipment, support staff and site/side marked as required. Indications: airway protection Intubation method: video-assisted Patient status: paralyzed (RSI) Preoxygenation: nonrebreather mask Sedatives: etomidate Paralytic: rocuronium Laryngoscope size: Glidescope size 3. Tube size: 7.5 mm Tube type: cuffed Number of attempts: 1 Cords visualized: yes Post-procedure assessment: chest rise and CO2 detector Breath sounds: equal Cuff inflated: yes ETT to lip: 24 cm Tube secured with: ETT holder Chest x-ray interpreted by other physician. Chest x-ray findings: endotracheal tube in appropriate position Patient tolerance: Patient tolerated the procedure well with no immediate complications      Berenice Primas, MD 12/24/15 0225  Davonna Belling, MD 12/25/15 2139

## 2015-12-24 NOTE — ED Notes (Signed)
Verbal orders given per Resident for 20 mg Etomidate and 80 mg of Rocuronium for emergent intubation.

## 2015-12-24 NOTE — Progress Notes (Signed)
   12/24/15 1100  Clinical Encounter Type  Visited With Patient;Patient and family together  Visit Type Follow-up;Spiritual support;Social support  Referral From Chaplain  Spiritual Encounters  Spiritual Needs Emotional;Grief support  Stress Factors  Patient Stress Factors Health changes;Loss of control;Major life changes  Family Stress Factors Health changes;Loss of control;Major life changes   Chaplain visited with pt. Pt not able to respond as he had tube in his mouth. Pt able to move his eyes and responded when asked if he needed anything. Chaplain was able to visit with daughter of pt.  Pt has a loving, supportive family.  Chaplain provided ministry of care and spiritual support to pt and family.  Chaplain will follow up with pt this afternoon.  Vilinda Blanks Lum Stillinger 12/24/2015 11:15 AM

## 2015-12-24 NOTE — Progress Notes (Signed)
  Amiodarone Drug - Drug Interaction Consult Note  Recommendations: Monitor for bradycardia with concurrent use of metoprolol  Amiodarone is metabolized by the cytochrome P450 system and therefore has the potential to cause many drug interactions. Amiodarone has an average plasma half-life of 50 days (range 20 to 100 days).   There is potential for drug interactions to occur several weeks or months after stopping treatment and the onset of drug interactions may be slow after initiating amiodarone.   []  Statins: Increased risk of myopathy. Simvastatin- restrict dose to 20mg  daily. Other statins: counsel patients to report any muscle pain or weakness immediately.  []  Anticoagulants: Amiodarone can increase anticoagulant effect. Consider warfarin dose reduction. Patients should be monitored closely and the dose of anticoagulant altered accordingly, remembering that amiodarone levels take several weeks to stabilize.  []  Antiepileptics: Amiodarone can increase plasma concentration of phenytoin, the dose should be reduced. Note that small changes in phenytoin dose can result in large changes in levels. Monitor patient and counsel on signs of toxicity.  [x]  Beta blockers: increased risk of bradycardia, AV block and myocardial depression. Sotalol - avoid concomitant use.  []   Calcium channel blockers (diltiazem and verapamil): increased risk of bradycardia, AV block and myocardial depression.  []   Cyclosporine: Amiodarone increases levels of cyclosporine. Reduced dose of cyclosporine is recommended.  []  Digoxin dose should be halved when amiodarone is started.  []  Diuretics: increased risk of cardiotoxicity if hypokalemia occurs.  []  Oral hypoglycemic agents (glyburide, glipizide, glimepiride): increased risk of hypoglycemia. Patient's glucose levels should be monitored closely when initiating amiodarone therapy.   []  Drugs that prolong the QT interval:  Torsades de pointes risk may be increased  with concurrent use - avoid if possible.  Monitor QTc, also keep magnesium/potassium WNL if concurrent therapy can't be avoided. Marland Kitchen Antibiotics: e.g. fluoroquinolones, erythromycin. . Antiarrhythmics: e.g. quinidine, procainamide, disopyramide, sotalol. . Antipsychotics: e.g. phenothiazines, haloperidol.  . Lithium, tricyclic antidepressants, and methadone.  Thank You,  Christian [name]  12/24/2015 9:26 PM

## 2015-12-24 NOTE — Progress Notes (Signed)
PULMONARY / CRITICAL CARE MEDICINE   Name: Christian [name] MRN: [id number] DOB: 08-08-1940    ADMISSION DATE:  01/03/2016 CONSULTATION DATE:  12/26/2015  REFERRING MD:  EDP  CHIEF COMPLAINT:  Acute Hypoxic Respiratory Failure  HISTORY OF PRESENT ILLNESS:  Per the electronic medical record patient became increasingly lethargic and "wasn't acting like himself". Patient was unable to hold a fork dinner. Family called EMS. On arrival patient was saturating 84% on room air and drooling. Patient was unresponsive to sternal rub.. Patient was subsequently intubated in the emergency department after arrival and assessment by emergency department physician. Patient's family reports that he was hospitalized in February for aspiration pneumonia. Over the last month they have noticed a gradual decline with increasing fatigue and dyspnea on exertion. Patient had been compliant with his diuretic medications but family do report increasing edema that they know today. The patient has also had his Lopressor adjusted recently as well for persistent bradycardia. At approximately 5 PM patient was noted to have decreased level of consciousness and incontinence but was able to respond. Family reports that after contacting him later in the evening his altered mentation became evident with repeated dropping of the phone. Patient's family reports that they had planned to discuss palliative medicine at appointment with his general practitioner tomorrow.   SUBJECTIVE: NAD  VITAL SIGNS: BP 81/58 mmHg  Pulse 112  Temp(Src) 99.3 F (37.4 C) (Oral)  Resp 17  Ht 5\' 10"  (1.778 m)  Wt 201 lb 15.1 oz (91.6 kg)  BMI 28.98 kg/m2  SpO2 100%  HEMODYNAMICS:    VENTILATOR SETTINGS: Vent Mode:  [-] PRVC FiO2 (%):  [40 %-100 %] 40 % Set Rate:  [14 bmp] 14 bmp Vt Set:  [580 mL-620 mL] 580 mL PEEP:  [5 cmH20] 5 cmH20 Plateau Pressure:  [30 [id number] cmH20] 32 cmH20  INTAKE / OUTPUT: I/O last 3 completed shifts: In: 1693.6  [I.V.:1693.6] Out: 255 [Urine:255]  PHYSICAL EXAMINATION: General:  Sedated. No acute distress. Integument:  Warm & dry. No rash on exposed skin. Some dermal exfoliation on his feet bilaterally. HEENT:  No scleral injection or icterus. Endotracheal tube in place.  Cardiovascular: HR 80 Pitting lower extremity edema.  Pulmonary:  Diminished and coarse breath sounds bilaterally. Symmetric chest wall rise on ventilator. Abdomen: Soft. Normal bowel sounds. Nondistended. Musculoskeletal:  Normal bulk. No joint deformity or effusion appreciated. Neurological:  Patient overbreathing the vent. Pupils reactive. Withdraws extremities to pain. Tracks and follows Psychiatric:  Unable to assess given intubation and sedation.  LABS:  BMET  Recent Labs Lab 01/03/2016 2230 12/24/15 0527  NA 140 141  K 5.1 3.6  CL 93* 104  CO2 44* 29  BUN 26* 21*  CREATININE 1.32* 0.99  GLUCOSE 191* 67    Electrolytes  Recent Labs Lab 12/27/2015 2230 12/24/15 0527  CALCIUM 9.4 7.4*  MG  --  1.7  PHOS  --  <1.0*    CBC  Recent Labs Lab 12/20/2015 2230 12/24/15 0527  WBC 7.4 8.4  HGB 12.0* 10.0*  HCT 43.6 35.0*  PLT 184 131*    Coag's  Recent Labs Lab 12/24/15 0527  APTT 45*  INR 4.80*    Sepsis Markers  Recent Labs Lab 01/02/2016 2251 12/24/15 0105  LATICACIDVEN 0.81  --   PROCALCITON  --  <0.10    ABG  Recent Labs Lab 12/30/2015 2338 12/24/15 0415  PHART 7.520* 7.557*  PCO2ART 48.8* 40.8  PO2ART 510.0* 224*    Liver Enzymes  Recent  Labs Lab 01/06/2016 2230 12/24/15 0527  AST 29  --   ALT 19  --   ALKPHOS 80  --   BILITOT 0.9  --   ALBUMIN 3.9 2.2*    Cardiac Enzymes  Recent Labs Lab 12/24/15 0527  TROPONINI 0.08*    Glucose  Recent Labs Lab 12/24/15 0127 12/24/15 0345 12/24/15 0737 12/24/15 0817  GLUCAP 177* 149* 44* 124*    Imaging Ct Head Wo Contrast  12/24/2015  CLINICAL DATA:  Acute onset of altered mental status. Severe lethargy. Initial  encounter. EXAM: CT HEAD WITHOUT CONTRAST TECHNIQUE: Contiguous axial images were obtained from the base of the skull through the vertex without intravenous contrast. COMPARISON:  CT of the head performed 09/02/2011, and MRI of the brain performed 09/06/2011 FINDINGS: There is no evidence of acute infarction, mass lesion, or intra- or extra-axial hemorrhage on CT. Prominence of the ventricles and sulci reflects mild cortical volume loss. Mild cerebellar atrophy is noted. Scattered periventricular and subcortical white matter change likely reflects small vessel ischemic microangiopathy. The brainstem and fourth ventricle are within normal limits. The basal ganglia are unremarkable in appearance. The cerebral hemispheres demonstrate grossly normal gray-white differentiation. No mass effect or midline shift is seen. There is no evidence of fracture; visualized osseous structures are unremarkable in appearance. The orbits are within normal limits. The paranasal sinuses and mastoid air cells are well-aerated. No significant soft tissue abnormalities are seen. IMPRESSION: 1. No acute intracranial pathology seen on CT. 2. Mild cortical volume loss and scattered small vessel ischemic microangiopathy. Electronically Signed   By: Garald Balding M.D.   On: 12/24/2015 01:01   Dg Chest Portable 1 View  12/11/2015  CLINICAL DATA:  Endotracheal tube placement.  Initial encounter. EXAM: PORTABLE CHEST 1 VIEW COMPARISON:  Chest radiograph performed 10/27/2015 FINDINGS: The patient's endotracheal tube is seen ending 2 cm above the carina. The lungs are mildly hypoexpanded. Right midlung and left basilar airspace opacities raise concern for pneumonia. There is no evidence of pleural effusion or pneumothorax. The cardiomediastinal silhouette is mildly enlarged. The patient is status post median sternotomy, with evidence of prior CABG. No acute osseous abnormalities are seen. IMPRESSION: 1. Endotracheal tube seen ending 2 cm above  the carina. 2. Lungs mildly hypoexpanded. Right midlung and left basilar airspace opacities raise concern for pneumonia. 3. Mild cardiomegaly. Electronically Signed   By: Garald Balding M.D.   On: 12/20/2015 22:58   Dg Abd Portable 1v  12/24/2015  CLINICAL DATA:  Orogastric tube placement. EXAM: PORTABLE ABDOMEN - 1 VIEW COMPARISON:  10/03/2012. FINDINGS: Orogastric tube tip noted projected over the stomach. Prior median sternotomy. Surgical clips upper abdomen. No bowel distention. Several loops of nonspecific air-filled small bowel cannot be excluded. No free air. Degenerative changes thoracic spine. Aortoiliac atherosclerotic vascular calcification. IMPRESSION: 1. NG tube noted with its tip projected over the stomach. No bowel distention. Several loops of nondilated air-filled small bowel cannot be excluded. No evidence of bowel obstruction. No free air. 2. Aortoiliac atherosclerotic vascular disease. Electronically Signed   By: Marcello Moores  Register   On: 12/24/2015 07:16     STUDIES:  PORT CXR 6/14:  Endotracheal tube in acceptable position. Low lung volumes. Blunting of left costophrenic angle & questionable right midlung opacity. CT HEAD W/O 6/15 >>neg TTE 6/15 >>  MICROBIOLOGY: Blood Ctx x2 6/14 >> Urine Ctx 6/14 >> MRSA PCR 6/14 >> Tracheal Asp Ctx 6/14 >>  ANTIBIOTICS: Cefepime 6/14 >> Vancomycin 6/14 >>  SIGNIFICANT EVENTS: 6/14 -  Admit  LINES/TUBES: OETT 7.5 6/14 >> Foley 6/14 >> PIV x3  DISCUSSION:  75 year old male with acute on chronic hypoxic and hypercarbic respiratory failure. Unclear etiology as patient's BNP does not appear above baseline. CT head pending for possible intracranial hemorrhage given chronic Xarelto with chronic renal failure. Continuing empiric cataracts with low threshold to de-escalate depending upon Procalcitonin and culture data. Lengthy discussion with the patient's family at bedside regarding goals of care. Plan to consult palliative medicine in the  morning and patient will remain intubated but be DO NOT RESUSCITATE otherwise.  ASSESSMENT / PLAN:  PULMONARY A: Acute on Chronic Hypoxic Respiratory Failure - Baseline 2 L/m requirement. Chronic Hypercarbic Respiratory Failure - Previously followed with [facility]. On nocturnal NIPPV. Possible Aspiration Pneumonia  P:   Full Vent Support SBT & WUA  As tolerated  CARDIOVASCULAR A:  H/O Chronic Diastolic CHF H/O Hyperlipidemia H/O HTN H/O Chronic A fib - On Xarelto. H/O CAD - S/P 3 Vessel CABG 2009.  P:  Telemetry Monitoring Vitals per unit protocol Trending Troponin I q6hr Checking TTE Holding Xarelto Holding , Aldactone, Demadex, & Multaq SQ heparin IV lopressor for hr >110  RENAL  A:   H/O CKD Stage III  P:   Trending UOP Daily electrolytes & renal function Replacing electrolytes as indicated  GASTROINTESTINAL A:   No acute issues.  P:   NPO Pepcid IV daily  HEMATOLOGIC A:   No acute issues. Systemic Anticoagulation - With Xarelto.  P:  Trending cell counts daily SCDs Holding Xarelto.  INFECTIOUS A:   Possible Aspiration Pneumonia  P:   Empiric Vancomycin & Cefepime Day #2 Awaiting culture results Trending PCT per algorithm .01 Low threshold to de-escalate antibiotics  ENDOCRINE Hypoglycemia A:   H/O DM Type II  P:   Accu-Checks q4hr Low dose SSI per algorithm Checking Hgb A1c  NEUROLOGIC A:   Acute Encephalopathy  Sedation on Ventilator H/O Hepatic Encephalopathy  P:   RASS goal: 0 to -1(wean sedation to eval neuro status) CT Head w/o with no acute abnormality  Propofol gtt Fentanyl IV prn  FAMILY  - Updates: Extensive family discussion with children at bedside 6/15 by Dr. Ashok Cordia. 6/15 30 minutes at bedside with daughter and Son. They report a 3 year decline in health status. They report the patient does not want to live on artifical means. We will try to extubate but if fails then terminal wean and hospice.  -  Inter-disciplinary family meet or Palliative Care meeting due by: 6/21  I Richardson Landry Minor ACNP [facility] PCCM Pager [phone] till 3 pm If no answer page [phone] 12/24/2015, 9:57 AM  Attending Note:  I have examined patient, reviewed labs, studies and notes. I have discussed the case with S Minor and Dr Ashok Cordia, and I agree with the data and plans as amended above. 75 yo chronically ill man with A Fib, DM, HTN w diastolic CHF and suspected chronic aspiration. He has had serial admissions and progressive decline in functional capacity over the last several months. He was admitted with altered MS and acute resp failure 6/14 pm. B infiltrates on CXR and clinical hx suggestive of aspiration PNA. On my eval he will track, does not follow commands (on sedation). He has coarse breath sounds. HR 110's. We will continue empiric abx pending any cx data. Note discussions between Dr Ashok Cordia and family, between S Minor and family. Pt would not want prolonged ICU support in absence of good chance for meaningful recovery. We will  assess over the next 48h for possible extubation. If unable to make progress then we will discuss possible withdrawal of care.  Independent critical care time is 34 minutes.   Christian Apo, MD, PhD 12/24/2015, 2:20 PM [facility] [phone] or if no answer [phone]

## 2015-12-24 NOTE — Progress Notes (Signed)
Dr. Lake Bells made aware of patient's BP's trending down into the [id number] systolic with MAP in the 0000000. MD okay with these pressures and will be notified if MAP's drop into the 50's.  No orders received at this time but will continue to monitor patient

## 2015-12-24 NOTE — Progress Notes (Signed)
PS trial attempted X2 today. Did not tolerate due to decreased pt effort. VT <200.  RT will monitor and re eval for wean again later today.

## 2015-12-25 ENCOUNTER — Inpatient Hospital Stay (HOSPITAL_COMMUNITY): Payer: Medicare HMO

## 2015-12-25 DIAGNOSIS — I509 Heart failure, unspecified: Secondary | ICD-10-CM

## 2015-12-25 LAB — CBC
HEMATOCRIT: 33.2 % — AB (ref 39.0–52.0)
Hemoglobin: 9.8 g/dL — ABNORMAL LOW (ref 13.0–17.0)
MCH: 26.7 pg (ref 26.0–34.0)
MCHC: 29.5 g/dL — ABNORMAL LOW (ref 30.0–36.0)
MCV: 90.5 fL (ref 78.0–100.0)
Platelets: 142 10*3/uL — ABNORMAL LOW (ref 150–400)
RBC: 3.67 MIL/uL — AB (ref 4.22–5.81)
RDW: 14.5 % (ref 11.5–15.5)
WBC: 7.7 10*3/uL (ref 4.0–10.5)

## 2015-12-25 LAB — ECHOCARDIOGRAM COMPLETE
AOPV: 0.36 m/s
AOVTI: 36.1 cm
AV Area VTI: 1.03 cm2
AV VEL mean LVOT/AV: 0.34
AV area mean vel ind: 0.45 cm2/m2
AV peak Index: 0.48
AV vel: 1.07
AVAREAMEANV: 0.97 cm2
AVAREAVTIIND: 0.5 cm2/m2
AVG: 9 mmHg
AVPG: 15 mmHg
AVPKVEL: 192 cm/s
CHL CUP AV VALUE AREA INDEX: 0.5
DOP CAL AO MEAN VELOCITY: 139 cm/s
FS: 24 % — AB (ref 28–44)
HEIGHTINCHES: 70 in
IV/PV OW: 0.85
LA diam end sys: 47 mm
LA diam index: 2.18 cm/m2
LA vol index: 39.4 mL/m2
LA vol: 84.9 mL
LASIZE: 47 mm
LAVOLA4C: 95.5 mL
LVOT SV: 39 mL
LVOT VTI: 13.6 cm
LVOT area: 2.84 cm2
LVOT peak VTI: 0.38 cm
LVOT peak vel: 69.9 cm/s
LVOTD: 19 mm
PW: 8.33 mm — AB (ref 0.6–1.1)
Valve area: 1.07 cm2
WEIGHTICAEL: 3255.75 [oz_av]

## 2015-12-25 LAB — GLUCOSE, CAPILLARY
GLUCOSE-CAPILLARY: 97 mg/dL (ref 65–99)
GLUCOSE-CAPILLARY: 149 mg/dL — AB (ref 65–99)
Glucose-Capillary: 94 mg/dL (ref 65–99)
Glucose-Capillary: 112 mg/dL — ABNORMAL HIGH (ref 65–99)

## 2015-12-25 LAB — PHOSPHORUS: Phosphorus: 1.8 mg/dL — ABNORMAL LOW (ref 2.5–4.6)

## 2015-12-25 LAB — BASIC METABOLIC PANEL
ANION GAP: 12 (ref 5–15)
BUN: 16 mg/dL (ref 6–20)
CALCIUM: 7.5 mg/dL — AB (ref 8.9–10.3)
CHLORIDE: 91 mmol/L — AB (ref 101–111)
CO2: 29 mmol/L (ref 22–32)
Creatinine, Ser: 1.13 mg/dL (ref 0.61–1.24)
GFR calc Af Amer: >60 mL/min (ref >60)
GLUCOSE: 380 mg/dL — AB (ref 65–99)
POTASSIUM: 3.4 mmol/L — AB (ref 3.5–5.1)
Sodium: 132 mmol/L — ABNORMAL LOW (ref 135–145)

## 2015-12-25 LAB — HEMOGLOBIN A1C
Hgb A1c MFr Bld: 5.5 % (ref 4.8–5.6)
Mean Plasma Glucose: 111 mg/dL

## 2015-12-25 LAB — URINE CULTURE: Culture: NO GROWTH

## 2015-12-25 LAB — TRIGLYCERIDES: Triglycerides: 67 mg/dL (ref <150)

## 2015-12-25 LAB — MAGNESIUM: Magnesium: 1.7 mg/dL (ref 1.7–2.4)

## 2015-12-25 LAB — PROCALCITONIN: Procalcitonin: <0.1 ng/mL

## 2015-12-25 MED ORDER — DEXMEDETOMIDINE HCL IN NACL 200 MCG/50ML IV SOLN
0.4000 ug/kg/h | INTRAVENOUS | Status: DC
  Administered 2015-12-25: 0.4 ug/kg/h via INTRAVENOUS
  Administered 2015-12-25: 0.6 ug/kg/h via INTRAVENOUS
  Filled 2015-12-25 (×5): qty 50

## 2015-12-25 MED ORDER — POTASSIUM PHOSPHATES 15 MMOLE/5ML IV SOLN
30.0000 mmol | Freq: Once | INTRAVENOUS | Status: AC
  Administered 2015-12-25: 30 mmol via INTRAVENOUS
  Filled 2015-12-25: qty 10

## 2015-12-25 MED ORDER — DEXMEDETOMIDINE HCL IN NACL 400 MCG/100ML IV SOLN
0.4000 ug/kg/h | INTRAVENOUS | Status: DC
  Administered 2015-12-25: 0.6 ug/kg/h via INTRAVENOUS
  Administered 2015-12-26: 0.5 ug/kg/h via INTRAVENOUS
  Filled 2015-12-25 (×3): qty 100

## 2015-12-25 MED ORDER — FAMOTIDINE IN NACL 20-0.9 MG/50ML-% IV SOLN
20.0000 mg | Freq: Two times a day (BID) | INTRAVENOUS | Status: DC
  Administered 2015-12-25 – 2015-12-27 (×4): 20 mg via INTRAVENOUS
  Filled 2015-12-25 (×6): qty 50

## 2015-12-25 NOTE — Progress Notes (Signed)
Nutrition Brief Note  Chart reviewed due to ventilator requirement. Discussed patient in ICU rounds with CCM physician today. Plans for terminal extubation and hospice / comfort care if patient fails extubation. No plans for nutrition support. Please consult RD if plans change and TF initiated.   Molli Barrows, RD, LDN, [place] Pager [phone] After Hours Pager [phone]

## 2015-12-25 NOTE — Progress Notes (Signed)
eLink Physician-Brief Progress Note Patient Name: Christian [name] DOB: [birth year] MRN: [id number]   Date of Service  12/25/2015  HPI/Events of Note  Patient on amio and precedex gtt both which cause bradycardia and hypotension.  Call from nurse reporting that patient's HR remains in AF but is brady to the 40s with increases up to the 70s.  BP of 89/54 (66).  eICU Interventions  Plan: Will d/c amio gtt  Continue with precedex for sedation  Continue to monitor patient     Intervention Category Intermediate Interventions: Arrhythmia - evaluation and management;Hypotension - evaluation and management  Azrael Huss 12/25/2015, 10:13 PM

## 2015-12-25 NOTE — Progress Notes (Signed)
  Echocardiogram 2D Echocardiogram has been performed.  Johny Chess 12/25/2015, 2:07 PM

## 2015-12-25 NOTE — Progress Notes (Signed)
PULMONARY / CRITICAL CARE MEDICINE   Name: Christian [name] MRN: [id number] DOB: [birth year]    ADMISSION DATE:  12/28/2015 CONSULTATION DATE:  12/19/2015  REFERRING MD:  EDP  CHIEF COMPLAINT:  Acute Hypoxic Respiratory Failure  HISTORY OF PRESENT ILLNESS:  Per the electronic medical record patient became increasingly lethargic and "wasn't acting like himself". Patient was unable to hold a fork dinner. Family called EMS. On arrival patient was saturating 84% on room air and drooling. Patient was unresponsive to sternal rub.. Patient was subsequently intubated in the emergency department after arrival and assessment by emergency department physician. Patient's family reports that he was hospitalized in February for aspiration pneumonia. Over the last month they have noticed a gradual decline with increasing fatigue and dyspnea on exertion. Patient had been compliant with his diuretic medications but family do report increasing edema that they know today. The patient has also had his Lopressor adjusted recently as well for persistent bradycardia. At approximately 5 PM patient was noted to have decreased level of consciousness and incontinence but was able to respond. Family reports that after contacting him later in the evening his altered mentation became evident with repeated dropping of the phone. Patient's family reports that they had planned to discuss palliative medicine at appointment with his general practitioner tomorrow.   SUBJECTIVE: Fails weaning attempts  VITAL SIGNS: BP 117/99 mmHg  Pulse 123  Temp(Src) 98.1 F (36.7 C) (Oral)  Resp 23  Ht 5\' 10"  (1.778 m)  Wt 203 lb 7.8 oz (92.3 kg)  BMI 29.20 kg/m2  SpO2 100%  HEMODYNAMICS:    VENTILATOR SETTINGS: Vent Mode:  [-] PRVC FiO2 (%):  [40 %] 40 % Set Rate:  [14 bmp] 14 bmp Vt Set:  [580 mL] 580 mL PEEP:  [5 cmH20] 5 cmH20 Plateau Pressure:  [30 cmH20-33 cmH20] 31 cmH20  INTAKE / OUTPUT: I/O last 3 completed shifts: In:  4312.5 [I.V.:3109.5; IV Piggyback:1203] Out: 1485 [Urine:1485]  PHYSICAL EXAMINATION: General:  Sedated. No acute distress. Follows commands Integument:  Warm & dry. No rash on exposed skin. Some dermal exfoliation on his feet bilaterally. HEENT:  No scleral injection or icterus. Endotracheal tube in place.  Cardiovascular: HR 110 Pitting lower extremity edema.  Pulmonary:  Diminished and coarse breath sounds bilaterally. Symmetric chest wall rise on ventilator. Abdomen: Soft. Normal bowel sounds. Nondistended. Musculoskeletal:  Normal bulk. No joint deformity or effusion appreciated. Neurological:  Patient overbreathing the vent. Pupils reactive. Withdraws extremities to pain. Tracks and follows Psychiatric:  Unable to assess given intubation and sedation.  LABS:  BMET  Recent Labs Lab 12/25/2015 2230 12/24/15 0527 12/25/15 0249  NA 140 141 132*  K 5.1 3.6 3.4*  CL 93* 104 91*  CO2 44* 29 29  BUN 26* 21* 16  CREATININE 1.32* 0.99 1.13  GLUCOSE 191* 67 380*    Electrolytes  Recent Labs Lab 12/22/2015 2230 12/24/15 0527 12/25/15 0249  CALCIUM 9.4 7.4* 7.5*  MG  --  1.7 1.7  PHOS  --  <1.0* 1.8*    CBC  Recent Labs Lab 12/10/2015 2230 12/24/15 0527 12/25/15 0249  WBC 7.4 8.4 7.7  HGB 12.0* 10.0* 9.8*  HCT 43.6 35.0* 33.2*  PLT 184 131* 142*    Coag's  Recent Labs Lab 12/24/15 0527  APTT 45*  INR 4.80*    Sepsis Markers  Recent Labs Lab 12/27/2015 2251 12/24/15 0105 12/25/15 0249  LATICACIDVEN 0.81  --   --   PROCALCITON  --  <0.10 <0.10  ABG  Recent Labs Lab 01/04/2016 2338 12/24/15 0415  PHART 7.520* 7.557*  PCO2ART 48.8* 40.8  PO2ART 510.0* 224*    Liver Enzymes  Recent Labs Lab 01/01/2016 2230 12/24/15 0527  AST 29  --   ALT 19  --   ALKPHOS 80  --   BILITOT 0.9  --   ALBUMIN 3.9 2.2*    Cardiac Enzymes  Recent Labs Lab 12/24/15 0527 12/24/15 1410  TROPONINI 0.08* 0.14*    Glucose  Recent Labs Lab 12/24/15 0817  12/24/15 1158 12/24/15 1514 12/24/15 2007 12/24/15 2343 12/25/15 0818  GLUCAP 124* 87 90 86 82 94    Imaging Dg Chest Port 1 View  12/25/2015  CLINICAL DATA:  Respiratory failure EXAM: PORTABLE CHEST 1 VIEW COMPARISON:  12/26/2015 FINDINGS: Cardiac shadow is stable. Postoperative changes are again seen. Endotracheal tube and nasogastric catheter are noted. The nasogastric catheter could be advanced slightly as the proximal side port lies within the distal esophagus. Mild left basilar atelectasis is seen. No bony abnormality is noted. IMPRESSION: Left basilar atelectasis. Nasogastric catheter with proximal side port in the distal esophagus. This could be advanced somewhat. Electronically Signed   By: Inez Catalina M.D.   On: 12/25/2015 07:12     STUDIES:  PORT CXR 6/14:  Endotracheal tube in acceptable position. Low lung volumes. Blunting of left costophrenic angle & questionable right midlung opacity. CT HEAD W/O 6/15 >>neg TTE 6/15 >>  MICROBIOLOGY: Blood Ctx x2 6/14 >> Urine Ctx 6/14 >>neg MRSA PCR 6/14 >> Tracheal Asp Ctx 6/14 >>  ANTIBIOTICS: Cefepime 6/14 >> Vancomycin 6/14 >>  SIGNIFICANT EVENTS: 6/14 - Admit  LINES/TUBES: OETT 7.5 6/14 >> Foley 6/14 >> PIV x3  DISCUSSION:  75 year old male with acute on chronic hypoxic and hypercarbic respiratory failure. Unclear etiology as patient's BNP does not appear above baseline. CT head pending for possible intracranial hemorrhage given chronic Xarelto with chronic renal failure. Continuing empiric cataracts with low threshold to de-escalate depending upon Procalcitonin and culture data. Lengthy discussion with the patient's family at bedside regarding goals of care. Plan to consult palliative medicine in the morning and patient will remain intubated but be DO NOT RESUSCITATE otherwise.  ASSESSMENT / PLAN:  PULMONARY A: Acute on Chronic Hypoxic Respiratory Failure - Baseline 2 L/m requirement. Chronic Hypercarbic Respiratory  Failure - Previously followed with [facility]. On nocturnal NIPPV. Possible Aspiration Pneumonia  P:   Full Vent Support SBT & WUA  As tolerated, not weaning 6/16  CARDIOVASCULAR A:  H/O Chronic Diastolic CHF H/O Hyperlipidemia H/O HTN H/O Chronic A fib - On Xarelto. H/O CAD - S/P 3 Vessel CABG 2009.  P:  Telemetry Monitoring Vitals per unit protocol Trending Troponin I q6hr Checking TTE Holding Xarelto Holding , Aldactone, Demadex, & Multaq SQ heparin IV lopressor for hr >110 Failed dilt and placed on amio drip  RENAL  A:   H/O CKD Stage III  P:   Trending UOP Daily electrolytes & renal function Replacing electrolytes as indicated  GASTROINTESTINAL A:   No acute issues.  P:   NPO Pepcid IV daily  HEMATOLOGIC A:   No acute issues. Systemic Anticoagulation - With Xarelto.  P:  Trending cell counts daily SCDs Holding Xarelto.  INFECTIOUS A:   Possible Aspiration Pneumonia  P:   Empiric Vancomycin & Cefepime Day #3 Awaiting culture results Trending PCT per algorithm .01 Low threshold to de-escalate antibiotics  ENDOCRINE Hypoglycemia A:   H/O DM Type II  P:   Accu-Checks q4hr  Low dose SSI per algorithm Checking Hgb A1c  NEUROLOGIC A:   Acute Encephalopathy , more awake 6/16 Sedation on Ventilator H/O Hepatic Encephalopathy  P:   RASS goal: 0 to -1(wean sedation to eval neuro status) CT Head w/o with no acute abnormality  Propofol gtt Fentanyl IV prn  FAMILY  - Updates: Extensive family discussion with children at bedside 6/15 by Dr. Ashok Cordia. 6/15 30 minutes at bedside with daughter and Son. They report a 3 year decline in health status. They report the patient does not want to live on artifical means. We will try to extubate but if fails then terminal wean and hospice. 6/15 long discussion on goals of care with family with no prolonged life support as goal with terminal wean if unable to extubate.  RB discussed status and minimal progress  with daughter and son-in-law at bedside 6/16. They understand that if we do not see progress over the weekend then we will likely need to pursue comfort measures.   - Inter-disciplinary family meet or Palliative Care meeting due by: 6/21  I Richardson Landry Minor ACNP [facility] PCCM Pager [phone] till 3 pm If no answer page [phone] 12/25/2015, 10:11 AM   Attending Note:  I have examined patient, reviewed labs, studies and notes. I have discussed the case with S Minor, and I agree with the data and plans as amended above. 75 yo chronically ill man with A Fib, DM, HTN w diastolic CHF and suspected chronic aspiration. He has had serial admissions and progressive decline in functional capacity over the last several months. He was admitted with altered MS and acute resp failure 6/14 pm. B infiltrates on CXR and clinical hx suggestive of aspiration PNA. On my eval he will track, does not follow commands (on sedation). He has coarse breath sounds. We will continue empiric abx pending any cx data. Discussed status and prognosis with his daughter today. We will push for SBT and improvement through the weekend. If no change then we will likely transition to comfort measures based on his stated wishes. Independent critical care time is 38 minutes.   Baltazar Apo, MD, PhD 12/25/2015, 2:31 PM [facility] [phone] or if no answer [phone]

## 2015-12-25 NOTE — Progress Notes (Signed)
[facility] Progress Note Patient Name: Christian [name] DOB: June 28, 1941 MRN: [id number]   Date of Service  12/25/2015  HPI/Events of Note  Hypokalemia and hypophosphatemia  eICU Interventions  Potassium and phos replaced     Intervention Category Intermediate Interventions: Electrolyte abnormality - evaluation and management  DETERDING,ELIZABETH 12/25/2015, 4:07 AM

## 2015-12-26 DIAGNOSIS — Z515 Encounter for palliative care: Secondary | ICD-10-CM

## 2015-12-26 DIAGNOSIS — J9622 Acute and chronic respiratory failure with hypercapnia: Secondary | ICD-10-CM

## 2015-12-26 DIAGNOSIS — Z7189 Other specified counseling: Secondary | ICD-10-CM

## 2015-12-26 DIAGNOSIS — J189 Pneumonia, unspecified organism: Secondary | ICD-10-CM

## 2015-12-26 DIAGNOSIS — J9621 Acute and chronic respiratory failure with hypoxia: Secondary | ICD-10-CM

## 2015-12-26 DIAGNOSIS — J810 Acute pulmonary edema: Secondary | ICD-10-CM

## 2015-12-26 LAB — GLUCOSE, CAPILLARY
GLUCOSE-CAPILLARY: 103 mg/dL — AB (ref 65–99)
GLUCOSE-CAPILLARY: 96 mg/dL (ref 65–99)
GLUCOSE-CAPILLARY: 100 mg/dL — AB (ref 65–99)
Glucose-Capillary: 108 mg/dL — ABNORMAL HIGH (ref 65–99)
Glucose-Capillary: 83 mg/dL (ref 65–99)

## 2015-12-26 LAB — PROCALCITONIN

## 2015-12-26 LAB — BASIC METABOLIC PANEL
ANION GAP: 7 (ref 5–15)
BUN: 14 mg/dL (ref 6–20)
CO2: 28 mmol/L (ref 22–32)
Calcium: 7.8 mg/dL — ABNORMAL LOW (ref 8.9–10.3)
Chloride: 98 mmol/L — ABNORMAL LOW (ref 101–111)
Creatinine, Ser: 1.12 mg/dL (ref 0.61–1.24)
GFR calc Af Amer: >60 mL/min (ref >60)
GLUCOSE: 98 mg/dL (ref 65–99)
POTASSIUM: 3.9 mmol/L (ref 3.5–5.1)
Sodium: 133 mmol/L — ABNORMAL LOW (ref 135–145)

## 2015-12-26 MED ORDER — ONDANSETRON HCL 4 MG/2ML IJ SOLN
4.0000 mg | Freq: Four times a day (QID) | INTRAMUSCULAR | Status: DC | PRN
  Administered 2015-12-26: 4 mg via INTRAVENOUS
  Filled 2015-12-26: qty 2

## 2015-12-26 MED ORDER — AMIODARONE HCL IN DEXTROSE 360-4.14 MG/200ML-% IV SOLN: 30.0000 mg/h | INTRAVENOUS | Status: DC

## 2015-12-26 MED ORDER — SODIUM CHLORIDE 0.9 % IV SOLN
INTRAVENOUS | Status: DC
  Administered 2015-12-26: 18:00:00 via INTRAVENOUS
  Administered 2015-12-27: 75 mL/h via INTRAVENOUS

## 2015-12-26 MED ORDER — ONDANSETRON HCL 4 MG/2ML IJ SOLN
4.0000 mg | Freq: Four times a day (QID) | INTRAMUSCULAR | Status: DC | PRN
Start: 2015-12-26 — End: 2015-12-26

## 2015-12-26 MED ORDER — SODIUM CHLORIDE 0.9 % IV BOLUS (SEPSIS)
1000.0000 mL | Freq: Once | INTRAVENOUS | Status: AC
  Administered 2015-12-26: 1000 mL via INTRAVENOUS

## 2015-12-26 MED ORDER — MORPHINE SULFATE (PF) 2 MG/ML IV SOLN: 1.0000 mg | INTRAVENOUS | Status: DC | PRN

## 2015-12-26 MED ORDER — SODIUM CHLORIDE 0.9 % IV BOLUS (SEPSIS)
500.0000 mL | Freq: Once | INTRAVENOUS | Status: AC
  Administered 2015-12-26: 500 mL via INTRAVENOUS

## 2015-12-26 MED ORDER — AMIODARONE HCL IN DEXTROSE 360-4.14 MG/200ML-% IV SOLN
30.0000 mg/h | INTRAVENOUS | Status: DC
  Administered 2015-12-26 – 2015-12-27 (×3): 30 mg/h via INTRAVENOUS
  Filled 2015-12-26 (×5): qty 200

## 2015-12-26 NOTE — Progress Notes (Signed)
eLink Physician-Brief Progress Note Patient Name: Christian [name] DOB: 06/06/1941 MRN: [id number]   Date of Service  12/26/2015  HPI/Events of Note  Pt hypotensive.  eICU Interventions  Will give fluid bolus NS 1 liter.     Intervention Category Major Interventions: Other:  Aiyla Baucom 12/26/2015, 3:50 PM

## 2015-12-26 NOTE — Consult Note (Signed)
Consultation Note Date: 12/26/2015   Patient Name: Christian [name]  DOB: Nov 29, 1940  MRN: [id number]  Age / Sex: 75 y.o., male  PCP: Tonia Ghent, MD Referring Physician: Rush Farmer, MD  Reason for Consultation: Establishing goals of care, Hospice Evaluation, Pain control, Psychosocial/spiritual support and Terminal Care  HPI/Patient Profile: 75 y.o. 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 Upon arrival to the emergency room he was saturating at 84% on room air and was drooling. He was unresponsive to a sternal rub. Subsequently he was intubated in the emergency department. Per family patient has been declining since his admission in April for aspiration pneumonia. Patient lives alone and has been requiring more help in the house. They've noted that he is too weak to even fix coffee, often times is not leaving the bed, and has only been eating bites and sips since that time.. Patient was extubated 12/26/2015. While intubated he has required Precedex for agitation Kaylyn Lim amiodarone for chronic atrial fib. Husband if heart and amiodarone was stopped as well as hyp0rtensive. Family shares prior to being admitted to the hospital they had planned to meet with his primary care provider to discuss advance directives given his sharp decline since April. Patient was admitted with acute on chronic hypoxic as well as hypercarbic respiratory failure and possible aspiration pneumonia  Clinical Assessment and Goals of Care: Patient is fatigued and tired after being extubated. He is hard of hearing  and does not have his hearing aids. He has 1 daughter and 1 son who are at the bedside. He shares with them that he is tired and that he would not want to be intubated again. He is now a DO NOT RESUSCITATE. His son and daughter share that maintaining his independence and living in his apartment has always been very important to him but they see him as not been able to function in his home as he once did. As noted above he has had a sharp functional decline since April. He no longer enjoys the things that he once did such as watching the birds from his bedroom window; his daughter shares he often times doesn't even open the blinds that he is too fatigued to get out of bed. Family has been cleaning his apartment and making meals for him but even with assistance they find that he is not eating what they have prepared and he tells him he simply can't even get up to fix his coffee. He has been followed by a home health nurse who  appears to be preparing the family for patient's current condition in terms of trending towards end-of-life. Family has been struggling with these issues as patient with heart failure has been one who has repeatedly defied odds and has "bounced back before" but  they see a difference in him at this time and feel that he may no longer be able to do this anymore.  Pt is still making decisions for himself but he is advocating more to his children. He has 1 son and 1 daughter and they are making decisions jointly    SUMMARY OF RECOMMENDATIONS   DNR/DNI. Patient would not want to be reintubated if he were to develop respiratory distress again Goals for patient not to return to the hospital Continue to support patient's goals of comfort and to continue by mouth medicines only as tolerated Family wishes to pursue inpatient hospice. They have had family members to pass at [facility] inpatient hospital unit and have been pleased with the philosophy as well as level of care there Code  Status/Advance Care Planning:  DNR    Symptom Management:   Dyspnea: We'll add morphine to his profile 1-2 mg IV every 2 when necessary for shortness of breath. Continue BiPAP at night as previously ordered. Goal is not to advance BiPAP for further support during the day. Continue O2 otherwise as tolerated via nasal cannula, nebulizer treatments  Pain: Patient has had chronic neck and back pain but no current complaints. May use Tylenol for mild to moderate pain and morphine  for more severe pain  Palliative Prophylaxis:   Aspiration, Bowel Regimen, Frequent Pain Assessment, Oral Care and Turn Reposition  Additional Recommendations (Limitations, Scope, Preferences):  Avoid Hospitalization, Minimize Medications, Initiate Comfort Feeding, No Artificial Feeding, No Blood Transfusions, No Hemodialysis, No Radiation, No Surgical Procedures and No Tracheostomy  Psycho-social/Spiritual:   Desire for further Chaplaincy support:no  Additional Recommendations: Grief/Bereavement Support  Prognosis:   < 4 weeks. Patient is a high risk for acute respiratory failure. Patient extubated well but unlikely to be able to maintain respiratory status without more invasive support for much longer. Patient has multiple comorbidities: Coronary artery disease with CABG, diabetes, diastolic heart failure, chronic atrial fib with RVR, hypoventilation syndrome, history of CO2 retention, hepatic encephalopathy, and now 2 episodes of aspiration pneumonia further impacting his respiratory failure. Family shares also that conversations with her father that sounds very much like nearing death awareness is that he is talking about his deceased brother dreaming of him, and verbalizing fatigue and readiness  Discharge Planning: We'll present hospice inpatient option upon discharge during follow-up appointment 12/27/2015      Primary Diagnoses: Present on Admission:  . Acute on chronic respiratory failure with  hypoxia ([place])  I have reviewed the medical record, interviewed the patient and family, and examined the patient. The following aspects are pertinent.  Past Medical History  Diagnosis Date  . Coronary artery disease     a. 2009 s/p CABG x 3; 09/2007 Cath: patent grafts.  . Type II diabetes mellitus ([place])   . Hyperlipidemia   . Hypertensive heart disease   . GERD (gastroesophageal reflux disease)   . Arthritis   . Diastolic CHF, chronic ([place])     a. 04/2012 Echo: EF >55%, no rwma, mod conc LVH, mod dil LA, Triv MR/TR.  Marland Kitchen Nephrolithiasis   . Chronic atrial fibrillation ([place])     a. DCCV 04/2010-->reverted to AF by 2012;  b. CHA2DS2VASc = 4->Xarelto.  . CO2 retention     during admission to [facility] 2013  . CKD (chronic kidney disease), stage III   . Hepatic encephalopathy ([place])     2013  . 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 Chronic respiratory failure with  hypoxia [facility])    Social History   Social History  . Marital Status: Single    Spouse Name: N/A  . Number of Children: 2  . Years of Education: N/A   Occupational History  . Retired     [employer]   Social History Main Topics  . Smoking status: Former Smoker -- 1.50 packs/day for 30 years    Types: Cigarettes    Quit date: 07/11/1981  . Smokeless tobacco: Never Used  . Alcohol Use: No  . Drug Use: No  . Sexual Activity: Not Asked   Other Topics Concern  . None   Social History Narrative   Divorced 2000.  Lives in [place] by himself.   1 son, 1 daughter, both local   Retired from [employer]   Family History  Problem Relation Age of Onset  . Heart disease Brother   . Arthritis Mother   . Hypertension Mother   . Aneurysm Mother   . Heart disease Father   . Hyperlipidemia Father   . Stroke Father   . Diabetes Father   . Heart failure Father   . Heart disease Sister   . Colon cancer Neg Hx   . Prostate cancer Neg Hx   . Heart disease Brother    Scheduled Meds: .  antiseptic oral rinse  7 mL Mouth Rinse QID  . chlorhexidine gluconate (SAGE KIT)  15 mL Mouth Rinse BID  . famotidine (PEPCID) IV  20 mg Intravenous Q12H  . heparin subcutaneous  5,000 Units Subcutaneous Q8H  . insulin aspart  0-9 Units Subcutaneous Q4H  . metoprolol  5 mg Intravenous Q6H   Continuous Infusions: . sodium chloride 75 mL/hr at 12/26/15 1756  . amiodarone 30 mg/hr (12/26/15 1500)   PRN Meds:.sodium chloride, morphine injection, ondansetron Medications Prior to Admission:  Prior to Admission medications   Medication Sig Start Date End Date Taking? Authorizing Provider  glucosamine-chondroitin 500-400 MG tablet Take 1 tablet by mouth daily.   Yes Historical Provider, MD  ipratropium (ATROVENT) 0.03 % nasal spray Place 2 sprays into both nostrils daily after breakfast.    Yes Historical Provider, MD  metoprolol tartrate (LOPRESSOR) 25 MG tablet Take 12.5 mg by mouth 2 (two) times daily.    Yes Historical Provider, MD  MULTAQ 400 MG tablet TAKE 1 TABLET(400 MG) BY MOUTH TWICE DAILY WITH A MEAL 11/16/15  Yes Minna Merritts, MD  nitroGLYCERIN (NITROSTAT) 0.4 MG SL tablet Place 0.4 mg under the tongue every 5 (five) minutes as needed for chest pain.    Yes Historical Provider, MD  omeprazole (PRILOSEC) 20 MG capsule Take 20 mg by mouth daily.   Yes Historical Provider, MD  OXYGEN Inhale 2 L into the lungs continuous.   Yes Historical Provider, MD  potassium chloride (K-DUR,KLOR-CON) 10 MEQ tablet Take 10 mEq by mouth every other day.   Yes Historical Provider, MD  rivaroxaban (XARELTO) 20 MG TABS tablet Take 1 tablet (20 mg total) by mouth daily. Patient taking differently: Take 20 mg by mouth daily with supper.  08/10/15  Yes Minna Merritts, MD  spironolactone (ALDACTONE) 25 MG tablet Take 25 mg by mouth every other day.  01/31/13  Yes Tonia Ghent, MD  tamsulosin (FLOMAX) 0.4 MG CAPS capsule Take 0.4 mg by mouth daily after supper.   Yes Historical Provider, MD  torsemide  (DEMADEX) 20 MG tablet Take 20 mg by mouth every other day.   Yes Historical Provider, MD  triamcinolone cream (  KENALOG) 0.1 % Apply 1 application topically 2 (two) times daily as needed (itching).    Yes Historical Provider, MD   Allergies  Allergen Reactions  . Ambien [Zolpidem Tartrate]     Over sedation  . Crestor [Rosuvastatin Calcium]     myalgias  . Other     Sedative or medications for sleep.  . Pradaxa [Dabigatran Etexilate Mesylate]     Inflammation in throat.  . Tramadol     Intolerant but not an allergy; sedation   Review of Systems  Unable to perform ROS: Mental status change    Physical Exam  Constitutional: He is oriented to person, place, and time. He appears well-developed.  HENT:  Head: Normocephalic and atraumatic.  Neck: Normal range of motion.  Cardiovascular: Normal rate.   Pulmonary/Chest:  shallow  Abdominal: Soft.  Genitourinary:  foley  Musculoskeletal: Normal range of motion.  Neurological: He is alert and oriented to person, place, and time.  HOH Short term memory deficits present. Oriented to person, place and his general situation: that he is in the hospital; no memory of events leading up to intubation  Skin: Skin is warm.  Psychiatric:  Tired, fatigued  Nursing note and vitals reviewed.   Vital Signs: BP 85/61 mmHg  Pulse 128  Temp(Src) 97.8 F (36.6 C) (Oral)  Resp 23  Ht 5' 10"  (1.778 m)  Wt 92.1 kg (203 lb 0.7 oz)  BMI 29.13 kg/m2  SpO2 100% Pain Assessment: CPOT       SpO2: SpO2: 100 % O2 Device:SpO2: 100 % O2 Flow Rate: .O2 Flow Rate (L/min): 4 L/min  IO: Intake/output summary:  Intake/Output Summary (Last 24 hours) at 12/26/15 1912 Last data filed at 12/26/15 1800  Gross per 24 hour  Intake 1920.04 ml  Output   1080 ml  Net 840.04 ml    LBM:   Baseline Weight: Weight: 90.719 kg (200 lb) Most recent weight: Weight: 92.1 kg (203 lb 0.7 oz)     Palliative Assessment/Data:   Flowsheet Rows        Most Recent  Value   Intake Tab    Referral Department  Critical care   Unit at Time of Referral  ICU   Palliative Care Primary Diagnosis  Cardiac   Date Notified  12/26/15   Palliative Care Type  New Palliative care   Reason for referral  Clarify Goals of Care, Counsel Regarding Hospice   Date of Admission  12/21/2015   Date first seen by Palliative Care  12/26/15   # of days Palliative referral response time  0 Day(s)   # of days IP prior to Palliative referral  3   Clinical Assessment    Palliative Performance Scale Score  30%   Pain Max last 24 hours  Not able to report   Pain Min Last 24 hours  Not able to report   Dyspnea Max Last 24 Hours  Not able to report   Dyspnea Min Last 24 hours  Not able to report   Nausea Max Last 24 Hours  Not able to report   Nausea Min Last 24 Hours  Not able to report   Anxiety Max Last 24 Hours  Not able to report   Anxiety Min Last 24 Hours  Not able to report   Other Max Last 24 Hours  Not able to report   Psychosocial & Spiritual Assessment    Palliative Care Outcomes    Who was at the meeting?  son and  dtr   Palliative Care Outcomes  Improved pain interventions, Improved non-pain symptom therapy, Clarified goals of care, Counseled regarding hospice, Transitioned to hospice   Patient/Family wishes: Interventions discontinued/not started   Mechanical Ventilation, Trach, NIPPV, Hemodialysis, PEG, Vasopressors, Tube feedings/TPN   Palliative Care follow-up planned  Yes, Facility      Time In: 1700 Time Out: 1820 Time Total: 80 min Greater than 50%  of this time was spent counseling and coordinating care related to the above assessment and plan. Staffed with Marni Griffon, NP  Signed by: Dory Horn, NP   Please contact Palliative Medicine Team phone at [phone] for questions and concerns.  For individual provider: See Shea Evans

## 2015-12-26 NOTE — Progress Notes (Signed)
PULMONARY / CRITICAL CARE MEDICINE   Name: Christian [name] LETTER MRN: [id number] DOB: 04-Apr-1941    ADMISSION DATE:  12/22/2015 CONSULTATION DATE:  12/28/2015  REFERRING MD:  EDP  CHIEF COMPLAINT:  Acute Hypoxic Respiratory Failure  HISTORY OF PRESENT ILLNESS:  Per the electronic medical record patient became increasingly lethargic and "wasn't acting like himself". Patient was unable to hold a fork dinner. Family called EMS. On arrival patient was saturating 84% on room air and drooling. Patient was unresponsive to sternal rub.. Patient was subsequently intubated in the emergency department after arrival and assessment by emergency department physician. Patient's family reports that he was hospitalized in February for aspiration pneumonia. Over the last month they have noticed a gradual decline with increasing fatigue and dyspnea on exertion. Patient had been compliant with his diuretic medications but family do report increasing edema that they know today. The patient has also had his Lopressor adjusted recently as well for persistent bradycardia. At approximately 5 PM patient was noted to have decreased level of consciousness and incontinence but was able to respond. Family reports that after contacting him later in the evening his altered mentation became evident with repeated dropping of the phone. Patient's family reports that they had planned to discuss palliative medicine at appointment with his general practitioner tomorrow.   SUBJECTIVE Ready for extubation  VITAL SIGNS: BP 105/79 mmHg  Pulse 32  Temp(Src) 97.6 F (36.4 C) (Oral)  Resp 28  Ht 5\' 10"  (1.778 m)  Wt 203 lb 0.7 oz (92.1 kg)  BMI 29.13 kg/m2  SpO2 98%  HEMODYNAMICS:    VENTILATOR SETTINGS: Vent Mode:  [-] CPAP;PSV FiO2 (%):  [40 %] 40 % Set Rate:  [14 bmp] 14 bmp Vt Set:  [580 mL] 580 mL PEEP:  [5 cmH20] 5 cmH20 Pressure Support:  [10 cmH20] 10 cmH20 Plateau Pressure:  [30 cmH20-36 cmH20] 34 cmH20  INTAKE /  OUTPUT: I/O last 3 completed shifts: In: 4020.5 [I.V.:1943.5; IV [id number] Out: [id number]  PHYSICAL EXAMINATION: General:  Sedated. No acute distress. Follows commands Integument:  Warm & dry. No rash on exposed skin. Some dermal exfoliation on his feet bilaterally. HEENT:  No scleral injection or icterus. Endotracheal tube in place.  Cardiovascular: irreg .  Pulmonary:  Diminished and coarse breath sounds bilaterally. Symmetric chest wall rise on ventilator. Excellent Vt on PSV Abdomen: Soft. Normal bowel sounds. Nondistended. Musculoskeletal:  Normal bulk. No joint deformity or effusion appreciated. Neurological:  Awake. Pupils reactive. Withdraws extremities to pain. Tracks and follows Psychiatric:  Unable to assess given intubation and sedation.  LABS:  BMET  Recent Labs Lab 12/24/15 0527 12/25/15 0249 12/26/15 0320  NA 141 132* 133*  K 3.6 3.4* 3.9  CL 104 91* 98*  CO2 29 29 28   BUN 21* 16 14  CREATININE 0.99 1.13 1.12  GLUCOSE 67 380* 98    Electrolytes  Recent Labs Lab 12/24/15 0527 12/25/15 0249 12/26/15 0320  CALCIUM 7.4* 7.5* 7.8*  MG 1.7 1.7  --   PHOS <1.0* 1.8*  --     CBC  Recent Labs Lab 12/19/2015 2230 12/24/15 0527 12/25/15 0249  WBC 7.4 8.4 7.7  HGB 12.0* 10.0* 9.8*  HCT 43.6 35.0* 33.2*  PLT 184 131* 142*    Coag's  Recent Labs Lab 12/24/15 0527  APTT 45*  INR 4.80*    Sepsis Markers  Recent Labs Lab 12/10/2015 2251 12/24/15 0105 12/25/15 0249 12/26/15 0320  LATICACIDVEN 0.81  --   --   --  PROCALCITON  --  <0.10 <0.10 <0.10    ABG  Recent Labs Lab 01/04/2016 2338 12/24/15 0415  PHART 7.520* 7.557*  PCO2ART 48.8* 40.8  PO2ART 510.0* 224*    Liver Enzymes  Recent Labs Lab 12/20/2015 2230 12/24/15 0527  AST 29  --   ALT 19  --   ALKPHOS 80  --   BILITOT 0.9  --   ALBUMIN 3.9 2.2*    Cardiac Enzymes  Recent Labs Lab 12/24/15 0527 12/24/15 1410  TROPONINI 0.08* 0.14*     Glucose  Recent Labs Lab 12/25/15 1138 12/25/15 2008 12/25/15 2322 12/26/15 0303 12/26/15 0803 12/26/15 1154  GLUCAP 97 149* 112* 103* 96 100*    Imaging No results found.   STUDIES:  PORT CXR 6/14:  Endotracheal tube in acceptable position. Low lung volumes. Blunting of left costophrenic angle & questionable right midlung opacity. CT HEAD W/O 6/15 >>neg TTE 6/15 >>  MICROBIOLOGY: Blood Ctx x2 6/14 >> Urine Ctx 6/14 >>neg MRSA PCR 6/14 >> Tracheal Asp Ctx 6/14 >>  ANTIBIOTICS: Cefepime 6/14 >> Vancomycin 6/14 >>  SIGNIFICANT EVENTS: 6/14 - Admit  LINES/TUBES: OETT 7.5 6/14 >> Foley 6/14 >> PIV x3  DISCUSSION:   75 year old male with acute on chronic hypoxic and hypercarbic respiratory failure. Unclear etiology as patient's BNP does not appear above baseline. Lengthy discussion with the patient's family at bedside regarding goals of care. He is ready for extubation. Will extubate, place on mandatory HS BIPAP and as needed. He may need comfort care sooner. Will ask palliative to see at family request.   ASSESSMENT / PLAN:  PULMONARY A: Acute on Chronic Hypoxic Respiratory Failure - Baseline 2 L/m requirement. Chronic Hypercarbic Respiratory Failure - Previously followed with [facility]. On nocturnal NIPPV. Possible Aspiration Pneumonia  P:   Full Vent Support Extubate PRN and HS bipap May transition to comfort.   CARDIOVASCULAR A:  H/O Chronic Diastolic CHF H/O Hyperlipidemia H/O HTN H/O Chronic A fib - On Xarelto. H/O CAD - S/P 3 Vessel CABG 2009.  P:  Telemetry Monitoring Vitals per unit protocol Trending Troponin I q6hr Checking TTE Holding Xarelto Holding , Aldactone, Demadex, & Multaq SQ heparin IV lopressor for hr >110   RENAL  A:   H/O CKD Stage III  P:   Trending UOP Daily electrolytes & renal function Replacing electrolytes as indicated  GASTROINTESTINAL A:   No acute issues.  P:   NPO Pepcid IV daily  HEMATOLOGIC A:    No acute issues. Systemic Anticoagulation - With Xarelto.  P:  Trending cell counts daily SCDs Holding Xarelto.  INFECTIOUS A:   Possible Aspiration Pneumonia  P:   Empiric Vancomycin & Cefepime Day #4-->DC as neg PCT and cultures  Awaiting culture results  ENDOCRINE Hypoglycemia A:   H/O DM Type II  P:   Accu-Checks q4hr Low dose SSI per algorithm Checking Hgb A1c  NEUROLOGIC A:   Acute Encephalopathy , more awake 6/16-->follows commands  H/O Hepatic Encephalopathy  P:   RASS goal: 0 to -1(wean sedation to eval neuro status) Propofol gtt d/c'd  Fentanyl IV prn  [place] ACNP-BC [place] Pager # [phone] OR # [phone] if no answer  12/26/2015, 12:21 PM  Attending Note:  75 year old male with CHF presenting with aspiration and respiratory failure.  On exam, patient is weaning well and lungs with bibasilar crackles.  Post extubation, use BiPAP if needed but no reintubation as patient never wanted prolonged support.  Discussed again with family, they agree on plan which is to extubate one way and if fails then will progress to comfort care but in the meantime, make DNR post extubation.  The patient is critically ill with multiple organ systems failure and requires high complexity decision making for assessment and support, frequent evaluation and titration of therapies, application of advanced monitoring technologies and extensive interpretation of multiple databases.   Critical Care Time devoted to patient care services described in this note is  35  Minutes. This time reflects time of care of this signee Dr Jennet Maduro. This critical care time does not reflect procedure time, or teaching time or supervisory time of PA/NP/Med student/Med Resident etc but could involve care discussion time.  Rush Farmer, M.D. [facility] Pulmonary/Critical Care Medicine. Pager: [phone]. After hours pager: [phone].

## 2015-12-26 NOTE — Procedures (Signed)
Extubation Procedure Note  Patient Details:   Name: Christian [name] DOB: 1941-03-29 MRN: [id number]   Airway Documentation:    + air leak test prior to extubation.  Evaluation  O2 sats: stable throughout Complications: No apparent complications Patient did tolerate procedure well. Bilateral Breath Sounds: Diminished, Clear, Other (Comment) (coarse)   Yes, pt able to speak.  NO distress noted, no stridor noted. Pt denies SOB.  [name] 12/26/2015, 2:31 PM

## 2015-12-27 DIAGNOSIS — I4891 Unspecified atrial fibrillation: Secondary | ICD-10-CM

## 2015-12-27 DIAGNOSIS — R5381 Other malaise: Secondary | ICD-10-CM

## 2015-12-27 DIAGNOSIS — Z515 Encounter for palliative care: Secondary | ICD-10-CM | POA: Insufficient documentation

## 2015-12-27 DIAGNOSIS — J189 Pneumonia, unspecified organism: Secondary | ICD-10-CM | POA: Insufficient documentation

## 2015-12-27 LAB — CBC
HEMATOCRIT: 40.4 % (ref 39.0–52.0)
Hemoglobin: 12.1 g/dL — ABNORMAL LOW (ref 13.0–17.0)
MCH: 27.1 pg (ref 26.0–34.0)
MCHC: 30 g/dL (ref 30.0–36.0)
MCV: 90.6 fL (ref 78.0–100.0)
PLATELETS: 153 10*3/uL (ref 150–400)
RBC: 4.46 MIL/uL (ref 4.22–5.81)
RDW: 14.6 % (ref 11.5–15.5)
WBC: 7.4 10*3/uL (ref 4.0–10.5)

## 2015-12-27 LAB — BASIC METABOLIC PANEL
Anion gap: 7 (ref 5–15)
BUN: 12 mg/dL (ref 6–20)
CALCIUM: 8.1 mg/dL — AB (ref 8.9–10.3)
CO2: 28 mmol/L (ref 22–32)
CREATININE: 0.99 mg/dL (ref 0.61–1.24)
Chloride: 101 mmol/L (ref 101–111)
GFR calc Af Amer: >60 mL/min (ref >60)
GLUCOSE: 70 mg/dL (ref 65–99)
Potassium: 4.1 mmol/L (ref 3.5–5.1)
Sodium: 136 mmol/L (ref 135–145)

## 2015-12-27 LAB — GLUCOSE, CAPILLARY
GLUCOSE-CAPILLARY: 68 mg/dL (ref 65–99)
GLUCOSE-CAPILLARY: 81 mg/dL (ref 65–99)
Glucose-Capillary: 118 mg/dL — ABNORMAL HIGH (ref 65–99)
Glucose-Capillary: 146 mg/dL — ABNORMAL HIGH (ref 65–99)
Glucose-Capillary: 146 mg/dL — ABNORMAL HIGH (ref 65–99)
Glucose-Capillary: 58 mg/dL — ABNORMAL LOW (ref 65–99)
Glucose-Capillary: 62 mg/dL — ABNORMAL LOW (ref 65–99)
Glucose-Capillary: 72 mg/dL (ref 65–99)
Glucose-Capillary: 78 mg/dL (ref 65–99)
Glucose-Capillary: 90 mg/dL (ref 65–99)

## 2015-12-27 MED ORDER — CHLORHEXIDINE GLUCONATE 0.12 % MT SOLN
15.0000 mL | Freq: Two times a day (BID) | OROMUCOSAL | Status: DC
  Administered 2015-12-28 – 2015-12-29 (×2): 15 mL via OROMUCOSAL
  Filled 2015-12-27: qty 15

## 2015-12-27 MED ORDER — DRONEDARONE HCL 400 MG PO TABS
400.0000 mg | ORAL_TABLET | Freq: Two times a day (BID) | ORAL | Status: DC
  Administered 2015-12-27 – 2015-12-28 (×2): 400 mg via ORAL
  Filled 2015-12-27 (×4): qty 1

## 2015-12-27 MED ORDER — TORSEMIDE 20 MG PO TABS
20.0000 mg | ORAL_TABLET | Freq: Every day | ORAL | Status: DC
  Administered 2015-12-27 – 2015-12-28 (×2): 20 mg via ORAL
  Filled 2015-12-27 (×2): qty 1

## 2015-12-27 MED ORDER — INSULIN ASPART 100 UNIT/ML ~~LOC~~ SOLN
0.0000 [IU] | Freq: Three times a day (TID) | SUBCUTANEOUS | Status: DC
  Administered 2015-12-27: 1 [IU] via SUBCUTANEOUS

## 2015-12-27 MED ORDER — DEXTROSE-NACL 5-0.45 % IV SOLN
INTRAVENOUS | Status: DC
  Administered 2015-12-27: 09:00:00 via INTRAVENOUS

## 2015-12-27 MED ORDER — SPIRONOLACTONE 25 MG PO TABS
25.0000 mg | ORAL_TABLET | Freq: Every day | ORAL | Status: DC
  Administered 2015-12-27 – 2015-12-28 (×2): 25 mg via ORAL
  Filled 2015-12-27 (×2): qty 1

## 2015-12-27 MED ORDER — CETYLPYRIDINIUM CHLORIDE 0.05 % MT LIQD
7.0000 mL | Freq: Two times a day (BID) | OROMUCOSAL | Status: DC
  Administered 2015-12-28 – 2015-12-29 (×3): 7 mL via OROMUCOSAL

## 2015-12-27 NOTE — Progress Notes (Signed)
PULMONARY / CRITICAL CARE MEDICINE   Name: Christian [name] MRN: [id number] DOB: July 15, 1940    ADMISSION DATE:  12/14/2015 CONSULTATION DATE:  12/22/2015  REFERRING MD:  EDP  CHIEF COMPLAINT:  Acute Hypoxic Respiratory Failure  HISTORY OF PRESENT ILLNESS:   Per the electronic medical record patient became increasingly lethargic and "wasn't acting like himself". Patient was unable to hold a fork dinner. Family called EMS. On arrival patient was saturating 84% on room air and drooling. Patient was unresponsive to sternal rub.. Patient was subsequently intubated in the emergency department after arrival and assessment by emergency department physician. Patient's family reports that he was hospitalized in February for aspiration pneumonia. Over the last month they have noticed a gradual decline with increasing fatigue and dyspnea on exertion. Patient had been compliant with his diuretic medications but family do report increasing edema that they know today. The patient has also had his Lopressor adjusted recently as well for persistent bradycardia. At approximately 5 PM patient was noted to have decreased level of consciousness and incontinence but was able to respond. Family reports that after contacting him later in the evening his altered mentation became evident with repeated dropping of the phone. Patient's family reports that they had planned to discuss palliative medicine at appointment with his general practitioner tomorrow.   SUBJECTIVE No distress  VITAL SIGNS: BP 94/68 mmHg  Pulse 93  Temp(Src) 98.5 F (36.9 C) (Oral)  Resp 34  Ht 5\' 10"  (1.778 m)  Wt 209 lb 3.5 oz (94.9 kg)  BMI 30.02 kg/m2  SpO2 98%  HEMODYNAMICS:    VENTILATOR SETTINGS: Vent Mode:  [-] BIPAP FiO2 (%):  [40 %] 40 % Set Rate:  [12 bmp] 12 bmp  INTAKE / OUTPUT: I/O last 3 completed shifts: In: 3328.6 [I.V.:1908.6; IV Piggyback:1420] Out: 1750 [Urine:1750]  PHYSICAL EXAMINATION: General:. No acute  distress. Follows commands, no distress.  Integument:  Warm & dry. No rash on exposed skin. Some dermal exfoliation on his feet bilaterally. HEENT:  No scleral injection or icterus. No JVD Cardiovascular: irreg, + murmur   Pulmonary:  Diminished and coarse breath sounds bilaterally. No accessory use Abdomen: Soft. Normal bowel sounds. Nondistended. Musculoskeletal:  Normal bulk. No joint deformity or effusion appreciated. Neurological:  Awake. Pupils reactive. Withdraws extremities to pain. Tracks and follows Psychiatric:  Unable to assess given intubation and sedation.  LABS:  BMET  Recent Labs Lab 12/25/15 0249 12/26/15 0320 12/27/15 0312  NA 132* 133* 136  K 3.4* 3.9 4.1  CL 91* 98* 101  CO2 29 28 28   BUN 16 14 12   CREATININE 1.13 1.12 0.99  GLUCOSE 380* 98 70    Electrolytes  Recent Labs Lab 12/24/15 0527 12/25/15 0249 12/26/15 0320 12/27/15 0312  CALCIUM 7.4* 7.5* 7.8* 8.1*  MG 1.7 1.7  --   --   PHOS <1.0* 1.8*  --   --     CBC  Recent Labs Lab 12/24/15 0527 12/25/15 0249 12/27/15 0312  WBC 8.4 7.7 7.4  HGB 10.0* 9.8* 12.1*  HCT 35.0* 33.2* 40.4  PLT 131* 142* 153    Coag's  Recent Labs Lab 12/24/15 0527  APTT 45*  INR 4.80*    Sepsis Markers  Recent Labs Lab 01/05/2016 2251 12/24/15 0105 12/25/15 0249 12/26/15 0320  LATICACIDVEN 0.81  --   --   --   PROCALCITON  --  <0.10 <0.10 <0.10    ABG  Recent Labs Lab 12/27/2015 2338 12/24/15 0415  PHART 7.520* 7.557*  PCO2ART 48.8* 40.8  PO2ART 510.0* 224*    Liver Enzymes  Recent Labs Lab 01/07/2016 2230 12/24/15 0527  AST 29  --   ALT 19  --   ALKPHOS 80  --   BILITOT 0.9  --   ALBUMIN 3.9 2.2*    Cardiac Enzymes  Recent Labs Lab 12/24/15 0527 12/24/15 1410  TROPONINI 0.08* 0.14*    Glucose  Recent Labs Lab 12/27/15 0325 12/27/15 0419 12/27/15 0801 12/27/15 0903 12/27/15 0959 12/27/15 1213  GLUCAP 58* 72 62* 68 81 90    Imaging No results  found.   STUDIES:  PORT CXR 6/14:  Endotracheal tube in acceptable position. Low lung volumes. Blunting of left costophrenic angle & questionable right midlung opacity. CT HEAD W/O 6/15 >>neg TTE 6/15 >>  MICROBIOLOGY: Blood Ctx x2 6/14 >> Urine Ctx 6/14 >>neg MRSA PCR 6/14 >> Tracheal Asp Ctx 6/14 >> neg   ANTIBIOTICS: Cefepime 6/14 >> Vancomycin 6/14 >>  SIGNIFICANT EVENTS: 6/14 - Admit  LINES/TUBES: OETT 7.5 6/14 >> Foley 6/14 >> PIV x3  DISCUSSION:   75 year old male with acute on chronic hypoxic and hypercarbic respiratory failure. Unclear etiology as patient's BNP does not appear above baseline. Lengthy discussion with the patient's family at bedside regarding goals of care. Extubated 6/18 doing OK, no distress. Plan today is to resume home meds, transition to his home BIPAP and hope for the best. I have asked him to reconsider home hospice.   ASSESSMENT / PLAN:  PULMONARY A: Acute on Chronic Hypoxic Respiratory Failure - Baseline 2 L/m requirement. Chronic Hypercarbic Respiratory Failure - Previously followed with [facility]. On nocturnal NIPPV. Possible Aspiration Pneumonia  P:   PRN and HS bipap w/ his device  Will get an abg in am-->see if we can make adjustments to his home vent.  May transition to comfort.   CARDIOVASCULAR A:  H/O Chronic Diastolic CHF H/O Hyperlipidemia H/O HTN H/O Chronic A fib - On Xarelto. H/O CAD - S/P 3 Vessel CABG 2009.  P:  Telemetry Monitoring Dc amio  Hold xarelto  resume , Aldactone, Demadex, & Multaq SQ heparin IV lopressor for hr >110   RENAL  A:   H/O CKD Stage III  P:   Trending UOP Daily electrolytes & renal function Replacing electrolytes as indicated  GASTROINTESTINAL A:   No acute issues.  P:   NPO Pepcid IV daily  HEMATOLOGIC A:   No acute issues. Systemic Anticoagulation - With Xarelto.  P:  Trending cell counts daily SCDs Holding Xarelto as he is coagulopathic and not sure he is a good long  term candidate   INFECTIOUS A:   Possible Aspiration Pneumonia  P:   Empiric Vancomycin & Cefepime Day #4-->DC as neg PCT and cultures   ENDOCRINE Hypoglycemia A:   H/O DM Type II  P:   Accu-Checks q4hr Low dose SSI per algorithm Checking Hgb A1c  NEUROLOGIC A:   Acute Encephalopathy , more awake 6/16-->resolved H/O Hepatic Encephalopathy  P:   Limit sedation   FAMILY   Erick Colace ACNP-BC [place] Pager # [phone] OR # [phone] if no answer  12/27/2015, 2:23 PM  Attending Note:  75 year old male with CHF presenting with aspiration and respiratory failure. On exam, patient is weaning well and lungs with bibasilar crackles. I reviewed CXR myself, pulmonary edema.  Discussed with PCCM-NP and bedside RN.  Acute on chronic respiratory failure:  - BiPAP at night to facilitate being awake during the day.  -  Continue monitoring.  - ABG in AM to titrate BiPAP pressures.  Physical deconditioning:  - PT efforts.  - Nutrition consult.  Hepatic encephalopathy:  - Lactulose.  - Check ammonia.  A-fib:  - NOAC  - Tele monitoring.  - D/C amiodarone  Will keep in the ICU overnight then likely transfer to SDU in AM pending his functional status.  Patient seen and examined, agree with above note.  I dictated the care and orders written for this patient under my direction.  Rush Farmer, M.D. [facility] Pulmonary/Critical Care Medicine. Pager: [phone]. After hours pager: [phone].

## 2015-12-27 NOTE — Progress Notes (Signed)
Daily Progress Note   Patient Name: Christian [name]       Date: 12/27/2015 DOB: 09-18-1940  Age: 75 y.o. MRN#: [id number] Attending Physician: Rush Farmer, MD Primary Care Physician: [name], MD Admit Date: 01/04/2016  Reason for Consultation/Follow-up: Establishing goals of care, Hospice Evaluation, Non pain symptom management, Pain control and Psychosocial/spiritual support  Subjective: No acute events overnight. Patient was hypoglycemic and was able to drink orange juice. He tells me he feels terrible today but cannot specify. He is exhibiting 3-5 word dyspnea as well as pursed lip breathing. I asked him if he was short of breath and he stated "a little bit". Met with patient as well as with his son and daughter to discuss hospice care when medically ready for discharge. Patient has a very negative view of hospice, and states "I'm not ready to quit". He is able to articulate that he doesn't want to be intubated again would not want a feeding tube and is also able to talk about funeral arrangements but nothing in between. He is still holding onto the thought that he will be strong enough to go home to his apartment and live alone if he has portable oxygen equipment available to him. Myself as well as his children were attempting to speak to him about our concerns of living alone given his weakened condition and severity of his cardiopulmonary disease. As noted patient is visibly short of breath at rest.  Length of Stay: 3  Current Medications: Scheduled Meds:  . antiseptic oral rinse  7 mL Mouth Rinse QID  . chlorhexidine gluconate (SAGE KIT)  15 mL Mouth Rinse BID  . famotidine (PEPCID) IV  20 mg Intravenous Q12H  . heparin subcutaneous  5,000 Units Subcutaneous Q8H  . insulin aspart   0-9 Units Subcutaneous Q4H  . metoprolol  5 mg Intravenous Q6H    Continuous Infusions: . sodium chloride Stopped (12/27/15 0912)  . amiodarone 30 mg/hr (12/27/15 1103)  . dextrose 5 % and 0.45% NaCl 75 mL/hr at 12/27/15 0912    PRN Meds: sodium chloride, morphine injection, ondansetron  Physical Exam  Constitutional: He is oriented to person, place, and time. He appears well-developed.  HENT:  Head: Normocephalic and atraumatic.  Cardiovascular:  irrg  Pulmonary/Chest:  Increased work of breathing. 3-5 word dyspnea; pursed lipped  breathing  Neurological: He is alert and oriented to person, place, and time.  Concentration poor  Skin: Skin is warm.  Psychiatric:  Anxious with end of life discussion  Nursing note and vitals reviewed.           Vital Signs: BP 109/67 mmHg  Pulse 110  Temp(Src) 98.5 F (36.9 C) (Oral)  Resp 30  Ht 5' 10"  (1.778 m)  Wt 94.9 kg (209 lb 3.5 oz)  BMI 30.02 kg/m2  SpO2 97% SpO2: SpO2: 97 % O2 Device: O2 Device: Nasal Cannula O2 Flow Rate: O2 Flow Rate (L/min): 4 L/min  Intake/output summary:  Intake/Output Summary (Last 24 hours) at 12/27/15 1218 Last data filed at 12/27/15 1103  Gross per 24 hour  Intake 2420.21 ml  Output    900 ml  Net 1520.21 ml   LBM: Last BM Date:  (unknown) Baseline Weight: Weight: 90.719 kg (200 lb) Most recent weight: Weight: 94.9 kg (209 lb 3.5 oz)       Palliative Assessment/Data:    Flowsheet Rows        Most Recent Value   Intake Tab    Referral Department  Critical care   Unit at Time of Referral  ICU   Palliative Care Primary Diagnosis  Cardiac   Date Notified  12/26/15   Palliative Care Type  New Palliative care   Reason for referral  Clarify Goals of Care, Counsel Regarding Hospice   Date of Admission  12/17/2015   Date first seen by Palliative Care  12/26/15   # of days Palliative referral response time  0 Day(s)   # of days IP prior to Palliative referral  3   Clinical Assessment     Palliative Performance Scale Score  30%   Pain Max last 24 hours  Not able to report   Pain Min Last 24 hours  Not able to report   Dyspnea Max Last 24 Hours  Not able to report   Dyspnea Min Last 24 hours  Not able to report   Nausea Max Last 24 Hours  Not able to report   Nausea Min Last 24 Hours  Not able to report   Anxiety Max Last 24 Hours  Not able to report   Anxiety Min Last 24 Hours  Not able to report   Other Max Last 24 Hours  Not able to report   Psychosocial & Spiritual Assessment    Palliative Care Outcomes    Who was at the meeting?  son and dtr   Palliative Care Outcomes  Improved pain interventions, Improved non-pain symptom therapy, Clarified goals of care, Counseled regarding hospice, Transitioned to hospice   Patient/Family wishes: Interventions discontinued/not started   Mechanical Ventilation, Trach, NIPPV, Hemodialysis, PEG, Vasopressors, Tube feedings/TPN   Palliative Care follow-up planned  Yes, Facility      Patient Active Problem List   Diagnosis Date Noted  . HCAP (healthcare-associated pneumonia)   . Palliative care encounter   . Acute on chronic respiratory failure with hypoxia ([place]) 12/24/2015  . Hypoxia   . Respiratory failure ([place])   . Hypertensive heart disease   . Type II diabetes mellitus ([place])   . Acute respiratory failure with hypoxia ([place])   . Atrial fibrillation with rapid ventricular response ([place])   . CAP (community acquired pneumonia)   . Sepsis ([place])   . Acute on chronic respiratory failure with hypoxia and hypercapnia ([facility])   . Pneumonia 10/22/2015  . Medicare annual wellness visit, initial  05/21/2015  . Advance care planning 05/21/2015  . 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 Fatigue 11/21/2014  . AK (actinic keratosis) 11/03/2014  . Benign paroxysmal positional vertigo 01/16/2014  . Orthostatic hypotension 04/03/2013  . 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 Anemia 06/19/2012  . Chronic diastolic heart failure ([place])  05/11/2012  . Neck pain 01/30/2012  . Chronic respiratory failure ([place]) 01/17/2012  . Leg weakness 01/17/2012  . Hip pain 10/17/2011  . Ingrown toenail 10/17/2011  . Hypercarbia 10/14/2011  . Esophagitis 09/11/2011  . Dysphagia 07/21/2011  . Diastolic CHF, acute ([place]) 06/28/2011  . Weight loss, non-intentional 06/28/2011  . Mental confusion 06/28/2011  . Back pain 12/07/2010  . Hyperglycemia 12/07/2010  . LEFT VENTRICULAR FAILURE 05/19/2010  . EDEMA 04/12/2010  . SHORTNESS OF BREATH 03/18/2010  . Hyperlipidemia 03/03/2010  . HYPERTENSION, BENIGN 03/03/2010  . CAD (coronary artery disease) of artery bypass graft 03/03/2010  . ATRIAL FIBRILLATION 03/03/2010    Palliative Care Assessment & Plan   Patient Profile: Patient is a 75 year old male with acute on chronic hypoxic and hypercarbic respiratory failure, diastolic heart failure hyperlipidemia hypertension chronic atrial fibrillation coronary artery disease status post three-vessel CABG in 2009 as well as recurrent aspiration pneumonia. He has had a sharp functional decline since April of this year with minimal by mouth intake, no exercise tolerance.  Assessment: Patient at this point clearly is not at a point of wanting to move forward with hospice inpatient as his children had hoped. He is talking about wanting to return home and at this point is not receptive to hospice coming into the home.  Recommendations/Plan:  Would recommend continuing with physical therapy evaluation as well as speech therapy evaluation to illustrate the patient and family where patient is at this point after recently being intubated.  At this point and less patient changes his mind about further hospice support disposition is looking like SNF with potential rehabilitation, unless family can piece together more support in the home which at this point does not seem like a possibility  Palliative medicine team provider to meet with patient and family  tomorrow morning at 10 AM to complete a most form. We were unable to finish this today he comes of weakness and dyspnea. Perhaps this will be another opportunity to address disposition options when medically ready for discharge  Goals of Care and Additional Recommendations:  Limitations on Scope of Treatment: No Artificial Feeding, No Hemodialysis, No Surgical Procedures and No Tracheostomy  Code Status:    Code Status Orders        Start     Ordered   12/26/15 1353  Do not attempt resuscitation (DNR)   Continuous     12/26/15 1352    Code Status History    Date Active Date Inactive Code Status Order ID Comments User Context   12/24/2015 12:46 AM 12/26/2015  1:52 PM Partial Code [id number]  Javier Glazier, MD ED   10/22/2015  3:46 PM 10/27/2015  5:27 PM Full Code [id number]  Henreitta Leber, MD Inpatient    Advance Directive Documentation        Most Recent Value   Type of Advance Directive  Living will, Healthcare Power of Attorney   Pre-existing out of facility DNR order (yellow form or pink MOST form)     "MOST" Form in Place?         Prognosis:   < 4 weeks . Still feel the patient could have a very limited prognosis in light of  his recent acute on chronic hypoxic/hypercarbic respiratory failure and requirements of intubation, 2 episodes of asp pna, sharp functional decline since 4/17, comorbidities of coronary artery disease, chronic atrial fibrillation, diabetes, hyperlipidemia, as well as hypoventilation syndrome. He is visibly very short of breath at rest.  Discharge Planning:  To Be Determined   Thank you for allowing the Palliative Medicine Team to assist in the care of this patient.   Time In: 1000 Time Out: 1100 Total Time 60 min Prolonged Time Billed  no       Greater than 50%  of this time was spent counseling and coordinating care related to the above assessment and plan.  Dory Horn, NP  Please contact Palliative Medicine Team phone at [phone]  for questions and concerns.

## 2015-12-27 NOTE — Progress Notes (Signed)
Pt. Using his own bipap and mask from home.

## 2015-12-27 NOTE — Progress Notes (Signed)
Patient's HR in 130s since beginning of shift. Spoke with Melvyn Novas, MD. No new orders, patient to be placed on home CPAP HS.

## 2015-12-27 NOTE — Progress Notes (Signed)
Pt has home bipap/vent machine, NP request pt use his home machine tonight.   I inspected the power cord to ensure cord not frayed, and I also put a page/request for biomed to inspect machine.

## 2015-12-28 DIAGNOSIS — Z7189 Other specified counseling: Secondary | ICD-10-CM | POA: Insufficient documentation

## 2015-12-28 DIAGNOSIS — R0600 Dyspnea, unspecified: Secondary | ICD-10-CM | POA: Insufficient documentation

## 2015-12-28 LAB — POCT I-STAT 3, ART BLOOD GAS (G3+)
Bicarbonate: 31.5 mEq/L — ABNORMAL HIGH (ref 20.0–24.0)
O2 SAT: 89 %
PCO2 ART: 86 mmHg — AB (ref 35.0–45.0)
PH ART: 7.169 — AB (ref 7.350–7.450)
Patient temperature: 97.9
TCO2: 34 mmol/L (ref 0–100)
pO2, Arterial: 72 mmHg — ABNORMAL LOW (ref 80.0–100.0)

## 2015-12-28 LAB — GLUCOSE, CAPILLARY
Glucose-Capillary: 85 mg/dL (ref 65–99)
Glucose-Capillary: 97 mg/dL (ref 65–99)
Glucose-Capillary: 89 mg/dL (ref 65–99)

## 2015-12-28 MED ORDER — GLYCOPYRROLATE 0.2 MG/ML IJ SOLN
0.1000 mg | Freq: Four times a day (QID) | INTRAMUSCULAR | Status: DC | PRN
  Filled 2015-12-28: qty 0.5

## 2015-12-28 MED ORDER — GLYCOPYRROLATE 0.2 MG/ML IJ SOLN
0.2000 mg | Freq: Once | INTRAMUSCULAR | Status: AC
  Administered 2015-12-28: 0.2 mg via INTRAVENOUS
  Filled 2015-12-28: qty 1

## 2015-12-28 MED ORDER — DIPHENHYDRAMINE HCL 50 MG/ML IJ SOLN: 12.5000 mg | INTRAMUSCULAR | Status: DC | PRN

## 2015-12-28 MED ORDER — HALOPERIDOL LACTATE 2 MG/ML PO CONC
0.5000 mg | ORAL | Status: DC | PRN
  Filled 2015-12-28: qty 0.3

## 2015-12-28 MED ORDER — HALOPERIDOL 1 MG PO TABS
0.5000 mg | ORAL_TABLET | ORAL | Status: DC | PRN
Start: 2015-12-28 — End: 2015-12-29

## 2015-12-28 MED ORDER — GLYCOPYRROLATE 0.2 MG/ML IJ SOLN
0.2000 mg | INTRAMUSCULAR | Status: DC | PRN
Start: 2015-12-28 — End: 2015-12-29

## 2015-12-28 MED ORDER — ONDANSETRON HCL 4 MG/2ML IJ SOLN
4.0000 mg | Freq: Four times a day (QID) | INTRAMUSCULAR | Status: DC | PRN
  Administered 2015-12-28: 4 mg via INTRAVENOUS
  Filled 2015-12-28: qty 2

## 2015-12-28 MED ORDER — ONDANSETRON 4 MG PO TBDP: 4.0000 mg | ORAL_TABLET | Freq: Four times a day (QID) | ORAL | Status: DC | PRN

## 2015-12-28 MED ORDER — GLYCOPYRROLATE 1 MG PO TABS
1.0000 mg | ORAL_TABLET | ORAL | Status: DC | PRN
Start: 2015-12-28 — End: 2015-12-29
  Filled 2015-12-28: qty 1

## 2015-12-28 MED ORDER — MORPHINE SULFATE 25 MG/ML IV SOLN
1.0000 mg/h | INTRAVENOUS | Status: DC
  Administered 2015-12-28: 1 mg/h via INTRAVENOUS
  Filled 2015-12-28: qty 10

## 2015-12-28 MED ORDER — MORPHINE BOLUS VIA INFUSION
2.0000 mg | INTRAVENOUS | Status: DC | PRN
  Administered 2015-12-29: 2 mg via INTRAVENOUS
  Filled 2015-12-28 (×2): qty 2

## 2015-12-28 MED ORDER — HALOPERIDOL LACTATE 5 MG/ML IJ SOLN: 0.5000 mg | INTRAMUSCULAR | Status: DC | PRN

## 2015-12-28 MED ORDER — MORPHINE SULFATE (PF) 2 MG/ML IV SOLN
1.0000 mg | INTRAVENOUS | Status: DC | PRN
  Administered 2015-12-28: 2 mg via INTRAVENOUS
  Administered 2015-12-28: 1 mg via INTRAVENOUS
  Filled 2015-12-28 (×2): qty 1

## 2015-12-28 MED ORDER — BIOTENE DRY MOUTH MT LIQD: 15.0000 mL | OROMUCOSAL | Status: DC | PRN

## 2015-12-28 MED ORDER — POLYVINYL ALCOHOL 1.4 % OP SOLN
1.0000 [drp] | Freq: Four times a day (QID) | OPHTHALMIC | Status: DC | PRN
  Filled 2015-12-28: qty 15

## 2015-12-28 MED ORDER — GLYCOPYRROLATE 0.2 MG/ML IJ SOLN: 0.2000 mg | INTRAMUSCULAR | Status: DC | PRN

## 2015-12-28 NOTE — Progress Notes (Signed)
Placed pt. Back on our vent for bipap per MD order.

## 2015-12-28 NOTE — Clinical Social Work Note (Addendum)
CSW received referral for hospice facility placement.  CSW reviewed patient's record and family would like [facility].  CSW contacted [facility], who said patient will be added to the wait list, there is currently one person ahead of him.  Hospice home will review patient's information and determine if there is bed availability and if patient meets eligibility criteria.  CSW to await call back from [facility] and pending patient's progress.  Jones Broom. [place], MSW, [place] 12/28/2015 4:40 PM

## 2015-12-28 NOTE — Progress Notes (Signed)
CRITICAL VALUE ALERT  Critical value received:  ABG pH 7.169 Co2 86  Date of notification:  12/28/15  Time of notification:  0345   Critical value read back:Yes.    Nurse who received alert:  Vic Blackbird RN  MD notified (1st page):  eLink MD  Time of first page:  0350  Responding MD:  Warren Lacy MD  Time MD responded:  [phone]

## 2015-12-28 NOTE — Progress Notes (Signed)
   12/28/15 1400  Clinical Encounter Type  Visited With Patient and family together  Visit Type Follow-up  Referral From Nurse  Consult/Referral To [facility] responded to consult for pt.  Spiritual care is not able to notarize a durable POA.  Chaplain advised pt's family that the only form that could be notarized was an AD from the hospital.  Vilinda Blanks [facility] 12/28/2015 2:27 PM

## 2015-12-28 NOTE — Progress Notes (Signed)
PULMONARY / CRITICAL CARE MEDICINE   Name: Christian [name] MRN: [id number] DOB: 1940/09/19    ADMISSION DATE:  01/04/2016 CONSULTATION DATE:  01/03/2016  REFERRING MD:  EDP  CHIEF COMPLAINT:  Acute Hypoxic Respiratory Failure  HISTORY OF PRESENT ILLNESS:   Per the electronic medical record patient became increasingly lethargic and "wasn't acting like himself". Patient was unable to hold a fork dinner. Family called EMS. On arrival patient was saturating 84% on room air and drooling. Patient was unresponsive to sternal rub.. Patient was subsequently intubated in the emergency department after arrival and assessment by emergency department physician. Patient's family reports that he was hospitalized in February for aspiration pneumonia. Over the last month they have noticed a gradual decline with increasing fatigue and dyspnea on exertion. Patient had been compliant with his diuretic medications but family do report increasing edema that they know today. The patient has also had his Lopressor adjusted recently as well for persistent bradycardia. At approximately 5 PM patient was noted to have decreased level of consciousness and incontinence but was able to respond. Family reports that after contacting him later in the evening his altered mentation became evident with repeated dropping of the phone. Patient's family reports that they had planned to discuss palliative medicine at appointment with his general practitioner tomorrow.   SUBJECTIVE No distress but mildly confused.  VITAL SIGNS: BP 107/60 mmHg  Pulse 102  Temp(Src) 98.9 F (37.2 C) (Oral)  Resp 26  Ht 5\' 10"  (1.778 m)  Wt 95.1 kg (209 lb 10.5 oz)  BMI 30.08 kg/m2  SpO2 96%  HEMODYNAMICS:    VENTILATOR SETTINGS: Vent Mode:  [-] BIPAP FiO2 (%):  [40 %] 40 % Set Rate:  [12 bmp] 12 bmp  INTAKE / OUTPUT: I/O last 3 completed shifts: In: 2430.6 [P.O.:240; I.V.:2090.6; IV Piggyback:100] Out: 2720 [Urine:2720]  PHYSICAL  EXAMINATION: General:. No acute distress. Follows commands, no distress. On BiPAP. Integument:  Warm & dry. No rash on exposed skin. Some dermal exfoliation on his feet bilaterally. HEENT:  No scleral injection or icterus. No JVD Cardiovascular: irreg, + murmur   Pulmonary:  Diminished and coarse breath sounds bilaterally. No accessory use Abdomen: Soft. Normal bowel sounds. Nondistended. Musculoskeletal:  Normal bulk. No joint deformity or effusion appreciated. Neurological:  Awake. Pupils reactive. Withdraws extremities to pain. Tracks and follows Psychiatric:  Unable to assess given intubation and sedation.  LABS:  BMET  Recent Labs Lab 12/25/15 0249 12/26/15 0320 12/27/15 0312  NA 132* 133* 136  K 3.4* 3.9 4.1  CL 91* 98* 101  CO2 29 28 28   BUN 16 14 12   CREATININE 1.13 1.12 0.99  GLUCOSE 380* 98 70    Electrolytes  Recent Labs Lab 12/24/15 0527 12/25/15 0249 12/26/15 0320 12/27/15 0312  CALCIUM 7.4* 7.5* 7.8* 8.1*  MG 1.7 1.7  --   --   PHOS <1.0* 1.8*  --   --     CBC  Recent Labs Lab 12/24/15 0527 12/25/15 0249 12/27/15 0312  WBC 8.4 7.7 7.4  HGB 10.0* 9.8* 12.1*  HCT 35.0* 33.2* 40.4  PLT 131* 142* 153    Coag's  Recent Labs Lab 12/24/15 0527  APTT 45*  INR 4.80*    Sepsis Markers  Recent Labs Lab 12/22/2015 2251 12/24/15 0105 12/25/15 0249 12/26/15 0320  LATICACIDVEN 0.81  --   --   --   PROCALCITON  --  <0.10 <0.10 <0.10    ABG  Recent Labs Lab 01/05/2016 2338 12/24/15 0415  12/28/15 0340  PHART 7.520* 7.557* 7.169*  PCO2ART 48.8* 40.8 86.0*  PO2ART 510.0* 224* 72.0*    Liver Enzymes  Recent Labs Lab 12/17/2015 2230 12/24/15 0527  AST 29  --   ALT 19  --   ALKPHOS 80  --   BILITOT 0.9  --   ALBUMIN 3.9 2.2*    Cardiac Enzymes  Recent Labs Lab 12/24/15 0527 12/24/15 1410  TROPONINI 0.08* 0.14*    Glucose  Recent Labs Lab 12/27/15 0903 12/27/15 0959 12/27/15 1213 12/27/15 1633 12/27/15 2007  12/27/15 2157  GLUCAP 68 81 90 118* 146* 146*    Imaging No results found.   STUDIES:  PORT CXR 6/14:  Endotracheal tube in acceptable position. Low lung volumes. Blunting of left costophrenic angle & questionable right midlung opacity. CT HEAD W/O 6/15 >>neg TTE 6/15 >>  MICROBIOLOGY: Blood Ctx x2 6/14 >> Urine Ctx 6/14 >>neg MRSA PCR 6/14 >> Tracheal Asp Ctx 6/14 >> neg   ANTIBIOTICS: Cefepime 6/14 >> Vancomycin 6/14 >>  SIGNIFICANT EVENTS: 6/14 - Admit  LINES/TUBES: OETT 7.5 6/14 >> Foley 6/14 >> PIV x3  DISCUSSION:   75 year old male with acute on chronic hypoxic and hypercarbic respiratory failure. Unclear etiology as patient's BNP does not appear above baseline. Lengthy discussion with the patient's family at bedside regarding goals of care. Extubated 6/18 doing OK, no distress. Plan today is to resume home meds, transition to his home BIPAP and hope for the best. I have asked him to reconsider home hospice.   ASSESSMENT / PLAN:  PULMONARY A: Acute on Chronic Hypoxic Respiratory Failure - Baseline 2 L/m requirement. Chronic Hypercarbic Respiratory Failure - Previously followed with [place]. On nocturnal NIPPV. Possible Aspiration Pneumonia  P:   D/C BiPAP Supplemental O2 for comfort.  CARDIOVASCULAR A:  H/O Chronic Diastolic CHF H/O Hyperlipidemia H/O HTN H/O Chronic A fib - On Xarelto. H/O CAD - S/P 3 Vessel CABG 2009.  P:  D/C tele monitoring.  RENAL  A:   H/O CKD Stage III  P:   D/C blood draws.  GASTROINTESTINAL A:   No acute issues.  P:   Liberalize diet for comfort.  HEMATOLOGIC A:   No acute issues. Systemic Anticoagulation - With Xarelto.  P:  D/C anti-coagulation.  INFECTIOUS A:   Possible Aspiration Pneumonia  P:   D/C abx.  ENDOCRINE Hypoglycemia A:   H/O DM Type II  P:   D/C CBGs and ISS.  NEUROLOGIC A:   Acute Encephalopathy , more awake 6/16-->resolved H/O Hepatic Encephalopathy  P:   Change focus to  comfort. Morphine and robinul per palliative.  FAMILY  Family all present bedside, spoke with them and with Imogene Burn from palliative.  They all feel that the patient has had enough over the past 5 years and he is in a place where he would just rather be comfortable.  Plan is to d/c BiPAP and transfer patient to 5N.  If all goes well overnight then to [place] [facility] in AM.  The patient is critically ill with multiple organ systems failure and requires high complexity decision making for assessment and support, frequent evaluation and titration of therapies, application of advanced monitoring technologies and extensive interpretation of multiple databases.   Critical Care Time devoted to patient care services described in this note is  35  Minutes. This time reflects time of care of this signee Dr Jennet Maduro. This critical care time does not reflect procedure time, or teaching time or supervisory time of  PA/NP/Med student/Med Resident etc but could involve care discussion time.  Rush Farmer, M.D. [facility] Pulmonary/Critical Care Medicine. Pager: [phone]. After hours pager: [phone].

## 2015-12-28 NOTE — Progress Notes (Signed)
   12/28/15 1300  Clinical Encounter Type  Visited With Patient;Patient and family together  Visit Type Follow-up;Spiritual support;Social support  Referral From Nurse  Consult/Referral To Chaplain  Spiritual Encounters  Spiritual Needs Emotional  Stress Factors  Patient Stress Factors Health changes;Loss of control;Major life changes  Family Stress Factors Health changes;Loss of control;Major life changes   Chaplain responded to consult for pt.  Pt able to speak and acknowledge presence.  Family was friendly and happy that pt was being moved from ICU to 6th floor. Chaplain offered ministry of presence to family.  Vilinda Blanks Shawntelle Ungar 12/28/2015 1:27 PM

## 2015-12-28 NOTE — Progress Notes (Signed)
Report given to [employer] on unit [place].

## 2015-12-28 NOTE — Progress Notes (Signed)
Daily Progress Note   Patient Name: Christian [name]       Date: 12/28/2015 DOB: [birth year]  Age: 75 y.o. MRN#: [id number] Attending Physician: Christian Farmer, MD Primary Care Physician: Christian Stain, MD Admit Date: 12/25/2015  Reason for Consultation/Follow-up: Establishing goals of care, Hospice Evaluation, Non pain symptom management, Psychosocial/spiritual support and Withdrawal of life-sustaining treatment  Subjective: Patient reports he is breathing comfortably.  He states he wants to follow his children's wishes and make them happy, however, his is also a Nurse, adult and will continue to fight.  I met with patient, daughter Christian [name]) and son Christian [name]) as well as ex-wife Christian [name]) in ICU.  Patient is currently on bipap. Daughter describes decline over the past two months as well as recurrent hospitalizations and health issues over the past 5 years.   Son reports that he does not want to lose his father, but he feels that we are doing things "to him" rather than doing things for him. Christian [name] is a strong independent man - who was at home in his apartment prior to this hospitalization.  His son reports they have exhausted all of their resources over the past 5 years to keep him independent.   Both children sadly admit that they feel Hospice is the right choice for him at this point.  [name], Christian [name] is very supportive of both the patient and their children.  She states the children need medical professional to assure them that they are making the right decision.  The plan will be to wean him comfortably from bipap today.  Offer PRN morphine as needed.  Transfer to 6N to see if he stabilizes over night.  If so, would recommend transfer to [facility].  Will decrease medications to focus on  comfort.  Regular diet (family is aware of aspiration risk).    Length of Stay: 4  Current Medications: Scheduled Meds:  . antiseptic oral rinse  7 mL Mouth Rinse q12n4p  . chlorhexidine  15 mL Mouth Rinse BID  . dronedarone  400 mg Oral BID WC  . glycopyrrolate  0.2 mg Intravenous Once  . spironolactone  25 mg Oral Daily  . torsemide  20 mg Oral Daily    Continuous Infusions: . sodium chloride 0 mL/hr at 12/27/15 1438  . dextrose 5 % and 0.45%  NaCl 10 mL/hr at 12/27/15 1438    PRN Meds: sodium chloride, morphine injection  Physical Exam   Wd, Wn chronically ill appearing male on bipap.   CV tachycardic Resp currently on bipap Abdomen:  Soft +bs Extremities:  Cool LE with 3+ edema bilaterally        Vital Signs: BP 102/57 mmHg  Pulse 99  Temp(Src) 98.9 F (37.2 C) (Oral)  Resp 9  Ht _0  (1.778 m)  Wt 95.1 kg (209 lb 10.5 oz)  BMI 30.08 kg/m2  SpO2 100% SpO2: SpO2: 100 % O2 Device: O2 Device: Bi-PAP O2 Flow Rate: O2 Flow Rate (L/min): 6 L/min  Intake/output summary:  Intake/Output Summary (Last 24 hours) at 12/28/15 1052 Last data filed at 12/28/15 0700  Gross per 24 hour  Intake 838.56 ml  Output   2050 ml  Net -1211.44 ml   LBM: Last BM Date:  (unknown) Baseline Weight: Weight: 90.719 kg (200 lb) Most recent weight: Weight: 95.1 kg (209 lb 10.5 oz)       Palliative Assessment/Data:    Flowsheet Rows        Most Recent Value   Intake Tab    Referral Department  Critical care   Unit at Time of Referral  ICU   Palliative Care Primary Diagnosis  Cardiac   Date Notified  12/26/15   Palliative Care Type  New Palliative care   Reason for referral  Clarify Goals of Care, Counsel Regarding Hospice   Date of Admission  12/18/2015   Date first seen by Palliative Care  12/26/15   # of days Palliative referral response time  0 Day(s)   # of days IP prior to Palliative referral  3   Clinical Assessment    Palliative Performance Scale Score  30%   Pain Max  last 24 hours  Not able to report   Pain Min Last 24 hours  Not able to report   Dyspnea Max Last 24 Hours  Not able to report   Dyspnea Min Last 24 hours  Not able to report   Nausea Max Last 24 Hours  Not able to report   Nausea Min Last 24 Hours  Not able to report   Anxiety Max Last 24 Hours  Not able to report   Anxiety Min Last 24 Hours  Not able to report   Other Max Last 24 Hours  Not able to report   Psychosocial & Spiritual Assessment    Palliative Care Outcomes    Who was at the meeting?  son and dtr   Palliative Care Outcomes  Improved pain interventions, Improved non-pain symptom therapy, Clarified goals of care, Counseled regarding hospice, Transitioned to hospice   Patient/Family wishes: Interventions discontinued/not started   Mechanical Ventilation, Trach, NIPPV, Hemodialysis, PEG, Vasopressors, Tube feedings/TPN   Palliative Care follow-up planned  Yes, Facility      Patient Active Problem List   Diagnosis Date Noted  . HCAP (healthcare-associated pneumonia)   . Palliative care encounter   . Physical deconditioning   . Acute on chronic respiratory failure with hypoxia ([place]) 12/24/2015  . Hypoxia   . Respiratory failure ([place])   . Hypertensive heart disease   . Type II diabetes mellitus ([place])   . Acute respiratory failure with hypoxia ([place])   . Atrial fibrillation with rapid ventricular response ([place])   . CAP (community acquired pneumonia)   . Sepsis ([place])   . Acute on chronic respiratory failure with hypoxia and hypercapnia ([facility])   .  Pneumonia 10/22/2015  . Medicare annual wellness visit, initial 05/21/2015  . Advance care planning 05/21/2015  . 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 Fatigue 11/21/2014  . AK (actinic keratosis) 11/03/2014  . Benign paroxysmal positional vertigo 01/16/2014  . Orthostatic hypotension 04/03/2013  . 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 Anemia 06/19/2012  . Chronic diastolic heart failure ([place]) 05/11/2012  . Neck pain  01/30/2012  . Chronic respiratory failure ([place]) 01/17/2012  . Leg weakness 01/17/2012  . Hip pain 10/17/2011  . Ingrown toenail 10/17/2011  . Hypercarbia 10/14/2011  . Esophagitis 09/11/2011  . Dysphagia 07/21/2011  . Diastolic CHF, acute ([place]) 06/28/2011  . Weight loss, non-intentional 06/28/2011  . Mental confusion 06/28/2011  . Back pain 12/07/2010  . Hyperglycemia 12/07/2010  . LEFT VENTRICULAR FAILURE 05/19/2010  . EDEMA 04/12/2010  . SHORTNESS OF BREATH 03/18/2010  . Hyperlipidemia 03/03/2010  . HYPERTENSION, BENIGN 03/03/2010  . CAD (coronary artery disease) of artery bypass graft 03/03/2010  . ATRIAL FIBRILLATION 03/03/2010    Palliative Care Assessment & Plan   Patient Profile:  75 y.o. male with past medical history of Coronary artery disease, CABG, diabetes, hyperlipidemia, diastolic heart failure, atrial fibrillation with RVR, hepatic encephalopathy, hypoventilation syndrome on BiPAP at home, history of CO2 retention, history of aspiration pneumonia admitted on 01/03/2016 with decreased level of consciousness, incontinence, and altered mental status. Upon arrival to the emergency room he was saturating at 84% on room air and was drooling. He was unresponsive to a sternal rub. Subsequently he was intubated in the emergency department. Per family patient has been declining since his admission in April for aspiration pneumonia. Patient lives alone and has been requiring more help in the house. They've noted that he is too weak to even fix coffee, often times is not leaving the bed, and has only been eating bites and sips since that time.. Patient was extubated 12/26/2015. While intubated he has required Precedex for agitation Kaylyn Lim amiodarone for chronic atrial fib. Husband if heart and amiodarone was stopped as well as hyp0rtensive. Family shares prior to being admitted to the hospital they had planned to meet with his primary care provider to discuss advance directives given  his sharp decline since April. Patient was admitted with acute on chronic hypoxic as well as hypercarbic respiratory failure and possible aspiration pneumonia   Assessment: Mr. Sebesta declined within 24 hours of extubation back to bipap.  He is mildly confused.  His children are HCPOA.  They have decided that they do not want to hasten his death, but they do want the comfort focus and excellent care that Hospice will provide.  Recommendations/Plan:  Wean from Bi-Pap and do not restart.  Morphine PRN for dyspnea.  Bolus generously as needed up to q 15 minutes.  Will start GTT if it appears necessary.  Robinul x 1 for secretions then PRN.  Goals of Care and Additional Recommendations:  Limitations on Scope of Treatment: Avoid Hospitalization and Full Comfort Care  Code Status: DNR  Prognosis:   < 2 weeks due to recurrent aspiration, acute on chronic respiratory failure, chronic diastolic heart failure, chronic kidney disease and hepatic encephalopathy.  Discharge Planning:  Hospice facility if he stabilizes off bi-pap  Care plan was discussed with Family, RN, and PCCM MD  Thank you for allowing the Palliative Medicine Team to assist in the care of this patient.   Time In: 9:00 Time Out: 10:00 Total Time 60 min. Prolonged Time Billed no      Greater than  50%  of this time was spent counseling and coordinating care related to the above assessment and plan.  Imogene Burn, PA-C Palliative Medicine Pager: [phone]  Please contact Palliative Medicine Team phone at [phone] for questions and concerns.

## 2015-12-28 NOTE — Progress Notes (Signed)
Pt transferred via bed with O2 with belongings, family present and going up to unit waiting area 6N til pt is up in new room.

## 2015-12-28 NOTE — Progress Notes (Signed)
PT Cancellation Note  Patient Details Name: Christian [name] MRN: [id number] DOB: 21-Sep-1940   Cancelled Treatment:    Reason Eval/Treat Not Completed: Other (comment) (Pt being transferred to another unit. Will attempt in am as able. )Note pt may go to [place] in am as well.     Irwin Brakeman F 12/28/2015, 1:59 PM [facility] [phone] (pager)

## 2015-12-28 NOTE — Progress Notes (Signed)
Pt taken off Bipap at this time per MD, placed pt on 3L [place] tolerating well, family at bedside.

## 2015-12-28 NOTE — Care Management Important Message (Signed)
Important Message  Patient Details  Name: Christian [name] MRN: [id number] Date of Birth: July 14, 1940   Medicare Important Message Given:  Yes    [name] Abena 12/28/2015, 11:00 AM

## 2015-12-28 NOTE — Progress Notes (Signed)
PT Cancellation Note  Patient Details Name: Christian [name] MRN: [id number] DOB: 13-May-1941   Cancelled Treatment:    Reason Eval/Treat Not Completed: Medical issues which prohibited therapy (Pt on Bipap with high HR.  Nursing asked PT to check back.)Thanks.    Irwin Brakeman F 12/28/2015, 9:16 AM  [facility] [phone] (pager)

## 2015-12-28 NOTE — Progress Notes (Signed)
Attempted to call report to unit [place], nurse not available at this time, number given for nurse to call back. Pt will be transferred to room 6N27.

## 2015-12-29 DIAGNOSIS — Z515 Encounter for palliative care: Secondary | ICD-10-CM | POA: Insufficient documentation

## 2015-12-29 LAB — CULTURE, BLOOD (ROUTINE X 2)
CULTURE: NO GROWTH
CULTURE: NO GROWTH

## 2015-12-29 LAB — GLUCOSE, CAPILLARY: GLUCOSE-CAPILLARY: 84 mg/dL (ref 65–99)

## 2015-12-29 MED ORDER — GLYCOPYRROLATE 0.2 MG/ML IJ SOLN: 0.2000 mg | Freq: Two times a day (BID) | INTRAMUSCULAR | Status: DC

## 2015-12-30 ENCOUNTER — Telehealth: Payer: Self-pay

## 2015-12-30 NOTE — Telephone Encounter (Signed)
On 12/30/2015 I received a death certificate from [facility] (faxed). The death certificate is for cremation. The patient is a patient of Doctor Titus Mould. The death certificate will be taken to [facility]) this pm for signature. On 2016-01-02 I received the death certificate back from Doctor Titus Mould. I got the death certificate ready and faxed the death certificate to the funeral home per their request.

## 2016-01-01 ENCOUNTER — Telehealth: Payer: Self-pay

## 2016-01-01 NOTE — Telephone Encounter (Signed)
On 01/01/2016 I received a death certificate from [facility] (orginal). The death certificate is for cremation. The patient is a patient of Doctor Titus Mould.. The death certificate will be taken to Steffanie Rainwater [facility]) this am for signature. On 02-05-16 I received the death certificate back from Doctor Titus Mould. I got the death certificate ready and called the funeral home to let them know I mailed the death certificate to the [facility] per the funeral home request.

## 2016-01-04 ENCOUNTER — Ambulatory Visit: Payer: Medicare HMO

## 2016-01-05 ENCOUNTER — Telehealth: Payer: Self-pay | Admitting: Family Medicine

## 2016-01-05 NOTE — Telephone Encounter (Signed)
Called pt's daughter to offer my condolences.   She thanked me for the call.  It was a privilege helping care for this kind gentleman.  I am grateful to all involved in his care, especially his children for their efforts through the years.

## 2016-01-07 ENCOUNTER — Ambulatory Visit: Payer: Medicare HMO | Admitting: Internal Medicine

## 2016-01-09 NOTE — Progress Notes (Signed)
   01/20/2016 1000  Clinical Encounter Type  Visited With Patient and family together  Visit Type Follow-up;Spiritual support;Patient actively dying  Referral From Family  Spiritual Encounters  Spiritual Needs Emotional  Stress Factors  Patient Stress Factors Exhausted;Family relationships;Health changes  Family Stress Factors Exhausted;Health changes   Chaplain visited with family. Pt unresponsive and resting.  Chaplain offered ministry of presence to family. Leonard Downing had already visited pt and prayed with them.  Chaplain Lattie Haw offered prayer to family as well.  Vilinda Blanks Ennis Heavner 2016-01-20  10:34 AM

## 2016-01-09 NOTE — Progress Notes (Signed)
PULMONARY / CRITICAL CARE MEDICINE   Name: Christian [name] MRN: [id number] DOB: 01/22/1941    ADMISSION DATE:  12/26/2015 CONSULTATION DATE:  12/24/2015  REFERRING MD:  EDP  CHIEF COMPLAINT:  Acute Hypoxic Respiratory Failure  HISTORY OF PRESENT ILLNESS:   Per the electronic medical record patient became increasingly lethargic and "wasn't acting like himself". Patient was unable to hold a fork dinner. Family called EMS. On arrival patient was saturating 84% on room air and drooling. Patient was unresponsive to sternal rub.. Patient was subsequently intubated in the emergency department after arrival and assessment by emergency department physician. Patient's family reports that he was hospitalized in February for aspiration pneumonia. Over the last month they have noticed a gradual decline with increasing fatigue and dyspnea on exertion. Patient had been compliant with his diuretic medications but family do report increasing edema that they know today. The patient has also had his Lopressor adjusted recently as well for persistent bradycardia. At approximately 5 PM patient was noted to have decreased level of consciousness and incontinence but was able to respond. Family reports that after contacting him later in the evening his altered mentation became evident with repeated dropping of the phone. Patient's family reports that they had planned to discuss palliative medicine at appointment with his general practitioner tomorrow.   SUBJECTIVE Dying  VITAL SIGNS: BP 127/59 mmHg  Pulse 124  Temp(Src) 98.9 F (37.2 C) (Oral)  Resp 24  Ht 5\' 10"  (1.778 m)  Wt 209 lb (94.802 kg)  BMI 29.99 kg/m2  SpO2 99%  HEMODYNAMICS:    VENTILATOR SETTINGS: Vent Mode:  [-]  FiO2 (%):  [40 %] 40 %  INTAKE / OUTPUT: I/O last 3 completed shifts: In: 390 [P.O.:250; I.V.:140] Out: 1600 [Urine:1600]  PHYSICAL EXAMINATION: General:.Agonal. HEENT:  No scleral injection or icterus. No JVD Cardiovascular:  irreg, + murmur   Pulmonary:  Agonal resp Abdomen: Soft. Normal bowel sounds. Nondistended. Musculoskeletal:  Normal bulk. No joint deformity or effusion appreciated. Neurological:On mso4 drip and unresponsive. Actively dying LABS:  BMET  Recent Labs Lab 12/25/15 0249 12/26/15 0320 12/27/15 0312  NA 132* 133* 136  K 3.4* 3.9 4.1  CL 91* 98* 101  CO2 29 28 28   BUN 16 14 12   CREATININE 1.13 1.12 0.99  GLUCOSE 380* 98 70    Electrolytes  Recent Labs Lab 12/24/15 0527 12/25/15 0249 12/26/15 0320 12/27/15 0312  CALCIUM 7.4* 7.5* 7.8* 8.1*  MG 1.7 1.7  --   --   PHOS <1.0* 1.8*  --   --     CBC  Recent Labs Lab 12/24/15 0527 12/25/15 0249 12/27/15 0312  WBC 8.4 7.7 7.4  HGB 10.0* 9.8* 12.1*  HCT 35.0* 33.2* 40.4  PLT 131* 142* 153    Coag's  Recent Labs Lab 12/24/15 0527  APTT 45*  INR 4.80*    Sepsis Markers  Recent Labs Lab 01/04/2016 2251 12/24/15 0105 12/25/15 0249 12/26/15 0320  LATICACIDVEN 0.81  --   --   --   PROCALCITON  --  <0.10 <0.10 <0.10    ABG  Recent Labs Lab 12/16/2015 2338 12/24/15 0415 12/28/15 0340  PHART 7.520* 7.557* 7.169*  PCO2ART 48.8* 40.8 86.0*  PO2ART 510.0* 224* 72.0*    Liver Enzymes  Recent Labs Lab 12/31/2015 2230 12/24/15 0527  AST 29  --   ALT 19  --   ALKPHOS 80  --   BILITOT 0.9  --   ALBUMIN 3.9 2.2*    Cardiac Enzymes  Recent Labs Lab 12/24/15 0527 12/24/15 1410  TROPONINI 0.08* 0.14*    Glucose  Recent Labs Lab 12/27/15 0959 12/27/15 1213 12/27/15 1633 12/27/15 2007 12/27/15 2157 12/28/15 1138  GLUCAP 81 90 118* 146* 146* 89    Imaging No results found.   STUDIES:  PORT CXR 6/14:  Endotracheal tube in acceptable position. Low lung volumes. Blunting of left costophrenic angle & questionable right midlung opacity. CT HEAD W/O 6/15 >>neg TTE 6/15 >>  MICROBIOLOGY: Blood Ctx x2 6/14 >> Urine Ctx 6/14 >>neg MRSA PCR 6/14 >> Tracheal Asp Ctx 6/14 >> neg    ANTIBIOTICS: Cefepime 6/14 >>off Vancomycin 6/14 >>off  SIGNIFICANT EVENTS: 6/14 - Admit 6/19 mso4 drip  LINES/TUBES: OETT 7.5 6/14 >> Foley 6/14 >> PIV x3  DISCUSSION:   75 year old male with acute on chronic hypoxic and hypercarbic respiratory failure. Unclear etiology as patient's BNP does not appear above baseline. Lengthy discussion with the patient's family at bedside regarding goals of care. Extubated 6/18 doing OK, no distress. Plan today is to resume home meds, transition to his home BIPAP and hope for the best. I have asked him to reconsider home hospice. Currently on MSO4 drip and agonal.  ASSESSMENT / PLAN:  PULMONARY A: Acute on Chronic Hypoxic Respiratory Failure - Baseline 2 L/m requirement. Chronic Hypercarbic Respiratory Failure - Previously followed with [facility]. On nocturnal NIPPV. Possible Aspiration Pneumonia  P:   D/C BiPAP Supplemental O2 for comfort.  CARDIOVASCULAR A:  H/O Chronic Diastolic CHF H/O Hyperlipidemia H/O HTN H/O Chronic A fib - On Xarelto. H/O CAD - S/P 3 Vessel CABG 2009.  P:  D/C tele monitoring.  RENAL  A:   H/O CKD Stage III  P:   D/C blood draws.  GASTROINTESTINAL A:   No acute issues.  P:   Liberalize diet for comfort.  HEMATOLOGIC A:   No acute issues. Systemic Anticoagulation - With Xarelto.  P:  D/C anti-coagulation.  INFECTIOUS A:   Possible Aspiration Pneumonia  P:   D/C abx.  ENDOCRINE Hypoglycemia A:   H/O DM Type II  P:   D/C CBGs and ISS.  NEUROLOGIC A:   Acute Encephalopathy , more awake 6/16-->resolved H/O Hepatic Encephalopathy  P:   Change focus to comfort. Morphine and robinul per palliative.  FAMILY  Family all present bedside, spoke with them and with Imogene Burn from palliative.  They all feel that the patient has had enough over the past 5 years and he is in a place where he would just rather be comfortable.  Plan is to d/c BiPAP and transfer patient to 6N.   The  patient is actively dying on MSO4 drip doubt he will survive the day.  Richardson Landry Minor ACNP Maryanna Shape PCCM Pager [phone] till 3 pm If no answer page [phone] 01/15/16, 8:38 AM   STAFF NOTE: I, Merrie Roof, MD FACP have personally reviewed patient's available data, including medical history, events of note, physical examination and test results as part of my evaluation. I have discussed with resident/NP and other care providers such as pharmacist, RN and RRT. In addition, I personally evaluated patient and elicited key findings of: agonal resp , no distress, amazingly on 2 mg morphine infusion, ronchi, edema, I updated family in full, they are also considering dc 3 liters O2, he is unlikely to surivive the day, continued moprhine to rr 12-18 and comfort    Lavon Paganini. Titus Mould, MD, [place] Pgr: [facility] 2016-01-15 12:04 PM

## 2016-01-09 NOTE — Progress Notes (Signed)
Daily Progress Note   Patient Name: Christian [name]       Date: January 14, 2016 DOB: 08-19-1940  Age: 75 y.o. MRN#: [id number] Attending Physician: Rush Farmer, MD Primary Care Physician: [name], MD Admit Date: 01/07/2016  Reason for Consultation/Follow-up: Terminal Care  Subjective: Patient unable.  Son and daughter at bedside.  Daughter reports he started to have a significant decline at about 6 pm last evening.  He hasn't moved or spoken since.  Length of Stay: 5  Current Medications: Scheduled Meds:  . antiseptic oral rinse  7 mL Mouth Rinse q12n4p  . chlorhexidine  15 mL Mouth Rinse BID  . glycopyrrolate  0.2 mg Intravenous BID    Continuous Infusions: . sodium chloride 0 mL/hr at 12/27/15 1438  . morphine 1 mg/hr (12/28/15 1738)    PRN Meds: sodium chloride, antiseptic oral rinse, diphenhydrAMINE, glycopyrrolate **OR** glycopyrrolate **OR** glycopyrrolate, haloperidol **OR** haloperidol **OR** haloperidol lactate, morphine, ondansetron **OR** ondansetron (ZOFRAN) IV, polyvinyl alcohol  Physical Exam     Frail ill appearing male.  Patient unconscious with weak pulse and irregular shallow breaths     Lower extremities:  3+ edema.  Vital Signs: BP 127/59 mmHg  Pulse 124  Temp(Src) 98.9 F (37.2 C) (Oral)  Resp 24  Ht 5\' 10"  (1.778 m)  Wt 94.802 kg (209 lb)  BMI 29.99 kg/m2  SpO2 99% SpO2: SpO2: 99 % O2 Device: O2 Device: Nasal Cannula O2 Flow Rate: O2 Flow Rate (L/min): 3 L/min  Intake/output summary:   Intake/Output Summary (Last 24 hours) at 14-Jan-2016 0757 Last data filed at 01-14-2016 0222  Gross per 24 hour  Intake    270 ml  Output    975 ml  Net   -705 ml   LBM: Last BM Date:  (PTA ? ) Baseline Weight: Weight: 90.719 kg (200 lb) Most recent weight:  Weight: 94.802 kg (209 lb)       Palliative Assessment/Data:    Flowsheet Rows        Most Recent Value   Intake Tab    Referral Department  Critical care   Unit at Time of Referral  ICU   Palliative Care Primary Diagnosis  Cardiac   Date Notified  12/26/15   Palliative Care Type  New Palliative care   Reason for referral  Clarify  Goals of Care, Counsel Regarding Hospice   Date of Admission  12/17/2015   Date first seen by Palliative Care  12/26/15   # of days Palliative referral response time  0 Day(s)   # of days IP prior to Palliative referral  3   Clinical Assessment    Palliative Performance Scale Score  10%   Pain Max last 24 hours  Not able to report   Pain Min Last 24 hours  Not able to report   Dyspnea Max Last 24 Hours  Not able to report   Dyspnea Min Last 24 hours  Not able to report   Nausea Max Last 24 Hours  Not able to report   Nausea Min Last 24 Hours  Not able to report   Anxiety Max Last 24 Hours  Not able to report   Anxiety Min Last 24 Hours  Not able to report   Other Max Last 24 Hours  Not able to report   Psychosocial & Spiritual Assessment    Palliative Care Outcomes    Who was at the meeting?  son and dtr   Palliative Care Outcomes  Improved pain interventions, Improved non-pain symptom therapy, Clarified goals of care, Counseled regarding hospice, Transitioned to hospice   Patient/Family wishes: Interventions discontinued/not started   Mechanical Ventilation, Trach, NIPPV, Hemodialysis, PEG, Vasopressors, Tube feedings/TPN   Palliative Care follow-up planned  Yes, Facility      Patient Active Problem List   Diagnosis Date Noted  . Encounter for hospice care discussion   . Dyspnea   . Goals of care, counseling/discussion   . HCAP (healthcare-associated pneumonia)   . Palliative care encounter   . Physical deconditioning   . Acute on chronic respiratory failure with hypoxia ([place]) 12/24/2015  . Hypoxia   . Respiratory failure ([place])   .  Hypertensive heart disease   . Type II diabetes mellitus ([place])   . Acute respiratory failure with hypoxia ([place])   . Atrial fibrillation with rapid ventricular response ([place])   . CAP (community acquired pneumonia)   . Sepsis ([place])   . Acute on chronic respiratory failure with hypoxia and hypercapnia ([facility])   . Pneumonia 10/22/2015  . Medicare annual wellness visit, initial 05/21/2015  . Advance care planning 05/21/2015  . 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 Fatigue 11/21/2014  . AK (actinic keratosis) 11/03/2014  . Benign paroxysmal positional vertigo 01/16/2014  . Orthostatic hypotension 04/03/2013  . 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 Anemia 06/19/2012  . Chronic diastolic heart failure ([place]) 05/11/2012  . Neck pain 01/30/2012  . Chronic respiratory failure ([place]) 01/17/2012  . Leg weakness 01/17/2012  . Hip pain 10/17/2011  . Ingrown toenail 10/17/2011  . Hypercarbia 10/14/2011  . Esophagitis 09/11/2011  . Dysphagia 07/21/2011  . Diastolic CHF, acute ([place]) 06/28/2011  . Weight loss, non-intentional 06/28/2011  . Mental confusion 06/28/2011  . Back pain 12/07/2010  . Hyperglycemia 12/07/2010  . LEFT VENTRICULAR FAILURE 05/19/2010  . EDEMA 04/12/2010  . SHORTNESS OF BREATH 03/18/2010  . Hyperlipidemia 03/03/2010  . HYPERTENSION, BENIGN 03/03/2010  . CAD (coronary artery disease) of artery bypass graft 03/03/2010  . ATRIAL FIBRILLATION 03/03/2010    Palliative Care Assessment & Plan   Patient Profile:  75 y.o. male with past medical history of Coronary artery disease, CABG, diabetes, hyperlipidemia, diastolic heart failure, atrial fibrillation with RVR, hepatic encephalopathy, hypoventilation syndrome on BiPAP at home, history of CO2 retention, history of aspiration pneumonia admitted on 12/28/2015 with decreased level of consciousness,  incontinence, and altered mental status. Upon arrival to the emergency room he was saturating at 84% on room air and was drooling. He  was unresponsive to a sternal rub. Subsequently he was intubated in the emergency department. Per family patient has been declining since his admission in April for aspiration pneumonia. Patient lives alone and has been requiring more help in the house. They've noted that he is too weak to even fix coffee, often times is not leaving the bed, and has only been eating bites and sips since that time.. Patient was extubated 12/26/2015. While intubated he has required Precedex for agitation Kaylyn Lim amiodarone for chronic atrial fib. Husband if heart and amiodarone was stopped as well as hyp0rtensive. Family shares prior to being admitted to the hospital they had planned to meet with his primary care provider to discuss advance directives given his sharp decline since April. Patient was admitted with acute on chronic hypoxic as well as hypercarbic respiratory failure and possible aspiration pneumonia   Assessment: Patient is near death.  Expect him to pass within 12 - 24 hours  Recommendations/Plan:  DNR/DNI  Comfort measures  Morphine gtt.  Seems to be very effective at 1 mg per hour  Would wean from oxygen today.  Hospital death anticipated.  Unstable for transport.  Goals of Care and Additional Recommendations:  Limitations on Scope of Treatment: Full Comfort Care  Code Status: DNR    Prognosis:   Hours - Days  Discharge Planning:  Anticipated Hospital Death  Care plan was discussed with daughter at bedside.  Thank you for allowing the Palliative Medicine Team to assist in the care of this patient.   Time In: 7:30 Time Out: 7:50 Total Time 20 min Prolonged Time Billed no      Greater than 50%  of this time was spent counseling and coordinating care related to the above assessment and plan.  Imogene Burn, PA-C Palliative Medicine Pager: [phone]  Please contact Palliative Medicine Team phone at [phone] for questions and concerns.

## 2016-01-09 NOTE — Discharge Summary (Signed)
NAMEXAZIER, KOCHAN NO.:  [id number]  MEDICAL RECORD NO.:  [id number]  LOCATION:  6N27C                        FACILITY:  [facility]  PHYSICIAN:  Raylene Miyamoto, MD DATE OF BIRTH:  1941/03/14  DATE OF ADMISSION:  12/24/2015 DATE OF DISCHARGE:  01/10/2016                              DISCHARGE SUMMARY   DEATH SUMMARY  This is a 75 year old male with a past medical history of chronic hypoxemic respiratory failure who was found to be at home with oxygen desaturation, was not acting himself.  Family called EMS.  He was found to be hypoxic, required intubation, [facility] to the [facility].  There were complaints of prior aspiration pneumonia in February.  He had increasing edema in his lower extremities, requiring Lasix adjustments.  Hospitalization included intubation in intensive care unit.  Cultures were essentially negative.  Head CT was negative on December 24, 2015.  He was treated with cefepime and vancomycin in concerns of aspiration pneumonia, and eventually just was not progressing, family was clear on wishes to have comfort care provided, comfort care was provided, and the patient expired.  FINAL DIAGNOSES UPON DEATH:  Acute on chronic respiratory failure with a baseline 2 L home oxygen dependence.  Chronic hypercarbic respiratory failure, previously seen by Dr. Gwenette Greet, on nocturnal noninvasive ventilation, likely aspiration pneumonia.  History of chronic diastolic heart failure.  History of hyperlipidemia.  History of hypertension. History of chronic atrial fibrillation.  History of coronary artery disease.  History of chronic kidney disease.  Comfort care.     Raylene Miyamoto, MD     DJF/MEDQ  D:  12/31/2015  T:  12/31/2015  Job:  [id number]

## 2016-01-09 NOTE — Progress Notes (Signed)
[facility] responded to end of life consult. I met beside with the family. I initiated the ministry of remembrance and celebration of the patients life. I offered a prayer of thanksgiving for the influence he had provided through the years. I will be available for follow up as needed or desired.  Christian [name] Julia 9:54 AM    01/10/16 0900  Clinical Encounter Type  Visited With Family;Patient not available  Visit Type Initial;Spiritual support;Patient actively dying  Consult/Referral To Nurse  Spiritual Encounters  Spiritual Needs Prayer;Emotional;Grief support  Stress Factors  Family Stress Factors Exhausted;Health changes;Loss;Major life changes

## 2016-01-09 NOTE — Progress Notes (Signed)
Morphine sulfate soln. Amount of 225 mls wasted with nurse Ludwig Clarks.

## 2016-01-09 NOTE — Progress Notes (Signed)
225 mls of morphine wasted with Derald Macleod

## 2016-01-09 DEATH — deceased

## 2016-01-22 ENCOUNTER — Other Ambulatory Visit: Payer: Self-pay | Admitting: Family Medicine

## 2016-02-03 ENCOUNTER — Ambulatory Visit: Payer: Medicare HMO | Admitting: Cardiovascular Disease

## 2016-06-25 ENCOUNTER — Other Ambulatory Visit: Payer: Self-pay | Admitting: Nurse Practitioner

## 2018-02-02 IMAGING — DX DG CHEST 1V PORT
1 series · 1 of 1 positions shown · non-contrast
Comparison: 10/22/2015.

CLINICAL DATA: Acute respiratory failure.

EXAM:
PORTABLE CHEST 1 VIEW

[chest ap]
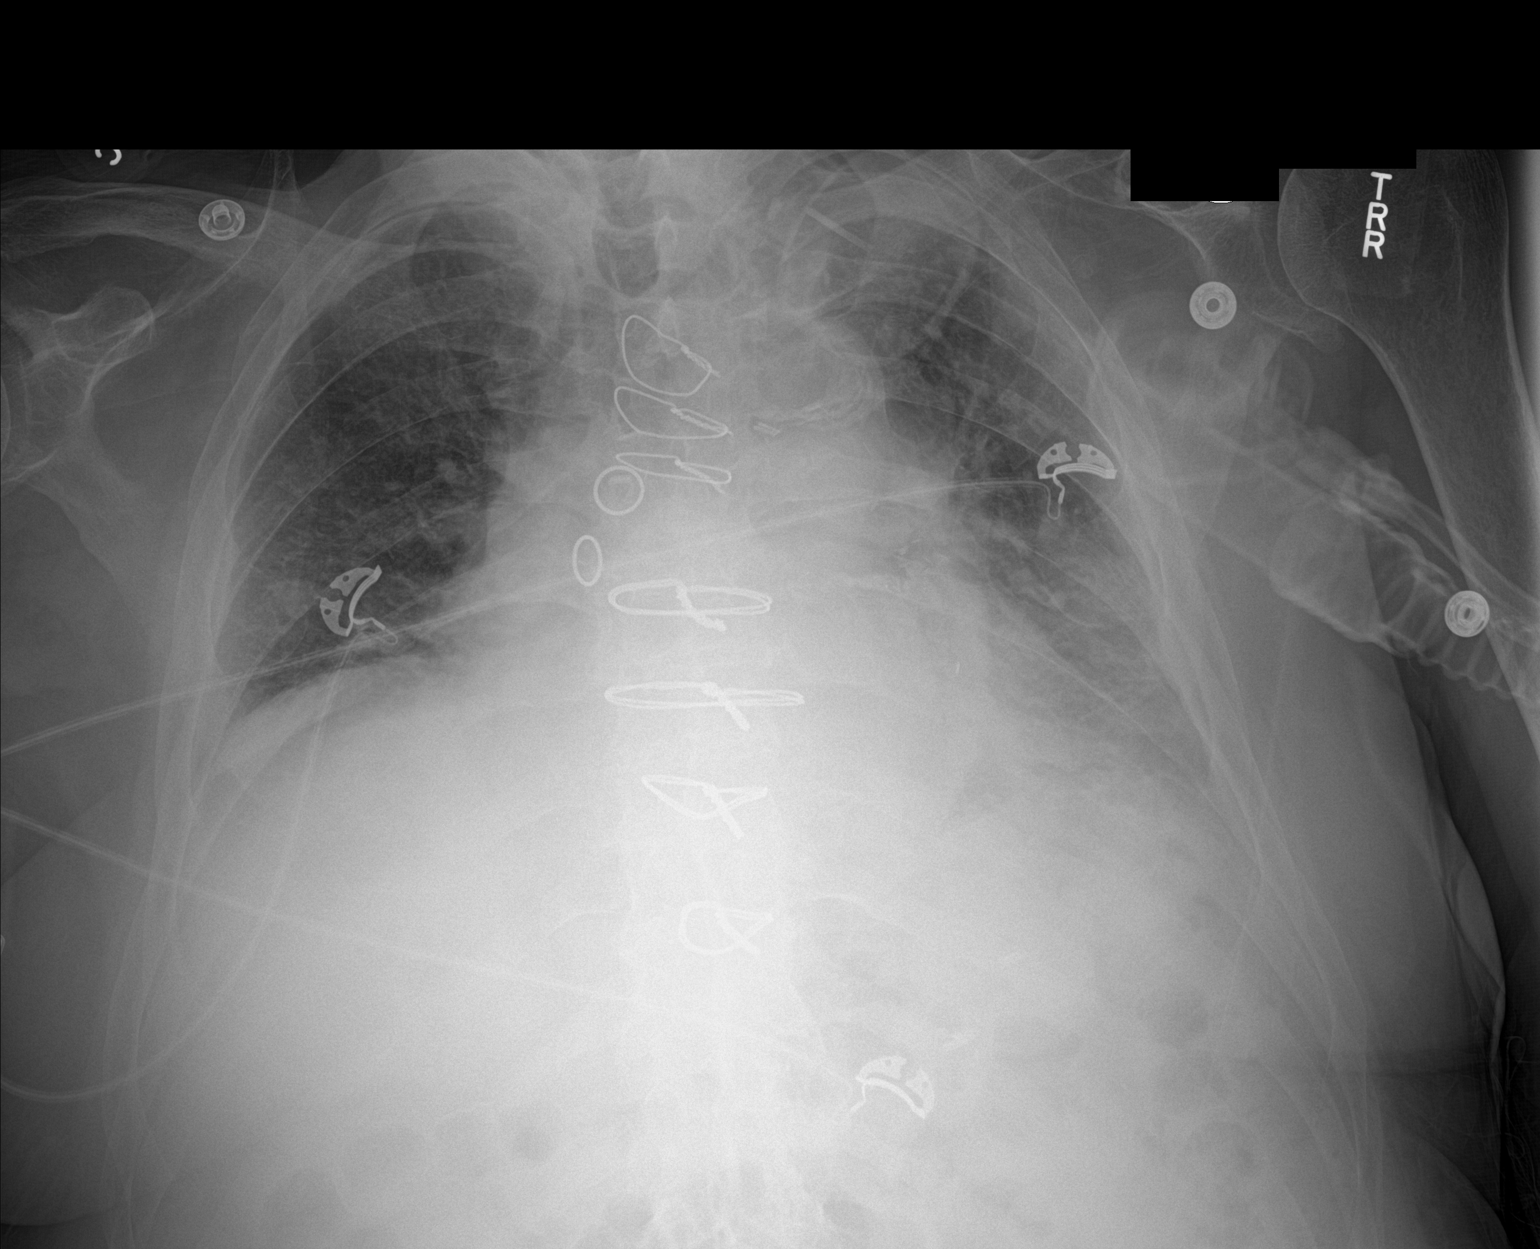

[1 of 1 positions shown; findings below may reference images not displayed]

FINDINGS: A poor inspiration is again is demonstrated with stable enlarged
cardiac silhouette and post CABG changes. Stable prominence of the
interstitial markings, mild patchy opacity at the left lung base and
possible small left pleural effusion. Mild left shoulder
degenerative changes.
IMPRESSION: 1. Stable mild left basilar atelectasis or pneumonia and possible
minimal left pleural effusion.
2. Stable cardiomegaly and mild chronic interstitial lung disease.
